# Patient Record
Sex: Male | Born: 1965 | Race: Black or African American | Hispanic: No | Smoking: Current every day smoker
Health system: Southern US, Community
[De-identification: ages and names within clinical notes are randomized; demographics above are authoritative.]

## PROBLEM LIST (undated history)

## (undated) DIAGNOSIS — I10 Essential (primary) hypertension: Secondary | ICD-10-CM

## (undated) DIAGNOSIS — G4730 Sleep apnea, unspecified: Secondary | ICD-10-CM

## (undated) DIAGNOSIS — F99 Mental disorder, not otherwise specified: Secondary | ICD-10-CM

## (undated) DIAGNOSIS — R569 Unspecified convulsions: Secondary | ICD-10-CM

## (undated) DIAGNOSIS — F419 Anxiety disorder, unspecified: Secondary | ICD-10-CM

## (undated) DIAGNOSIS — I469 Cardiac arrest, cause unspecified: Secondary | ICD-10-CM

## (undated) DIAGNOSIS — I219 Acute myocardial infarction, unspecified: Secondary | ICD-10-CM

## (undated) DIAGNOSIS — J189 Pneumonia, unspecified organism: Secondary | ICD-10-CM

## (undated) DIAGNOSIS — F4310 Post-traumatic stress disorder, unspecified: Secondary | ICD-10-CM

## (undated) DIAGNOSIS — K859 Acute pancreatitis without necrosis or infection, unspecified: Secondary | ICD-10-CM

## (undated) DIAGNOSIS — M6282 Rhabdomyolysis: Secondary | ICD-10-CM

## (undated) DIAGNOSIS — F209 Schizophrenia, unspecified: Secondary | ICD-10-CM

## (undated) DIAGNOSIS — N179 Acute kidney failure, unspecified: Secondary | ICD-10-CM

## (undated) DIAGNOSIS — R0602 Shortness of breath: Secondary | ICD-10-CM

## (undated) HISTORY — PX: TONSILLECTOMY: SUR1361

---

## 1998-12-04 ENCOUNTER — Emergency Department (HOSPITAL_COMMUNITY): Admission: EM | Admit: 1998-12-04 | Discharge: 1998-12-04 | Payer: Self-pay | Admitting: Emergency Medicine

## 2000-06-16 ENCOUNTER — Ambulatory Visit (HOSPITAL_COMMUNITY): Admission: RE | Admit: 2000-06-16 | Discharge: 2000-06-17 | Payer: Self-pay | Admitting: *Deleted

## 2000-06-16 HISTORY — PX: UVULOPALATOPHARYNGOPLASTY, TONSILLECTOMY AND SEPTOPLASTY: SHX2632

## 2006-09-15 ENCOUNTER — Encounter: Admission: RE | Admit: 2006-09-15 | Discharge: 2006-09-15 | Payer: Self-pay | Admitting: Occupational Medicine

## 2007-09-18 ENCOUNTER — Emergency Department (HOSPITAL_COMMUNITY): Admission: EM | Admit: 2007-09-18 | Discharge: 2007-09-18 | Payer: Self-pay | Admitting: Emergency Medicine

## 2007-10-21 ENCOUNTER — Emergency Department: Payer: Self-pay | Admitting: Emergency Medicine

## 2007-11-08 ENCOUNTER — Emergency Department (HOSPITAL_COMMUNITY): Admission: EM | Admit: 2007-11-08 | Discharge: 2007-11-09 | Payer: Self-pay | Admitting: Emergency Medicine

## 2011-03-04 NOTE — Op Note (Signed)
[place]. [facility]  Patient:    Ross [name], Ross [name]                  MRN: [id number] Proc. Date: 06/16/00 Adm. Date:  [id number] Disc. Date: [id number] Attending:  Carlena Sax CCArvella Merles, M.D.   Operative Report  PREOPERATIVE DIAGNOSES:  Obstructive sleep apnea, nasal airway obstruction, nasal septal deviation, inferior turbinate hypertrophy, tonsillar hypertrophy and elongated soft palate and uvula.  POSTOPERATIVE DIAGNOSIS:  Obstructive sleep apnea, nasal airway obstruction, nasal septal deviation, inferior turbinate hypertrophy, tonsillar hypertrophy and elongated soft palate and uvula.  PROCEDURE:  Tonsillectomy and uvulopalatopharyngoplasty, nasal septal reconstruction, intramural cauterization of both inferior turbinates.  SURGEON:  General endotracheal anesthesia.  INDICATIONS FOR SURGERY:  This is a 45 year old black male with a history of mild obstructive sleep apnea diagnosed by sleep test performed on August 21, 1997.  He has been using nasal CPAP for the past 6 months but has not tolerated it very well and has stopped using this.  He does complain of loud nasal congestion and loud snoring at night.  Physical examination does reveal a septal deviation right, congestion of the inferior turbinates, moderate to severe tonsillar hypertrophy and elongated soft palate and uvula.  Based on his history and physical examination I have recommended proceeding with the above noted surgical procedure.  I have discussed extensively with him the risks and benefits of surgery including risks of general anesthesia, infection and bleeding, uvulopharyngeal insufficiency and possible septal perforation and a normal recovery period after this type of surgery.  I have entertained questions, answered them appropriately and informed consent has been obtained and patient presents for the above noted procedure.  OPERATIVE FINDINGS:  Septal  deviation right, inferior turbinate congestion hypertrophy, moderate tonsillar hypertrophy, and elongated soft palate and uvula.  DETAILS OF PROCEDURE PERFORMED:  The patient was brought to the operating room and placed in supine position.  General endotracheal anesthesia administered via the anesthesiologist without complication.  The patient was administered 1 gram of Ancef IV x 1 and 10 mg of Decadron IV x 1.   The head of the table is turned 90 degrees.  The patients face is draped in standard fashion.  A Crowe-Davis mouth retractor inserted in the oral cavity.  This was used to retract the mouth open.  First attention was turned to the right tonsil.  A curved Allis clamp used to grasp the tonsil and retracted medially.  A Harmonic scalpel was then used to dissect the tonsil free from the tonsillar fossa and bleeding was controlled with a combination of suction cautery and harmonic scalpel without difficulty.  The tonsil was then transected at the tongue base and removed through the oral cavity and sent to surgical pathology for permanent section analysis.  Next, the attention was turned to the left tonsil.  Identical procedure was carried on this side compared to the right side with identical results.  After this was done bleeding from both tonsillar fossas was controlled meticulously with suction cautery without difficulty.  The uvula and portion of the soft palate was then transected horizontally in such a fashion leaving a soft palate behind to prevent uvulopharyngeal insufficiency.  This was done in a fashion to make a posterior based trap door flap using the harmonic scalpel without difficulty.  The uvula and a portion of the soft palate was then removed and sent to surgical pathology for permanent section analysis.  The cut edges of the soft palate and anterior and posterior tonsillar pillars were reapproximated with interrupted 3-0 Vicryl suture.  A total of 5 cc of 0.50%  Marcaine solution and 1:200,000 epinephrine were infiltrated in the soft palate, anterior tonsillar pillar and tongue base bilaterally.  A throat pack was placed in the hypopharynx and the Crowe-Davis mouth retractor was released and brought out through the oral cavity without incident.  Next, attention was turned to the nasal chamber.   Both sides of the septum were packed and the nasal chambers were packed with cottonoid soaked in some 4% cocaine solution.  These were left in place for approximately 5 to 10 minutes and then removed.  Both sides of the septum were infiltrated with a 1% lidocaine solution with 1:100,000 epinephrine.  After awaiting approximately 10 minutes a standard Killian incision was made on the left side of the septum.  Mucoperichondrium and mucoperiosteal flap was elevated on the left side using both blunt and sharp dissection.  A intercartilaginous incision was made approximately 1 cm posterior from the caudal end of the septum.  Mucoperichondrium and mucoperiosteal flap was elevated on the right side using both blunt and sharp dissection.  Cartilaginous deviation was removed with a Ballenger swivel knife.  The posterior bony deflection was removed with a Jansen-Middleton forceps and the septal spur which was pushing off of the left nasal chamber was removed with the Jansen-Middleton forceps without difficulty.  A piece of trimmed morcellized cartilage was placed in between the septal flaps.  Septal incision was closed with interrupted chromic suture and the septums reinforced with a running transeptal plain gut mattress stitch.  Both inferior turbinates were injected with a total of 6 cc of a 1% lidocaine solution and 1:100,000 epinephrine.  Both inferior turbinates were then intramurally cauterized using the Elmed intermedullary cauterization unit set at a 12 watt setting.  Three passes of both inferior turbinates was performed without difficulty and both  inferior turbinates were outfractured using gentle lateral pressure with a large nasal speculum.   A Doyle splint soaked in a Bactroban ointment solution was placed on either side of the septum and held in place with a transeptal Prolene suture.  Throat pack was removed, and the orogastric tube was placed and this was used to decompress the stomach contents and it was then removed without incident.  FLUIDS GIVEN DURING PROCEDURE:  Approximately 1 liter of crystalloid.  ESTIMATED BLOOD LOSS:  Less than 50 cc.  URINE:  Not measured.  DRAINS:  No drains.  SPLINTS:  Two Doyle splints were placed.  SPECIMENS SENT:  Septal cartilage and bone for gross pathology only, tonsils x 2, uvula and a portion of soft palate.  The patient tolerated the procedure well without complications and was extubated in the operating room and transferred to recovery room in stable condition.  Sponge, instrument and needle counts correct at the end of the procedure.  TOTAL DURATION OF PROCEDURE:  Approximately 2 hours.  The patient will be admitted for overnight recovery.  If he has recovered well he will be sent home on June 17, 2000.  He will be sent home on Augmentin elixir p.o. t.i.d. for 2 weeks and Lortab elixir 250 cc with 2 refills, 10-15 cc p.o. q.4h. p.r.n. pain.  He is to have light activity.  No heavy lifting or nose blowing for 2 weeks and a post tonsillar diet for 2 weeks.  Both him and his family are given oral and written instructions.  They are to call if he has any problems with bleeding, fever, or vomiting, pain, reaction to medications or any other questions.  He will followup for splint removal on Thursday, June 22, 2000, at 3:50 p.m. DD:  06/16/00 TD:  06/17/00 Job: [id number]

## 2011-08-23 ENCOUNTER — Inpatient Hospital Stay (HOSPITAL_COMMUNITY)
Admission: EM | Admit: 2011-08-23 | Discharge: 2011-09-01 | DRG: 917 | Disposition: A | Discharge status: Home Health | Payer: Non-veteran care | Attending: Internal Medicine | Admitting: Internal Medicine

## 2011-08-23 ENCOUNTER — Inpatient Hospital Stay (HOSPITAL_COMMUNITY): Payer: Non-veteran care

## 2011-08-23 ENCOUNTER — Encounter (HOSPITAL_COMMUNITY): Payer: Self-pay | Admitting: Anesthesiology

## 2011-08-23 ENCOUNTER — Other Ambulatory Visit: Payer: Self-pay

## 2011-08-23 ENCOUNTER — Encounter: Payer: Self-pay | Admitting: *Deleted

## 2011-08-23 DIAGNOSIS — T443X1A Poisoning by other parasympatholytics [anticholinergics and antimuscarinics] and spasmolytics, accidental (unintentional), initial encounter: Principal | ICD-10-CM | POA: Diagnosis present

## 2011-08-23 DIAGNOSIS — J9600 Acute respiratory failure, unspecified whether with hypoxia or hypercapnia: Secondary | ICD-10-CM | POA: Diagnosis present

## 2011-08-23 DIAGNOSIS — G9349 Other encephalopathy: Secondary | ICD-10-CM | POA: Diagnosis present

## 2011-08-23 DIAGNOSIS — I472 Ventricular tachycardia, unspecified: Secondary | ICD-10-CM | POA: Diagnosis present

## 2011-08-23 DIAGNOSIS — D6959 Other secondary thrombocytopenia: Secondary | ICD-10-CM | POA: Diagnosis present

## 2011-08-23 DIAGNOSIS — I469 Cardiac arrest, cause unspecified: Secondary | ICD-10-CM

## 2011-08-23 DIAGNOSIS — R0902 Hypoxemia: Secondary | ICD-10-CM

## 2011-08-23 DIAGNOSIS — R21 Rash and other nonspecific skin eruption: Secondary | ICD-10-CM | POA: Diagnosis present

## 2011-08-23 DIAGNOSIS — L309 Dermatitis, unspecified: Secondary | ICD-10-CM | POA: Insufficient documentation

## 2011-08-23 DIAGNOSIS — J9690 Respiratory failure, unspecified, unspecified whether with hypoxia or hypercapnia: Secondary | ICD-10-CM

## 2011-08-23 DIAGNOSIS — I4901 Ventricular fibrillation: Secondary | ICD-10-CM

## 2011-08-23 DIAGNOSIS — R739 Hyperglycemia, unspecified: Secondary | ICD-10-CM | POA: Diagnosis present

## 2011-08-23 DIAGNOSIS — D696 Thrombocytopenia, unspecified: Secondary | ICD-10-CM | POA: Diagnosis present

## 2011-08-23 DIAGNOSIS — L259 Unspecified contact dermatitis, unspecified cause: Secondary | ICD-10-CM | POA: Diagnosis present

## 2011-08-23 DIAGNOSIS — E876 Hypokalemia: Secondary | ICD-10-CM | POA: Diagnosis not present

## 2011-08-23 DIAGNOSIS — F4310 Post-traumatic stress disorder, unspecified: Secondary | ICD-10-CM | POA: Diagnosis present

## 2011-08-23 DIAGNOSIS — D649 Anemia, unspecified: Secondary | ICD-10-CM | POA: Diagnosis present

## 2011-08-23 DIAGNOSIS — R509 Fever, unspecified: Secondary | ICD-10-CM | POA: Diagnosis not present

## 2011-08-23 DIAGNOSIS — I498 Other specified cardiac arrhythmias: Secondary | ICD-10-CM | POA: Diagnosis present

## 2011-08-23 DIAGNOSIS — G9340 Encephalopathy, unspecified: Secondary | ICD-10-CM | POA: Diagnosis present

## 2011-08-23 DIAGNOSIS — E870 Hyperosmolality and hypernatremia: Secondary | ICD-10-CM | POA: Diagnosis not present

## 2011-08-23 DIAGNOSIS — G931 Anoxic brain damage, not elsewhere classified: Secondary | ICD-10-CM | POA: Diagnosis present

## 2011-08-23 DIAGNOSIS — R9431 Abnormal electrocardiogram [ECG] [EKG]: Secondary | ICD-10-CM | POA: Diagnosis present

## 2011-08-23 DIAGNOSIS — I10 Essential (primary) hypertension: Secondary | ICD-10-CM | POA: Diagnosis present

## 2011-08-23 DIAGNOSIS — R569 Unspecified convulsions: Secondary | ICD-10-CM | POA: Diagnosis present

## 2011-08-23 DIAGNOSIS — G4733 Obstructive sleep apnea (adult) (pediatric): Secondary | ICD-10-CM | POA: Diagnosis present

## 2011-08-23 DIAGNOSIS — IMO0002 Reserved for concepts with insufficient information to code with codable children: Secondary | ICD-10-CM | POA: Diagnosis present

## 2011-08-23 DIAGNOSIS — I4729 Other ventricular tachycardia: Secondary | ICD-10-CM | POA: Diagnosis present

## 2011-08-23 DIAGNOSIS — T50901A Poisoning by unspecified drugs, medicaments and biological substances, accidental (unintentional), initial encounter: Secondary | ICD-10-CM

## 2011-08-23 DIAGNOSIS — R7309 Other abnormal glucose: Secondary | ICD-10-CM | POA: Diagnosis present

## 2011-08-23 HISTORY — DX: Sleep apnea, unspecified: G47.30

## 2011-08-23 HISTORY — DX: Mental disorder, not otherwise specified: F99

## 2011-08-23 HISTORY — DX: Unspecified convulsions: R56.9

## 2011-08-23 HISTORY — DX: Cardiac arrest, cause unspecified: I46.9

## 2011-08-23 LAB — SALICYLATE LEVEL: Salicylate Lvl: <2 mg/dL — ABNORMAL LOW (ref 2.8–20.0)

## 2011-08-23 LAB — BASIC METABOLIC PANEL
BUN: 7 mg/dL (ref 6–23)
Calcium: 10.2 mg/dL (ref 8.4–10.5)
Creatinine, Ser: 0.91 mg/dL (ref 0.50–1.35)
GFR calc non Af Amer: >90 mL/min (ref >90)
Glucose, Bld: 157 mg/dL — ABNORMAL HIGH (ref 70–99)

## 2011-08-23 LAB — CBC
HCT: 43.1 % (ref 39.0–52.0)
Hemoglobin: 14.2 g/dL (ref 13.0–17.0)
MCH: 30.1 pg (ref 26.0–34.0)
MCH: 29.7 pg (ref 26.0–34.0)
MCHC: 33.9 g/dL (ref 30.0–36.0)
MCHC: 32.9 g/dL (ref 30.0–36.0)
MCV: 90.2 fL (ref 78.0–100.0)
Platelets: 136 10*3/uL — ABNORMAL LOW (ref 150–400)
RDW: 15.2 % (ref 11.5–15.5)

## 2011-08-23 LAB — POCT I-STAT, CHEM 8
BUN: 5 mg/dL — ABNORMAL LOW (ref 6–23)
Calcium, Ion: 1.08 mmol/L — ABNORMAL LOW (ref 1.12–1.32)
Chloride: 107 mEq/L (ref 96–112)
Creatinine, Ser: 1.1 mg/dL (ref 0.50–1.35)
Glucose, Bld: 160 mg/dL — ABNORMAL HIGH (ref 70–99)
HCT: 47 % (ref 39.0–52.0)
Hemoglobin: 16 g/dL (ref 13.0–17.0)
Potassium: 3.4 mEq/L — ABNORMAL LOW (ref 3.5–5.1)
Sodium: 138 mEq/L (ref 135–145)
TCO2: 18 mmol/L (ref 0–100)

## 2011-08-23 LAB — HEPATIC FUNCTION PANEL
Albumin: 3.6 g/dL (ref 3.5–5.2)
Alkaline Phosphatase: 138 U/L — ABNORMAL HIGH (ref 39–117)
Total Protein: 7.8 g/dL (ref 6.0–8.3)

## 2011-08-23 LAB — DIFFERENTIAL
Basophils Relative: 0 % (ref 0–1)
Eosinophils Absolute: 0 10*3/uL (ref 0.0–0.7)
Eosinophils Relative: 1 % (ref 0–5)
Neutrophils Relative %: 88 % — ABNORMAL HIGH (ref 43–77)

## 2011-08-23 LAB — RAPID URINE DRUG SCREEN, HOSP PERFORMED
Barbiturates: NOT DETECTED
Cocaine: NOT DETECTED
Opiates: NOT DETECTED

## 2011-08-23 LAB — APTT: aPTT: 26 seconds (ref 24–37)

## 2011-08-23 LAB — ACETAMINOPHEN LEVEL: Acetaminophen (Tylenol), Serum: <15 ug/mL (ref 10–30)

## 2011-08-23 MED ORDER — POTASSIUM CHLORIDE 20 MEQ/15ML (10%) PO LIQD
ORAL | Status: AC
  Filled 2011-08-23: qty 30

## 2011-08-23 MED ORDER — MIDAZOLAM HCL 5 MG/ML IJ SOLN
INTRAMUSCULAR | Status: AC
  Filled 2011-08-23: qty 1

## 2011-08-23 MED ORDER — SODIUM CHLORIDE 0.9 % IV SOLN
0.2000 ug/kg/h | INTRAVENOUS | Status: DC
  Administered 2011-08-23: 0.4 ug/kg/h via INTRAVENOUS
  Filled 2011-08-23 (×3): qty 2

## 2011-08-23 MED ORDER — SODIUM CHLORIDE 0.9 % IV SOLN
50.0000 ug/h | INTRAVENOUS | Status: DC
  Administered 2011-08-23: 50 ug/h via INTRAVENOUS
  Administered 2011-08-24: 150 ug/h via INTRAVENOUS
  Filled 2011-08-23 (×2): qty 50

## 2011-08-23 MED ORDER — HEPARIN SODIUM (PORCINE) 5000 UNIT/ML IJ SOLN
5000.0000 [IU] | Freq: Three times a day (TID) | INTRAMUSCULAR | Status: DC
  Administered 2011-08-24 – 2011-09-01 (×24): 5000 [IU] via SUBCUTANEOUS
  Filled 2011-08-23 (×33): qty 1

## 2011-08-23 MED ORDER — LORAZEPAM 2 MG/ML IJ SOLN
INTRAMUSCULAR | Status: AC
  Administered 2011-08-23: 2 mg via INTRAVENOUS
  Filled 2011-08-23: qty 1

## 2011-08-23 MED ORDER — METHYLPREDNISOLONE SODIUM SUCC 125 MG IJ SOLR
125.0000 mg | Freq: Once | INTRAMUSCULAR | Status: AC
  Administered 2011-08-23: 125 mg via INTRAVENOUS
  Filled 2011-08-23: qty 2

## 2011-08-23 MED ORDER — FAMOTIDINE IN NACL 20-0.9 MG/50ML-% IV SOLN
20.0000 mg | Freq: Two times a day (BID) | INTRAVENOUS | Status: DC
  Administered 2011-08-23: 20 mg via INTRAVENOUS
  Filled 2011-08-23 (×3): qty 50

## 2011-08-23 MED ORDER — SODIUM CHLORIDE 0.9 % IV SOLN
2.0000 mg/h | INTRAVENOUS | Status: DC
  Administered 2011-08-23: 3 mg/h via INTRAVENOUS
  Filled 2011-08-23 (×2): qty 10

## 2011-08-23 MED ORDER — FAMOTIDINE IN NACL 20-0.9 MG/50ML-% IV SOLN
20.0000 mg | Freq: Two times a day (BID) | INTRAVENOUS | Status: DC
  Filled 2011-08-23 (×2): qty 50

## 2011-08-23 MED ORDER — POTASSIUM CHLORIDE CRYS ER 20 MEQ PO TBCR
EXTENDED_RELEASE_TABLET | ORAL | Status: AC
  Administered 2011-08-23: 40 meq via ORAL
  Filled 2011-08-23: qty 2

## 2011-08-23 MED ORDER — SODIUM CHLORIDE 0.9 % IJ SOLN: 3.0000 mL | INTRAMUSCULAR | Status: AC

## 2011-08-23 MED ORDER — CALAMINE EX LOTN
TOPICAL_LOTION | CUTANEOUS | Status: DC | PRN
  Administered 2011-08-30: 12:00:00 via TOPICAL
  Filled 2011-08-23 (×2): qty 118

## 2011-08-23 MED ORDER — MIDAZOLAM HCL 5 MG/ML IJ SOLN
5.0000 mg | INTRAMUSCULAR | Status: DC | PRN
  Administered 2011-08-23 (×2): 5 mg via INTRAVENOUS
  Filled 2011-08-23: qty 2

## 2011-08-23 MED ORDER — OXYCODONE HCL 5 MG PO TABS: 5.0000 mg | ORAL_TABLET | ORAL | Status: DC | PRN

## 2011-08-23 MED ORDER — MIDAZOLAM BOLUS VIA INFUSION
1.0000 mg | INTRAVENOUS | Status: DC | PRN
  Filled 2011-08-23: qty 2

## 2011-08-23 MED ORDER — FENTANYL CITRATE 0.05 MG/ML IJ SOLN
25.0000 ug | INTRAMUSCULAR | Status: DC | PRN
Start: 2011-08-23 — End: 2011-08-27
  Administered 2011-08-23 – 2011-08-24 (×3): 100 ug via INTRAVENOUS
  Filled 2011-08-23: qty 4
  Filled 2011-08-23: qty 2

## 2011-08-23 MED ORDER — MIDAZOLAM HCL 5 MG/ML IJ SOLN
2.0000 mg | INTRAMUSCULAR | Status: DC | PRN
  Administered 2011-08-23: 3 mg via INTRAVENOUS
  Administered 2011-08-23: 2 mg via INTRAVENOUS

## 2011-08-23 MED ORDER — SODIUM CHLORIDE 0.9 % IV SOLN
2.0000 g | Freq: Once | INTRAVENOUS | Status: DC
  Filled 2011-08-23: qty 20

## 2011-08-23 MED ORDER — ONDANSETRON HCL 4 MG/2ML IJ SOLN: 4.0000 mg | Freq: Four times a day (QID) | INTRAMUSCULAR | Status: DC | PRN

## 2011-08-23 MED ORDER — MAGNESIUM SULFATE BOLUS VIA INFUSION: 4.0000 g | Freq: Once | INTRAVENOUS | Status: DC

## 2011-08-23 MED ORDER — SODIUM CHLORIDE 0.9 % IV BOLUS (SEPSIS): 1000.0000 mL | Freq: Once | INTRAVENOUS | Status: DC

## 2011-08-23 MED ORDER — METHYLPREDNISOLONE SODIUM SUCC 125 MG IJ SOLR
80.0000 mg | Freq: Three times a day (TID) | INTRAMUSCULAR | Status: DC
  Administered 2011-08-23 – 2011-08-25 (×6): 80 mg via INTRAVENOUS
  Filled 2011-08-23 (×14): qty 2

## 2011-08-23 MED ORDER — SODIUM CHLORIDE 0.9 % IV BOLUS (SEPSIS)
1000.0000 mL | Freq: Once | INTRAVENOUS | Status: AC
  Administered 2011-08-23: 1000 mL via INTRAVENOUS

## 2011-08-23 MED ORDER — METHYLPREDNISOLONE SODIUM SUCC 40 MG IJ SOLR
INTRAMUSCULAR | Status: AC
  Administered 2011-08-23: 80 mg via INTRAVENOUS
  Filled 2011-08-23: qty 2

## 2011-08-23 MED ORDER — CLONIDINE HCL 0.1 MG PO TABS
0.1000 mg | ORAL_TABLET | Freq: Two times a day (BID) | ORAL | Status: DC
  Administered 2011-08-23 – 2011-08-27 (×8): 0.1 mg via ORAL
  Filled 2011-08-23 (×15): qty 1

## 2011-08-23 MED ORDER — SENNA 8.6 MG PO TABS
2.0000 | ORAL_TABLET | Freq: Every day | ORAL | Status: DC | PRN
  Filled 2011-08-23: qty 2

## 2011-08-23 MED ORDER — LORAZEPAM 2 MG/ML IJ SOLN
2.0000 mg | INTRAMUSCULAR | Status: DC | PRN
  Administered 2011-08-23 – 2011-08-27 (×4): 2 mg via INTRAVENOUS
  Filled 2011-08-23 (×3): qty 1

## 2011-08-23 MED ORDER — FENTANYL CITRATE 0.05 MG/ML IJ SOLN: 25.0000 ug | INTRAMUSCULAR | Status: DC | PRN

## 2011-08-23 MED ORDER — ACETAMINOPHEN 325 MG PO TABS: 650.0000 mg | ORAL_TABLET | Freq: Four times a day (QID) | ORAL | Status: DC | PRN

## 2011-08-23 MED ORDER — FAMOTIDINE IN NACL 20-0.9 MG/50ML-% IV SOLN
20.0000 mg | Freq: Two times a day (BID) | INTRAVENOUS | Status: DC
  Administered 2011-08-24: 20 mg via INTRAVENOUS
  Filled 2011-08-23 (×3): qty 50

## 2011-08-23 MED ORDER — POTASSIUM CHLORIDE CRYS ER 20 MEQ PO TBCR
40.0000 meq | EXTENDED_RELEASE_TABLET | Freq: Two times a day (BID) | ORAL | Status: DC
  Administered 2011-08-23 (×2): 40 meq via ORAL
  Filled 2011-08-23 (×3): qty 2

## 2011-08-23 MED ORDER — FENTANYL CITRATE 0.05 MG/ML IJ SOLN
INTRAMUSCULAR | Status: AC
  Filled 2011-08-23: qty 2

## 2011-08-23 MED ORDER — MAGNESIUM SULFATE 50 % IJ SOLN
3.0000 g | Freq: Once | INTRAVENOUS | Status: DC
  Filled 2011-08-23: qty 6

## 2011-08-23 MED ORDER — ALUM & MAG HYDROXIDE-SIMETH 200-200-20 MG/5ML PO SUSP: 30.0000 mL | Freq: Four times a day (QID) | ORAL | Status: DC | PRN

## 2011-08-23 MED ORDER — ACETAMINOPHEN 650 MG RE SUPP: 650.0000 mg | Freq: Four times a day (QID) | RECTAL | Status: DC | PRN

## 2011-08-23 MED ORDER — ONDANSETRON HCL 4 MG PO TABS
4.0000 mg | ORAL_TABLET | Freq: Four times a day (QID) | ORAL | Status: DC | PRN
  Filled 2011-08-23: qty 1

## 2011-08-23 MED ORDER — FENTANYL BOLUS VIA INFUSION
50.0000 ug | Freq: Four times a day (QID) | INTRAVENOUS | Status: DC | PRN
  Administered 2011-08-23: 100 ug via INTRAVENOUS
  Filled 2011-08-23: qty 100

## 2011-08-23 MED ORDER — SODIUM CHLORIDE 0.9 % IV SOLN
INTRAVENOUS | Status: DC
  Administered 2011-08-23 (×2): via INTRAVENOUS

## 2011-08-23 MED FILL — Medication: Qty: 1 | Status: AC

## 2011-08-23 NOTE — ED Notes (Signed)
When double clicking on pt name, it clicked off the EEG.  Dr. Darnelle Catalan was paged so she will be notified to order another EEG because EEG was not done.

## 2011-08-23 NOTE — ED Notes (Signed)
Pt c/o severe itching in areas hives are present. Rama, MD paged, sts pt cannot receive any additional steriod, anti-itching medications at this time. Sts calamine lotion may be ordered from pharmacy and pt may use that as often as needed. Lotion ordered. Pt made comfortable in order to eat dinner. Family remains at bedside. Awaiting bed assignment. Will continue to monitor.

## 2011-08-23 NOTE — Progress Notes (Signed)
eLink Physician-Brief Progress Note Patient Name: SLADEN PLANCARTE DOB: [birth year] MRN: [id number]  Date of Service  08/23/2011   HPI/Events of Note   Call from bed 1521 at [facility] by Dr Karma Ganja ER doc. Patient had V Fib arrest. Shock x 3 with ROSC and pulse. BP check in progress. Intubated. Gag and groan but unrespinse  eICU Interventions  1. Start ice cold saline stat 2. Transfer to [place] ICU -> Dr Kendrick Fries CCM bedside doc to admit and stabilize-> then transfer to [address] [facility] for arctic sun (care link called)   Intervention Category Major Interventions: Code management / supervision Minor Interventions: Communication with other healthcare providers and/or family  [place] Basham 08/23/2011, 10:02 PM

## 2011-08-23 NOTE — ED Notes (Signed)
When pt stood up from [facility] pt almost fell but was able to catch himself, assisted pt back to stretcher.  Family at bedside. Reoriented pt on how to use call bell system.

## 2011-08-23 NOTE — ED Notes (Signed)
Patient had seizure lasting approx 1 min 42 secs. Dr. Freida Busman called to bedside. Order for Ativan given. Patient's speech garbled; states "I just want to go home." Padded side rails applied for patient safety.

## 2011-08-23 NOTE — ED Notes (Signed)
Poison control called for update on patient's status.

## 2011-08-23 NOTE — ED Notes (Signed)
EEG called to inform RN that pt would not be seen until tomorrow morning.

## 2011-08-23 NOTE — ED Notes (Signed)
Care of pt assumed. Pt given breakfast tray. Okay'ed to eat by Freida Busman, EDP. Family at bedside. Awaiting bed assignment. Will continue to monitor.

## 2011-08-23 NOTE — ED Notes (Signed)
Wife states that she found patient walking in the home around 0200. States gait was unsteady and that patient fell with positive LOC. States did not strike head on anything. Patient had Hydroxyzine 25mg  (30 Qty) filled on 08/22/11; patient has 20 pills left. Patient may have also had an unknown amount of Benadryl liquid.

## 2011-08-23 NOTE — Progress Notes (Signed)
Name: Ross [name] MRN: [id number] DOB: 06-30-1966  DOS:   08/23/11   CRITICAL CARE MEDICINE ADMISSION / CONSULTATION NOTE  Referring Physician [facility]  Reason For Consult  Cardiac Arrest 45 y/o male was admitted to [facility] on 11/6 after having a seizure at home.  Had been taking benadryl for a rash.  Admitted to floor, seized again night of 11/6 in front of family, then went into cardiac arrest.   Patient Description  45 y/o male was admitted to [facility] on 11/6 after having a seizure at home.  Had been taking benadryl for a rash.  Admitted to floor, seized again night of 11/6 in front of family, then went into cardiac arrest.      Location Start Stop        Culture Date Result       Antibiotic Indication Start Stop        GI Prophylaxis DVT Prophylaxis  pepcid Sub q hep   Protocols  Vent stabilization Precedex   Consultants  08/23/11 Cardiology     Date Imaging Studies      Date Events        History Of Present Illness   45 y/o male was admitted to [facility] on 11/6 after having a seizure at home.  Had been taking benadryl for a rash.  Admitted to floor, seized again night of 11/6 in front of family, then went into cardiac arrest. He went into [place], was shocked, was transiently bradycardic, then went into V-fib and was defribrillated again.  He received epinephrine x1 and atropine. After arrival to the ICU, he was combative and tried to speak, making purposeful movements.  He was sedated.  QTc  Past Medical History  Diagnosis Date  . Sleep apnea     had surgery to correct it  . Mental disorder     PTSD  . Seizures     currently dx with seizures    Past Surgical History  Procedure Date  . Tonsillectomy   . Uvulopalatopharyngoplasty, tonsillectomy and septoplasty 06/16/2000    Prior to Admission medications   Medication Sig Start Date End Date Taking? Authorizing Provider  cetaphil (CETAPHIL) cream Apply topically as needed. Apply after bathing    Yes  Historical Provider, MD  diphenhydrAMINE (BENADRYL) 25 MG tablet Take 25 mg by mouth every 6 (six) hours as needed.     Yes Historical Provider, MD  gentamicin (GARAMYCIN) 0.1 % ointment Apply 1 application topically. After bathing    Yes Historical Provider, MD  hydrOXYzine (ATARAX/VISTARIL) 25 MG tablet Take 25 mg by mouth every 6 (six) hours as needed.    Yes Historical Provider, MD  loratadine (CLARITIN) 10 MG tablet Take 10 mg by mouth daily.     Yes Historical Provider, MD    No Known Allergies  Family History  Problem Relation Age of Onset  . Heart attack Mother     Social History  reports that he has never smoked. He quit smokeless tobacco use about 3 years ago. He reports that he drinks about .5 ounces of alcohol per week. He reports that he does not use illicit drugs.  Review Of Systems   Cannot obtain due to intubation  Physical Examination  BP 129/83  Pulse 110  Temp(Src) 98.2 F (36.8 C) (Oral)  Resp 10  Ht 5\' 10"  (1.778 m)  Wt 106.595 kg (235 lb)  BMI 33.72 kg/m2  SpO2 98%  Gen: Intubated HEENT: NCAT, PERRL, EOMi, OP clear, neck supple without masses  PULM: CTA B CV: RRR, no mgr, no JVD AB: BS+, soft, nontender, no hsm Ext: warm, no edema, no clubbing, no cyanosis Derm: rash noted throughout neck, desquamating Neuro: Sedated on vent, when not sedated: wakes up, moves all four extremities, attempts to talk  Labs  Results for orders placed during the hospital encounter of 08/23/11 (from the past 24 hour(s))  GLUCOSE, CAPILLARY     Status: Abnormal   Collection Time   08/23/11  4:08 AM      Component Value Range   Glucose-Capillary 156 (*) 70 - 99 (mg/dL)  BASIC METABOLIC PANEL     Status: Abnormal   Collection Time   08/23/11  4:49 AM      Component Value Range   Sodium 136  135 - 145 (mEq/L)   Potassium 3.7  3.5 - 5.1 (mEq/L)   Chloride 96  96 - 112 (mEq/L)   CO2 17 (*) 19 - 32 (mEq/L)   Glucose, Bld 157 (*) 70 - 99 (mg/dL)   BUN 7  6 - 23 (mg/dL)     Creatinine, Ser 1.61  0.50 - 1.35 (mg/dL)   Calcium 09.6  8.4 - 10.5 (mg/dL)   GFR calc non Af Amer >90  >90 (mL/min)   GFR calc Af Amer >90  >90 (mL/min)  CBC     Status: Abnormal   Collection Time   08/23/11  4:49 AM      Component Value Range   WBC 5.9  4.0 - 10.5 (K/uL)   RBC 4.79  4.22 - 5.81 (MIL/uL)   Hemoglobin 14.4  13.0 - 17.0 (g/dL)   HCT 04.5  40.9 - 81.1 (%)   MCV 88.7  78.0 - 100.0 (fL)   MCH 30.1  26.0 - 34.0 (pg)   MCHC 33.9  30.0 - 36.0 (g/dL)   RDW 91.4  78.2 - 95.6 (%)   Platelets 136 (*) 150 - 400 (K/uL)  DIFFERENTIAL     Status: Abnormal   Collection Time   08/23/11  4:49 AM      Component Value Range   Neutrophils Relative 88 (*) 43 - 77 (%)   Neutro Abs 5.2  1.7 - 7.7 (K/uL)   Lymphocytes Relative 6 (*) 12 - 46 (%)   Lymphs Abs 0.4 (*) 0.7 - 4.0 (K/uL)   Monocytes Relative 5  3 - 12 (%)   Monocytes Absolute 0.3  0.1 - 1.0 (K/uL)   Eosinophils Relative 1  0 - 5 (%)   Eosinophils Absolute 0.0  0.0 - 0.7 (K/uL)   Basophils Relative 0  0 - 1 (%)   Basophils Absolute 0.0  0.0 - 0.1 (K/uL)  HEMOGLOBIN A1C     Status: Normal   Collection Time   08/23/11  4:49 AM      Component Value Range   Hemoglobin A1C 5.6  <5.7 (%)   Mean Plasma Glucose 114  <117 (mg/dL)  ACETAMINOPHEN LEVEL     Status: Normal   Collection Time   08/23/11  4:49 AM      Component Value Range   Acetaminophen (Tylenol), Serum <15.0  10 - 30 (ug/mL)  SALICYLATE LEVEL     Status: Abnormal   Collection Time   08/23/11  4:49 AM      Component Value Range   Salicylate Lvl <2.0 (*) 2.8 - 20.0 (mg/dL)  CK     Status: Normal   Collection Time   08/23/11  4:49 AM      Component  Value Range   Total CK 164  7 - 232 (U/L)  HEPATIC FUNCTION PANEL     Status: Abnormal   Collection Time   08/23/11  4:49 AM      Component Value Range   Total Protein 7.8  6.0 - 8.3 (g/dL)   Albumin 3.6  3.5 - 5.2 (g/dL)   AST 161 (*) 0 - 37 (U/L)   ALT 63 (*) 0 - 53 (U/L)   Alkaline Phosphatase 138 (*) 39 - 117  (U/L)   Total Bilirubin 0.4  0.3 - 1.2 (mg/dL)   Bilirubin, Direct 0.1  0.0 - 0.3 (mg/dL)   Indirect Bilirubin 0.3  0.3 - 0.9 (mg/dL)  POCT I-STAT, CHEM 8     Status: Abnormal   Collection Time   08/23/11  5:07 AM      Component Value Range   Sodium 138  135 - 145 (mEq/L)   Potassium 3.4 (*) 3.5 - 5.1 (mEq/L)   Chloride 107  96 - 112 (mEq/L)   BUN 5 (*) 6 - 23 (mg/dL)   Creatinine, Ser 0.96  0.50 - 1.35 (mg/dL)   Glucose, Bld 045 (*) 70 - 99 (mg/dL)   Calcium, Ion 4.09 (*) 1.12 - 1.32 (mmol/L)   TCO2 18  0 - 100 (mmol/L)   Hemoglobin 16.0  13.0 - 17.0 (g/dL)   HCT 81.1  91.4 - 78.2 (%)  URINE RAPID DRUG SCREEN (HOSP PERFORMED)     Status: Normal   Collection Time   08/23/11  5:13 AM      Component Value Range   Opiates NONE DETECTED  NONE DETECTED    Cocaine NONE DETECTED  NONE DETECTED    Benzodiazepines NONE DETECTED  NONE DETECTED    Amphetamines NONE DETECTED  NONE DETECTED    Tetrahydrocannabinol NONE DETECTED  NONE DETECTED    Barbiturates NONE DETECTED  NONE DETECTED   CARDIAC PANEL(CRET KIN+CKTOT+MB+TROPI)     Status: Abnormal (Preliminary result)   Collection Time   08/23/11 10:05 PM      Component Value Range   Total CK 323 (*) 7 - 232 (U/L)   CK, MB 3.9  0.3 - 4.0 (ng/mL)   Troponin I PENDING  <0.30 (ng/mL)   Relative Index 1.2  0.0 - 2.5   CBC     Status: Abnormal   Collection Time   08/23/11 10:05 PM      Component Value Range   WBC 11.7 (*) 4.0 - 10.5 (K/uL)   RBC 4.78  4.22 - 5.81 (MIL/uL)   Hemoglobin 14.2  13.0 - 17.0 (g/dL)   HCT 95.6  21.3 - 08.6 (%)   MCV 90.2  78.0 - 100.0 (fL)   MCH 29.7  26.0 - 34.0 (pg)   MCHC 32.9  30.0 - 36.0 (g/dL)   RDW 57.8  46.9 - 62.9 (%)   Platelets 131 (*) 150 - 400 (K/uL)  MAGNESIUM     Status: Normal   Collection Time   08/23/11 10:05 PM      Component Value Range   Magnesium 1.9  1.5 - 2.5 (mg/dL)  PHOSPHORUS     Status: Normal   Collection Time   08/23/11 10:05 PM      Component Value Range   Phosphorus 3.4  2.3  - 4.6 (mg/dL)    Imaging CXR: ETT and R subclavian CVL in place  Assessment Patient Active Hospital Problem List:  Cardiac Arrest: Assessment: Likely due to either hypoxemia in setting of seizure or  due to QTc prolongation.  DDx includes primary cardiac event.  Plan: -cards consult -cycle enzymes -2D TTE  QTc Prolongation Assessment: Likely due to benadryl  Plan: -mg now -ca now -bicarb gtt now  Respiratory failure: Assessment: insetting of cardiac arrest Plan: -full vent support -abg now and in am -CXR in am -SBT/WUA daily  Seizure (08/23/2011)   Assessment: Uncertain etiology, possibly due to benadryl overdose?  Currently showing purposefull movements, but uncomfortable.  Doesn't need cooling protocol but needs sedation.   Plan:  -prn benzodiazepines -frequent neuro checks -fentanyl gtt and precedex, RASS -2 -WUA daily  Poisoning by anticholinergic drug (08/23/2011)   Assessment: Poison control notified.   Plan: -no anticholinergics -start bicarb infusion  Papular rash, generalized (08/23/2011)   Assessment: Uncertain etiology    Plan:  -Derm consult after extubation  Best Practice Feeding: NPO Analgesia: fentanyl Sedation: precedex Thromboprophylaxis:sub q hep HOB >30 degrees Ulcer prophylaxis: pepcid Glucose control: monitor CBG   Max Fickle, MD 08/23/2011, 11:17 PM   Critical Care Time:

## 2011-08-23 NOTE — ED Notes (Signed)
Contacted [employer]. Recommendations are to observe for QRS widening, IV fluids and Benzodiazepines for seizure activity. Maintain cardiac monitoring; repeat EKG in 4 hours. If seizures continue, may give Phenobarbital not Dilantin. Dr. Freida Busman notified of recommendations.

## 2011-08-23 NOTE — ED Notes (Addendum)
I brought the patient a bedside commode and assisted him in getting on it. I gave him the call light and asked him to please push the button when he was done. He agreed. About 5 minutes later while coming out of another patients room I heard what sounded like a fall. NT Mauldin and I ran into the room to find that the patient was in a standing position and his wife was standing in front of him. The wife stated, "he almost fell". This is the second time I've walked into the room and the patient and his wife are standing at the bedside. The first time was after assisting him to the bedside commode the first time. I had asked him to press the call bell when ready on that occasion as well.

## 2011-08-23 NOTE — Progress Notes (Signed)
Central Venous Catheter Insertion Procedure Note Ross [name] [id number] [birth year]  Procedure: Insertion of Central Venous Catheter Indications: Assessment of intravascular volume  Procedure Details Consent: Risks of procedure as well as the alternatives and risks of each were explained to the (patient/caregiver).  Consent for procedure obtained. Time Out: Verified patient identification, verified procedure, site/side was marked, verified correct patient position, special equipment/implants available, medications/allergies/relevent history reviewed, required imaging and test results available.  Performed  Maximum sterile technique was used including antiseptics, cap, gloves, gown, hand hygiene, mask and sheet. Skin prep: Chlorhexidine; local anesthetic administered A antimicrobial bonded/coated triple lumen catheter was placed in the right subclavian vein using the Seldinger technique.  Evaluation Blood flow good Complications: No apparent complications Patient did tolerate procedure well. Chest X-ray ordered to verify placement.  CXR: normal.  MCQUAID, DOUGLAS 08/23/2011, 11:16 PM

## 2011-08-23 NOTE — ED Notes (Signed)
I-Stat chem 8 collected and resulted, pt ID number scanned instead of pt MRN number therefore lab is not crossing over to EPIC. MD Freida Busman shown chem8 result with hard copy of lab. Edit sheet faxed to [facility]

## 2011-08-23 NOTE — ED Notes (Signed)
Patient given ginger ale. 

## 2011-08-23 NOTE — ED Notes (Signed)
Pt has been taking hydroxyzine and benadryl for allergic reaction. Pt was hyperactive then became suddenly very sleepy and poorly arousable for wife. EMS were able to arouse him w/ verbal stimuli and repositioning. Pt's VS prior to arrival critically high. CBG 167 for EMS. Denies other drug and ETOH use.

## 2011-08-23 NOTE — ED Notes (Signed)
ZOX:[id number]<AV> Expected date:08/23/11<BR> Expected time: 3:30 AM<BR> Means of arrival:Ambulance<BR> Comments:<BR> gc 241. 45 yo m. od on rx meds. Initially responds to verval now caox4. Hydroxyzine+bendryl. approx 15+min eta

## 2011-08-23 NOTE — ED Notes (Signed)
Pt's wife out to nurses station, stating pt having seizure activity. This RN and Brittney, RN in with pt and witnessed last few seconds of tonic-clonic seizure activity. Pt appeared post-ictal, confused, lethargic, disoriented. Pt now alert and oriented, placed on cardiac monitoring again. Ativan 2mg  IVP administered per prn orders.

## 2011-08-23 NOTE — H&P (Addendum)
PCP: [facility]   DOA:  08/23/2011  4:01 AM  Chief Complaint:  Seizure  HPI: Ross [name] is a 45 year old male with a past medical history of eczema. He has seen a dermatologist for this in the past. The patient has developed a contact dermatitis triggered by changing soap. He subsequently developed a diffuse body rash and his [facility] physician prescribed him Vistaril.  The patient has taken 10 (ten) 25 mg capsules of Vistaril over the past 24 hours and also has taken a bottle of over-the-counter liquid Benadryl. He subsequently developed tremulousness and muscle jerks. His wife brought him to the hospital for evaluation where he was observed to have a tonic-clonic seizure. The patient subsequently was referred to the hospitalist service for further evaluation and observation.  Allergies: No Known Allergies  Prior to Admission medications   Medication Sig Start Date End Date Taking? Authorizing Provider  diphenhydrAMINE (BENADRYL) 25 MG tablet Take 25 mg by mouth every 6 (six) hours as needed.     Yes Historical Provider, MD  hydrOXYzine (ATARAX/VISTARIL) 25 MG tablet Take 25 mg by mouth every 6 (six) hours as needed.    Yes Historical Provider, MD  loratadine (CLARITIN) 10 MG tablet Take 10 mg by mouth daily.     Yes Historical Provider, MD   Past medical history: 1.  Obstructive sleep apnea  2.  Eczema  3.  Posttraumatic stress disorder  Past surgical history: 1.  tonsillectomy, uvulopalatopharyngeoplasty, nasal septal reconstruction, intramural cauterization of both inferior turbinates.  Social History:  The patient is married. His wife's name is Ross [name] and she can be reached at [phone]. The patient states that he quit smoking approximately one year ago but prior to this only smoked about 4 cigarettes per day. He drinks approximately 2 alcoholic beverages per day with his last intake yesterday. He is disabled secondary to his history of posttraumatic stress  disorder but previously was employed in transportation.  Family history: The patient's parents are both deceased. His mother died in her mid 62s from renal failure. She also had a history of diabetes. The patient's father died in his mid-60s from an MI. He is an only child with no siblings and he has one healthy son.  Review of Systems:  Review of Systems - General ROS: positive for  - Normal appetite Negative for fever, chills, weight changes. Psychological ROS: positive for - anxiety and PTSD Ophthalmic ROS: negative for - blurry vision ENT ROS: negative for - oral lesions or lip/tongue swelling, dysphagia Allergy and Immunology ROS: positive for - hives and seasonal allergies Respiratory ROS: negative for - cough or shortness of breath Cardiovascular ROS: no chest pain or dyspnea on exertion positive for - rapid heart rate and palpitations with anxiety related to PTSD Gastrointestinal ROS: no abdominal pain, change in bowel habits, or black or bloody stools positive for - nausea/vomiting x 3 in past 24 hours. Genito-Urinary ROS: no dysuria, trouble voiding, or hematuria Musculoskeletal ROS: negative Neurological ROS: no TIA or stroke symptoms negative for - headache. Positive for tremors, seizure. Dermatological ROS: positive for eczema, pruritus and rash  Physical Exam:  Filed Vitals:   08/23/11 0411 08/23/11 0430 08/23/11 0500 08/23/11 0603  BP: 174/108 171/110 169/114 176/113  Pulse: 120 115 114 128  Temp:    101.1 F (38.4 C)  TempSrc:    Rectal  Resp:   21   Weight: 104.327 kg (230 lb)     SpO2: 98% 100% 98% 97%  Constitutional: Vital signs reviewed.  Patient is a well-developed and well-nourished in no acute distress and cooperative with exam. Alert and oriented x3.  Head: Normocephalic and atraumatic Mouth: no erythema or exudates, dry mucous membranes with white coating to the tongue. Eyes: PERRL, EOMI, conjunctivae normal, No scleral icterus.  Neck: Supple,  Trachea midline normal ROM, No JVD, mass, thyromegaly, or carotid bruit present.  Cardiovascular: RRR, S1 normal, S2 normal, no MRG, pulses symmetric and intact bilaterally Pulmonary/Chest: CTAB, no wheezes, rales, or rhonchi Abdominal: Soft. Non-tender, non-distended, bowel sounds are normal, no masses, organomegaly, or guarding present.  GU: no CVA tenderness Musculoskeletal: No joint deformities, erythema, or stiffness, ROM full and no nontender Ext: no edema and no cyanosis, pulses palpable bilaterally (DP and PT) Neurological: A&O x3, Strenght is normal and symmetric bilaterally, cranial nerve II-XII are grossly intact, no focal motor deficit, sensory intact to light touch bilaterally. Tremulous. Skin: Warm, dry and intact. Diffuse and erythematous papular rash. Psychiatric: Depressed mood and flat affect. speech and behavior is normal.  Labs on Admission:  Results for orders placed during the hospital encounter of 08/23/11 (from the past 48 hour(s))  GLUCOSE, CAPILLARY     Status: Abnormal   Collection Time   08/23/11  4:08 AM      Component Value Range Comment   Glucose-Capillary 156 (*) 70 - 99 (mg/dL)   BASIC METABOLIC PANEL     Status: Abnormal   Collection Time   08/23/11  4:49 AM      Component Value Range Comment   Sodium 136  135 - 145 (mEq/L)    Potassium 3.7  3.5 - 5.1 (mEq/L)    Chloride 96  96 - 112 (mEq/L)    CO2 17 (*) 19 - 32 (mEq/L)    Glucose, Bld 157 (*) 70 - 99 (mg/dL)    BUN 7  6 - 23 (mg/dL)    Creatinine, Ser 1.61  0.50 - 1.35 (mg/dL)    Calcium 09.6  8.4 - 10.5 (mg/dL)    GFR calc non Af Amer >90  >90 (mL/min)    GFR calc Af Amer >90  >90 (mL/min)   CBC     Status: Abnormal   Collection Time   08/23/11  4:49 AM      Component Value Range Comment   WBC 5.9  4.0 - 10.5 (K/uL)    RBC 4.79  4.22 - 5.81 (MIL/uL)    Hemoglobin 14.4  13.0 - 17.0 (g/dL)    HCT 04.5  40.9 - 81.1 (%)    MCV 88.7  78.0 - 100.0 (fL)    MCH 30.1  26.0 - 34.0 (pg)    MCHC 33.9   30.0 - 36.0 (g/dL)    RDW 91.4  78.2 - 95.6 (%)    Platelets 136 (*) 150 - 400 (K/uL)   DIFFERENTIAL     Status: Abnormal   Collection Time   08/23/11  4:49 AM      Component Value Range Comment   Neutrophils Relative 88 (*) 43 - 77 (%)    Neutro Abs 5.2  1.7 - 7.7 (K/uL)    Lymphocytes Relative 6 (*) 12 - 46 (%)    Lymphs Abs 0.4 (*) 0.7 - 4.0 (K/uL)    Monocytes Relative 5  3 - 12 (%)    Monocytes Absolute 0.3  0.1 - 1.0 (K/uL)    Eosinophils Relative 1  0 - 5 (%)    Eosinophils Absolute 0.0  0.0 - 0.7 (  K/uL)    Basophils Relative 0  0 - 1 (%)    Basophils Absolute 0.0  0.0 - 0.1 (K/uL)   URINE RAPID DRUG SCREEN (HOSP PERFORMED)     Status: Normal   Collection Time   08/23/11  5:13 AM      Component Value Range Comment   Opiates NONE DETECTED  NONE DETECTED     Cocaine NONE DETECTED  NONE DETECTED     Benzodiazepines NONE DETECTED  NONE DETECTED     Amphetamines NONE DETECTED  NONE DETECTED     Tetrahydrocannabinol NONE DETECTED  NONE DETECTED     Barbiturates NONE DETECTED  NONE DETECTED      Radiological Exams on Admission: No results found.  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 Poison control has are ready been notified. Recommendations are to use benzodiazepines for any seizure activity. We will therefore prescribe Ativan for use when necessary seizure activity or excessive tremulousness. We'll check a total CK level to rule out rhabdomyolysis.  Active Problems:  Poisoning by anticholinergic drug: The patient will be observed on a telemetry floor. We will also check Tylenol and salicylate levels to rule out any coingestions.  We will monitor him with continuous pulse oximetry and if he develops any signs of arrhythmia or an elevated QRS, we'll start him on a sodium bicarbonate drip.  Papular rash, generalized: This does not appear to be secondary to his known history of eczema. It is intensely pruritic and diffuse. I will  start him on Solu-Medrol and Pepcid and taper this as his symptoms improve.  HTN (hypertension), malignant: The patient does not have a known history of high blood pressure so this may simply be a side effect of anticholinergic poisoning. We'll use clonidine and monitor his blood pressure closely.  Hyperglycemia: The patient does not have a history of diabetes. This is not a fasting sample. We'll check a fasting glucose in the morning and also a hemoglobin A1c to determine his overall glycemic control.  Thrombocytopenia: This is mild and we will monitor this.   Time Spent on Admission: Approximately 1 hour.  RAMA,CHRISTINA 08/23/2011, 8:07 AM

## 2011-08-23 NOTE — Code Documentation (Signed)
Called to Room 1521 for Code Blue. Upon my arrival pt receiving CPR and BVM by staff.  Rhythm check and pt in vib, no pulse.  Defibrillated total of 3 times with CPR continued in between.  Pt given epi 1mg  IV x 1.   Amiodarone 300mg  IV ordered, but not given. Between 2nd defibrillation pt in slow PEA ryhtym.  At that time atropine 1mg  IV given. After 3rd defibrillation attempt, pt regained NSR with 2+ femoral and carotid pulses.  Pt intubated by nurse anesthetist during code.  Bilateral breath sounds ascultated with symmetric chest rise.  I d/w Dr. Marchelle Gearing, PCCM- he recommends starting cold NS and pt to be transferred to ICU.  D/w [facility] hospitalist NP on floor- she is calling her physician counterpart.  PCCM is sending someone to floor as well.  Pt agitated after resuscitation, given versed 2mg  IV x 2 doses.  Pt with HR 154, BP 170/94.  Moving all extremities.  Pt transferred to ICU in critical condition

## 2011-08-23 NOTE — ED Notes (Signed)
Pt given dinner tray.

## 2011-08-23 NOTE — ED Provider Notes (Signed)
History     CSN: [id number] Arrival date & time: 08/23/2011  4:01 AM   First MD Initiated Contact with Patient 08/23/11 [phone]      Chief Complaint  Patient presents with  . Altered Mental Status  . Drug Overdose    (Consider location/radiation/quality/duration/timing/severity/associated sxs/prior treatment) Patient is a 45 y.o. male presenting with altered mental status and Overdose. The history is provided by the patient and the spouse.  Altered Mental Status  Drug Overdose   patient to extra doses of Benadryl and hydroxyzine last night due to persistent rash. Patient was seen at the [facility] clinic and given a prescription for Vistaril. Due to increased itching he also took over-the-counter Benadryl. His wife found him tonight confused and weak. Patient denies suicidal attempt.  Patient describes rash as being all over his body, pruritic was with flaking. Patient states that he did use a new body soap 2 weeks ago and that the symptoms started afterwards. Denies any mucosal involvement  No past medical history on file.  No past surgical history on file.  No family history on file.  History  Substance Use Topics  . Smoking status: Not on file  . Smokeless tobacco: Not on file  . Alcohol Use: Not on file      Review of Systems  Psychiatric/Behavioral: Positive for altered mental status.  All other systems reviewed and are negative.    Allergies  Review of patient's allergies indicates no known allergies.  Home Medications   Current Outpatient Rx  Name Route Sig Dispense Refill  . DIPHENHYDRAMINE HCL 25 MG PO TABS Oral Take 25 mg by mouth every 6 (six) hours as needed.      Marland Kitchen HYDROXYZINE HCL 25 MG PO TABS Oral Take 25 mg by mouth every 6 (six) hours as needed.     Marland Kitchen LORATADINE 10 MG PO TABS Oral Take 10 mg by mouth daily.        BP 171/110  Pulse 115  Temp(Src) 98.3 F (36.8 C) (Oral)  Resp 18  Wt 230 lb (104.327 kg)  SpO2 100%  Physical Exam  Nursing note and  vitals reviewed. Constitutional: He is oriented to person, place, and time. He appears well-developed and well-nourished.  Non-toxic appearance. No distress.  HENT:  Head: Normocephalic and atraumatic.  Eyes: Conjunctivae and EOM are normal. Pupils are equal, round, and reactive to light.  Neck: Normal range of motion. Neck supple. No tracheal deviation present.  Cardiovascular: Regular rhythm and normal heart sounds.  Bradycardia present.  Exam reveals no gallop.   No murmur heard. Pulmonary/Chest: Effort normal and breath sounds normal. No stridor. No respiratory distress. He has no wheezes.  Abdominal: Soft. Normal appearance and bowel sounds are normal. He exhibits no distension. There is no tenderness. There is no rebound.  Musculoskeletal: Normal range of motion. He exhibits no edema and no tenderness.  Neurological: He is alert and oriented to person, place, and time. He has normal strength. No cranial nerve deficit or sensory deficit. GCS eye subscore is 4. GCS verbal subscore is 5. GCS motor subscore is 6.  Skin: Skin is warm and dry. Rash noted. No purpura noted. Rash is macular and urticarial. Rash is not pustular and not vesicular.  Psychiatric: He has a normal mood and affect. His speech is normal and behavior is normal.    ED Course  Procedures (including critical care time)  Labs Reviewed  GLUCOSE, CAPILLARY - Abnormal; Notable for the following:    Glucose-Capillary 156 (*)  All other components within normal limits  POCT CBG MONITORING  I-STAT, CHEM 8   No results found.   No diagnosis found.    MDM   Date: 08/23/2011  Rate: 120  Rhythm: sinus tachycardia  QRS Axis: normal  Intervals: normal  ST/T Wave abnormalities: nonspecific ST/T changes  Conduction Disutrbances:none  Narrative Interpretation:   Old EKG Reviewed: none available       5:14 AM Patient had a seizure which was tonic-clonic likely from his anticholinergic overdose. Patient given  Ativan 2 mg IV push. Anticipate patient will need inpatient hospitalization  CRITICAL CARE Performed by: Toy Baker   Total critical care time:70  Critical care time was exclusive of separately billable procedures and treating other patients.  Critical care was necessary to treat or prevent imminent or life-threatening deterioration.  Critical care was time spent personally by me on the following activities: development of treatment plan with patient and/or surrogate as well as nursing, discussions with consultants, evaluation of patient's response to treatment, examination of patient, obtaining history from patient or surrogate, ordering and performing treatments and interventions, ordering and review of laboratory studies, ordering and review of radiographic studies, pulse oximetry and re-evaluation of patient's condition.  Toy Baker, MD 08/23/11 [phone]

## 2011-08-24 ENCOUNTER — Encounter (HOSPITAL_COMMUNITY)
Admission: EM | Disposition: A | Discharge status: Home Health | Payer: Self-pay | Source: Home / Self Care | Attending: Critical Care Medicine

## 2011-08-24 ENCOUNTER — Other Ambulatory Visit: Payer: Self-pay

## 2011-08-24 ENCOUNTER — Inpatient Hospital Stay (HOSPITAL_COMMUNITY): Payer: Non-veteran care

## 2011-08-24 ENCOUNTER — Other Ambulatory Visit (HOSPITAL_COMMUNITY): Payer: Self-pay

## 2011-08-24 DIAGNOSIS — I472 Ventricular tachycardia, unspecified: Secondary | ICD-10-CM

## 2011-08-24 DIAGNOSIS — R569 Unspecified convulsions: Secondary | ICD-10-CM

## 2011-08-24 HISTORY — PX: LEFT HEART CATHETERIZATION WITH CORONARY ANGIOGRAM: SHX5451

## 2011-08-24 LAB — POCT I-STAT 3, ART BLOOD GAS (G3+)
Acid-base deficit: 1 mmol/L (ref 0.0–2.0)
Bicarbonate: 22.1 mEq/L (ref 20.0–24.0)
O2 Saturation: 92 %
Patient temperature: 37
TCO2: 23 mmol/L (ref 0–100)
pCO2 arterial: 41.1 mmHg (ref 35.0–45.0)
pH, Arterial: 7.304 — ABNORMAL LOW (ref 7.350–7.450)
pH, Arterial: 7.378 (ref 7.350–7.450)
pO2, Arterial: 217 mmHg — ABNORMAL HIGH (ref 80.0–100.0)
pO2, Arterial: 65 mmHg — ABNORMAL LOW (ref 80.0–100.0)

## 2011-08-24 LAB — BLOOD GAS, ARTERIAL
MECHVT: 500 mL
RATE: 16 resp/min
TCO2: 23.2 mmol/L (ref 0–100)
pCO2 arterial: 43 mmHg (ref 35.0–45.0)
pH, Arterial: 7.327 — ABNORMAL LOW (ref 7.350–7.450)
pO2, Arterial: 120 mmHg — ABNORMAL HIGH (ref 80.0–100.0)

## 2011-08-24 LAB — CBC
HCT: 38.6 % — ABNORMAL LOW (ref 39.0–52.0)
HCT: 37.9 % — ABNORMAL LOW (ref 39.0–52.0)
Hemoglobin: 12.9 g/dL — ABNORMAL LOW (ref 13.0–17.0)
Hemoglobin: 12.9 g/dL — ABNORMAL LOW (ref 13.0–17.0)
MCH: 29.5 pg (ref 26.0–34.0)
MCHC: 33.4 g/dL (ref 30.0–36.0)
MCHC: 32.8 g/dL (ref 30.0–36.0)
MCV: 88.7 fL (ref 78.0–100.0)
MCV: 89.7 fL (ref 78.0–100.0)
Platelets: 107 10*3/uL — ABNORMAL LOW (ref 150–400)
RBC: 4.28 MIL/uL (ref 4.22–5.81)
RDW: 15.3 % (ref 11.5–15.5)
WBC: 9.9 10*3/uL (ref 4.0–10.5)
WBC: 9.3 10*3/uL (ref 4.0–10.5)

## 2011-08-24 LAB — DIFFERENTIAL
Basophils Relative: 0 % (ref 0–1)
Eosinophils Relative: 0 % (ref 0–5)
Monocytes Absolute: 0.7 10*3/uL (ref 0.1–1.0)
Monocytes Relative: 7 % (ref 3–12)
Neutro Abs: 9.1 10*3/uL — ABNORMAL HIGH (ref 1.7–7.7)

## 2011-08-24 LAB — BASIC METABOLIC PANEL
BUN: 8 mg/dL (ref 6–23)
BUN: 11 mg/dL (ref 6–23)
CO2: 23 mEq/L (ref 19–32)
Calcium: 8.7 mg/dL (ref 8.4–10.5)
Chloride: 104 mEq/L (ref 96–112)
Creatinine, Ser: 0.9 mg/dL (ref 0.50–1.35)
Creatinine, Ser: 1.09 mg/dL (ref 0.50–1.35)
GFR calc Af Amer: >90 mL/min (ref >90)
GFR calc non Af Amer: >90 mL/min (ref >90)
Glucose, Bld: 180 mg/dL — ABNORMAL HIGH (ref 70–99)
Potassium: 3.4 mEq/L — ABNORMAL LOW (ref 3.5–5.1)

## 2011-08-24 LAB — MAGNESIUM: Magnesium: 2.3 mg/dL (ref 1.5–2.5)

## 2011-08-24 LAB — GLUCOSE, CAPILLARY
Glucose-Capillary: 159 mg/dL — ABNORMAL HIGH (ref 70–99)
Glucose-Capillary: 164 mg/dL — ABNORMAL HIGH (ref 70–99)
Glucose-Capillary: 181 mg/dL — ABNORMAL HIGH (ref 70–99)
Glucose-Capillary: 185 mg/dL — ABNORMAL HIGH (ref 70–99)

## 2011-08-24 LAB — CARDIAC PANEL(CRET KIN+CKTOT+MB+TROPI)
Relative Index: 1.2 (ref 0.0–2.5)
Total CK: 323 U/L — ABNORMAL HIGH (ref 7–232)
Troponin I: 0.91 ng/mL (ref <0.30)

## 2011-08-24 LAB — PROTIME-INR
INR: 0.99 (ref 0.00–1.49)
Prothrombin Time: 13.3 seconds (ref 11.6–15.2)

## 2011-08-24 SURGERY — LEFT HEART CATHETERIZATION WITH CORONARY ANGIOGRAM
Anesthesia: LOCAL

## 2011-08-24 MED ORDER — NITROGLYCERIN 0.2 MG/ML ON CALL CATH LAB
INTRAVENOUS | Status: AC
  Filled 2011-08-24: qty 1

## 2011-08-24 MED ORDER — POTASSIUM CHLORIDE 20 MEQ/15ML (10%) PO LIQD
40.0000 meq | Freq: Two times a day (BID) | ORAL | Status: DC
  Administered 2011-08-24: 20 meq via NASOGASTRIC
  Administered 2011-08-24 – 2011-08-27 (×6): 40 meq
  Filled 2011-08-24 (×10): qty 30

## 2011-08-24 MED ORDER — ASPIRIN 81 MG PO CHEW
324.0000 mg | CHEWABLE_TABLET | Freq: Once | ORAL | Status: AC
  Administered 2011-08-24: 324 mg via NASOGASTRIC

## 2011-08-24 MED ORDER — LIDOCAINE HCL (PF) 1 % IJ SOLN
INTRAMUSCULAR | Status: AC
  Filled 2011-08-24: qty 30

## 2011-08-24 MED ORDER — ASPIRIN 300 MG RE SUPP
300.0000 mg | RECTAL | Status: AC
  Filled 2011-08-24: qty 1

## 2011-08-24 MED ORDER — FENTANYL BOLUS VIA INFUSION
50.0000 ug | Freq: Four times a day (QID) | INTRAVENOUS | Status: DC | PRN
  Filled 2011-08-24: qty 100

## 2011-08-24 MED ORDER — SODIUM CHLORIDE 0.9 % IV SOLN
INTRAVENOUS | Status: DC
  Administered 2011-08-24: 04:00:00 via INTRAVENOUS

## 2011-08-24 MED ORDER — ASPIRIN 81 MG PO CHEW
CHEWABLE_TABLET | ORAL | Status: AC
  Filled 2011-08-24: qty 2

## 2011-08-24 MED ORDER — FENTANYL CITRATE 0.05 MG/ML IJ SOLN
50.0000 ug/h | INTRAMUSCULAR | Status: DC
  Administered 2011-08-24: 50 ug/h via INTRAVENOUS
  Administered 2011-08-25: 150 ug/h via INTRAVENOUS
  Administered 2011-08-26: 100 ug/h via INTRAVENOUS
  Filled 2011-08-24 (×3): qty 50

## 2011-08-24 MED ORDER — SODIUM CHLORIDE 0.9 % IV SOLN
0.4000 ug/kg/h | INTRAVENOUS | Status: DC
  Administered 2011-08-24 (×3): 0.7 ug/kg/h via INTRAVENOUS
  Filled 2011-08-24 (×3): qty 4

## 2011-08-24 MED ORDER — SODIUM CHLORIDE 0.9 % IV SOLN
1.0000 mL/kg/h | INTRAVENOUS | Status: DC
  Administered 2011-08-24: 1 mL/kg/h via INTRAVENOUS

## 2011-08-24 MED ORDER — CHLORHEXIDINE GLUCONATE 0.12 % MT SOLN
15.0000 mL | Freq: Two times a day (BID) | OROMUCOSAL | Status: DC
  Administered 2011-08-24 – 2011-08-27 (×7): 15 mL via OROMUCOSAL
  Filled 2011-08-24 (×10): qty 15

## 2011-08-24 MED ORDER — ASPIRIN 81 MG PO CHEW
324.0000 mg | CHEWABLE_TABLET | ORAL | Status: AC
  Administered 2011-08-24: 324 mg via ORAL
  Filled 2011-08-24: qty 4

## 2011-08-24 MED ORDER — METHYLPREDNISOLONE SODIUM SUCC 125 MG IJ SOLR
INTRAMUSCULAR | Status: AC
  Administered 2011-08-24: 80 mg via INTRAVENOUS
  Filled 2011-08-24: qty 2

## 2011-08-24 MED ORDER — BIOTENE DRY MOUTH MT LIQD
15.0000 mL | OROMUCOSAL | Status: DC
  Administered 2011-08-24 – 2011-08-29 (×30): 15 mL via OROMUCOSAL

## 2011-08-24 MED ORDER — POTASSIUM CHLORIDE 20 MEQ/15ML (10%) PO LIQD
ORAL | Status: AC
  Administered 2011-08-24: 20 meq via NASOGASTRIC
  Filled 2011-08-24: qty 30

## 2011-08-24 MED ORDER — SODIUM CHLORIDE 0.9 % IV SOLN
250.0000 mL | INTRAVENOUS | Status: DC | PRN
  Administered 2011-08-24: 250 mL via INTRAVENOUS

## 2011-08-24 MED ORDER — PROPOFOL 10 MG/ML IV EMUL
5.0000 ug/kg/min | INTRAVENOUS | Status: DC
  Administered 2011-08-24: 5 ug/kg/min via INTRAVENOUS
  Administered 2011-08-24 (×2): 30 ug/kg/min via INTRAVENOUS
  Filled 2011-08-24 (×2): qty 100

## 2011-08-24 MED ORDER — FUROSEMIDE 10 MG/ML IJ SOLN
40.0000 mg | Freq: Once | INTRAMUSCULAR | Status: AC
  Administered 2011-08-24: 40 mg via INTRAVENOUS
  Filled 2011-08-24: qty 4

## 2011-08-24 MED ORDER — HEPARIN (PORCINE) IN NACL 2-0.9 UNIT/ML-% IJ SOLN
INTRAMUSCULAR | Status: AC
  Filled 2011-08-24: qty 2000

## 2011-08-24 MED ORDER — ASPIRIN 81 MG PO CHEW
CHEWABLE_TABLET | ORAL | Status: AC
  Filled 2011-08-24: qty 4

## 2011-08-24 MED ORDER — PROPOFOL 10 MG/ML IV EMUL
5.0000 ug/kg/min | INTRAVENOUS | Status: DC
  Administered 2011-08-24: 30 ug/kg/min via INTRAVENOUS

## 2011-08-24 MED ORDER — SODIUM BICARBONATE 8.4 % IV SOLN
INTRAVENOUS | Status: DC
  Administered 2011-08-24: 03:00:00 via INTRAVENOUS
  Filled 2011-08-24 (×3): qty 150

## 2011-08-24 NOTE — Progress Notes (Signed)
eLink Physician-Brief Progress Note Patient Name: Ross [name] DOB: [birth year] MRN: [id number]  Date of Service  08/24/2011   HPI/Events of Note   rn and rt calling Worsening hypoxemia to 50% fio2 Vent dysnchrony + Worsening rash on face and forehead  eICU Interventions  cxr stat abg stat Give prn fentanyl and reassess   Intervention Category Major Interventions: Respiratory failure - evaluation and management Intermediate Interventions: Communication with other healthcare providers and/or family;Change in mental status - evaluation and management Minor Interventions: Communication with other healthcare providers and/or family  [name] 08/24/2011, 6:39 PM

## 2011-08-24 NOTE — Progress Notes (Signed)
eLink Physician-Brief Progress Note Patient Name: Ross [name] DOB: 1966/07/26 MRN: [id number]  Date of Service  08/24/2011   HPI/Events of Note  cxr worsening air space disease correlating with worsening hypoxemia. Cath suggested huigh LVEDP  eICU Interventions  D/w Dr Tyson Alias 1. Change to cntinous sedation gtt with diprivan and fentanyl 2. Diurese x 1   Intervention Category Major Interventions: Respiratory failure - evaluation and management Intermediate Interventions: Communication with other healthcare providers and/or family;Diagnostic test evaluation Minor Interventions: Communication with other healthcare providers and/or family  Christian Treadway 08/24/2011, 8:14 PM

## 2011-08-24 NOTE — Progress Notes (Signed)
20cc versed iv and 200cc fentanyl iv wasted in sink. Witnessed by 2 RN.  Christella Noa and Silas [place] Donyale Falcon

## 2011-08-24 NOTE — Consult Note (Signed)
Referring Physician: Dr. Darrick Penna - P/CCM Primary Cardiologist: none  Reason for Consultation: VT/VF arrest in setting of prolonged QT   HPI:   Ross [name] is a 45 year old male with a past medical history of eczema, OSA and PTSD. He has seen a dermatologist for this in the past. The patient has developed a contact dermatitis triggered by changing soap. He subsequently developed a diffuse body rash and his [facility] physician prescribed him hydroxyzine. The patient has taken 10 (ten) 25 mg capsules of Vistaril over the past 24 hours and also has taken most of a bottle of over-the-counter liquid Benadryl. He subsequently developed tremulousness and muscle jerks. His wife brought him to the hospital for evaluation where he was observed to have a tonic-clonic seizure. He was markedly hypertensive and febrile and diagnosed with anti-cholinergic toxicity. ECG with marked QT prolongation > . The patient subsequently was referred to the hospitalist service for further evaluation and observation.  While on the floor, experienced VT/VF arrest (no strips available although there are seveeal strips with NSVT due to R-on-T phenomenon) and was shocked 3 times and intubated. Plan was for [facility] protocol but he quickly was alert and following commands so he did not undergo cooling. Received 3g mag and calcium.  Now sedated. No h/o cardiac problems per chart.   ROS: Per chart. all other systems normal except as mentioned in HPI, past medical history and problem list.    Past Medical History  Diagnosis Date  . Sleep apnea     had surgery to correct it  . Mental disorder     PTSD  . Seizures     currently dx with seizures  . Cardiac arrest 08/23/2011    Medications Prior to Admission  Medication Dose Route Frequency Provider Last Rate Last Dose  . 0.9 %  sodium chloride infusion  250 mL Intravenous PRN Max Fickle, MD 20 mL/hr at 08/24/11 0500 250 mL at 08/24/11 0500  . 0.9 %  sodium  chloride infusion   Intravenous Continuous Max Fickle, MD 20 mL/hr at 08/24/11 0500    . acetaminophen (TYLENOL) tablet 650 mg  650 mg Oral Q6H PRN Christina Rama       Or  . acetaminophen (TYLENOL) suppository 650 mg  650 mg Rectal Q6H PRN Christina Rama      . aspirin chewable tablet 324 mg  324 mg Oral NOW Max Fickle, MD   324 mg at 08/24/11 0154   Or  . aspirin suppository 300 mg  300 mg Rectal NOW Max Fickle, MD      . calamine lotion   Topical PRN Hillery Aldo      . calcium gluconate 2 g in sodium chloride 0.9 % 100 mL IVPB  2 g Intravenous Once Max Fickle, MD      . cloNIDine (CATAPRES) tablet 0.1 mg  0.1 mg Oral BID Christina Rama   0.1 mg at 08/23/11 0926  . dexmedetomidine (PRECEDEX) 400 mcg in sodium chloride 0.9 % 100 mL infusion  0.4-1.2 mcg/kg/hr Intravenous Titrated Gary Fleet Abbott, PHARMD 18.7 mL/hr at 08/24/11 0500 0.7 mcg/kg/hr at 08/24/11 0500  . famotidine (PEPCID) IVPB 20 mg  20 mg Intravenous Q12H Reece Packer, PHARMD   20 mg at 08/24/11 0323  . fentaNYL (SUBLIMAZE) 0.05 MG/ML injection           . fentaNYL (SUBLIMAZE) 10 mcg/mL in sodium chloride 0.9 % 250 mL infusion  50-400 mcg/hr Intravenous Titrated Max Fickle, MD 10 mL/hr at  08/24/11 0500 100 mcg/hr at 08/24/11 0500   And  . fentaNYL (SUBLIMAZE) bolus via infusion 50-100 mcg  50-100 mcg Intravenous Q6H PRN Max Fickle, MD   100 mcg at 08/23/11 2253  . fentaNYL (SUBLIMAZE) injection 25-100 mcg  25-100 mcg Intravenous Q2H PRN Max Fickle, MD   100 mcg at 08/23/11 2305  . heparin injection 5,000 Units  5,000 Units Subcutaneous Q8H Max Fickle, MD      . LORazepam (ATIVAN) injection 2 mg  2 mg Intravenous Q4H PRN Christina Rama   2 mg at 08/23/11 1932  . magnesium sulfate 3 g in dextrose 5 % 100 mL IVPB  3 g Intravenous Once Max Fickle, MD      . methylPREDNISolone sodium succinate (SOLU-MEDROL) 125 MG injection 125 mg  125 mg Intravenous Once Toy Baker,  MD   125 mg at 08/23/11 0514  . methylPREDNISolone sodium succinate (SOLU-MEDROL) 125 MG injection 80 mg  80 mg Intravenous Q8H Christina Rama   80 mg at 08/23/11 1351  . midazolam (VERSED) 1 mg/mL in sodium chloride 0.9 % 50 mL infusion  2-10 mg/hr Intravenous Titrated Max Fickle, MD 1 mL/hr at 08/24/11 0500 1 mg/hr at 08/24/11 0500  . midazolam (VERSED) 5 MG/ML injection           . midazolam (VERSED) 5 MG/ML injection           . midazolam (VERSED) injection 5 mg  5 mg Intravenous Q1H PRN Max Fickle, MD   5 mg at 08/23/11 2306  . ondansetron (ZOFRAN) tablet 4 mg  4 mg Oral Q6H PRN Christina Rama       Or  . ondansetron (ZOFRAN) injection 4 mg  4 mg Intravenous Q6H PRN Christina Rama      . potassium chloride SA (K-DUR,KLOR-CON) CR tablet 40 mEq  40 mEq Oral BID Christina Rama   40 mEq at 08/23/11 1630  . senna (SENOKOT) tablet 17.2 mg  2 tablet Oral Daily PRN Christina Rama      . sodium bicarbonate 150 mEq in dextrose 5 % 1,000 mL infusion   Intravenous Continuous Elizabeth Deterding 100 mL/hr at 08/24/11 0500    . sodium chloride 0.9 % bolus 1,000 mL  1,000 mL Intravenous Once Toy Baker, MD   1,000 mL at 08/23/11 0509  . sodium chloride 0.9 % bolus 1,000 mL  1,000 mL Intravenous Once Kalman Shan, MD      . sodium chloride 0.9 % injection 3 mL  3 mL Intravenous STAT Toy Baker, MD      . DISCONTD: 0.9 %  sodium chloride infusion   Intravenous Continuous Christina Rama 100 mL/hr at 08/24/11 0300    . DISCONTD: alum & mag hydroxide-simeth (MAALOX/MYLANTA) 200-200-20 MG/5ML suspension 30 mL  30 mL Oral Q6H PRN Christina Rama      . DISCONTD: calcium gluconate 2 g in sodium chloride 0.9 % 100 mL IVPB  2 g Intravenous Once Max Fickle, MD      . DISCONTD: dexmedetomidine (PRECEDEX) 200 mcg in sodium chloride 0.9 % 50 mL infusion  0.2-1.2 mcg/kg/hr Intravenous Continuous Max Fickle, MD 18.7 mL/hr at 08/24/11 0100 0.7 mcg/kg/hr at 08/24/11 0100  . DISCONTD:  famotidine (PEPCID) IVPB 20 mg  20 mg Intravenous Q12H Christina Rama   20 mg at 08/23/11 0927  . DISCONTD: famotidine (PEPCID) IVPB 20 mg  20 mg Intravenous Q12H Max Fickle, MD      . DISCONTD: fentaNYL (SUBLIMAZE) injection 25-50 mcg  25-50 mcg Intravenous Q2H PRN Max Fickle, MD      . DISCONTD: magnesium bolus via infusion 4 g  4 g Intravenous Once Max Fickle, MD      . DISCONTD: midazolam (VERSED) 1 mg/mL bolus via infusion 1-2 mg  1-2 mg Intravenous Q2H PRN Max Fickle, MD      . DISCONTD: midazolam (VERSED) injection 2-4 mg  2-4 mg Intravenous Q2H PRN Max Fickle, MD   2 mg at 08/23/11 2249  . DISCONTD: oxyCODONE (Oxy IR/ROXICODONE) immediate release tablet 5 mg  5 mg Oral Q4H PRN Christina Rama      . DISCONTD: potassium chloride 20 MEQ/15ML (10%) liquid            No current outpatient prescriptions on file as of 08/24/2011.        Marland Kitchen aspirin  324 mg Oral NOW   Or  . aspirin  300 mg Rectal NOW  . calcium gluconate  2 g Intravenous Once  . cloNIDine  0.1 mg Oral BID  . famotidine (PEPCID) IV  20 mg Intravenous Q12H  . fentaNYL      . heparin subcutaneous  5,000 Units Subcutaneous Q8H  . magnesium sulfate IVPB  3 g Intravenous Once  . methylPREDNISolone (SOLU-MEDROL) injection  80 mg Intravenous Q8H  . midazolam      . midazolam      . potassium chloride  40 mEq Oral BID  . sodium chloride  1,000 mL Intravenous Once  . sodium chloride  3 mL Intravenous STAT  . DISCONTD: calcium gluconate  2 g Intravenous Once  . DISCONTD: famotidine (PEPCID) IV  20 mg Intravenous Q12H  . DISCONTD: famotidine (PEPCID) IV  20 mg Intravenous Q12H  . DISCONTD: magnesium  4 g Intravenous Once    Infusions:    . sodium chloride 20 mL/hr at 08/24/11 0500  . dexmedetomidine (PRECEDEX) IV infusion for high rates 0.7 mcg/kg/hr (08/24/11 0500)  . fentaNYL infusion INTRAVENOUS 100 mcg/hr (08/24/11 0500)  . midazolam (VERSED) infusion 1 mg/hr (08/24/11 0500)  . sodium  bicarbonate / D5W 1000 ML 100 mL/hr at 08/24/11 0500  . DISCONTD: sodium chloride 100 mL/hr at 08/24/11 0300  . DISCONTD: dexmedetomidine (PRECEDEX) IV infusion 0.7 mcg/kg/hr (08/24/11 0100)    No Known Allergies  Social history: The patient is married. His wife's name is Deontez Klinke and she can be reached at [phone]. The patient states that he quit smoking approximately one year ago but prior to this only smoked about 4 cigarettes per day. He drinks approximately 2 alcoholic beverages per day with his last intake yesterday. 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  Family history:  The patient's parents are both deceased. His mother died in her mid 32s from renal failure. She also had a history of diabetes. The patient's father died in his mid-60s from an MI. He is an only child with no siblings and he has one healthy son.  PHYSICAL EXAM: Filed Vitals:   08/24/11 0400  BP: 122/91  Pulse: 67  Temp: 98.4 F (36.9 C)  Resp: 16     Intake/Output Summary (Last 24 hours) at 08/24/11 0603 Last data filed at 08/24/11 0500  Gross per 24 hour  Intake  863.2 ml  Output    850 ml  Net   13.2 ml    General:  Intubated/sedated HEENT: normal except for diffuse eczematic rash Neck: supple. no JVD. Carotids 2+ bilat; no  bruits. No lymphadenopathy or thryomegaly appreciated. Cor: PMI nondisplaced. Regular rate & rhythm. No rubs, gallops or murmurs. Lungs: clear Abdomen: soft, nontender, nondistended. No hepatosplenomegaly. No bruits or masses. Good bowel sounds. Extremities: no cyanosis, clubbing, rash, edema Neuro: alert & oriented x 3, cranial nerves grossly intact. moves all 4 extremities w/o difficulty. Affect pleasant.  ECG: NSR 76 No ST-T wave abnormalities.  QTc 546 (down from 647) QRS 84ms   Results for orders placed during the hospital encounter of 08/23/11 (from the past 24 hour(s))  APTT      Status: Normal   Collection Time   08/23/11 10:05 PM      Component Value Range   aPTT 26  24 - 37 (seconds)  CARDIAC PANEL(CRET KIN+CKTOT+MB+TROPI)     Status: Abnormal   Collection Time   08/23/11 10:05 PM      Component Value Range   Total CK 323 (*) 7 - 232 (U/L)   CK, MB 3.9  0.3 - 4.0 (ng/mL)   Troponin I 0.59 (*) <0.30 (ng/mL)   Relative Index 1.2  0.0 - 2.5   CBC     Status: Abnormal   Collection Time   08/23/11 10:05 PM      Component Value Range   WBC 11.7 (*) 4.0 - 10.5 (K/uL)   RBC 4.78  4.22 - 5.81 (MIL/uL)   Hemoglobin 14.2  13.0 - 17.0 (g/dL)   HCT 16.1  09.6 - 04.5 (%)   MCV 90.2  78.0 - 100.0 (fL)   MCH 29.7  26.0 - 34.0 (pg)   MCHC 32.9  30.0 - 36.0 (g/dL)   RDW 40.9  81.1 - 91.4 (%)   Platelets 131 (*) 150 - 400 (K/uL)  PROTIME-INR     Status: Normal   Collection Time   08/23/11 10:05 PM      Component Value Range   Prothrombin Time 13.3  11.6 - 15.2 (seconds)   INR 0.99  0.00 - 1.49   MAGNESIUM     Status: Normal   Collection Time   08/23/11 10:05 PM      Component Value Range   Magnesium 1.9  1.5 - 2.5 (mg/dL)  PHOSPHORUS     Status: Normal   Collection Time   08/23/11 10:05 PM      Component Value Range   Phosphorus 3.4  2.3 - 4.6 (mg/dL)  BASIC METABOLIC PANEL     Status: Abnormal   Collection Time   08/23/11 10:40 PM      Component Value Range   Sodium 136  135 - 145 (mEq/L)   Potassium 3.4 (*) 3.5 - 5.1 (mEq/L)   Chloride 99  96 - 112 (mEq/L)   CO2 13 (*) 19 - 32 (mEq/L)   Glucose, Bld 216 (*) 70 - 99 (mg/dL)   BUN 8  6 - 23 (mg/dL)   Creatinine, Ser 7.82  0.50 - 1.35 (mg/dL)   Calcium 9.1  8.4 - 95.6 (mg/dL)   GFR calc non Af Amer >90  >90 (mL/min)   GFR calc Af Amer >90  >90 (mL/min)  GLUCOSE, CAPILLARY     Status: Abnormal   Collection Time   08/24/11 12:34 AM      Component Value Range   Glucose-Capillary 185 (*) 70 - 99 (mg/dL)  POCT I-STAT 3, BLOOD GAS (G3+)     Status: Abnormal   Collection Time   08/24/11  1:18 AM       Component Value Range   pH,  Arterial 7.304 (*) 7.350 - 7.450    pCO2 arterial 44.4  35.0 - 45.0 (mmHg)   pO2, Arterial 217.0 (*) 80.0 - 100.0 (mmHg)   Bicarbonate 22.1  20.0 - 24.0 (mEq/L)   TCO2 23  0 - 100 (mmol/L)   O2 Saturation 100.0     Acid-base deficit 4.0 (*) 0.0 - 2.0 (mmol/L)   Collection site RADIAL, ALLEN'S TEST ACCEPTABLE     Drawn by RT     Sample type ARTERIAL    BASIC METABOLIC PANEL     Status: Abnormal   Collection Time   08/24/11  4:07 AM      Component Value Range   Sodium 139  135 - 145 (mEq/L)   Potassium 4.6  3.5 - 5.1 (mEq/L)   Chloride 104  96 - 112 (mEq/L)   CO2 23  19 - 32 (mEq/L)   Glucose, Bld 180 (*) 70 - 99 (mg/dL)   BUN 11  6 - 23 (mg/dL)   Creatinine, Ser 1.61  0.50 - 1.35 (mg/dL)   Calcium 8.7  8.4 - 09.6 (mg/dL)   GFR calc non Af Amer 82 (*) >90 (mL/min)   GFR calc Af Amer >90  >90 (mL/min)  MAGNESIUM     Status: Normal   Collection Time   08/24/11  4:07 AM      Component Value Range   Magnesium 2.3  1.5 - 2.5 (mg/dL)  CBC     Status: Abnormal   Collection Time   08/24/11  4:07 AM      Component Value Range   WBC 10.1  4.0 - 10.5 (K/uL)   RBC 4.35  4.22 - 5.81 (MIL/uL)   Hemoglobin 12.9 (*) 13.0 - 17.0 (g/dL)   HCT 04.5 (*) 40.9 - 52.0 (%)   MCV 88.7  78.0 - 100.0 (fL)   MCH 29.7  26.0 - 34.0 (pg)   MCHC 33.4  30.0 - 36.0 (g/dL)   RDW 81.1  91.4 - 78.2 (%)   Platelets 121 (*) 150 - 400 (K/uL)  DIFFERENTIAL     Status: Abnormal   Collection Time   08/24/11  4:07 AM      Component Value Range   Neutrophils Relative 90 (*) 43 - 77 (%)   Neutro Abs 9.1 (*) 1.7 - 7.7 (K/uL)   Lymphocytes Relative 4 (*) 12 - 46 (%)   Lymphs Abs 0.4 (*) 0.7 - 4.0 (K/uL)   Monocytes Relative 7  3 - 12 (%)   Monocytes Absolute 0.7  0.1 - 1.0 (K/uL)   Eosinophils Relative 0  0 - 5 (%)   Eosinophils Absolute 0.0  0.0 - 0.7 (K/uL)   Basophils Relative 0  0 - 1 (%)   Basophils Absolute 0.0  0.0 - 0.1 (K/uL)  GLUCOSE, CAPILLARY     Status: Abnormal    Collection Time   08/24/11  4:40 AM      Component Value Range   Glucose-Capillary 181 (*) 70 - 99 (mg/dL)  CBC     Status: Abnormal (Preliminary result)   Collection Time   08/24/11  5:25 AM      Component Value Range   WBC 9.9  4.0 - 10.5 (K/uL)   RBC 4.28  4.22 - 5.81 (MIL/uL)   Hemoglobin 12.9 (*) 13.0 - 17.0 (g/dL)   HCT 95.6 (*) 21.3 - 52.0 (%)   MCV 88.6  78.0 - 100.0 (fL)   MCH 30.1  26.0 - 34.0 (pg)   MCHC 34.0  30.0 - 36.0 (g/dL)   RDW 16.1  09.6 - 04.5 (%)   Platelets PENDING  150 - 400 (K/uL)   Dg Chest Portable 1 View  08/23/2011  *RADIOLOGY REPORT*  Clinical Data: Respiratory difficulty  PORTABLE CHEST - 1 VIEW  Comparison: 09/15/2006  Findings: Low lung volumes.  Extensive bilateral diffuse airspace disease with a central distribution.  Endotracheal tube placed. Tip is 4.2 cm from carina.  Right subclavian central venous catheter tip at the cavoatrial junction.  No pneumothorax.  IMPRESSION: Endotracheal tube.  Right subclavian central venous catheter tip at the cavoatrial junction and no pneumothorax per  Bilateral diffuse airspace disease.  Original Report Authenticated By: Donavan Burnet, M.D.     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 Will check echo. Principal intervention will be to avoid ALL QT prolonging drugs. I have gone through his med list in detail and correlated to the [place] QT prolongation website. (will d/c Pepcid). Primary team has spoken to [employer]. As QTc now back to >671ms on telemetry will contact them again to see if there is any role for HD as benadryl and hydroxyzine both excreted through the urine. Keep K+ >4.0 and Mag > 2.0. Will follow.   Discussed with Poison Control and no role for urgent HD as substances  have high volume of distribution.  Time spent 1 hour.  Arvilla Meres, MD

## 2011-08-24 NOTE — Progress Notes (Signed)
eLink Physician-Brief Progress Note Patient Name: TORIANO AIKEY DOB: Nov 19, 1965 MRN: [id number]  Date of Service  08/24/2011   HPI/Events of Note  rn requesting sitter to bedside   eICU Interventions  Sitter ordered to bedside   Intervention Category Major Interventions: Code management / supervision Intermediate Interventions: Communication with other healthcare providers and/or family;Change in mental status - evaluation and management Minor Interventions: Communication with other healthcare providers and/or family  Raiven Belizaire 08/24/2011, 3:47 PM

## 2011-08-24 NOTE — Progress Notes (Signed)
SUBJECTIVE:  No new events.  OBJECTIVE:   Vitals:   Filed Vitals:   08/24/11 0400 08/24/11 0500 08/24/11 0600 08/24/11 0752  BP: 122/91 122/93 121/93   Pulse: 67 70 69   Temp: 98.4 F (36.9 C)   98.7 F (37.1 C)  TempSrc: Oral   Oral  Resp: 16 15 15    Height:      Weight:      SpO2: 100% 100% 100%    I&O's:   Intake/Output Summary (Last 24 hours) at 08/24/11 0818 Last data filed at 08/24/11 0600  Gross per 24 hour  Intake 1031.9 ml  Output    900 ml  Net  131.9 ml   TELEMETRY: Reviewed telemetry pt in NSR:     PHYSICAL EXAM General: Well developed, well nourished, in no acute distress Head: Eyes PERRLA, No xanthomas.   Normal cephalic and atramatic  Lungs:   Clear bilaterally to auscultation and percussion. Heart:   HRRR S1 S2 Pulses are 2+ & equal.           No JVD.  No abdominal bruits. No femoral bruits. Abdomen: Bowel sounds are positive, abdomen soft and non-tender without masses or                  Hernia's noted. Msk:  . Extremities: No clubbing, cyanosis or edema.  DP +1 Neuro: Sedated    LABS: Basic Metabolic Panel:  Basename 08/24/11 0407 08/23/11 2240 08/23/11 2205  NA 139 136 --  K 4.6 3.4* --  CL 104 99 --  CO2 23 13* --  GLUCOSE 180* 216* --  BUN 11 8 --  CREATININE 1.09 0.90 --  CALCIUM 8.7 9.1 --  MG 2.3 -- 1.9  PHOS -- -- 3.4   Liver Function Tests:  [facility] 08/23/11 0449  AST 122*  ALT 63*  ALKPHOS 138*  BILITOT 0.4  PROT 7.8  ALBUMIN 3.6   No results found for this basename: LIPASE:2,AMYLASE:2 in the last 72 hours CBC:  Basename 08/24/11 0525 08/24/11 0407 08/23/11 0449  WBC 9.9 10.1 --  NEUTROABS -- 9.1* 5.2  HGB 12.9* 12.9* --  HCT 37.9* 38.6* --  MCV 88.6 88.7 --  PLT 115* 121* --   Cardiac Enzymes:  Basename 08/23/11 2205 08/23/11 0449  CKTOTAL 323* 164  CKMB 3.9 --  CKMBINDEX -- --  TROPONINI 0.59* --   BNP: No results found for this basename: POCBNP:3 in the last 72 hours D-Dimer: No results found  for this basename: DDIMER:2 in the last 72 hours Hemoglobin A1C:  Basename 08/23/11 0449  HGBA1C 5.6   Fasting Lipid Panel: No results found for this basename: CHOL,HDL,LDLCALC,TRIG,CHOLHDL,LDLDIRECT in the last 72 hours Thyroid Function Tests: No results found for this basename: TSH,T4TOTAL,FREET3,T3FREE,THYROIDAB in the last 72 hours Anemia Panel: No results found for this basename: VITAMINB12,FOLATE,FERRITIN,TIBC,IRON,RETICCTPCT in the last 72 hours Coag Panel:   Lab Results  Component Value Date   INR 0.99 08/23/2011    RADIOLOGY: Dg Chest Portable 1 View  08/23/2011  *RADIOLOGY REPORT*  Clinical Data: Respiratory difficulty  PORTABLE CHEST - 1 VIEW  Comparison: 09/15/2006  Findings: Low lung volumes.  Extensive bilateral diffuse airspace disease with a central distribution.  Endotracheal tube placed. Tip is 4.2 cm from carina.  Right subclavian central venous catheter tip at the cavoatrial junction.  No pneumothorax.  IMPRESSION: Endotracheal tube.  Right subclavian central venous catheter tip at the cavoatrial junction and no pneumothorax per  Bilateral diffuse airspace disease.  Original  Report Authenticated By: Donavan Burnet, M.D.      ASSESSMENT: s/p cardiac arrest  PLAN:  Will check further cardiac enzymes.  Check echocardiogram to evaluate LV function.  Wil likely need cath to evaluate coronary arteries.  Biphasic T waves noted in anterior leads that could indicate ischemia.  Ross [name] S.  08/24/2011  8:18 AM

## 2011-08-24 NOTE — Progress Notes (Signed)
eLink Physician-Brief Progress Note Patient Name: Ross [name] DOB: 1966-07-06 MRN: [id number]  Date of Service  08/24/2011   HPI/Events of Note   Prolonged QTc in the setting of benadryl OD  eICU Interventions  Per poison control the recommendation is for initiation of bicarb gtt to counteract some of the effects of the benadryl OD   Intervention Category Major Interventions: Code management / supervision Minor Interventions: Communication with other healthcare providers and/or family  DETERDING,ELIZABETH 08/24/2011, 2:25 AM

## 2011-08-24 NOTE — Progress Notes (Signed)
Name: Ross [name] MRN: [id number] DOB: February 08, 1966  DOS:   08/23/11   CRITICAL CARE MEDICINE ADMISSION / CONSULTATION NOTE  Referring Physician [facility]  Reason For Consult  Cardiac Arrest 45 y/o male was admitted to [facility] on 11/6 after having a seizure at home.  Had been taking benadryl for a rash.  Admitted to floor, seized again night of 11/6 in front of family, then went into cardiac arrest.   Patient Description  45 y/o male was admitted to [facility] on 11/6 after having a seizure at home.  Had been taking benadryl for a rash.  Admitted to floor, seized again night of 11/6 in front of family, then went into cardiac arrest.      Location Start Stop        Culture Date Result       Antibiotic Indication Start Stop        GI Prophylaxis DVT Prophylaxis  Pepcid/dc'd 11/7 per cards Sub q hep   Protocols  Vent stabilization Precedex   Consultants  08/23/11 Cardiology     Date Imaging Studies  11/7 Cath- clean   Date Events  11/7-  Cath neg, awake, follows commands  11/7  tx to [facility] for cards evaluation     History Of Present Illness   45 y/o male was admitted to [facility] on 11/6 after having a seizure at home.  Had been taking benadryl for a rash.  Admitted to floor, seized again night of 11/6 in front of family, then went into cardiac arrest. He went into [place], was shocked, was transiently bradycardic, then went into V-fib and was defribrillated again.  He received epinephrine x1 and atropine. After arrival to the ICU, he was combative and tried to speak, making purposeful movements.  He was sedated.  QTc prolonged . Tx to [facility] 11/7 for cards evaluation.    Physical Examination  BP 121/93  Pulse 69  Temp(Src) 98.7 F (37.1 C) (Oral)  Resp 15  Ht 5\' 11"  (1.803 m)  Wt 235 lb 10.8 oz (106.9 kg)  BMI 32.87 kg/m2  SpO2 100% QT/qtc 532/555 @0728   Gen: Intubated HEENT: ett PULM: CTA CV: RRR, no mgr, no JVD AB: BS+, soft, nontender, no hsm Ext: warm, no  edema, no clubbing, no cyanosis Derm: rash noted throughout neck, desquamating. Decrease in severity rash., not warm, somem chronicity to folds Neuro: rass 0 , sedated precedex  Labs  Results for orders placed during the hospital encounter of 08/23/11 (from the past 24 hour(s))  APTT     Status: Normal   Collection Time   08/23/11 10:05 PM      Component Value Range   aPTT 26  24 - 37 (seconds)  CARDIAC PANEL(CRET KIN+CKTOT+MB+TROPI)     Status: Abnormal   Collection Time   08/23/11 10:05 PM      Component Value Range   Total CK 323 (*) 7 - 232 (U/L)   CK, MB 3.9  0.3 - 4.0 (ng/mL)   Troponin I 0.59 (*) <0.30 (ng/mL)   Relative Index 1.2  0.0 - 2.5   CBC     Status: Abnormal   Collection Time   08/23/11 10:05 PM      Component Value Range   WBC 11.7 (*) 4.0 - 10.5 (K/uL)   RBC 4.78  4.22 - 5.81 (MIL/uL)   Hemoglobin 14.2  13.0 - 17.0 (g/dL)   HCT 40.9  81.1 - 91.4 (%)   MCV 90.2  78.0 - 100.0 (fL)  MCH 29.7  26.0 - 34.0 (pg)   MCHC 32.9  30.0 - 36.0 (g/dL)   RDW 04.5  40.9 - 81.1 (%)   Platelets 131 (*) 150 - 400 (K/uL)  PROTIME-INR     Status: Normal   Collection Time   08/23/11 10:05 PM      Component Value Range   Prothrombin Time 13.3  11.6 - 15.2 (seconds)   INR 0.99  0.00 - 1.49   MAGNESIUM     Status: Normal   Collection Time   08/23/11 10:05 PM      Component Value Range   Magnesium 1.9  1.5 - 2.5 (mg/dL)  PHOSPHORUS     Status: Normal   Collection Time   08/23/11 10:05 PM      Component Value Range   Phosphorus 3.4  2.3 - 4.6 (mg/dL)  BASIC METABOLIC PANEL     Status: Abnormal   Collection Time   08/23/11 10:40 PM      Component Value Range   Sodium 136  135 - 145 (mEq/L)   Potassium 3.4 (*) 3.5 - 5.1 (mEq/L)   Chloride 99  96 - 112 (mEq/L)   CO2 13 (*) 19 - 32 (mEq/L)   Glucose, Bld 216 (*) 70 - 99 (mg/dL)   BUN 8  6 - 23 (mg/dL)   Creatinine, Ser 9.14  0.50 - 1.35 (mg/dL)   Calcium 9.1  8.4 - 78.2 (mg/dL)   GFR calc non Af Amer >90  >90 (mL/min)    GFR calc Af Amer >90  >90 (mL/min)  GLUCOSE, CAPILLARY     Status: Abnormal   Collection Time   08/24/11 12:34 AM      Component Value Range   Glucose-Capillary 185 (*) 70 - 99 (mg/dL)  POCT I-STAT 3, BLOOD GAS (G3+)     Status: Abnormal   Collection Time   08/24/11  1:18 AM      Component Value Range   pH, Arterial 7.304 (*) 7.350 - 7.450    pCO2 arterial 44.4  35.0 - 45.0 (mmHg)   pO2, Arterial 217.0 (*) 80.0 - 100.0 (mmHg)   Bicarbonate 22.1  20.0 - 24.0 (mEq/L)   TCO2 23  0 - 100 (mmol/L)   O2 Saturation 100.0     Acid-base deficit 4.0 (*) 0.0 - 2.0 (mmol/L)   Collection site RADIAL, ALLEN'S TEST ACCEPTABLE     Drawn by RT     Sample type ARTERIAL    BASIC METABOLIC PANEL     Status: Abnormal   Collection Time   08/24/11  4:07 AM      Component Value Range   Sodium 139  135 - 145 (mEq/L)   Potassium 4.6  3.5 - 5.1 (mEq/L)   Chloride 104  96 - 112 (mEq/L)   CO2 23  19 - 32 (mEq/L)   Glucose, Bld 180 (*) 70 - 99 (mg/dL)   BUN 11  6 - 23 (mg/dL)   Creatinine, Ser 9.56  0.50 - 1.35 (mg/dL)   Calcium 8.7  8.4 - 21.3 (mg/dL)   GFR calc non Af Amer 82 (*) >90 (mL/min)   GFR calc Af Amer >90  >90 (mL/min)  MAGNESIUM     Status: Normal   Collection Time   08/24/11  4:07 AM      Component Value Range   Magnesium 2.3  1.5 - 2.5 (mg/dL)  CBC     Status: Abnormal   Collection Time   08/24/11  4:07 AM  Component Value Range   WBC 10.1  4.0 - 10.5 (K/uL)   RBC 4.35  4.22 - 5.81 (MIL/uL)   Hemoglobin 12.9 (*) 13.0 - 17.0 (g/dL)   HCT 32.4 (*) 40.1 - 52.0 (%)   MCV 88.7  78.0 - 100.0 (fL)   MCH 29.7  26.0 - 34.0 (pg)   MCHC 33.4  30.0 - 36.0 (g/dL)   RDW 02.7  25.3 - 66.4 (%)   Platelets 121 (*) 150 - 400 (K/uL)  DIFFERENTIAL     Status: Abnormal   Collection Time   08/24/11  4:07 AM      Component Value Range   Neutrophils Relative 90 (*) 43 - 77 (%)   Neutro Abs 9.1 (*) 1.7 - 7.7 (K/uL)   Lymphocytes Relative 4 (*) 12 - 46 (%)   Lymphs Abs 0.4 (*) 0.7 - 4.0 (K/uL)    Monocytes Relative 7  3 - 12 (%)   Monocytes Absolute 0.7  0.1 - 1.0 (K/uL)   Eosinophils Relative 0  0 - 5 (%)   Eosinophils Absolute 0.0  0.0 - 0.7 (K/uL)   Basophils Relative 0  0 - 1 (%)   Basophils Absolute 0.0  0.0 - 0.1 (K/uL)  GLUCOSE, CAPILLARY     Status: Abnormal   Collection Time   08/24/11  4:40 AM      Component Value Range   Glucose-Capillary 181 (*) 70 - 99 (mg/dL)  CBC     Status: Abnormal   Collection Time   08/24/11  5:25 AM      Component Value Range   WBC 9.9  4.0 - 10.5 (K/uL)   RBC 4.28  4.22 - 5.81 (MIL/uL)   Hemoglobin 12.9 (*) 13.0 - 17.0 (g/dL)   HCT 40.3 (*) 47.4 - 52.0 (%)   MCV 88.6  78.0 - 100.0 (fL)   MCH 30.1  26.0 - 34.0 (pg)   MCHC 34.0  30.0 - 36.0 (g/dL)   RDW 25.9  56.3 - 87.5 (%)   Platelets 115 (*) 150 - 400 (K/uL)  BLOOD GAS, ARTERIAL     Status: Abnormal   Collection Time   08/24/11  6:00 AM      Component Value Range   FIO2 .50     Delivery systems PRESSURE REGULATED VOLUME CONTROL     VT 500     Rate 16     Peep/cpap 5.0     pH, Arterial 7.327 (*) 7.350 - 7.450    pCO2 arterial 43.0  35.0 - 45.0 (mmHg)   pO2, Arterial 120.0 (*) 80.0 - 100.0 (mmHg)   Bicarbonate 21.9  20.0 - 24.0 (mEq/L)   TCO2 23.2  0 - 100 (mmol/L)   Acid-base deficit 3.2 (*) 0.0 - 2.0 (mmol/L)   O2 Saturation 98.3     Patient temperature 98.6     Allens test (pass/fail) PASS  PASS     Imaging CXR improved pulm edema  Assessment Patient Active Hospital Problem List:  Cardiac Arrest: Assessment: Likely due to either hypoxemia in setting of seizure or due to QTc prolongation.  DDx includes primary cardiac event.  Plan: -cards consult 11/7 per [facility] cards, cath done neg -2D TTE Per cards Will he need EP study? Or just dc all long and short acting antiHistamines?  QTc Prolongation Assessment: Likely due to benadryl, clariton, see above  Plan: -mg  -ca  -bicarb gtt, consider dc...will contact poison control, .ibuprofen do not see a role for  this  Respiratory  failure: Assessment: insetting of cardiac arrest Plan: -full vent support -abg now reviewed, improved -CXR in am Last pcxr c/w edema Will need neg balance, holding lasix with recent contrast -SBT/WUA daily, attempt SBT this afternoon cpap 5 ps 5  Seizure (08/23/2011)  resolving, likely from hypoperfusion, arrythmia   Plan:  -prn benzodiazepines -frequent neuro checks -fentanyl gtt and precedex, would like to dc prexedex if able, prefer propofol if tolerates BP -WUA daily -may need neuro consult, CT head  Currently with WUA followed commands, NO role LP eeg sent, await read  Poisoning by anticholinergic drug (08/23/2011)   Assessment: Poison control notified.   Plan: -no anticholinergics -started bicarb infusion, conisder dc -?lipids but hd stable, hold off as hemodynacmis good Dc precedex  Papular rash, generalized (08/23/2011)   Assessment: Uncertain etiology. Followed by [facility] and Dr.Frank  [place] [facility] [place] [phone]    Plan:  -Derm consult after extubation, will consider skin bx solumedral for now  Best Practice Feeding: NPO Analgesia: fentanyl Sedation: precedex Thromboprophylaxis:sub q hep HOB >30 degrees Ulcer prophylaxis: pepcid dc'd due to increase in QT per cards Glucose control: monitor CBG   Candie Chroman, MD 08/24/2011, 8:12 AM   Critical Care Time:

## 2011-08-24 NOTE — Procedures (Signed)
PROCEDURE:  Left heart catheterization with selective coronary angiography, left ventriculogram.  INDICATIONS:  Cardiac arrest; Abnormal ECG  The risks, benefits, and details of the procedure were explained to the patient's wife.  She verbalized understanding and wanted to proceed.  Informed written consent was obtained.  PROCEDURE TECHNIQUE:  Patient arrived to the cath lab already intubated.  After Xylocaine anesthesia a 59F sheath was placed in the right femoral artery with a single anterior needle wall stick.   Left coronary angiography was done using a Judkins L4 guide catheter.  Right coronary angiography was done using a Judkins R4 guide catheter.  Left heart cath was done using a pigtail catheter.  Left ventriculography was not performed due to elevated LVEDP.   CONTRAST:  Total of 40 cc.  COMPLICATIONS:  None.    HEMODYNAMICS:  Aortic pressure was 146/107; LV pressure was 144/27; LVEDP 40.  There was no gradient between the left ventricle and aorta.    ANGIOGRAPHIC DATA:   The left main coronary artery is normal.  The left anterior descending artery is a large vessel and widely patent.  The D1 is medium sized and patent.  The left circumflex artery is large and widely patent.  OM1, OM2 are large and widely patent.  The right coronary artery is a large dominant vessel.  PDA is medium sized and patent.  PLA is small.  LEFT VENTRICULOGRAM:  Left ventricular angiogram was not done.  LVEDP was 40 mmHg.  IMPRESSIONS:  1. Normal left main coronary artery. 2. Normal left anterior descending artery and its branches. 3. Normal left circumflex artery and its branches. 4. Normal right coronary artery. 5. Did not evaluate left ventricular systolic function.  LVEDP 40 mmHg.    RECOMMENDATION:  No CAD.  Cardiac arrest does not appear to be a primary ischemic event.  Increased LVEDP consistent with volume overload.  May ultimately need diuresis after acidosis corrected.  Ross [name]  S. 08/24/2011

## 2011-08-24 NOTE — Procedures (Signed)
EEG NUMBER:  [id number].  This routine EEG was requested on this 45 year old man, who was admitted on August 23, 2011, on a ventilator after having an in-hospital V Fib arrest.  There is a question of a previous tonic-clonic seizure.  His medications include aspirin, fentanyl, midazolam and Precedex.  The EEG was done with the patient unresponsive.  Background activities were composed of a mixture of low amplitude delta and theta activities with some faster alpha activities that appeared to be of greatest prominence in the frontal central regions.  A note was made of a relative paucity of faster frequencies over the right temporal chain with a predominance of continuous arrhythmic delta activities in that area.  Photic stimulation did not produce a driving response.  The patient could not be hyperventilated due to his unresponsive state.  The EEG was unreactive to painful stimulation.  There were no normal activities of sleep seen.  CLINICAL INTERPRETATION:  This routine EEG done with the patient unresponsive is abnormal.  Unreactive background activities, mainly in the delta and theta ranges, suggest a moderate-to-severe encephalopathy of nonspecific etiology.  In addition, the relative paucity of faster frequencies and the predominance of continuous arrhythmic delta activities in the right temporal chain suggest an underlying structural abnormality in that location.  No epileptiform discharges, electrographic seizures, or nonconvulsive status epilepticus was seen.          ______________________________ Denton Meek, MD    YQ:MVHQ D:  08/24/2011 15:27:34  T:  08/24/2011 16:00:31  Job #:  [id number]

## 2011-08-25 ENCOUNTER — Inpatient Hospital Stay (HOSPITAL_COMMUNITY): Payer: Non-veteran care

## 2011-08-25 DIAGNOSIS — R0902 Hypoxemia: Secondary | ICD-10-CM

## 2011-08-25 DIAGNOSIS — I469 Cardiac arrest, cause unspecified: Secondary | ICD-10-CM

## 2011-08-25 DIAGNOSIS — T443X1A Poisoning by other parasympatholytics [anticholinergics and antimuscarinics] and spasmolytics, accidental (unintentional), initial encounter: Secondary | ICD-10-CM

## 2011-08-25 DIAGNOSIS — J9600 Acute respiratory failure, unspecified whether with hypoxia or hypercapnia: Secondary | ICD-10-CM

## 2011-08-25 LAB — GLUCOSE, CAPILLARY
Glucose-Capillary: 130 mg/dL — ABNORMAL HIGH (ref 70–99)
Glucose-Capillary: 157 mg/dL — ABNORMAL HIGH (ref 70–99)

## 2011-08-25 LAB — CBC
MCV: 90.6 fL (ref 78.0–100.0)
Platelets: 102 10*3/uL — ABNORMAL LOW (ref 150–400)
RBC: 4.47 MIL/uL (ref 4.22–5.81)
RDW: 15.3 % (ref 11.5–15.5)
WBC: 8.5 10*3/uL (ref 4.0–10.5)

## 2011-08-25 LAB — DIFFERENTIAL
Basophils Absolute: 0 10*3/uL (ref 0.0–0.1)
Basophils Relative: 0 % (ref 0–1)
Eosinophils Absolute: 0 10*3/uL (ref 0.0–0.7)
Monocytes Absolute: 0.7 10*3/uL (ref 0.1–1.0)
Monocytes Relative: 8 % (ref 3–12)
Neutro Abs: 7.6 10*3/uL (ref 1.7–7.7)
Neutrophils Relative %: 89 % — ABNORMAL HIGH (ref 43–77)

## 2011-08-25 LAB — POCT I-STAT 3, ART BLOOD GAS (G3+)
Bicarbonate: 27.3 mEq/L — ABNORMAL HIGH (ref 20.0–24.0)
TCO2: 29 mmol/L (ref 0–100)
pCO2 arterial: 48.4 mmHg — ABNORMAL HIGH (ref 35.0–45.0)
pH, Arterial: 7.359 (ref 7.350–7.450)
pO2, Arterial: 75 mmHg — ABNORMAL LOW (ref 80.0–100.0)

## 2011-08-25 LAB — MAGNESIUM: Magnesium: 1.8 mg/dL (ref 1.5–2.5)

## 2011-08-25 LAB — COMPREHENSIVE METABOLIC PANEL
AST: 66 U/L — ABNORMAL HIGH (ref 0–37)
Albumin: 2.7 g/dL — ABNORMAL LOW (ref 3.5–5.2)
BUN: 15 mg/dL (ref 6–23)
Chloride: 103 mEq/L (ref 96–112)
Creatinine, Ser: 1.04 mg/dL (ref 0.50–1.35)
Potassium: 4.3 mEq/L (ref 3.5–5.1)
Total Protein: 6.2 g/dL (ref 6.0–8.3)

## 2011-08-25 LAB — MRSA PCR SCREENING: MRSA by PCR: NEGATIVE

## 2011-08-25 MED ORDER — PROPOFOL 10 MG/ML IV EMUL
5.0000 ug/kg/min | INTRAVENOUS | Status: DC
  Administered 2011-08-25 (×2): 47.529 ug/kg/min via INTRAVENOUS
  Administered 2011-08-25: 60.044 ug/kg/min via INTRAVENOUS
  Administered 2011-08-25: 50 ug/kg/min via INTRAVENOUS
  Administered 2011-08-25: 60 ug/kg/min via INTRAVENOUS
  Administered 2011-08-25: 60.044 ug/kg/min via INTRAVENOUS
  Administered 2011-08-26 (×3): 45 ug/kg/min via INTRAVENOUS
  Administered 2011-08-26: 60 ug/kg/min via INTRAVENOUS
  Administered 2011-08-26 (×2): 50 ug/kg/min via INTRAVENOUS
  Administered 2011-08-27 (×2): 45 ug/kg/min via INTRAVENOUS
  Filled 2011-08-25 (×16): qty 100

## 2011-08-25 MED ORDER — LORAZEPAM 2 MG/ML IJ SOLN
2.0000 mg | Freq: Four times a day (QID) | INTRAMUSCULAR | Status: DC
  Administered 2011-08-25 – 2011-08-28 (×11): 2 mg via INTRAVENOUS
  Filled 2011-08-25 (×11): qty 1

## 2011-08-25 MED ORDER — PROPOFOL 10 MG/ML IV EMUL
5.0000 ug/kg/min | INTRAVENOUS | Status: DC
  Administered 2011-08-25 (×2): 60 ug/kg/min via INTRAVENOUS
  Filled 2011-08-25 (×2): qty 100

## 2011-08-25 MED ORDER — OSMOLITE 1.2 CAL PO LIQD
1000.0000 mL | ORAL | Status: DC
  Filled 2011-08-25 (×2): qty 1000

## 2011-08-25 MED ORDER — PRO-STAT SUGAR FREE PO LIQD
30.0000 mL | Freq: Every day | ORAL | Status: DC
  Administered 2011-08-25 – 2011-08-27 (×9): 30 mL
  Filled 2011-08-25 (×13): qty 30

## 2011-08-25 MED ORDER — FAMOTIDINE IN NACL 20-0.9 MG/50ML-% IV SOLN
20.0000 mg | Freq: Two times a day (BID) | INTRAVENOUS | Status: DC
  Administered 2011-08-25 – 2011-08-29 (×9): 20 mg via INTRAVENOUS
  Filled 2011-08-25 (×12): qty 50

## 2011-08-25 MED ORDER — FUROSEMIDE 10 MG/ML IJ SOLN
40.0000 mg | Freq: Two times a day (BID) | INTRAMUSCULAR | Status: AC
  Administered 2011-08-25 – 2011-08-27 (×4): 40 mg via INTRAVENOUS
  Filled 2011-08-25 (×5): qty 4

## 2011-08-25 MED ORDER — PROMOTE PO LIQD
1000.0000 mL | ORAL | Status: DC
  Administered 2011-08-25 – 2011-08-26 (×2): 1000 mL
  Filled 2011-08-25 (×3): qty 1000

## 2011-08-25 MED ORDER — METHYLPREDNISOLONE SODIUM SUCC 125 MG IJ SOLR
60.0000 mg | Freq: Two times a day (BID) | INTRAMUSCULAR | Status: DC
  Administered 2011-08-25: 125 mg via INTRAVENOUS
  Administered 2011-08-26: 60 mg via INTRAVENOUS
  Filled 2011-08-25 (×2): qty 0.96

## 2011-08-25 NOTE — Progress Notes (Signed)
Nutrition Follow-up/consult/brief note  Received consult for TF.  RD ordered TF as outlined in Initial Nutrition Assessment:  Promote at 35 ml/h with Prostat 30 ml 5 times daily to provide 1200 kcals, 128 grams protein, 794 ml free water daily.   RD to monitor TF tolerance, labs, weight trend.

## 2011-08-25 NOTE — Plan of Care (Signed)
Problem: Phase I Progression Outcomes Goal: Vascular site scale level 0 - I Vascular Site Scale Level 0: No bruising/bleeding/hematoma Level I (Mild): Bruising/Ecchymosis, minimal bleeding/ooozing, palpable hematoma < 3 cm Level II (Moderate): Bleeding not affecting hemodynamic parameters, pseudoaneurysm, palpable hematoma > 3 cm  Rt groin and Level 0

## 2011-08-25 NOTE — Progress Notes (Signed)
INITIAL ADULT NUTRITION ASSESSMENT Date: 08/25/2011   Time: 11:34 AM Reason for Assessment: VDRF  ASSESSMENT: Male 45 y.o.  Dx: Seizure, Cardiac Arrest  Hx:  Past Medical History  Diagnosis Date  . Sleep apnea     had surgery to correct it  . Mental disorder     PTSD  . Seizures     currently dx with seizures  . Cardiac arrest 08/23/2011    Related Meds:     . antiseptic oral rinse  15 mL Mouth Rinse Q4H  . aspirin      . aspirin  324 mg Per NG tube Once  . calcium gluconate  2 g Intravenous Once  . chlorhexidine  15 mL Mouth/Throat BID  . cloNIDine  0.1 mg Oral BID  . furosemide  40 mg Intravenous Once  . heparin      . heparin subcutaneous  5,000 Units Subcutaneous Q8H  . magnesium sulfate IVPB  3 g Intravenous Once  . methylPREDNISolone (SOLU-MEDROL) injection  80 mg Intravenous Q8H  . nitroGLYCERIN      . potassium chloride  40 mEq Per Tube BID  . sodium chloride  1,000 mL Intravenous Once     Ht: 5\' 11"  (180.3 cm)  Wt: 231 lb 14.8 oz (105.2 kg)  Ideal Wt: 78.2 kg % Ideal Wt: 135%  Usual Wt: 230-235 pounds % Usual Wt: 100%  Body mass index is 32.35 kg/(m^2).  Food/Nutrition Related Hx: No nutrition problems PTA, good appetite, weight stable per wife  Labs: Sodium 139 mEq/L      Potassium 4.3 mEq/L      Chloride 103 mEq/L      CO2 28 mEq/L      Glucose, Bld 149 mg/dL H     BUN 15 mg/dL      Creatinine, Ser 1.61 mg/dL      Calcium 9.0 mg/dL      Total Protein 6.2 g/dL      Albumin 2.7 g/dL L     AST 66 U/L H     ALT 50 U/L      Alkaline Phosphatase 89 U/L      Total Bilirubin 0.6 mg/dL Sodium 096 mEq/L      Potassium 4.3 mEq/L      Chloride 103 mEq/L      CO2 28 mEq/L      Glucose, Bld 149 mg/dL H     BUN 15 mg/dL      Creatinine, Ser 0.45 mg/dL      Calcium 9.0 mg/dL      Total Protein 6.2 g/dL      Albumin 2.7 g/dL L     AST 66 U/L H     ALT 50 U/L      Alkaline Phosphatase 89 U/L      Total Bilirubin 0.6 mg/dL  CBG's:  [phone]  I/O last 3  completed shifts: In: 2269.6 [P.O.:78; I.V.:2081.6; NG/GT:60; IV Piggyback:50] Out: 4700 [Urine:4700]   Diet Order:  NPO  Supplements/Tube Feeding:  none  IVF: NS at 20 ml/h        Propofol at 37.9 ml/h providing 1000 kcals daily  Estimated Nutritional Needs:   Kcal: 2100 Protein: 115-135 grams Fluid: 2.2-2.5 liters  NUTRITION DIAGNOSIS: -Inadequate oral intake (NI-2.1).  Status: Ongoing  RELATED TO: inability to eat   AS EVIDENCE BY: NPO status  MONITORING/EVALUATION(Goals): Goal:  Initiate TF to meet nutrition needs if unable to extubate by tomorrow AM. Monitor:  TF initiation, tolerance; weight trend; labs;  medications  EDUCATION NEEDS: -No education needs identified at this time  INTERVENTION: For TF, recommend Promote at 35 ml/h with Prostat 30 ml 5 times dialy to provide 1200 kcals, 128 grams protein, 794 ml free water daily.  This plus Propofol will meet nutrition needs.    When Propofol discontinued, recommend Promote at 40 ml/h with Prostat 30 ml QID to provide 1248 kcals, 120 grams protein, 806 ml free water daily.  Dietitian [phone]  DOCUMENTATION CODES Per approved criteria  -Obesity Unspecified    Ross [name] 08/25/2011, 11:34 AM

## 2011-08-25 NOTE — Progress Notes (Signed)
eLink Physician-Brief Progress Note Patient Name: Ross [name] DOB: 05/09/1966 MRN: [id number]  Date of Service  08/25/2011   HPI/Events of Note  Severe Agitation   eICU Interventions  Camera evaluation shows greater than 10 nurses/staff at bedside restraining the patient.  Apparently while attempting mouth care the patient became agitated/agressive.  Multiple staff required to restrain patient.  Patient given bolus of fentanyl and propofol.  Patient is sedated now. Plan: Change in propofol to 80 mcg max dose Continue with current sedation     Darby Shadwick 08/25/2011, 12:27 AM

## 2011-08-25 NOTE — Plan of Care (Signed)
Problem: Phase I Progression Outcomes Goal: Hemodynamically stable Patient is hemodynamically stable not on pressors

## 2011-08-25 NOTE — Progress Notes (Signed)
SUBJECTIVE:  No arrhythmia.  Wakes up without sedation.  OBJECTIVE:   Vitals:   Filed Vitals:   08/25/11 0500 08/25/11 0732 08/25/11 0800 08/25/11 0819  BP: 113/72 115/75 118/72   Pulse: 82 77 76   Temp:    99.1 F (37.3 C)  TempSrc:    Oral  Resp: 13 16 12    Height:      Weight: 105.2 kg (231 lb 14.8 oz)     SpO2: 97% 100% 100%    I&O's:   Intake/Output Summary (Last 24 hours) at 08/25/11 0857 Last data filed at 08/25/11 0400  Gross per 24 hour  Intake   1064 ml  Output   3650 ml  Net  -2586 ml   TELEMETRY: Reviewed telemetry pt in NSR.  QT interval still prolonged.  Ant ST segment changes.      PHYSICAL EXAM General: Well developed, well nourished, intubated Head: Eyes PERRLA, No xanthomas.   Normal cephalic and atramatic  Lungs:   Coarse BS Heart:   HRRR S1 S2 Pulses are 2+ & equal.             No abdominal bruits. No femoral bruits. Abdomen:  abdomen soft Msk:  Back normal, normal gait.ear bilaterally to auscultation and percussion. Normal strength and tone for age. Extremities:   No clubbing, cyanosis or edema.  DP +1 Neuro: sedated    LABS: Basic Metabolic Panel:  Basename 08/25/11 0250 08/24/11 1400 08/24/11 0407 08/23/11 2205  NA 139 -- 139 --  K 4.3 -- 4.6 --  CL 103 -- 104 --  CO2 28 -- 23 --  GLUCOSE 149* -- 180* --  BUN 15 -- 11 --  CREATININE 1.04 0.91 -- --  CALCIUM 9.0 -- 8.7 --  MG 1.8 -- 2.3 --  PHOS -- -- -- 3.4   Liver Function Tests:  [facility] 08/25/11 0250 08/23/11 0449  AST 66* 122*  ALT 50 63*  ALKPHOS 89 138*  BILITOT 0.6 0.4  PROT 6.2 7.8  ALBUMIN 2.7* 3.6   No results found for this basename: LIPASE:2,AMYLASE:2 in the last 72 hours CBC:  Basename 08/25/11 0250 08/24/11 1400 08/24/11 0407  WBC 8.5 9.3 --  NEUTROABS 7.6 -- 9.1*  HGB 13.1 12.9* --  HCT 40.5 39.3 --  MCV 90.6 89.7 --  PLT 102* 107* --   Cardiac Enzymes:  Basename 08/24/11 0901 08/23/11 2205 08/23/11 0449  CKTOTAL 111 323* 164  CKMB 4.5* 3.9 --    CKMBINDEX -- -- --  TROPONINI 0.91* 0.59* --   BNP: No results found for this basename: POCBNP:3 in the last 72 hours D-Dimer: No results found for this basename: DDIMER:2 in the last 72 hours Hemoglobin A1C:  Basename 08/23/11 0449  HGBA1C 5.6   Fasting Lipid Panel: No results found for this basename: CHOL,HDL,LDLCALC,TRIG,CHOLHDL,LDLDIRECT in the last 72 hours Thyroid Function Tests: No results found for this basename: TSH,T4TOTAL,FREET3,T3FREE,THYROIDAB in the last 72 hours Anemia Panel: No results found for this basename: VITAMINB12,FOLATE,FERRITIN,TIBC,IRON,RETICCTPCT in the last 72 hours Coag Panel:   Lab Results  Component Value Date   INR 0.99 08/24/2011   INR 0.99 08/23/2011    RADIOLOGY: Ct Head Wo Contrast  08/24/2011  *RADIOLOGY REPORT*  Clinical Data: Seizure.  Cardiac arrest.  Evaluate for infarct.  CT HEAD WITHOUT CONTRAST  Technique:  Contiguous axial images were obtained from the base of the skull through the vertex without contrast.  Comparison: None.  Findings: There is no evidence for acute hemorrhage, hydrocephalus, mass  lesion, or abnormal extra-axial fluid collection.  No definite CT evidence for acute infarction.  The visualized paranasal sinuses and mastoid air cells are clear.  IMPRESSION: Normal exam.  Original Report Authenticated By: ERIC A. MANSELL, M.D.   Dg Chest Portable 1 View  08/25/2011  *RADIOLOGY REPORT*  Clinical Data: Check endotracheal tube, shortness of breath  PORTABLE CHEST - 1 VIEW  Comparison: 08/24/2011  Findings: Endotracheal tube at the thoracic inlet, terminating 7 cm above the carina.  Enteric tube looped in the stomach.  Right subclavian venous catheter terminating at the cavoatrial junction.  Patchy bilateral airspace opacities, possibly reflecting edema and/or infection.  No pleural effusion or pneumothorax.  The heart is normal in size.  IMPRESSION: Endotracheal tube  terminates 10 cm above the carina.  Stable support apparatus, as  above.  Patchy bilateral airspace opacities, possibly reflecting edema and/or infection.  Original Report Authenticated By: Charline Bills, M.D.   Dg Chest Portable 1 View  08/24/2011  *RADIOLOGY REPORT*  Clinical Data: Respiratory distress  PORTABLE CHEST - 1 VIEW  Comparison: 08/24/2011 8:50 hours  Findings: Under aerated lungs.  Diffuse bilateral airspace disease is worse.  Tubular devices are unchanged.  No pneumothorax.  Normal heart size.  IMPRESSION: Worsening diffuse bilateral airspace disease.  Original Report Authenticated By: Donavan Burnet, M.D.   Dg Chest Portable 1 View  08/24/2011  *RADIOLOGY REPORT*  Clinical Data: Evaluate endotracheal tube placement  PORTABLE CHEST - 1 VIEW  Comparison: 08/23/2011; 09/15/2006  Findings:  Grossly unchanged cardiac silhouette and mediastinal contours. Endotracheal tube overlies tracheal air column with tip superior to the carina.  Enteric tube terminates into the left hemidiaphragm. Unchanged positioning of a right subclavian approach central venous catheter with tip overlying the mid SVC.  Improved aeration of the lungs with persistent perihilar heterogeneous opacities.  Mild cephalization of flow without frank evidence of pulmonary edema. No definite pneumothorax or pleural effusion.  Grossly unchanged bones.  IMPRESSION: 1.  Appropriately positioned support apparatus as above.  No definite pneumothorax. 2.  Improved aeration of the lungs with persistent perihilar opacities, possibly atelectasis though infection is not excluded.  Original Report Authenticated By: Waynard Reeds, M.D.   Dg Chest Portable 1 View  08/23/2011  *RADIOLOGY REPORT*  Clinical Data: Respiratory difficulty  PORTABLE CHEST - 1 VIEW  Comparison: 09/15/2006  Findings: Low lung volumes.  Extensive bilateral diffuse airspace disease with a central distribution.  Endotracheal tube placed. Tip is 4.2 cm from carina.  Right subclavian central venous catheter tip at the cavoatrial  junction.  No pneumothorax.  IMPRESSION: Endotracheal tube.  Right subclavian central venous catheter tip at the cavoatrial junction and no pneumothorax per  Bilateral diffuse airspace disease.  Original Report Authenticated By: Donavan Burnet, M.D.      ASSESSMENT: Cardiac arrest in the setting of excessive hydroxyzine and benadryl.  PLAN:  Continue supportive care.  No CAD despite ECG changes.  ECHO pending.  Increased LVEDP.  May need additional diuresis.  Evidence of edema by chest xray.  Negative fluid balance yesterday.  VARANASI,JAYADEEP S.  08/25/2011  8:57 AM

## 2011-08-25 NOTE — Plan of Care (Signed)
Problem: Phase I Progression Outcomes Goal: Vascular site scale level 0 - I Vascular Site Scale Level 0: No bruising/bleeding/hematoma Level I (Mild): Bruising/Ecchymosis, minimal bleeding/ooozing, palpable hematoma < 3 cm Level II (Moderate): Bleeding not affecting hemodynamic parameters, pseudoaneurysm, palpable hematoma > 3 cm  In Goal: Post Cath/PCI return to appropriate Path  a

## 2011-08-25 NOTE — Plan of Care (Signed)
Problem: Phase I Progression Outcomes Goal: Arrhythmia controlled or corrected Patient in NSR

## 2011-08-25 NOTE — Significant Event (Signed)
Patient admitted to floor and put on telemetry. Patient showing runs of V tach on monitor then converting back and forth between bigeminy, SR, and runs of V tach. Night coverage text paged about cardiac rhythms and patient being tremulous. At 2142, patient went into V tach and converted to A fib. At that time, the assistant director noticed the rhythm on the monitor and called and initiated the code.

## 2011-08-25 NOTE — Progress Notes (Signed)
Follow up - Critical Care Medicine Note  Patient Details:    Ross [name] is an 45 y.o. male admitted to [facility] on 11/6 after having a seizure at home. Had been taking Benadryl for a rash. Admitted to floor, seized again night of 08/23/11 in front of family, then went into cardiac arrest. He went into [place], was shocked, was transiently bradycardic, then went into V-fib and was defribrillated again. He received epinephrine x1 and atropine.   After arrival to the ICU, he was combative and tried to speak, making purposeful movements. He was sedated. QTc prolonged . Tx to [facility] 11/7 for cards evaluation.  Lines, Airways, Drains: Intubation and on ventilation 08/23/11 >>> Right IJ triple lumen catheter 08/23/11 >>> NG/Og tube 08/23/11 >>>  Anti-infectives:  No antibiotic on board  Microbiology: No results found for this or any previous visit.  Best Practice/Protocols:  GI:  NPO, Pepcid Analgesic:  Fentanyl Sedation:  Precedex DVT:  SQ heparin  Events:   Studies:  PORTABLE CHEST 1 VIEW 08/25/11:   IMPRESSION: Endotracheal tube terminates 10 cm above the carina. Stable  support apparatus, as above. Patchy bilateral airspace opacities, possibly reflecting edema and/or infection.  Ct Head Wo Contrast 08/24/2011:  There is no evidence for acute hemorrhage, hydrocephalus, mass lesion, or abnormal extra-axial fluid collection.  No definite CT evidence for acute infarction.  The visualized paranasal sinuses and mastoid air cells are clear.  IMPRESSION: Normal exam.   Dg Chest Portable 1 View:  08/25/2011  *RADIOLOGY REPORT*  Clinical Data: Check endotracheal tube, shortness of breath  PORTABLE CHEST - 1 VIEW  Comparison: 08/24/2011  Findings: Endotracheal tube at the thoracic inlet, terminating 7 cm above the carina.  Enteric tube looped in the stomach.  Right subclavian venous catheter terminating at the cavoatrial junction.  Patchy bilateral airspace opacities, possibly reflecting edema  and/or infection.  No pleural effusion or pneumothorax.  The heart is normal in size.  IMPRESSION: Endotracheal tube  terminates 10 cm above the carina.  Stable support apparatus, as above.  Patchy bilateral airspace opacities, possibly reflecting edema and/or infection.  Original Report Authenticated By: Charline Bills, M.D.   Dg Chest Portable 1 View  08/24/2011  *RADIOLOGY REPORT*  Clinical Data: Respiratory distress  PORTABLE CHEST - 1 VIEW  Comparison: 08/24/2011 8:50 hours  Findings: Under aerated lungs.  Diffuse bilateral airspace disease is worse.  Tubular devices are unchanged.  No pneumothorax.  Normal heart size.  IMPRESSION: Worsening diffuse bilateral airspace disease.  Original Report Authenticated By: Donavan Burnet, M.D.   Dg Chest Portable 1 View  08/24/2011  *RADIOLOGY REPORT*  Clinical Data: Evaluate endotracheal tube placement  PORTABLE CHEST - 1 VIEW  Comparison: 08/23/2011; 09/15/2006  Findings:  Grossly unchanged cardiac silhouette and mediastinal contours. Endotracheal tube overlies tracheal air column with tip superior to the carina.  Enteric tube terminates into the left hemidiaphragm. Unchanged positioning of a right subclavian approach central venous catheter with tip overlying the mid SVC.  Improved aeration of the lungs with persistent perihilar heterogeneous opacities.  Mild cephalization of flow without frank evidence of pulmonary edema. No definite pneumothorax or pleural effusion.  Grossly unchanged bones.  IMPRESSION: 1.  Appropriately positioned support apparatus as above.  No definite pneumothorax. 2.  Improved aeration of the lungs with persistent perihilar opacities, possibly atelectasis though infection is not excluded.  Original Report Authenticated By: Waynard Reeds, M.D.   Dg Chest Portable 1 View  08/23/2011  *RADIOLOGY REPORT*  Clinical  Data: Respiratory difficulty  PORTABLE CHEST - 1 VIEW  Comparison: 09/15/2006  Findings: Low lung volumes.  Extensive  bilateral diffuse airspace disease with a central distribution.  Endotracheal tube placed. Tip is 4.2 cm from carina.  Right subclavian central venous catheter tip at the cavoatrial junction.  No pneumothorax.  IMPRESSION: Endotracheal tube.  Right subclavian central venous catheter tip at the cavoatrial junction and no pneumothorax per  Bilateral diffuse airspace disease.  Original Report Authenticated By: Donavan Burnet, M.D.    Consults: Treatment Team:  Corky Crafts   Subjective:    Overnight Issues:  Hypoxia mild, treated lasix  Objective:  Vital signs for last 24 hours: Temp:  [98.3 F (36.8 C)-99.8 F (37.7 C)] 99 F (37.2 C) (11/08 1109) Pulse Rate:  [66-94] 91  (11/08 1000) Resp:  [8-24] 12  (11/08 1117) BP: (112-153)/(72-108) 117/72 mmHg (11/08 1100) SpO2:  [90 %-100 %] 100 % (11/08 1100) FiO2 (%):  [40 %-100 %] 100 % (11/08 1117) Weight:  [105.2 kg (231 lb 14.8 oz)] 231 lb 14.8 oz (105.2 kg) (11/08 0500)  Hemodynamic parameters for last 24 hours: CVP:  [7 mmHg-285 mmHg] 12 mmHg  Intake/Output from previous day: 11/07 0701 - 11/08 0700 In: 1237.7 [P.O.:78; I.V.:1099.7; NG/GT:60] Out: 3800 [Urine:3800]   Intake/Output this shift:    Vent settings for last 24 hours: Vent Mode:  [-] PRVC FiO2 (%):  [40 %-100 %] 100 % Set Rate:  [16 bmp] 16 bmp Vt Set:  [500 mL] 500 mL PEEP:  [5 cmH20] 5 cmH20 Plateau Pressure:  [12 cmH20-14 cmH20] 12 cmH20  Physical Exam:  General:  Arousable and responsive to name calling but mildly agitate when arouse HEENT:  NCAT, no JVD, no LAD, some saliva discharge while examinating LUNGS:  Coarse, no wheeze HEART:  RRR, no murmurs/rubs/gallps ABD:  (+)BS, soft, nontender, nondistended, no guarding EXTR:  Intact, no cyanosis, no rashes, no edema SKIN:  Rash on necklace line is improving and most of the right side has now dry and slough off  Assessment/Plan:   NEURO  Seizure resolved, likley related to arrythmia Requires  high sedation needs   Plan:  Tox screen negative Benzodiazepines as needed and frequent neuro check Fetanyl, Precedex on board WUA daily Ct head neg No role for LP, follow commands 11/7 Add ativan around clock in hopes to reduce propofol needs   PULM  Acute Respiratory Failure () pulm edema   Plan:  Full ventilation support on board CXR contrinu elasix, see pcxr edema ,slight improved Need to reduce propofol needs and then wean SBT.Marland Kitchenwould do on prop of 30 or less SBT/WUA daily  CARDIO  Cardiac Arrest:  Likely due to hypoxemia from seizure or prolong QTc   Plan:  Normal catheterization per cardiology 2D TTE, pending result   RENAL  Hypokalemia 08/24/11   Plan: Ressolved Chem in am   GI  Distress   Plan: Best practice - Pepcid Start TF as Ross [name] to extubate today  ID  afebrile     HEME  Anemia   Plan:  Will continue to monitor Sub q heparin  ENDO      Global Issues  Rash with some lichenification, steroids to continue Will call derm May apply topical steroids   LOS: 2 days   Additional comments:I have discussed and reviewed with family members patient's   Critical Care Total Time*: 30 Minutes  Ross [name], Ross [name] 08/25/2011  *Care during the described time interval was provided by me and/or other  providers on the critical care team.  I have reviewed this patient's available data, including medical history, events of note, physical examination and test results as part of my evaluation.

## 2011-08-26 ENCOUNTER — Inpatient Hospital Stay (HOSPITAL_COMMUNITY): Payer: Non-veteran care

## 2011-08-26 ENCOUNTER — Other Ambulatory Visit: Payer: Self-pay

## 2011-08-26 DIAGNOSIS — I4901 Ventricular fibrillation: Secondary | ICD-10-CM

## 2011-08-26 DIAGNOSIS — I4581 Long QT syndrome: Secondary | ICD-10-CM

## 2011-08-26 DIAGNOSIS — R569 Unspecified convulsions: Secondary | ICD-10-CM

## 2011-08-26 LAB — COMPREHENSIVE METABOLIC PANEL
Alkaline Phosphatase: 88 U/L (ref 39–117)
BUN: 20 mg/dL (ref 6–23)
Creatinine, Ser: 0.9 mg/dL (ref 0.50–1.35)
GFR calc Af Amer: >90 mL/min (ref >90)
Glucose, Bld: 129 mg/dL — ABNORMAL HIGH (ref 70–99)
Potassium: 4 mEq/L (ref 3.5–5.1)
Total Bilirubin: 0.5 mg/dL (ref 0.3–1.2)
Total Protein: 6.4 g/dL (ref 6.0–8.3)

## 2011-08-26 LAB — GLUCOSE, CAPILLARY
Glucose-Capillary: 149 mg/dL — ABNORMAL HIGH (ref 70–99)
Glucose-Capillary: 153 mg/dL — ABNORMAL HIGH (ref 70–99)
Glucose-Capillary: 201 mg/dL — ABNORMAL HIGH (ref 70–99)
Glucose-Capillary: 179 mg/dL — ABNORMAL HIGH (ref 70–99)

## 2011-08-26 MED ORDER — GADOBENATE DIMEGLUMINE 529 MG/ML IV SOLN
20.0000 mL | Freq: Once | INTRAVENOUS | Status: AC | PRN
  Administered 2011-08-26: 20 mL via INTRAVENOUS

## 2011-08-26 MED ORDER — LORAZEPAM 2 MG/ML IJ SOLN
4.0000 mg | Freq: Once | INTRAMUSCULAR | Status: AC
  Administered 2011-08-26: 4 mg via INTRAVENOUS

## 2011-08-26 MED ORDER — SODIUM CHLORIDE 0.9 % IV SOLN
1500.0000 mg | Freq: Once | INTRAVENOUS | Status: AC
  Administered 2011-08-26: 1500 mg via INTRAVENOUS
  Filled 2011-08-26: qty 30

## 2011-08-26 MED ORDER — POTASSIUM CHLORIDE 20 MEQ/15ML (10%) PO LIQD
ORAL | Status: AC
  Filled 2011-08-26: qty 30

## 2011-08-26 MED ORDER — LORAZEPAM 2 MG/ML IJ SOLN
INTRAMUSCULAR | Status: AC
  Administered 2011-08-26: 15:00:00
  Filled 2011-08-26: qty 2

## 2011-08-26 MED ORDER — LORAZEPAM 2 MG/ML IJ SOLN
INTRAMUSCULAR | Status: AC
  Administered 2011-08-26: 14:00:00
  Filled 2011-08-26: qty 1

## 2011-08-26 MED ORDER — METHYLPREDNISOLONE SODIUM SUCC 40 MG IJ SOLR
40.0000 mg | Freq: Every day | INTRAMUSCULAR | Status: DC
  Administered 2011-08-27 – 2011-08-29 (×3): 40 mg via INTRAVENOUS
  Filled 2011-08-26 (×3): qty 1

## 2011-08-26 MED ORDER — PHENYTOIN SODIUM 50 MG/ML IJ SOLN
100.0000 mg | Freq: Three times a day (TID) | INTRAMUSCULAR | Status: DC
  Administered 2011-08-26 – 2011-08-31 (×15): 100 mg via INTRAVENOUS
  Filled 2011-08-26 (×17): qty 2

## 2011-08-26 NOTE — Progress Notes (Signed)
UR Completed.  Ross [name] 08/26/2011  

## 2011-08-26 NOTE — Consult Note (Signed)
Reason for Consult: SZ and AMS Referring Physician: Celestia Khat  CC: SZ and AMS  HPI: Mr. Ross [name] is a 45 year old male with a past medical history of eczema. He has seen a dermatologist for this in the past. The patient has developed a contact dermatitis triggered by changing soap. He subsequently developed a diffuse body rash and his [facility] physician prescribed him Vistaril. The patient has taken 10 (ten) 25 mg capsules of Vistaril over the past 24 hours and also has taken a bottle of over-the-counter liquid Benadryl. He subsequently developed tremulousness and muscle jerks. His wife brought him to the hospital for evaluation where he was observed to have a tonic-clonic seizure. There was question if this was related to anticholinergic poisoning. Poison control had been notified. Recommendations were to use benzodiazepines for any seizure activity. On 08/23/11 patient  went into [place], was shocked, was transiently bradycardic, then went into V-fib and was defribrillated again. He received epinephrine x1 and atropine. He was sedated.  Initial CT head negative on 08/24/11.     Heart cath on 08/24/11 Normal left main coronary artery. Normal left anterior descending artery and its branches. Normal left circumflex artery and its branches. Normal right coronary artery. Did not evaluate left ventricular systolic function. LVEDP 40 mmHg.   2 D Echo: The cavity size was normal. There was mild focal basal hypertrophy of the septum. Systolic function was mildly reduced. The estimated ejection fraction was in the range of 45% to 50%. Wall motion was normal; there were no regional wall motion abnormalities.  Glucose ranging from 129-201 EKG NSR AST dropped from 66-38  Now intubated and on propofol.  Just received ativan for seizure like activity.    Past Medical History  Diagnosis Date  . Sleep apnea     had surgery to correct it  . Mental disorder     PTSD  . Seizures     currently dx with  seizures  . Cardiac arrest 08/23/2011    Past Surgical History  Procedure Date  . Tonsillectomy   . Uvulopalatopharyngoplasty, tonsillectomy and septoplasty 06/16/2000    Family History  Problem Relation Age of Onset  . Heart attack Mother     Social History:  reports that he has never smoked. He quit smokeless tobacco use about 3 years ago. He reports that he drinks about .5 ounces of alcohol per week. He reports that he does not use illicit drugs.  No Known Allergies  Medications:  Scheduled:   . antiseptic oral rinse  15 mL Mouth Rinse Q4H  . calcium gluconate  2 g Intravenous Once  . chlorhexidine  15 mL Mouth/Throat BID  . cloNIDine  0.1 mg Oral BID  . famotidine (PEPCID) IV  20 mg Intravenous Q12H  . feeding supplement  30 mL Per Tube 5 X Daily  . furosemide  40 mg Intravenous Q12H  . heparin subcutaneous  5,000 Units Subcutaneous Q8H  . LORazepam      . LORazepam  2 mg Intravenous Q6H  . magnesium sulfate IVPB  3 g Intravenous Once  . methylPREDNISolone (SOLU-MEDROL) injection  60 mg Intravenous Q12H  . potassium chloride  40 mEq Per Tube BID  . sodium chloride  1,000 mL Intravenous Once    Review of Systems - General ROS: negative for - chills, fatigue, fever or hot flashes Hematological and Lymphatic ROS: negative for - bruising, fatigue, jaundice or pallor Endocrine ROS: negative for - hair pattern changes, hot flashes, mood swings or skin changes  Respiratory ROS: negative for - cough, hemoptysis, orthopnea or wheezing Cardiovascular ROS: negative for - dyspnea on exertion, orthopnea, palpitations or shortness of breath Gastrointestinal ROS: negative for - abdominal pain, appetite loss, blood in stools, diarrhea or hematemesis Musculoskeletal ROS: negative for - joint pain, joint stiffness, joint swelling or muscle pain Neurological ROS: negative Dermatological ROS: negative for dry skin, pruritus and rash   Blood pressure 127/90, pulse 64, temperature 98 F  (36.7 C), temperature source Oral, resp. rate 16, height 5\' 11"  (1.803 m), weight 105.3 kg (232 lb 2.3 oz), SpO2 100.00%.  Neurologic Examination: Mental Status: Intubated and on propofol Cranial Nerves: Pupils reactive 69mm-3mm, (+) gag, (+) corneal, winces bilaterally to periorbital pressure, (+) dolls Motor:moves bilateral UE and LE spontaneous and purposefully.  L>R.  Tone and bulk WNL.   Sensory: withdrawal to pain bilaterally but more on Left than right Deep Tendon Reflexes: 2+ and symmetric throughout Plantars: Downgoing bilaterally     Results for orders placed during the hospital encounter of 08/23/11 (from the past 48 hour(s))  GLUCOSE, CAPILLARY     Status: Abnormal   Collection Time   08/24/11  2:51 PM      Component Value Range Comment   Glucose-Capillary 172 (*) 70 - 99 (mg/dL)   GLUCOSE, CAPILLARY     Status: Abnormal   Collection Time   08/24/11  4:38 PM      Component Value Range Comment   Glucose-Capillary 164 (*) 70 - 99 (mg/dL)   POCT I-STAT 3, BLOOD GAS (G3+)     Status: Abnormal   Collection Time   08/24/11  6:58 PM      Component Value Range Comment   pH, Arterial 7.378  7.350 - 7.450     pCO2 arterial 41.1  35.0 - 45.0 (mmHg)    pO2, Arterial 65.0 (*) 80.0 - 100.0 (mmHg)    Bicarbonate 24.2 (*) 20.0 - 24.0 (mEq/L)    TCO2 25  0 - 100 (mmol/L)    O2 Saturation 92.0      Acid-base deficit 1.0  0.0 - 2.0 (mmol/L)    Patient temperature 37.0 C      Collection site RADIAL, ALLEN'S TEST ACCEPTABLE      Drawn by Operator      Sample type ARTERIAL     GLUCOSE, CAPILLARY     Status: Abnormal   Collection Time   08/24/11  8:26 PM      Component Value Range Comment   Glucose-Capillary 155 (*) 70 - 99 (mg/dL)    Comment 1 Documented in Chart     GLUCOSE, CAPILLARY     Status: Abnormal   Collection Time   08/24/11 11:43 PM      Component Value Range Comment   Glucose-Capillary 159 (*) 70 - 99 (mg/dL)   POCT I-STAT 3, BLOOD GAS (G3+)     Status: Abnormal    Collection Time   08/25/11  2:06 AM      Component Value Range Comment   pH, Arterial 7.359  7.350 - 7.450     pCO2 arterial 48.4 (*) 35.0 - 45.0 (mmHg)    pO2, Arterial 75.0 (*) 80.0 - 100.0 (mmHg)    Bicarbonate 27.3 (*) 20.0 - 24.0 (mEq/L)    TCO2 29  0 - 100 (mmol/L)    O2 Saturation 94.0      Acid-Base Excess 1.0  0.0 - 2.0 (mmol/L)    Collection site RADIAL, ALLEN'S TEST ACCEPTABLE      Drawn  by RT      Sample type ARTERIAL     CBC     Status: Abnormal   Collection Time   08/25/11  2:50 AM      Component Value Range Comment   WBC 8.5  4.0 - 10.5 (K/uL)    RBC 4.47  4.22 - 5.81 (MIL/uL)    Hemoglobin 13.1  13.0 - 17.0 (g/dL)    HCT 11.9  14.7 - 82.9 (%)    MCV 90.6  78.0 - 100.0 (fL)    MCH 29.3  26.0 - 34.0 (pg)    MCHC 32.3  30.0 - 36.0 (g/dL)    RDW 56.2  13.0 - 86.5 (%)    Platelets 102 (*) 150 - 400 (K/uL) CONSISTENT WITH PREVIOUS RESULT  MAGNESIUM     Status: Normal   Collection Time   08/25/11  2:50 AM      Component Value Range Comment   Magnesium 1.8  1.5 - 2.5 (mg/dL)   CALCIUM, IONIZED     Status: Normal   Collection Time   08/25/11  2:50 AM      Component Value Range Comment   Calcium, Ion 1.13  1.12 - 1.32 (mmol/L)   DIFFERENTIAL     Status: Abnormal   Collection Time   08/25/11  2:50 AM      Component Value Range Comment   Neutrophils Relative 89 (*) 43 - 77 (%)    Neutro Abs 7.6  1.7 - 7.7 (K/uL)    Lymphocytes Relative 3 (*) 12 - 46 (%)    Lymphs Abs 0.3 (*) 0.7 - 4.0 (K/uL)    Monocytes Relative 8  3 - 12 (%)    Monocytes Absolute 0.7  0.1 - 1.0 (K/uL)    Eosinophils Relative 0  0 - 5 (%)    Eosinophils Absolute 0.0  0.0 - 0.7 (K/uL)    Basophils Relative 0  0 - 1 (%)    Basophils Absolute 0.0  0.0 - 0.1 (K/uL)   COMPREHENSIVE METABOLIC PANEL     Status: Abnormal   Collection Time   08/25/11  2:50 AM      Component Value Range Comment   Sodium 139  135 - 145 (mEq/L)    Potassium 4.3  3.5 - 5.1 (mEq/L)    Chloride 103  96 - 112 (mEq/L)    CO2 28   19 - 32 (mEq/L)    Glucose, Bld 149 (*) 70 - 99 (mg/dL)    BUN 15  6 - 23 (mg/dL)    Creatinine, Ser 7.84  0.50 - 1.35 (mg/dL)    Calcium 9.0  8.4 - 10.5 (mg/dL)    Total Protein 6.2  6.0 - 8.3 (g/dL)    Albumin 2.7 (*) 3.5 - 5.2 (g/dL)    AST 66 (*) 0 - 37 (U/L)    ALT 50  0 - 53 (U/L)    Alkaline Phosphatase 89  39 - 117 (U/L)    Total Bilirubin 0.6  0.3 - 1.2 (mg/dL)    GFR calc non Af Amer 86 (*) >90 (mL/min)    GFR calc Af Amer >90  >90 (mL/min)   GLUCOSE, CAPILLARY     Status: Abnormal   Collection Time   08/25/11  4:17 AM      Component Value Range Comment   Glucose-Capillary 130 (*) 70 - 99 (mg/dL)    Comment 1 Documented in Chart      Comment 2 Notify RN  GLUCOSE, CAPILLARY     Status: Abnormal   Collection Time   08/25/11  8:04 AM      Component Value Range Comment   Glucose-Capillary 151 (*) 70 - 99 (mg/dL)   GLUCOSE, CAPILLARY     Status: Abnormal   Collection Time   08/25/11 11:06 AM      Component Value Range Comment   Glucose-Capillary 157 (*) 70 - 99 (mg/dL)   MRSA PCR SCREENING     Status: Normal   Collection Time   08/25/11 11:31 AM      Component Value Range Comment   MRSA by PCR NEGATIVE  NEGATIVE    GLUCOSE, CAPILLARY     Status: Abnormal   Collection Time   08/25/11  4:14 PM      Component Value Range Comment   Glucose-Capillary 145 (*) 70 - 99 (mg/dL)   GLUCOSE, CAPILLARY     Status: Abnormal   Collection Time   08/25/11  7:59 PM      Component Value Range Comment   Glucose-Capillary 164 (*) 70 - 99 (mg/dL)    Comment 1 Documented in Chart      Comment 2 Notify RN     GLUCOSE, CAPILLARY     Status: Abnormal   Collection Time   08/25/11 11:40 PM      Component Value Range Comment   Glucose-Capillary 172 (*) 70 - 99 (mg/dL)    Comment 1 Documented in Chart      Comment 2 Notify RN     GLUCOSE, CAPILLARY     Status: Abnormal   Collection Time   08/26/11  3:54 AM      Component Value Range Comment   Glucose-Capillary 149 (*) 70 - 99 (mg/dL)     COMPREHENSIVE METABOLIC PANEL     Status: Abnormal   Collection Time   08/26/11  4:55 AM      Component Value Range Comment   Sodium 145  135 - 145 (mEq/L)    Potassium 4.0  3.5 - 5.1 (mEq/L)    Chloride 106  96 - 112 (mEq/L)    CO2 30  19 - 32 (mEq/L)    Glucose, Bld 129 (*) 70 - 99 (mg/dL)    BUN 20  6 - 23 (mg/dL)    Creatinine, Ser 0.98  0.50 - 1.35 (mg/dL)    Calcium 8.9  8.4 - 10.5 (mg/dL)    Total Protein 6.4  6.0 - 8.3 (g/dL)    Albumin 2.6 (*) 3.5 - 5.2 (g/dL)    AST 38 (*) 0 - 37 (U/L)    ALT 36  0 - 53 (U/L)    Alkaline Phosphatase 88  39 - 117 (U/L)    Total Bilirubin 0.5  0.3 - 1.2 (mg/dL)    GFR calc non Af Amer >90  >90 (mL/min)    GFR calc Af Amer >90  >90 (mL/min)   GLUCOSE, CAPILLARY     Status: Abnormal   Collection Time   08/26/11  7:19 AM      Component Value Range Comment   Glucose-Capillary 153 (*) 70 - 99 (mg/dL)   GLUCOSE, CAPILLARY     Status: Abnormal   Collection Time   08/26/11 11:20 AM      Component Value Range Comment   Glucose-Capillary 201 (*) 70 - 99 (mg/dL)     Ct Head Wo Contrast  08/24/2011  *RADIOLOGY REPORT*  Clinical Data: Seizure.  Cardiac arrest.  Evaluate for infarct.  CT  HEAD WITHOUT CONTRAST  Technique:  Contiguous axial images were obtained from the base of the skull through the vertex without contrast.  Comparison: None.  Findings: There is no evidence for acute hemorrhage, hydrocephalus, mass lesion, or abnormal extra-axial fluid collection.  No definite CT evidence for acute infarction.  The visualized paranasal sinuses and mastoid air cells are clear.  IMPRESSION: Normal exam.  Original Report Authenticated By: ERIC A. MANSELL, M.D.   Dg Chest Portable 1 View  08/26/2011  *RADIOLOGY REPORT*  Clinical Data: Check endotracheal tube after placement.  PORTABLE CHEST - 1 VIEW  Comparison: 08/25/2011.  Findings: Endotracheal tube tip 11 cm above the carina at the lower neck/thoracic inlet junction.  Correlation with breath sounds  recommended to confirm this is within the trachea and not in the esophagus.  Nasogastric tube curled within the stomach.  The tip is not included present exam.  Right central line tip projects over the mid to distal superior vena cava level.  No gross pneumothorax.  Asymmetric pulmonary edema most notable perihilar region greater on the right has improved slightly since the prior exam.  Question pulmonary edema versus infectious infiltrate.  IMPRESSION: Endotracheal tube tip 11 cm above the carina at the lower neck/thoracic inlet junction.  Correlation with breath sounds recommended to confirm this is within the trachea and not in the esophagus.  Asymmetric pulmonary edema most notable perihilar region greater on the right has improved slightly since the prior exam.  Question pulmonary edema versus infectious infiltrate.  Original Report Authenticated By: Fuller Canada, M.D.   Dg Chest Portable 1 View  08/25/2011  *RADIOLOGY REPORT*  Clinical Data: Check endotracheal tube, shortness of breath  PORTABLE CHEST - 1 VIEW  Comparison: 08/24/2011  Findings: Endotracheal tube at the thoracic inlet, terminating 7 cm above the carina.  Enteric tube looped in the stomach.  Right subclavian venous catheter terminating at the cavoatrial junction.  Patchy bilateral airspace opacities, possibly reflecting edema and/or infection.  No pleural effusion or pneumothorax.  The heart is normal in size.  IMPRESSION: Endotracheal tube  terminates 10 cm above the carina.  Stable support apparatus, as above.  Patchy bilateral airspace opacities, possibly reflecting edema and/or infection.  Original Report Authenticated By: Charline Bills, M.D.   Dg Chest Portable 1 View  08/24/2011  *RADIOLOGY REPORT*  Clinical Data: Respiratory distress  PORTABLE CHEST - 1 VIEW  Comparison: 08/24/2011 8:50 hours  Findings: Under aerated lungs.  Diffuse bilateral airspace disease is worse.  Tubular devices are unchanged.  No pneumothorax.  Normal  heart size.  IMPRESSION: Worsening diffuse bilateral airspace disease.  Original Report Authenticated By: Donavan Burnet, M.D.     Assessment/Plan: 45 year old male with a past medical history of eczema.  The patient has developed a contact dermatitis triggered by changing soap. He subsequently developed a diffuse body rash and his [facility] physician prescribed him Vistaril. The patient has taken 10 (ten) 25 mg capsules of Vistaril over the past 24 hours and also has taken a bottle of over-the-counter liquid Benadryl. He subsequently developed tremulousness and muscle jerks. Them went into V-fib while in hospital.    EEG 08-24-11 suggest a moderate-to-severe encephalopathy of nonspecific etiology. No epileptiform activity. 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 The initial CT head may not have shown it as it was done immediately after  the cardiac arrest.   Agree with MRI brain without contrast  Agree with EEG   Felicie Morn PA-C [facility]  Neuro[facility]ist [phone]  08/26/2011, 2:00 PM

## 2011-08-26 NOTE — Progress Notes (Signed)
Follow up - Critical Care Medicine Note  Patient Details:    Ross [name] is an 45 y.o. male admitted to [facility] on 11/6 after having a seizure at home. Had been taking Benadryl for a rash. Admitted to floor, seized again night of 08/23/11 in front of family, then went into cardiac arrest. He went into [facility], was shocked, was transiently bradycardic, then went into V-fib and was defribrillated again. He received epinephrine x1 and atropine.   After arrival to the ICU, he was combative and tried to speak, making purposeful movements. He was sedated. QTc prolonged . Tx to [facility] 11/7 for cards evaluation.  Lines, Airways, Drains: Intubation and on ventilation 08/23/11 >>> Right IJ triple lumen catheter 08/23/11 >>> NG/OG tube 08/23/11 >>>  Anti-infectives:  No antibiotic on board  Microbiology: Results for orders placed during the hospital encounter of 08/23/11  MRSA PCR SCREENING     Status: Normal   Collection Time   08/25/11 11:31 AM      Component Value Range Status Comment   MRSA by PCR NEGATIVE  NEGATIVE  Final     Best Practice/Protocols:  GI:  NPO, Pepcid Analgesic:  Fentanyl Sedation:  Precedex DVT:  SQ heparin  Events: 11/7-8- follows commands well 11/9- seziure 11/7 eeg- delta wave temporal, no focus, enceph  Studies:  PORTABLE CHEST - 1 VIEW Comparison:  08/25/2011.   Findings: Endotracheal tube tip 11 cm above the carina at the lower  neck/thoracic inlet junction. Correlation with breath sounds recommended to confirm this is within the trachea and not in the  esophagus. Nasogastric tube curled within the stomach. The tip is not included present exam. Right central line tip projects over the mid to distal superior vena cava level. No gross pneumothorax. Asymmetric pulmonary edema most notable perihilar region greater on the right has improved slightly since the prior exam. Question pulmonary edema versus infectious infiltrate.  IMPRESSION: Endotracheal tube tip 11  cm above the carina at the lower neck/thoracic inlet junction. Correlation with breath sounds recommended to confirm this is within the trachea and not in the esophagus. Asymmetric pulmonary edema most notable perihilar region greater on the right has improved slightly since the prior exam.  Question pulmonary edema versus infectious infiltrate.  PORTABLE CHEST 1 VIEW 08/25/11:   IMPRESSION: Endotracheal tube terminates 10 cm above the carina. Stable  support apparatus, as above. Patchy bilateral airspace opacities, possibly reflecting edema and/or infection.  Ct Head Wo Contrast 08/24/2011:  There is no evidence for acute hemorrhage, hydrocephalus, mass lesion, or abnormal extra-axial fluid collection.  No definite CT evidence for acute infarction.  The visualized paranasal sinuses and mastoid air cells are clear.  IMPRESSION: Normal exam.   Dg Chest Portable 1 View:  08/25/2011  *RADIOLOGY REPORT*  Clinical Data: Check endotracheal tube, shortness of breath  PORTABLE CHEST - 1 VIEW  Comparison: 08/24/2011  Findings: Endotracheal tube at the thoracic inlet, terminating 7 cm above the carina.  Enteric tube looped in the stomach.  Right subclavian venous catheter terminating at the cavoatrial junction.  Patchy bilateral airspace opacities, possibly reflecting edema and/or infection.  No pleural effusion or pneumothorax.  The heart is normal in size.  IMPRESSION: Endotracheal tube  terminates 10 cm above the carina.  Stable support apparatus, as above.  Patchy bilateral airspace opacities, possibly reflecting edema and/or infection.  Original Report Authenticated By: Charline Bills, M.D.   Dg Chest Portable 1 View  08/24/2011  *RADIOLOGY REPORT*  Clinical Data: Respiratory distress  PORTABLE CHEST - 1 VIEW  Comparison: 08/24/2011 8:50 hours  Findings: Under aerated lungs.  Diffuse bilateral airspace disease is worse.  Tubular devices are unchanged.  No pneumothorax.  Normal heart size.  IMPRESSION:  Worsening diffuse bilateral airspace disease.  Original Report Authenticated By: Donavan Burnet, M.D.   Dg Chest Portable 1 View  08/24/2011  *RADIOLOGY REPORT*  Clinical Data: Evaluate endotracheal tube placement  PORTABLE CHEST - 1 VIEW  Comparison: 08/23/2011; 09/15/2006  Findings:  Grossly unchanged cardiac silhouette and mediastinal contours. Endotracheal tube overlies tracheal air column with tip superior to the carina.  Enteric tube terminates into the left hemidiaphragm. Unchanged positioning of a right subclavian approach central venous catheter with tip overlying the mid SVC.  Improved aeration of the lungs with persistent perihilar heterogeneous opacities.  Mild cephalization of flow without frank evidence of pulmonary edema. No definite pneumothorax or pleural effusion.  Grossly unchanged bones.  IMPRESSION: 1.  Appropriately positioned support apparatus as above.  No definite pneumothorax. 2.  Improved aeration of the lungs with persistent perihilar opacities, possibly atelectasis though infection is not excluded.  Original Report Authenticated By: Waynard Reeds, M.D.   Dg Chest Portable 1 View  08/23/2011  *RADIOLOGY REPORT*  Clinical Data: Respiratory difficulty  PORTABLE CHEST - 1 VIEW  Comparison: 09/15/2006  Findings: Low lung volumes.  Extensive bilateral diffuse airspace disease with a central distribution.  Endotracheal tube placed. Tip is 4.2 cm from carina.  Right subclavian central venous catheter tip at the cavoatrial junction.  No pneumothorax.  IMPRESSION: Endotracheal tube.  Right subclavian central venous catheter tip at the cavoatrial junction and no pneumothorax per  Bilateral diffuse airspace disease.  Original Report Authenticated By: Donavan Burnet, M.D.    Consults: Treatment Team:  Corky Crafts  Neuro 11/9  Subjective:    Overnight Issues:  seziure today without arrythmia associated 11/9  Objective:  Vital signs for last 24 hours: Temp:  [97.5 F  (36.4 C)-99 F (37.2 C)] 98.9 F (37.2 C) (11/09 0744) Pulse Rate:  [63-98] 63  (11/09 0727) Resp:  [10-17] 16  (11/09 0727) BP: (105-138)/(65-97) 134/92 mmHg (11/09 0744) SpO2:  [89 %-100 %] 99 % (11/09 0727) FiO2 (%):  [60 %-100 %] 60 % (11/09 0744) Weight:  [105.3 kg (232 lb 2.3 oz)] 232 lb 2.3 oz (105.3 kg) (11/09 0500)  Hemodynamic parameters for last 24 hours: CVP:  [0 mmHg-231 mmHg] 231 mmHg  Intake/Output from previous day: 11/08 0701 - 11/09 0700 In: 1112 [I.V.:250; NG/GT:760; IV Piggyback:102] Out: 4100 [Urine:4100]   Intake/Output this shift: Total I/O In: 70 [NG/GT:70] Out: 250 [Urine:250]  Vent settings for last 24 hours: Vent Mode:  [-] PRVC FiO2 (%):  [60 %-100 %] 60 % Set Rate:  [16 bmp] 16 bmp Vt Set:  [500 mL] 500 mL PEEP:  [5 cmH20] 5 cmH20 Plateau Pressure:  [8 cmH20-16 cmH20] 15 cmH20  Physical Exam:  General:  Arousable and responsive to tapping HEENT:  NCAT, no JVD, no LAD, some saliva discharge while examinating LUNGS:  Coarse, no wheeze HEART:  RRR, no murmurs/rubs/gallps ABD:  (+)BS, soft, nontender, nondistended, no guarding EXTR:  Intact, no cyanosis, no rashes, no edema SKIN:  Rash on necklace line is improving and most of the right side has now dry and slough off.  Calamine lotion on rashes noted.  Assessment/Plan:   NEURO  Seizure reoccurent 11/9 without arrytmia Requires high sedation needs   Plan:  Tox screen negative Benzodiazepines as  needed and frequent neuro check Ativan load, dilantin load Neuro called Mri brain r/o mass lesion WUA daily Ct head neg No role for LP, follow commands 11/7, 11/8..will lp with neuro wish i see no role acylcovir at this atge given clininal above Add ativan around clock in hopes to reduce propofol needs Re add propofol   PULM  Acute Respiratory Failure () Pulmonary edema   Plan:  Full ventilation support on board CXR Continue  Lasix, see pcxr edema ,slight improved and tolerated  well Attempted weanig this am cpap5 ps 5 failed Now remain itubated with seziures   CARDIO  Cardiac Arrest:  Likely due to hypoxemia from seizure or prolong QTc   Plan:  Normal catheterization per cardiology 2D TTE, pending result   RENAL  Hypokalemia 08/24/11   Plan: Ressolved Chem in am   GI  Distress   Plan: Best practice - Pepcid  TF  ID  afebrile   Will LP if neuro wish, Indication?? In ??   HEME  Anemia   Plan:  Will continue to monitor Sub q heparin  ENDO      Global Issues  Rash with some lichenification, steroids to continue Will call derm in future, it is a 45 yr old rash and improved, to daily solumedral May apply topical steroids   LOS: 3 days   Additional comments:  Critical Care Total Time*: 30 Minutes  DANG, DONG 08/26/2011  *Care during the described time interval was provided by me and/or other providers on the critical care team.  I have reviewed this patient's available data, including medical history, events of note, physical examination and test results as part of my evaluation.

## 2011-08-26 NOTE — Plan of Care (Signed)
Problem: Phase I Progression Outcomes Goal: Maintaining airway and VS stable Outcome: Progressing Pt. Intubated prior to having seizure

## 2011-08-26 NOTE — Procedures (Signed)
EEG NUMBER:  [id number].  This routine EEG was requested in this 45 year old man who is status post in-house ventricular fibrillation arrest.  He had a previous tonic- clonic seizure prior to a previous EEG.  He was attempted to be weaned from the vent today and awakened and had 3 seizures within 4 hours.  He has now been re-sedated and is on the vent and unresponsive.  His medications include lorazepam, fentanyl, midazolam, Dilantin and propofol.  The EEG was done with the patient unresponsive.  Background activities were characterized by a predominance of low-amplitude alpha activities that were interrupted by brief periods of suppression.  These alpha activities were frontally dominant and also occupied the high theta frequency range.  During the periods of suppression, the alpha activity could still be seen but was of a much lower amplitude.  Typically, the periods of suppression last 4-5 seconds, and the periods of higher amplitude activity varied from 2-3 seconds  to as much as 20 seconds. With stimulation, there did seem to be an increased amount of faster background activities in the beta range.  Photic stimulation did not produce a driving response.  No normal activities of sleep were seen.  CLINICAL INTERPRETATION:  This routine EEG done with the patient sedated and unresponsive is abnormal.  Background activities took on a burst suppression-like pattern with the bursts being composed of widespread frontally dominant alpha and high-frequency theta activities.  This suggests a moderate encephalopathy.  This pattern is commonly seen with IV anesthesia.  No interictal epileptiform discharges, electrographic seizures, or nonconvulsive status epilepticus was seen.          ______________________________ Denton Meek, MD    IO:NGEX D:  08/26/2011 18:08:56  T:  08/26/2011 18:56:58  Job #:  [id number]

## 2011-08-26 NOTE — Progress Notes (Signed)
MEDICATION RELATED CONSULT NOTE - INITIAL   Pharmacy Consult for Phenytion  Indication: New Seizures   No Known Allergies  Patient Measurements: Height: 5\' 11"  (180.3 cm) Weight: 232 lb 2.3 oz (105.3 kg) IBW/kg (Calculated) : 75.3  Adjusted Body Weight:  88 kg  Vital Signs: Temp: 98 F (36.7 C) (11/09 1117) Temp src: Oral (11/09 1117) BP: 127/90 mmHg (11/09 1300) Pulse Rate: 64  (11/09 1000) Intake/Output from previous day: 11/08 0701 - 11/09 0700 In: 1112 [I.V.:250; NG/GT:760; IV Piggyback:102] Out: 4100 [Urine:4100] Intake/Output from this shift: Total I/O In: 425 [NG/GT:375; IV Piggyback:50] Out: 250 [Urine:250]  Labs:  [facility] 08/26/11 0455 08/25/11 0250 08/24/11 1400 08/24/11 0525 08/24/11 0407 08/23/11 2205  WBC -- 8.5 9.3 9.9 -- --  HGB -- 13.1 12.9* 12.9* -- --  HCT -- 40.5 39.3 37.9* -- --  PLT -- 102* 107* 115* -- --  APTT -- -- -- -- -- 26  CREATININE 0.90 1.04 0.91 -- -- --  LABCREA -- -- -- -- -- --  CREATININE 0.90 1.04 0.91 -- -- --  CREAT24HRUR -- -- -- -- -- --  MG -- 1.8 -- -- 2.3 1.9  PHOS -- -- -- -- -- 3.4  ALBUMIN 2.6* 2.7* -- -- -- --  PROT 6.4 6.2 -- -- -- --  ALBUMIN 2.6* 2.7* -- -- -- --  AST 38* 66* -- -- -- --  ALT 36 50 -- -- -- --  ALKPHOS 88 89 -- -- -- --  BILITOT 0.5 0.6 -- -- -- --  BILIDIR -- -- -- -- -- --  IBILI -- -- -- -- -- --   Estimated Creatinine Clearance: 130.7 ml/min (by C-G formula based on Cr of 0.9).   Microbiology: Recent Results (from the past 720 hour(s))  MRSA PCR SCREENING     Status: Normal   Collection Time   08/25/11 11:31 AM      Component Value Range Status Comment   MRSA by PCR NEGATIVE  NEGATIVE  Final     Medical History: Past Medical History  Diagnosis Date  . Sleep apnea     had surgery to correct it  . Mental disorder     PTSD  . Seizures     currently dx with seizures  . Cardiac arrest 08/23/2011    Medications:  Prescriptions prior to admission  Medication Sig Dispense  Refill  . cetaphil (CETAPHIL) cream Apply topically as needed. Apply after bathing       . diphenhydrAMINE (BENADRYL) 25 MG tablet Take 25 mg by mouth every 6 (six) hours as needed.        Marland Kitchen gentamicin (GARAMYCIN) 0.1 % ointment Apply 1 application topically. After bathing       . hydrOXYzine (ATARAX/VISTARIL) 25 MG tablet Take 25 mg by mouth every 6 (six) hours as needed.       . loratadine (CLARITIN) 10 MG tablet Take 10 mg by mouth daily.          Assessment: Patient admitted, intubated and sedated on propofol and prn ativan after witnessed seizure and then cardiac arrest.  He took excessive amounts of diphenhydramine for rash.  He continues to have Sz activity and will start IV phenytoin load 1500 mg (17mg /kg) adjusted body wt 88kg and start phenytoin 100 mg IV q8.    Goal of Therapy:  Phenytoin level 10-20  Plan:  Phenytoin 1500mg  IV load x1 then 100mg  iv q8  Marcelino Scot 08/26/2011,2:46 PM

## 2011-08-26 NOTE — Plan of Care (Signed)
Problem: Phase II Progression Outcomes Goal: Pain controlled Outcome: Not Progressing Pt. Remains on fentanyl drip. Goal: EP Study prep and care per orders Outcome: Progressing Dr. Johney Frame consulted for EP.

## 2011-08-26 NOTE — Progress Notes (Signed)
  Echocardiogram 2D Echocardiogram has been performed.  HAIRSTON, Levana Minetti 08/26/2011, 12:37 PM

## 2011-08-26 NOTE — Consult Note (Signed)
ELECTROPHYSIOLOGY CONSULT NOTE  Referring Physician:  Dr Eldridge Dace  Admit Date: 08/23/2011  Reason for consultation: prolonged QT and ventricular fibrillation  Ross [name] is a 45 y.o. patient with a h/o sleep apnea and PTSD who presents following a sudden cardiac arrest.  He remains intubated at this time and history is therefore per medical record. Per report he recently consumed a large amount of benadryl and hydroxyzine over a 24 hour period due to intense itching. Per report, the patient has taken 10 (ten) 25 mg capsules of Vistaril over the past 24 hours and also has taken most of a bottle of over-the-counter liquid Benadryl. He subsequently developed tremulousness and muscle jerks. His wife brought him to the hospital for evaluation where he was observed to have a tonic-clonic seizure. He was markedly hypertensive and febrile and diagnosed with anti-cholinergic toxicity. ECG revealed marked QT prolongation > . He was admitted to [facility] for observation.  While on the floor, he had VT/VF arrest (no strips available).He required external defibrillation and was (per report) shocked 3 times and intubated. He remains intubated. Attempts were made for extubation today, however, he had witness seizure activity and therefore remains sedated presently.  He has been initiated on dilantin per neurology.   Past Medical History  Diagnosis Date  . Sleep apnea     had surgery to correct it  . Mental disorder     PTSD  . Seizures     currently dx with seizures  . Cardiac arrest 08/23/2011   Past Surgical History  Procedure Date  . Tonsillectomy   . Uvulopalatopharyngoplasty, tonsillectomy and septoplasty 06/16/2000      . antiseptic oral rinse  15 mL Mouth Rinse Q4H  . calcium gluconate  2 g Intravenous Once  . chlorhexidine  15 mL Mouth/Throat BID  . cloNIDine  0.1 mg Oral BID  . famotidine (PEPCID) IV  20 mg Intravenous Q12H  . feeding supplement  30 mL Per Tube 5 X Daily  .  furosemide  40 mg Intravenous Q12H  . heparin subcutaneous  5,000 Units Subcutaneous Q8H  . LORazepam      . LORazepam      . LORazepam  2 mg Intravenous Q6H  . LORazepam  4 mg Intravenous Once  . magnesium sulfate IVPB  3 g Intravenous Once  . methylPREDNISolone (SOLU-MEDROL) injection  40 mg Intravenous Daily  . phenytoin (DILANTIN) IV  1,500 mg Intravenous Once  . phenytoin (DILANTIN) IV  100 mg Intravenous Q8H  . potassium chloride  40 mEq Per Tube BID  . sodium chloride  1,000 mL Intravenous Once  . DISCONTD: methylPREDNISolone (SOLU-MEDROL) injection  60 mg Intravenous Q12H      . sodium chloride 20 mL/hr at 08/26/11 1500  . feeding supplement (PROMOTE) 1,000 mL (08/25/11 1641)  . fentaNYL infusion INTRAVENOUS 100 mcg/hr (08/26/11 1500)  . propofol 60 mcg/kg/min (08/26/11 1500)  . DISCONTD: feeding supplement (OSMOLITE 1.2 CAL)      No Known Allergies  SH- pt unable to provide  Family History  Problem Relation Age of Onset  . Heart attack Mother   pt unable to provide  ROS- pt unable to provide  Physical Exam: Filed Vitals:   08/26/11 0900 08/26/11 1000 08/26/11 1117 08/26/11 1300  BP: 138/98 132/95  127/90  Pulse: 57 64    Temp:   98 F (36.7 C)   TempSrc:   Oral   Resp: 16 16    Height:      Weight:  SpO2: 100% 100%      GEN- The patient is sedated and intubated    Head- normocephalic, atraumatic Eyes-  Sclera clear, conjunctiva pink Ears- unable to assess Oropharynx- ETT in place Neck- supple, no JVP Lymph- no cervical lymphadenopathy Lungs- Clear to ausculation bilaterally, normal work of breathing Heart- Regular rate and rhythm, no murmurs, rubs or gallops, PMI not laterally displaced GI- soft, NT, ND, + BS Extremities- no clubbing, cyanosis, or edema MS- no significant deformity or atrophy Skin- no rash or lesion Psych- euthymic mood, full affect Neuro- sedated  Multiple EKGs reviewed. EKG 08/26/11 reveals sinus rhythm 65 bpm with PVCs,  Qtc measures 555 msec EKG 2001 reviewed in muse-  QTc<400 at that time, frequent PVCs observed Tele strip from 08/23/11 reviewed which reveals nonsustained polymorphic VT   Labs:   Lab Results  Component Value Date   WBC 8.5 08/25/2011   HGB 13.1 08/25/2011   HCT 40.5 08/25/2011   MCV 90.6 08/25/2011   PLT 102* 08/25/2011    Lab 08/26/11 0455  NA 145  K 4.0  CL 106  CO2 30  BUN 20  CREATININE 0.90  CALCIUM 8.9  PROT 6.4  BILITOT 0.5  ALKPHOS 88  ALT 36  AST 38*  GLUCOSE 129*   Lab Results  Component Value Date   CKTOTAL 111 08/24/2011   CKMB 4.5* 08/24/2011   TROPONINI 0.91* 08/24/2011    No results found for this basename: CHOL   No results found for this basename: HDL   No results found for this basename: LDLCALC   No results found for this basename: TRIG   No results found for this basename: CHOLHDL   No results found for this basename: LDLDIRECT     ASSESSMENT AND PLAN:  Ross [name] is a gentleman now admitted with benadryl/ hydroxyzine overdose and anti-cholinergic toxicity. He has had profound QT prolongation with pause dependant torsades subsequently requiring defibrillation.  He remains intubated.  He has had several seizures in the absence of arrhythmia today.  He has been initiated on dilantin for this. Benadryl has been associated with qt prolongation in the setting of overdose.  This may be the cause for his ongoing QT prolongation and previous polymorphic VT.  EKG from 2001 does not suggest prolonged QT at that time, though he could certainly have a h/o prolonged qt.  I think that a full family history from the patient will be helpful once he is extubated.  At this time, I would recommend that we  Continue to monitor for arrhythmias in the ICU.  We will follow Qtc with daily EKGs.  I would avoid QT prolonging drugs!  Caution with dilantin which has been associated with ventricular arrhythmias.  Keep K>4,  Mg>1.9  Will follow and reassess next  week    Hillis Range, MD 08/26/2011 3:43 PM

## 2011-08-26 NOTE — Progress Notes (Signed)
SUBJECTIVE:  No events overnight.  OBJECTIVE:   Vitals:   Filed Vitals:   08/26/11 0300 08/26/11 0500 08/26/11 0727 08/26/11 0744  BP: 134/96  138/95 134/92  Pulse: 85  63   Temp: 97.5 F (36.4 C)   98.9 F (37.2 C)  TempSrc: Oral   Oral  Resp: 16  16   Height:      Weight: 105.3 kg (232 lb 2.3 oz) 105.3 kg (232 lb 2.3 oz)    SpO2: 96%  99%    I&O's:   Intake/Output Summary (Last 24 hours) at 08/26/11 4098 Last data filed at 08/26/11 1191  Gross per 24 hour  Intake    912 ml  Output   4350 ml  Net  -3438 ml   TELEMETRY: Reviewed telemetry pt inNSR     PHYSICAL EXAM General: Well developed, well nourished, in no acute distress Head:  No xanthomas.   Normal cephalic and atramatic  Lungs:   Clear bilaterally to auscultation and percussion. Heart:   HRRR S1 S2 Pulses are 2+ & equal.            No carotid bruit. No JVD.  No abdominal bruits. No femoral bruits. Abdomen:  abdomen soft and non-tenderMsk:  Back normal, normal gait. Normal strength and tone for age. Extremities:   No clubbing, cyanosis or edema.   Neuro: sedated    LABS: Basic Metabolic Panel:  Basename 08/26/11 0455 08/25/11 0250 08/24/11 0407 08/23/11 2205  NA 145 139 -- --  K 4.0 4.3 -- --  CL 106 103 -- --  CO2 30 28 -- --  GLUCOSE 129* 149* -- --  BUN 20 15 -- --  CREATININE 0.90 1.04 -- --  CALCIUM 8.9 9.0 -- --  MG -- 1.8 2.3 --  PHOS -- -- -- 3.4   Liver Function Tests:  [facility] 08/26/11 0455 08/25/11 0250  AST 38* 66*  ALT 36 50  ALKPHOS 88 89  BILITOT 0.5 0.6  PROT 6.4 6.2  ALBUMIN 2.6* 2.7*   No results found for this basename: LIPASE:2,AMYLASE:2 in the last 72 hours CBC:  Basename 08/25/11 0250 08/24/11 1400 08/24/11 0407  WBC 8.5 9.3 --  NEUTROABS 7.6 -- 9.1*  HGB 13.1 12.9* --  HCT 40.5 39.3 --  MCV 90.6 89.7 --  PLT 102* 107* --   Cardiac Enzymes:  Basename 08/24/11 0901 08/23/11 2205  CKTOTAL 111 323*  CKMB 4.5* 3.9  CKMBINDEX -- --  TROPONINI 0.91* 0.59*    BNP: No results found for this basename: POCBNP:3 in the last 72 hours D-Dimer: No results found for this basename: DDIMER:2 in the last 72 hours Hemoglobin A1C: No results found for this basename: HGBA1C in the last 72 hours Fasting Lipid Panel: No results found for this basename: CHOL,HDL,LDLCALC,TRIG,CHOLHDL,LDLDIRECT in the last 72 hours Thyroid Function Tests: No results found for this basename: TSH,T4TOTAL,FREET3,T3FREE,THYROIDAB in the last 72 hours Anemia Panel: No results found for this basename: VITAMINB12,FOLATE,FERRITIN,TIBC,IRON,RETICCTPCT in the last 72 hours Coag Panel:   Lab Results  Component Value Date   INR 0.99 08/24/2011   INR 0.99 08/23/2011    RADIOLOGY: Ct Head Wo Contrast  08/24/2011  *RADIOLOGY REPORT*  Clinical Data: Seizure.  Cardiac arrest.  Evaluate for infarct.  CT HEAD WITHOUT CONTRAST  Technique:  Contiguous axial images were obtained from the base of the skull through the vertex without contrast.  Comparison: None.  Findings: There is no evidence for acute hemorrhage, hydrocephalus, mass lesion, or abnormal extra-axial fluid  collection.  No definite CT evidence for acute infarction.  The visualized paranasal sinuses and mastoid air cells are clear.  IMPRESSION: Normal exam.  Original Report Authenticated By: ERIC A. MANSELL, M.D.   Dg Chest Portable 1 View  08/26/2011  *RADIOLOGY REPORT*  Clinical Data: Check endotracheal tube after placement.  PORTABLE CHEST - 1 VIEW  Comparison: 08/25/2011.  Findings: Endotracheal tube tip 11 cm above the carina at the lower neck/thoracic inlet junction.  Correlation with breath sounds recommended to confirm this is within the trachea and not in the esophagus.  Nasogastric tube curled within the stomach.  The tip is not included present exam.  Right central line tip projects over the mid to distal superior vena cava level.  No gross pneumothorax.  Asymmetric pulmonary edema most notable perihilar region greater on the  right has improved slightly since the prior exam.  Question pulmonary edema versus infectious infiltrate.  IMPRESSION: Endotracheal tube tip 11 cm above the carina at the lower neck/thoracic inlet junction.  Correlation with breath sounds recommended to confirm this is within the trachea and not in the esophagus.  Asymmetric pulmonary edema most notable perihilar region greater on the right has improved slightly since the prior exam.  Question pulmonary edema versus infectious infiltrate.  Original Report Authenticated By: Fuller Canada, M.D.   Dg Chest Portable 1 View  08/25/2011  *RADIOLOGY REPORT*  Clinical Data: Check endotracheal tube, shortness of breath  PORTABLE CHEST - 1 VIEW  Comparison: 08/24/2011  Findings: Endotracheal tube at the thoracic inlet, terminating 7 cm above the carina.  Enteric tube looped in the stomach.  Right subclavian venous catheter terminating at the cavoatrial junction.  Patchy bilateral airspace opacities, possibly reflecting edema and/or infection.  No pleural effusion or pneumothorax.  The heart is normal in size.  IMPRESSION: Endotracheal tube  terminates 10 cm above the carina.  Stable support apparatus, as above.  Patchy bilateral airspace opacities, possibly reflecting edema and/or infection.  Original Report Authenticated By: Charline Bills, M.D.   Dg Chest Portable 1 View  08/24/2011  *RADIOLOGY REPORT*  Clinical Data: Respiratory distress  PORTABLE CHEST - 1 VIEW  Comparison: 08/24/2011 8:50 hours  Findings: Under aerated lungs.  Diffuse bilateral airspace disease is worse.  Tubular devices are unchanged.  No pneumothorax.  Normal heart size.  IMPRESSION: Worsening diffuse bilateral airspace disease.  Original Report Authenticated By: Donavan Burnet, M.D.   Dg Chest Portable 1 View  08/24/2011  *RADIOLOGY REPORT*  Clinical Data: Evaluate endotracheal tube placement  PORTABLE CHEST - 1 VIEW  Comparison: 08/23/2011; 09/15/2006  Findings:  Grossly unchanged  cardiac silhouette and mediastinal contours. Endotracheal tube overlies tracheal air column with tip superior to the carina.  Enteric tube terminates into the left hemidiaphragm. Unchanged positioning of a right subclavian approach central venous catheter with tip overlying the mid SVC.  Improved aeration of the lungs with persistent perihilar heterogeneous opacities.  Mild cephalization of flow without frank evidence of pulmonary edema. No definite pneumothorax or pleural effusion.  Grossly unchanged bones.  IMPRESSION: 1.  Appropriately positioned support apparatus as above.  No definite pneumothorax. 2.  Improved aeration of the lungs with persistent perihilar opacities, possibly atelectasis though infection is not excluded.  Original Report Authenticated By: Waynard Reeds, M.D.   Dg Chest Portable 1 View  08/23/2011  *RADIOLOGY REPORT*  Clinical Data: Respiratory difficulty  PORTABLE CHEST - 1 VIEW  Comparison: 09/15/2006  Findings: Low lung volumes.  Extensive bilateral diffuse airspace disease  with a central distribution.  Endotracheal tube placed. Tip is 4.2 cm from carina.  Right subclavian central venous catheter tip at the cavoatrial junction.  No pneumothorax.  IMPRESSION: Endotracheal tube.  Right subclavian central venous catheter tip at the cavoatrial junction and no pneumothorax per  Bilateral diffuse airspace disease.  Original Report Authenticated By: Donavan Burnet, M.D.      ASSESSMENT: Cardiac arrest due to prolonged QT interval.  Respiratory failure.  PLAN:  Supportive care.  Edema by chest xray improving.  Weaning vent today with possible extubation per nurse.  Repeat ECG.  If QT remains prolonged as it was yesterday, will consult EP, as he has been off offending medicines for over 48 hours.  ? Whether he would qualify for AICD as he has no CAD by cath.  Continue diuresis as needed.    Anjalee Cope S.  08/26/2011  9:18 AM

## 2011-08-27 ENCOUNTER — Inpatient Hospital Stay (HOSPITAL_COMMUNITY): Payer: Non-veteran care

## 2011-08-27 DIAGNOSIS — E870 Hyperosmolality and hypernatremia: Secondary | ICD-10-CM | POA: Diagnosis not present

## 2011-08-27 DIAGNOSIS — G40309 Generalized idiopathic epilepsy and epileptic syndromes, not intractable, without status epilepticus: Secondary | ICD-10-CM

## 2011-08-27 DIAGNOSIS — G9340 Encephalopathy, unspecified: Secondary | ICD-10-CM

## 2011-08-27 LAB — CBC
HCT: 39.9 % (ref 39.0–52.0)
Hemoglobin: 12.9 g/dL — ABNORMAL LOW (ref 13.0–17.0)
MCHC: 32.3 g/dL (ref 30.0–36.0)
MCV: 91.5 fL (ref 78.0–100.0)

## 2011-08-27 LAB — POCT I-STAT 3, ART BLOOD GAS (G3+)
Acid-Base Excess: 8 mmol/L — ABNORMAL HIGH (ref 0.0–2.0)
Bicarbonate: 34.6 mEq/L — ABNORMAL HIGH (ref 20.0–24.0)
Patient temperature: 98.1
TCO2: 36 mmol/L (ref 0–100)

## 2011-08-27 LAB — COMPREHENSIVE METABOLIC PANEL
ALT: 32 U/L (ref 0–53)
AST: 31 U/L (ref 0–37)
Albumin: 2.7 g/dL — ABNORMAL LOW (ref 3.5–5.2)
Alkaline Phosphatase: 83 U/L (ref 39–117)
Calcium: 9.5 mg/dL (ref 8.4–10.5)
GFR calc Af Amer: >90 mL/min (ref >90)
Glucose, Bld: 158 mg/dL — ABNORMAL HIGH (ref 70–99)
Potassium: 3 mEq/L — ABNORMAL LOW (ref 3.5–5.1)
Sodium: 147 mEq/L — ABNORMAL HIGH (ref 135–145)
Total Protein: 6.4 g/dL (ref 6.0–8.3)

## 2011-08-27 LAB — GLUCOSE, CAPILLARY
Glucose-Capillary: 162 mg/dL — ABNORMAL HIGH (ref 70–99)
Glucose-Capillary: 127 mg/dL — ABNORMAL HIGH (ref 70–99)

## 2011-08-27 LAB — DIFFERENTIAL
Basophils Relative: 0 % (ref 0–1)
Eosinophils Relative: 1 % (ref 0–5)
Lymphocytes Relative: 15 % (ref 12–46)
Monocytes Absolute: 0.8 10*3/uL (ref 0.1–1.0)
Monocytes Relative: 14 % — ABNORMAL HIGH (ref 3–12)
Neutro Abs: 4.1 10*3/uL (ref 1.7–7.7)

## 2011-08-27 MED ORDER — SODIUM CHLORIDE 0.9 % IV SOLN
0.2000 ug/kg/h | INTRAVENOUS | Status: AC
  Administered 2011-08-27: 0.4 ug/kg/h via INTRAVENOUS
  Administered 2011-08-27 – 2011-08-28 (×2): 0.5 ug/kg/h via INTRAVENOUS
  Administered 2011-08-28: 0.4 ug/kg/h via INTRAVENOUS
  Filled 2011-08-27 (×5): qty 2

## 2011-08-27 MED ORDER — POTASSIUM CHLORIDE 10 MEQ/50ML IV SOLN
10.0000 meq | INTRAVENOUS | Status: AC
  Administered 2011-08-27 (×4): 10 meq via INTRAVENOUS
  Filled 2011-08-27 (×2): qty 100

## 2011-08-27 MED ORDER — MAGNESIUM SULFATE 40 MG/ML IJ SOLN
2.0000 g | Freq: Once | INTRAMUSCULAR | Status: AC
  Administered 2011-08-27: 2 g via INTRAVENOUS
  Filled 2011-08-27: qty 50

## 2011-08-27 MED ORDER — LORAZEPAM 2 MG/ML IJ SOLN
1.0000 mg | Freq: Once | INTRAMUSCULAR | Status: AC
  Administered 2011-08-27: 1 mg via INTRAVENOUS

## 2011-08-27 MED ORDER — SODIUM CHLORIDE 0.45 % IV SOLN
INTRAVENOUS | Status: DC
  Administered 2011-08-27: 20 mL/h via INTRAVENOUS
  Administered 2011-08-27: 10 mL/h via INTRAVENOUS
  Administered 2011-08-29 (×2): via INTRAVENOUS

## 2011-08-27 NOTE — Plan of Care (Signed)
Problem: Phase II Progression Outcomes Goal: Time pt extubated/weaned off vent Outcome: Progressing 1125am 08/27/2011 Goal: Tolerating prescribed nutrition plan Outcome: Not Progressing Pt. Extubated, OG discontinued. Will discuss with primary team about bedside swallow/

## 2011-08-27 NOTE — Consult Note (Signed)
Subjective: Patient is intubated and sedated. He cannot provide history. I am told that he was a bit agitated and his propofol was turned up this am.   Objective: Vital signs in last 24 hours: Temp:  [97.4 F (36.3 C)-98.3 F (36.8 C)] 97.4 F (36.3 C) (11/10 0300) Pulse Rate:  [55-92] 72  (11/10 0900) Resp:  [8-18] 16  (11/10 0900) BP: (104-155)/(66-95) 116/77 mmHg (11/10 0900) SpO2:  [82 %-100 %] 98 % (11/10 0900) FiO2 (%):  [40 %-60 %] 50 % (11/10 0808) Weight:  [102.7 kg (226 lb 6.6 oz)] 226 lb 6.6 oz (102.7 kg) (11/10 0500)  Intake/Output from previous day: 11/09 0701 - 11/10 0700 In: 1305 [NG/GT:1005; IV Piggyback:300] Out: 4450 [Urine:4450]  PE: Intubated and sedated. Patient does have spontaneous movements of his arms, but does not open eyes or follow commands or interact with the examiner. He does with draw all 4 ext. to pain. He has PERRL. Eyes rolled back. No facial asymmetry.  Lab Results:  Basename 08/27/11 0500 08/26/11 0455 08/25/11 0250  WBC 5.8 -- 8.5  HGB 12.9* -- 13.1  HCT 39.9 -- 40.5  PLT 106* -- 102*  NA 147* 145 --  K 3.0* 4.0 --  CL 101 106 --  CO2 35* 30 --  GLUCOSE 158* 129* --  BUN 19 20 --  CREATININE 0.77 0.90 --  CALCIUM 9.5 8.9 --  LABA1C -- -- --   Studies/Results: [name] Wo Contrast  08/27/2011  *RADIOLOGY REPORT*  Clinical Data: Seizure.  The patient recently had a large dose of antihistamines.  MRI HEAD WITHOUT AND WITH CONTRAST  Technique:  Multiplanar, multiecho pulse sequences of the brain and surrounding structures were obtained according to standard protocol without and with intravenous contrast  Contrast: 20mL MULTIHANCE GADOBENATE DIMEGLUMINE 529 MG/ML IV SOLN  Comparison: CT head without contrast 08/24/2011.  Findings: No acute infarct, hemorrhage, mass lesion is present. Mild periventricular white matter signal is noted.  Three or four punctate subcortical hyperdensities are seen as well.  There is no enhancement associated  with these lesions.  There is an area of linear enhancement involving the right parietal lobe, compatible with a benign venous angioma.  No other pathologic enhancement is evident.  Flow is present in the major intracranial arteries.  The globes orbits are intact.  The paranasal sinuses are clear.  Fluid is present in the mastoid air cells bilaterally.  No obstructing nasopharyngeal lesion is evident.  Dedicated imaging of the hippocampal structures demonstrates symmetric size and signal.  IMPRESSION:  1.  No acute or focal abnormality to explain seizures. 2.  Minimal white matter changes slightly advanced for age. The finding is nonspecific but can be seen in the setting of chronic microvascular ischemia, a demyelinating process such as multiple sclerosis, vasculitis, complicated migraine headaches, or as the sequelae of a prior infectious or inflammatory process. 3.  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 4.  Bilateral mastoid effusions are likely related to the patient's intubated status.  Original Report Authenticated By: Jamesetta Orleans. MATTERN, M.D.   Dg Chest Portable 1 View  08/27/2011  *RADIOLOGY REPORT*  Clinical Data: Evaluate ET tube placement  PORTABLE CHEST - 1 VIEW  Comparison: 08/26/2011  Findings: There is a right subclavian catheter with tip in the SVC.  The heart size is mildly enlarged.  No pleural effusions identified.  Bilateral lower lobe predominant airspace opacities have improved from previous exam.  IMPRESSION:  1.  Interval  improvement in aeration to the lungs compared with previous exam.  Original Report Authenticated By: Rosealee Albee, M.D.   Dg Chest Portable 1 View  08/26/2011  *RADIOLOGY REPORT*  Clinical Data: Check endotracheal tube after placement.  PORTABLE CHEST - 1 VIEW  Comparison: 08/25/2011.  Findings: Endotracheal tube tip 11 cm above the carina at the lower neck/thoracic inlet junction.  Correlation with breath sounds recommended to  confirm this is within the trachea and not in the esophagus.  Nasogastric tube curled within the stomach.  The tip is not included present exam.  Right central line tip projects over the mid to distal superior vena cava level.  No gross pneumothorax.  Asymmetric pulmonary edema most notable perihilar region greater on the right has improved slightly since the prior exam.  Question pulmonary edema versus infectious infiltrate.  IMPRESSION: Endotracheal tube tip 11 cm above the carina at the lower neck/thoracic inlet junction.  Correlation with breath sounds recommended to confirm this is within the trachea and not in the esophagus.  Asymmetric pulmonary edema most notable perihilar region greater on the right has improved slightly since the prior exam.  Question pulmonary edema versus infectious infiltrate.  Original Report Authenticated By: Fuller Canada, M.D.   Medications: I have reviewed the patient's current medications.  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 EEG showed burst supression pattern, which may be consistent with anoxic brain injury, heavy sedation or a combination of the two Given relatively normal MRI brain, this finding may be primarily due to propofol. I would keep on propofol till Monday and stop it in am and get an EEG Monday morning. Corrected Dilantin level should range from 18 to 22. Will follow on Monday. Call with questions if there any events on Sunday.   Jassen Sarver

## 2011-08-27 NOTE — Progress Notes (Signed)
MEDICATION RELATED CONSULT NOTE - INITIAL   Pharmacy Consult for Phenytion  Indication: New Seizures   No Known Allergies  Patient Measurements: Height: 5\' 11"  (180.3 cm) Weight: 226 lb 6.6 oz (102.7 kg) IBW/kg (Calculated) : 75.3  Adjusted Body Weight:  88 kg  Vital Signs: Temp: 97.4 F (36.3 C) (11/10 0300) Temp src: Oral (11/10 0300) BP: 112/79 mmHg (11/10 0700) Pulse Rate: 69  (11/10 0700) Intake/Output from previous day: 11/09 0701 - 11/10 0700 In: 1305 [NG/GT:1005; IV Piggyback:300] Out: 4400 [Urine:4400] Intake/Output from this shift:    Labs:  Basename 08/27/11 0500 08/26/11 0455 08/25/11 0250 08/24/11 1400  WBC 5.8 -- 8.5 9.3  HGB 12.9* -- 13.1 12.9*  HCT 39.9 -- 40.5 39.3  PLT 106* -- 102* 107*  APTT -- -- -- --  CREATININE 0.77 0.90 1.04 --  LABCREA -- -- -- --  CREATININE 0.77 0.90 1.04 --  CREAT24HRUR -- -- -- --  MG -- -- 1.8 --  PHOS -- -- -- --  ALBUMIN 2.7* 2.6* 2.7* --  PROT 6.4 6.4 6.2 --  ALBUMIN 2.7* 2.6* 2.7* --  AST 31 38* 66* --  ALT 32 36 50 --  ALKPHOS 83 88 89 --  BILITOT 0.4 0.5 0.6 --  BILIDIR -- -- -- --  IBILI -- -- -- --   Estimated Creatinine Clearance: 145.3 ml/min (by C-G formula based on Cr of 0.77).   Microbiology: Recent Results (from the past 720 hour(s))  MRSA PCR SCREENING     Status: Normal   Collection Time   08/25/11 11:31 AM      Component Value Range Status Comment   MRSA by PCR NEGATIVE  NEGATIVE  Final     Assessment: Patient admitted, intubated and sedated on propofol and prn ativan after witnessed seizure and then cardiac arrest.  He took excessive amounts of diphenhydramine for rash.  He continues to have Sz activity and was started of IV phenytoin load 1500mg  yesterday and 100mg  IV q8.  His albumin 2.7  His phenyotin level is 11.7 corrected for low albumin =  18.8 which is at goal 10-20.   Keep in mind this is post load and not a steady state level and should be repeated next week.  RN reports pt had  quite night.   I/O has been neg about 8L in last 3 days K+ low - replace? DVT px Heparin sq GIP famotidine 20mg  q12 [place]  Goal of Therapy:  Phenytoin level 10-20  Plan:  Continue Phenytoin 100mg  iv q8  Marcelino Scot 08/27/2011,7:53 AM

## 2011-08-27 NOTE — Plan of Care (Signed)
Problem: Phase II Progression Outcomes Goal: Mental status at or near baseline Outcome: Not Progressing Pt. Unable to follow commands.   Problem: Phase II Progression Outcomes Goal: Seizure activity resolved/baseline Outcome: Progressing Pt. Had no seizure activity.

## 2011-08-27 NOTE — Progress Notes (Signed)
SUBJECTIVE:  Planned extubation yesterday.  Apparently, patient developed seizures while sedation was being decreased.  No arrhythmias at that time.    OBJECTIVE:   Vitals:   Filed Vitals:   08/27/11 0400 08/27/11 0500 08/27/11 0600 08/27/11 0700  BP: 113/84 114/85 114/78 112/79  Pulse: 66 67 66 69  Temp:      TempSrc:      Resp: 16 16 16 16   Height:      Weight: 102.7 kg (226 lb 6.6 oz) 102.7 kg (226 lb 6.6 oz)    SpO2: 100% 100% 100% 100%   I&O's:   Intake/Output Summary (Last 24 hours) at 08/27/11 0800 Last data filed at 08/27/11 0700  Gross per 24 hour  Intake   1270 ml  Output   4450 ml  Net  -3180 ml   TELEMETRY: Reviewed telemetry pt in NSR:     PHYSICAL EXAM General: Well developed, well nourished, in no acute distress Head: Eyes PERRLA, No xanthomas.   Normal cephalic and atramatic  Lungs:  Coarse BS bilaterally Heart:   HRRR S1 S2 Pulses are 2+ & equal.            No carotid bruit. No JVD.  Abdomen: , abdomen soft and non-tender Msk:  Back normal, normal gait. Normal strength and tone for age. Extremities:   No clubbing, cyanosis or edema.   Neuro: sedated Psych:  sedated   LABS: Basic Metabolic Panel:  Basename 08/27/11 0500 08/26/11 0455 08/25/11 0250  NA 147* 145 --  K 3.0* 4.0 --  CL 101 106 --  CO2 35* 30 --  GLUCOSE 158* 129* --  BUN 19 20 --  CREATININE 0.77 0.90 --  CALCIUM 9.5 8.9 --  MG -- -- 1.8  PHOS -- -- --   Liver Function Tests:  Basename 08/27/11 0500 08/26/11 0455  AST 31 38*  ALT 32 36  ALKPHOS 83 88  BILITOT 0.4 0.5  PROT 6.4 6.4  ALBUMIN 2.7* 2.6*   No results found for this basename: LIPASE:2,AMYLASE:2 in the last 72 hours CBC:  Basename 08/27/11 0500 08/25/11 0250  WBC 5.8 8.5  NEUTROABS 4.1 7.6  HGB 12.9* 13.1  HCT 39.9 40.5  MCV 91.5 90.6  PLT 106* 102*   Cardiac Enzymes:  Basename 08/24/11 0901  CKTOTAL 111  CKMB 4.5*  CKMBINDEX --  TROPONINI 0.91*   BNP: No results found for this basename:  POCBNP:3 in the last 72 hours D-Dimer: No results found for this basename: DDIMER:2 in the last 72 hours Hemoglobin A1C: No results found for this basename: HGBA1C in the last 72 hours Fasting Lipid Panel: No results found for this basename: CHOL,HDL,LDLCALC,TRIG,CHOLHDL,LDLDIRECT in the last 72 hours Thyroid Function Tests: No results found for this basename: TSH,T4TOTAL,FREET3,T3FREE,THYROIDAB in the last 72 hours Anemia Panel: No results found for this basename: VITAMINB12,FOLATE,FERRITIN,TIBC,IRON,RETICCTPCT in the last 72 hours Coag Panel:   Lab Results  Component Value Date   INR 0.99 08/24/2011   INR 0.99 08/23/2011    RADIOLOGY: Ct Head Wo Contrast  08/24/2011  *RADIOLOGY REPORT*  Clinical Data: Seizure.  Cardiac arrest.  Evaluate for infarct.  CT HEAD WITHOUT CONTRAST  Technique:  Contiguous axial images were obtained from the base of the skull through the vertex without contrast.  Comparison: None.  Findings: There is no evidence for acute hemorrhage, hydrocephalus, mass lesion, or abnormal extra-axial fluid collection.  No definite CT evidence for acute infarction.  The visualized paranasal sinuses and mastoid air cells are clear.  IMPRESSION: Normal exam.  Original Report Authenticated By: ERIC A. MANSELL, M.D.   Dg Chest Portable 1 View  08/27/2011  *RADIOLOGY REPORT*  Clinical Data: Evaluate ET tube placement  PORTABLE CHEST - 1 VIEW  Comparison: 08/26/2011  Findings: There is a right subclavian catheter with tip in the SVC.  The heart size is mildly enlarged.  No pleural effusions identified.  Bilateral lower lobe predominant airspace opacities have improved from previous exam.  IMPRESSION:  1.  Interval improvement in aeration to the lungs compared with previous exam.  Original Report Authenticated By: Rosealee Albee, M.D.   Dg Chest Portable 1 View  08/26/2011  *RADIOLOGY REPORT*  Clinical Data: Check endotracheal tube after placement.  PORTABLE CHEST - 1 VIEW  Comparison:  08/25/2011.  Findings: Endotracheal tube tip 11 cm above the carina at the lower neck/thoracic inlet junction.  Correlation with breath sounds recommended to confirm this is within the trachea and not in the esophagus.  Nasogastric tube curled within the stomach.  The tip is not included present exam.  Right central line tip projects over the mid to distal superior vena cava level.  No gross pneumothorax.  Asymmetric pulmonary edema most notable perihilar region greater on the right has improved slightly since the prior exam.  Question pulmonary edema versus infectious infiltrate.  IMPRESSION: Endotracheal tube tip 11 cm above the carina at the lower neck/thoracic inlet junction.  Correlation with breath sounds recommended to confirm this is within the trachea and not in the esophagus.  Asymmetric pulmonary edema most notable perihilar region greater on the right has improved slightly since the prior exam.  Question pulmonary edema versus infectious infiltrate.  Original Report Authenticated By: Fuller Canada, M.D.   Dg Chest Portable 1 View  08/25/2011  *RADIOLOGY REPORT*  Clinical Data: Check endotracheal tube, shortness of breath  PORTABLE CHEST - 1 VIEW  Comparison: 08/24/2011  Findings: Endotracheal tube at the thoracic inlet, terminating 7 cm above the carina.  Enteric tube looped in the stomach.  Right subclavian venous catheter terminating at the cavoatrial junction.  Patchy bilateral airspace opacities, possibly reflecting edema and/or infection.  No pleural effusion or pneumothorax.  The heart is normal in size.  IMPRESSION: Endotracheal tube  terminates 10 cm above the carina.  Stable support apparatus, as above.  Patchy bilateral airspace opacities, possibly reflecting edema and/or infection.  Original Report Authenticated By: Charline Bills, M.D.   Dg Chest Portable 1 View  08/24/2011  *RADIOLOGY REPORT*  Clinical Data: Respiratory distress  PORTABLE CHEST - 1 VIEW  Comparison: 08/24/2011 8:50  hours  Findings: Under aerated lungs.  Diffuse bilateral airspace disease is worse.  Tubular devices are unchanged.  No pneumothorax.  Normal heart size.  IMPRESSION: Worsening diffuse bilateral airspace disease.  Original Report Authenticated By: Donavan Burnet, M.D.   Dg Chest Portable 1 View  08/24/2011  *RADIOLOGY REPORT*  Clinical Data: Evaluate endotracheal tube placement  PORTABLE CHEST - 1 VIEW  Comparison: 08/23/2011; 09/15/2006  Findings:  Grossly unchanged cardiac silhouette and mediastinal contours. Endotracheal tube overlies tracheal air column with tip superior to the carina.  Enteric tube terminates into the left hemidiaphragm. Unchanged positioning of a right subclavian approach central venous catheter with tip overlying the mid SVC.  Improved aeration of the lungs with persistent perihilar heterogeneous opacities.  Mild cephalization of flow without frank evidence of pulmonary edema. No definite pneumothorax or pleural effusion.  Grossly unchanged bones.  IMPRESSION: 1.  Appropriately positioned support apparatus as above.  No definite pneumothorax. 2.  Improved aeration of the lungs with persistent perihilar opacities, possibly atelectasis though infection is not excluded.  Original Report Authenticated By: Waynard Reeds, M.D.   Dg Chest Portable 1 View  08/23/2011  *RADIOLOGY REPORT*  Clinical Data: Respiratory difficulty  PORTABLE CHEST - 1 VIEW  Comparison: 09/15/2006  Findings: Low lung volumes.  Extensive bilateral diffuse airspace disease with a central distribution.  Endotracheal tube placed. Tip is 4.2 cm from carina.  Right subclavian central venous catheter tip at the cavoatrial junction.  No pneumothorax.  IMPRESSION: Endotracheal tube.  Right subclavian central venous catheter tip at the cavoatrial junction and no pneumothorax per  Bilateral diffuse airspace disease.  Original Report Authenticated By: Donavan Burnet, M.D.      ASSESSMENT: Prolonged QT and VF arrest.   Recurrent seizures.  Hypokalemia.  Fluid overload.  PLAN:  1)Appreciate EP input.  Will follow to determine whether he will need AICD.  Will need him to wake up.  Watch for further arrhythmia while on Dilantin. 2) Replace potassium.  Keep K> 4.0.  Mg > 2.0.  Will replace potassium and Magnesium. 3) Daily ECG. 4) Diuresing well.  Received lasix until last night.   VARANASI,JAYADEEP S.  08/27/2011  8:00 AM

## 2011-08-27 NOTE — Progress Notes (Signed)
1125-Pt placed on cpap/ps 5/5. Pt pulling well over 1000 tidal volumes-RT extubated patient with NP at the bedside. Placed on [facility] for adequate oxygen saturations 92% and higher. No stridor present-patient has good adequate cough. RT will continue to monitor-Vent still at bedside on standby

## 2011-08-27 NOTE — Progress Notes (Signed)
Follow up - Critical Care Medicine Note  Patient Details:    Ross [name] is an 45 y.o. male admitted to [facility] on 11/6 after having a seizure at home. Had been taking Benadryl for a rash. Admitted to floor, seized again night of 08/23/11 in front of family, then went into cardiac arrest. He went into [place], was shocked, was transiently bradycardic, then went into V-fib and was defribrillated again. He received epinephrine x1 and atropine.   After arrival to the ICU, he was combative and tried to speak, making purposeful movements. He was sedated. QTc prolonged . Tx to [facility] 11/7 for cards evaluation.  Lines, Airways, Drains: Intubation and on ventilation 08/23/11 >>>11/10 Right IJ triple lumen catheter 08/23/11 >>> NG/OG tube 08/23/11 >>>11/10  Anti-infectives:  No antibiotic on board  Microbiology: 11/8: MRSA PCR: neg  Best Practice/Protocols:  GI:  NPO, Pepcid Analgesic:  Fentanyl Sedation:  Precedex DVT:  SQ heparin  Events: Left heart cath 1/7-8- follows cath  Normal  left main coronary artery.Normal left anterior descending artery and its branches.Normal left circumflex artery and its branches.Normal right coronary artery.Did not evaluate left ventricular systolic function. LVEDP 40 mmHg.  11/9- seziure 11/7 eeg- delta wave temporal, no focus, enceph 11/10: MRI brain: 1. No acute or focal abnormality to explain seizures.  Consults: Treatment Team:  Laurell Roof, MD  Neuro 11/9  Subjective:    Overnight Issues:  seziure today without arrythmia associated 11/9. Now w/out seizures and looks good on SBT.   Objective:  Vital signs for last 24 hours: Temp:  [97.4 F (36.3 C)-98.3 F (36.8 C)] 97.4 F (36.3 C) (11/10 0300) Pulse Rate:  [55-92] 72  (11/10 0900) Resp:  [8-18] 16  (11/10 0900) BP: (104-155)/(66-95) 116/77 mmHg (11/10 0900) SpO2:  [82 %-100 %] 98 % (11/10 0900) FiO2 (%):  [40 %-60 %] 50 % (11/10 0808) Weight:  [102.7 kg (226  lb 6.6 oz)] 226 lb 6.6 oz (102.7 kg) (11/10 0500)  Hemodynamic parameters for last 24 hours: CVP:  [8 mmHg] 8 mmHg  Intake/Output from previous day: 11/09 0701 - 11/10 0700 In: 1305 [NG/GT:1005; IV Piggyback:300] Out: 4450 [Urine:4450]   Intake/Output this shift:    Vent settings for last 24 hours: Vent Mode:  [-] PRVC FiO2 (%):  [40 %-60 %] 50 % Set Rate:  [16 bmp] 16 bmp Vt Set:  [500 mL] 500 mL PEEP:  [5 cmH20] 5 cmH20 Plateau Pressure:  [11 cmH20-20 cmH20] 13 cmH20  Physical Exam:  General:  Arousable and responsive to tapping, Currently heavily sedated HEENT:  NCAT, no JVD, no LAD, some saliva discharge while examinating LUNGS:  Coarse, no wheeze HEART:  RRR, no murmurs/rubs/gallps ABD:  (+)BS, soft, nontender, nondistended, no guarding EXTR:  Intact, no cyanosis, no rashes, no edema SKIN:  Rash on necklace line is improving and most of the right side has now dry and slough off.  Calamine lotion on rashes noted.  Lab Results  Component Value Date   CREATININE 0.77 08/27/2011   BUN 19 08/27/2011   NA 147* 08/27/2011   K 3.0* 08/27/2011   CL 101 08/27/2011   CO2 35* 08/27/2011   Lab Results  Component Value Date   WBC 5.8 08/27/2011   HGB 12.9* 08/27/2011   HCT 39.9 08/27/2011   MCV 91.5 08/27/2011   PLT 106* 08/27/2011   Radiology PCXR: improved aeration compared with prior film. Assessment/Plan:   1) S/P Cardiac arrest. Status post prolonged QT  mediated ventricular fibrillation arrest. Felt to be secondary to unintentional Benadryl overdose. Cardiac catheterization demonstrates clean coronary arteries.  Plan: -Continued telemetry monitoring -Keep euvolemic -Avoid QTC prolonging agents, will need to keep particular close eye while on Dilantin  2) Respiratory failure, status post cardiac arrest, complicated by postictal state. Chest x-ray on November 11 is clear. Currently heavily sedated. Passed SBT.  Plan: -Extubate -wean FIO2 for sats > 94% -d/c  sedating gtts.  -followup a.m. Chest x-ray  3) Seizures. His last seizure was on November 9. He is now loaded with Dilantin, and maintained on a propofol drip. He is unresponsive currently. His MRI was negative. His EEG demonstrated burst suppression, unclear if this is due to his propofol  Infusion, or an anoxic injury. His MRI would argue against an anoxic injury. Plan: -Wakeup assessment, if he follows commands would consider extubation -Continue Dilantin - STAFF NOTE: START PRECEDEX -EEG planned again for Monday  4)  Papular rash, generalized. This is slowly improving Plan: -Continue Solu-Medrol, will taper slowly.  5) HTN (hypertension), malignant. Currently controlled on clonidine. Plan: -No change in current regimen  6) Hyperglycemia Glucose slowly creeping up with nutritional support CBG (last 3)   Basename 08/27/11 0741 08/27/11 0333 08/26/11 2353  GLUCAP 162* 147* 148*  plan -continue sliding scale insulin, decrease steroids. May need to add Lantus.   7) thrombocytopenia, currently stable  Lab 08/27/11 0500 08/25/11 0250 08/24/11 1400  PLT 106* 102* 107*  ] Plan: -watch while on Dilantin, followup CBC in a.m.  8) anemia  Lab 08/27/11 0500 08/25/11 0250 08/24/11 1400  HGB 12.9* 13.1 12.9*  ]No current evidence of bleeding. Plan: -Continue subcutaneous heparin -Transfuse only for hemoglobin less than 7, or hemodynamic instability.  9) hypernatremia in setting of diuresis.    Lab 08/27/11 0500 08/26/11 0455 08/25/11 0250  NA 147* 145 139  ] Plan: -hold further diuresis/keep euvolemic.  -f/u chemistry in am .   10) hyperkalemia Plan: -replace and recheck  Additional comments:  Critical Care Total Time*: 30 Minutes  BABCOCK,PETE 08/27/2011  STAFF NOTE: I Dr Marchelle Gearing personally evaluated patient. Elicited key findings of the fact patient is now extubated. Awake but confused and agitated RASs +2. Had seizures without arrythmia and neuro following.  Will start precedex. Rest per NP as outlined above. He remains critical  35 minutes critical care

## 2011-08-28 ENCOUNTER — Other Ambulatory Visit: Payer: Self-pay | Admitting: Cardiology

## 2011-08-28 ENCOUNTER — Inpatient Hospital Stay (HOSPITAL_COMMUNITY): Payer: Non-veteran care

## 2011-08-28 DIAGNOSIS — G9340 Encephalopathy, unspecified: Secondary | ICD-10-CM

## 2011-08-28 DIAGNOSIS — J9600 Acute respiratory failure, unspecified whether with hypoxia or hypercapnia: Secondary | ICD-10-CM

## 2011-08-28 DIAGNOSIS — I469 Cardiac arrest, cause unspecified: Secondary | ICD-10-CM

## 2011-08-28 DIAGNOSIS — T443X1A Poisoning by other parasympatholytics [anticholinergics and antimuscarinics] and spasmolytics, accidental (unintentional), initial encounter: Secondary | ICD-10-CM

## 2011-08-28 LAB — CBC
Platelets: 115 10*3/uL — ABNORMAL LOW (ref 150–400)
RBC: 4.66 MIL/uL (ref 4.22–5.81)
WBC: 7.2 10*3/uL (ref 4.0–10.5)

## 2011-08-28 LAB — BASIC METABOLIC PANEL
CO2: 28 mEq/L (ref 19–32)
Chloride: 106 mEq/L (ref 96–112)
Potassium: 3.2 mEq/L — ABNORMAL LOW (ref 3.5–5.1)
Sodium: 144 mEq/L (ref 135–145)

## 2011-08-28 LAB — BLOOD GAS, ARTERIAL
Drawn by: 23604
O2 Content: 4 L/min
O2 Saturation: 91.1 %
pCO2 arterial: 34.3 mmHg — ABNORMAL LOW (ref 35.0–45.0)
pO2, Arterial: 57.8 mmHg — ABNORMAL LOW (ref 80.0–100.0)

## 2011-08-28 LAB — GLUCOSE, CAPILLARY
Glucose-Capillary: 102 mg/dL — ABNORMAL HIGH (ref 70–99)
Glucose-Capillary: 103 mg/dL — ABNORMAL HIGH (ref 70–99)
Glucose-Capillary: 113 mg/dL — ABNORMAL HIGH (ref 70–99)
Glucose-Capillary: 122 mg/dL — ABNORMAL HIGH (ref 70–99)
Glucose-Capillary: 129 mg/dL — ABNORMAL HIGH (ref 70–99)

## 2011-08-28 MED ORDER — POTASSIUM CHLORIDE 10 MEQ/50ML IV SOLN
INTRAVENOUS | Status: AC
  Filled 2011-08-28: qty 100

## 2011-08-28 MED ORDER — POTASSIUM CHLORIDE 10 MEQ/100ML IV SOLN
10.0000 meq | INTRAVENOUS | Status: AC
  Administered 2011-08-28 (×4): 10 meq via INTRAVENOUS

## 2011-08-28 MED ORDER — SODIUM CHLORIDE 0.9 % IV SOLN
0.4000 ug/kg/h | INTRAVENOUS | Status: AC
  Administered 2011-08-28: 0.5 ug/kg/h via INTRAVENOUS
  Filled 2011-08-28: qty 4

## 2011-08-28 MED ORDER — HYDRALAZINE HCL 20 MG/ML IJ SOLN
20.0000 mg | INTRAMUSCULAR | Status: DC | PRN
  Administered 2011-08-29: 20 mg via INTRAVENOUS
  Filled 2011-08-28 (×2): qty 1

## 2011-08-28 NOTE — Progress Notes (Signed)
Subjective:  Ross [name] is an 45 y.o. male admitted to [facility] on 11/6 after having a seizure at home. Had been taking Benadryl for a rash. Admitted to floor, seized again night of 08/23/11 in front of family, then went into cardiac arrest. He went into [place], was shocked, was transiently bradycardic, then went into V-fib and was defribrillated again. He received epinephrine x1 and atropine, amiodarone. After arrival to the ICU, he was combative and tried to speak, making purposeful movements. He was sedated. QTc markedly prolonged . Tx to [facility] 11/7 for cards evaluation. Initial encounter by Dr. Eldridge Dace. Dr. Johney Frame of EP seen also.   Currently opens eyes and moves head to tactile stimulation. No verbal communication. On sedation IV now. However prior, non coherent. Would reach out for things.    Objective:  Vital Signs in the last 24 hours: Temp:  [97.6 F (36.4 C)-98.4 F (36.9 C)] 98.4 F (36.9 C) (11/11 0800) Pulse Rate:  [73-101] 86  (11/11 0600) Resp:  [12-27] 23  (11/11 0600) BP: (109-165)/(78-109) 153/98 mmHg (11/11 0600) SpO2:  [90 %-99 %] 94 % (11/11 0600) Weight:  [101.5 kg (223 lb 12.3 oz)] 223 lb 12.3 oz (101.5 kg) (11/11 0500)  Intake/Output from previous day: 11/10 0701 - 11/11 0700 In: 475 [I.V.:120; NG/GT:105; IV Piggyback:250] Out: 2100 [Urine:2100]   Physical Exam: General: Well developed, well nourished, obtunded. Head:  Normocephalic and atraumatic.Neck with dry skin/rask Lungs: Clear to auscultation and percussion. Heart: Normal S1 and S2.  No murmur, rubs or gallops.  Pulses: Pulses normal in all 4 extremities. Extremities: No clubbing or cyanosis. No edema. Neurologic: Obtunded. MAE x 4   Lab Results:  Basename 08/28/11 0650 08/27/11 0500  WBC 7.2 5.8  HGB 14.1 12.9*  PLT 115* 106*    Basename 08/28/11 0650 08/27/11 0500  NA 144 147*  K 3.2* 3.0*  CL 106 101  CO2 28 35*  GLUCOSE 97 158*  BUN 15 19  CREATININE 0.82 0.77   No results  found for this basename: TROPONINI:2,CK,MB:2 in the last 72 hours Hepatic Function Panel  Basename 08/27/11 0500  PROT 6.4  ALBUMIN 2.7*  AST 31  ALT 32  ALKPHOS 83  BILITOT 0.4  BILIDIR --  IBILI --    Imaging: MRI - no acute changes.   EKG:  This am QTc . SR. Tele no further VT. Mildly biphasic T wave in V3. Improved.    Cardiac Studies:  No CAD on cath  Assessment/Plan:  Principal Problem:  *Cardiac arrest - in the setting of prolonged QT, medication overdose, benadryl/ hydroxyzine overdose and anti-cholinergic toxicity. He has had profound QT prolongation with pause dependant torsades subsequently requiring defibrillation. Currently improved from cardiac perspective. No further VT. QT still prolonged but mildly improved. ECG's reviewed. Qtc 560 to .   Reviewed Dr. Jenel Lucks note with EP. He will reassess soon to determine further needs, possible ICD.   Active Problems:  Seizure  Poisoning by anticholinergic drug  Papular rash, generalized   HTN (hypertension), malignant - now 150's SBP. I would recommend ACE-I for BP control. Will defer to primary team.    Hyperglycemia  Thrombocytopenia  QT prolongation  Respiratory failure  Hypernatremia   Hypokalemia - K 3.2, this is being aggressively repleted by primary team. Keep K > 4.0, Mg > 2    Mesha Schamberger 08/28/2011, 9:00 AM

## 2011-08-28 NOTE — Progress Notes (Signed)
Follow up - Critical Care Medicine Note  Patient Details:    Ross [name] is an 45 y.o. male admitted to [facility] on 11/6 after having a seizure at home. Had been taking Benadryl for a rash. Admitted to floor, seized again night of 08/23/11 in front of family, then went into cardiac arrest. He went into [place], was shocked, was transiently bradycardic, then went into V-fib and was defribrillated again. He received epinephrine x1 and atropine.   After arrival to the ICU, he was combative and tried to speak, making purposeful movements. He was sedated. QTc prolonged . Tx to [facility] 11/7 for cards evaluation.  Lines, Airways, Drains: Intubation and on ventilation 08/23/11 >>>11/10 Right IJ triple lumen catheter 08/23/11 >>> NG/OG tube 08/23/11 >>>11/10  Anti-infectives:  No antibiotic on board  Microbiology: 11/8: MRSA PCR: neg 11/12: procalcitonin>>>   Best Practice/Protocols:  GI:  NPO, Pepcid Analgesic:  Fentanyl Sedation:  Precedex 11/10>>> DVT:  SQ heparin  Events: Left heart cath 1/7-8- follows cath  Normal  left main coronary artery.Normal left anterior descending artery and its branches.Normal left circumflex artery and its branches.Normal right coronary artery.Did not evaluate left ventricular systolic function. LVEDP 40 mmHg.  11/9- seziure 11/7 eeg- delta wave temporal, no focus, enceph 11/10: MRI brain: 1. No acute or focal abnormality to explain seizures.  Consults: Treatment Team:  Laurell Roof, MD  Neuro 11/9  Subjective:    Overnight Issues:  Sedated on Precedex overnight, still has intermittent episodes of agitation, mixed with episodes of somnolence. Respiratory efforts are not labored, he has a good cough.  Objective:  Vital signs for last 24 hours: Temp:  [97.6 F (36.4 C)-98.3 F (36.8 C)] 97.6 F (36.4 C) (11/11 0400) Pulse Rate:  [72-101] 86  (11/11 0600) Resp:  [12-27] 23  (11/11 0600) BP: (109-165)/(77-109) 153/98 mmHg  (11/11 0600) SpO2:  [90 %-99 %] 94 % (11/11 0600) FiO2 (%):  [50 %] 50 % (11/10 0808) Weight:  [101.5 kg (223 lb 12.3 oz)] 223 lb 12.3 oz (101.5 kg) (11/11 0500)  Hemodynamic parameters for last 24 hours: CVP:  [5 mmHg-219 mmHg] 5 mmHg  Intake/Output from previous day: 11/10 0701 - 11/11 0700 In: 475 [I.V.:120; NG/GT:105; IV Piggyback:250] Out: 2100 [Urine:2100]   Intake/Output this shift:   Physical Exam:  General:  Arousable, currently sedated on Precedex. His speech is mumbled and slurred at times. He moves all extremities without focal deficits. LUNGS:  Coarse, no wheeze, respiratory efforts and unlabored HEART:  RRR, no murmurs/rubs/gallps ABD:  (+)BS, soft, nontender, nondistended, no guarding EXTR:  Intact, no cyanosis, no rashes, no edema SKIN:  Rash on necklace line is improving and most of the right side has now dry and slough off.  Calamine lotion on rashes noted.  Lab Results  Component Value Date   CREATININE 0.82 08/28/2011   BUN 15 08/28/2011   NA 144 08/28/2011   K 3.2* 08/28/2011   CL 106 08/28/2011   CO2 28 08/28/2011   Lab Results  Component Value Date   WBC 7.2 08/28/2011   HGB 14.1 08/28/2011   HCT 41.4 08/28/2011   MCV 88.8 08/28/2011   PLT 115* 08/28/2011   Radiology PCXR: chest x-ray today demonstrates consolidative changes in the left lingula. Concerning for atelectasis versus new infiltrate following extubation Assessment/Plan:   1) S/P Cardiac arrest. Status post prolonged QT mediated ventricular fibrillation arrest. Felt to be secondary to unintentional Benadryl overdose. Cardiac catheterization demonstrates clean coronary arteries.  Plan: -Continued telemetry monitoring -Keep euvolemic -Avoid QTC prolonging agents, will need to keep particular close eye while on Dilantin  2) Respiratory failure, status post cardiac arrest, complicated by postictal state. Extubated on 08/27/11. Has strong cough, work of breathing acceptable. Requiring  minimal oxygen. His chest x-ray does demonstrate new atelectasis versus infiltrate in the left upper lobe. Plan: -wean FIO2 for sats > 94% -we'll try to decrease sedating medications as tolerated, Except Precedex at this point. -followup a.m. Chest x-ray -keep him n.p.o.  3) Seizures. His last seizure was on November 9. He is now loaded with Dilantin, and maintained on a propofol drip. He is unresponsive currently. His MRI was negative. His EEG demonstrated burst suppression, unclear if this is due to his propofol  Infusion, or an anoxic injury. His MRI would argue against an anoxic injury. Is currently sedated. No seizure activity 4/24 hours. Precedex helping agitation per RN on 08/28/11 Plan: -Continue Dilantin - we will try to limit sedating meds, neurology request to discontinue Precedex in the morning tomorrow 08/29/11 noted; towards EEG -EEG planned again for Monday 08/29/11 -we will stop clonidine, and scheduled benzodiazepines  4)  Papular rash, generalized. This is slowly improving Plan: -Continue Solu-Medrol, will taper slowly.  5) HTN (hypertension), malignant. Seems to be exacerbated by agitation. Unable to take oral clonidine Plan: - Will add when necessary hydralazine  6) Hyperglycemia Glucose has normalized after tube feedings have been discontinued CBG (last 3)   Basename 08/28/11 0326 08/27/11 2343 08/27/11 2000  GLUCAP 96 87 98  plan -continue sliding scale insulin, decrease steroids.    7) thrombocytopenia, currently stable  Lab 08/28/11 0650 08/27/11 0500 08/25/11 0250  PLT 115* 106* 102*  ] Plan: -watch while on Dilantin, followup CBC in a.m.  8) anemia  Lab 08/28/11 0650 08/27/11 0500 08/25/11 0250  HGB 14.1 12.9* 13.1  ]No current evidence of bleeding. Plan: -Continue subcutaneous heparin -Transfuse only for hemoglobin less than 7, or hemodynamic instability.  9) hypernatremia in setting of diuresis. . This is better after discontinuing  continuing diuresis   Lab 08/28/11 0650 08/27/11 0500 08/26/11 0455  NA 144 147* 145  ] Plan: -keep euvolemic.  -f/u chemistry in am .   10) hypokalemia Plan: -replace and recheck  Additional comments:  Critical Care Total Time*: 30 Minutes  BABCOCK,PETE 08/28/2011  STAFF NOTE:I, Dr Lavinia Sharps have personally reviewed patient's available data, including medical history, events of note, physical examination and test results as part of my evaluation. I have discussed with NP and other care providers such as RN and RRT.  In addition I personally evaluated patient and elicited key findings of  Improved encephalopathy with precedex but still agitated. DC  precedex on 08/29/11 am for EEG. Rest per NP whose note is outlined above and that I agree with

## 2011-08-28 NOTE — Consult Note (Signed)
Subjective: Patient appears to be less sedated today, but overall no change in his condition.  Objective: Vital signs in last 24 hours: Temp:  [97.6 F (36.4 C)-98.3 F (36.8 C)] 97.6 F (36.4 C) (11/11 0400) Pulse Rate:  [72-101] 86  (11/11 0600) Resp:  [12-27] 23  (11/11 0600) BP: (109-165)/(77-109) 153/98 mmHg (11/11 0600) SpO2:  [90 %-100 %] 94 % (11/11 0600) FiO2 (%):  [40 %-50 %] 50 % (11/10 0808) Weight:  [101.5 kg (223 lb 12.3 oz)] 223 lb 12.3 oz (101.5 kg) (11/11 0500)  Intake/Output from previous day: 11/10 0701 - 11/11 0700 In: 475 [I.V.:120; NG/GT:105; IV Piggyback:250] Out: 2100 [Urine:2100]  Neuro: still arousable to pain, briefly opens eyes, but this is not sustained. Eye still rolled back. No gaze deviation. No face asymmetry. Withdraws all 4 ext. to pain equally and symmetrically.    Lab Results:  Basename 08/28/11 0650 08/27/11 0500  WBC 7.2 5.8  HGB 14.1 12.9*  HCT 41.4 39.9  PLT 115* 106*  NA 144 147*  K 3.2* 3.0*  CL 106 101  CO2 28 35*  GLUCOSE 97 158*  BUN 15 19  CREATININE 0.82 0.77  CALCIUM 9.2 9.5  LABA1C -- --   Studies/Results: Mr Lodema Pilot Contrast  08/27/2011  *RADIOLOGY REPORT*  Clinical Data: Seizure.  The patient recently had a large dose of antihistamines.  MRI HEAD WITHOUT AND WITH CONTRAST  Technique:  Multiplanar, multiecho pulse sequences of the brain and surrounding structures were obtained according to standard protocol without and with intravenous contrast  Contrast: 20mL MULTIHANCE GADOBENATE DIMEGLUMINE 529 MG/ML IV SOLN  Comparison: CT head without contrast 08/24/2011.  Findings: No acute infarct, hemorrhage, mass lesion is present. Mild periventricular white matter signal is noted.  Three or four punctate subcortical hyperdensities are seen as well.  There is no enhancement associated with these lesions.  There is an area of linear enhancement involving the right parietal lobe, compatible with a benign venous angioma.  No other  pathologic enhancement is evident.  Flow is present in the major intracranial arteries.  The globes orbits are intact.  The paranasal sinuses are clear.  Fluid is present in the mastoid air cells bilaterally.  No obstructing nasopharyngeal lesion is evident.  Dedicated imaging of the hippocampal structures demonstrates symmetric size and signal.  IMPRESSION:  1.  No acute or focal abnormality to explain seizures. 2.  Minimal white matter changes slightly advanced for age. The finding is nonspecific but can be seen in the setting of chronic microvascular ischemia, a demyelinating process such as multiple sclerosis, vasculitis, complicated migraine headaches, or as the sequelae of a prior infectious or inflammatory process. 3.  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 4.  Bilateral mastoid effusions are likely related to the patient's intubated status.  Original Report Authenticated By: Jamesetta Orleans. MATTERN, M.D.   Dg Chest Portable 1 View  08/27/2011  *RADIOLOGY REPORT*  Clinical Data: Evaluate ET tube placement  PORTABLE CHEST - 1 VIEW  Comparison: 08/26/2011  Findings: There is a right subclavian catheter with tip in the SVC.  The heart size is mildly enlarged.  No pleural effusions identified.  Bilateral lower lobe predominant airspace opacities have improved from previous exam.  IMPRESSION:  1.  Interval improvement in aeration to the lungs compared with previous exam.  Original Report Authenticated By: Rosealee Albee, M.D.   Medications: I have reviewed the patient's current medications.  Assessment/Plan: 46 years old man with cardiac arrest  and possible anoxic brain injury who had been in status epilepticus and on propofol and dilantin. Today he is on Precedex. Please stop sedation tomorrow in the morning and obtain an EEG. Daily Dilantin levels with adjusted Dilantin level between 18 to 22. Will follow.   Pratyush Ammon

## 2011-08-29 ENCOUNTER — Inpatient Hospital Stay (HOSPITAL_COMMUNITY): Payer: Non-veteran care

## 2011-08-29 DIAGNOSIS — G40309 Generalized idiopathic epilepsy and epileptic syndromes, not intractable, without status epilepticus: Secondary | ICD-10-CM

## 2011-08-29 DIAGNOSIS — R569 Unspecified convulsions: Secondary | ICD-10-CM

## 2011-08-29 LAB — GLUCOSE, CAPILLARY: Glucose-Capillary: 107 mg/dL — ABNORMAL HIGH (ref 70–99)

## 2011-08-29 LAB — CBC
HCT: 41.3 % (ref 39.0–52.0)
MCH: 30.2 pg (ref 26.0–34.0)
MCHC: 34.9 g/dL (ref 30.0–36.0)
MCV: 86.9 fL (ref 78.0–100.0)
Platelets: 134 10*3/uL — ABNORMAL LOW (ref 150–400)
RBC: 4.93 MIL/uL (ref 4.22–5.81)
RDW: 14.5 % (ref 11.5–15.5)

## 2011-08-29 LAB — PROCALCITONIN: Procalcitonin: 0.37 ng/mL

## 2011-08-29 LAB — BASIC METABOLIC PANEL
CO2: 25 mEq/L (ref 19–32)
Calcium: 9.7 mg/dL (ref 8.4–10.5)
GFR calc non Af Amer: >90 mL/min (ref >90)
Potassium: 3.2 mEq/L — ABNORMAL LOW (ref 3.5–5.1)
Sodium: 139 mEq/L (ref 135–145)

## 2011-08-29 MED ORDER — METOPROLOL TARTRATE 25 MG PO TABS
25.0000 mg | ORAL_TABLET | Freq: Two times a day (BID) | ORAL | Status: DC
  Administered 2011-08-29 – 2011-09-01 (×6): 25 mg via ORAL
  Filled 2011-08-29 (×7): qty 1

## 2011-08-29 MED ORDER — POTASSIUM CHLORIDE 10 MEQ/100ML IV SOLN
INTRAVENOUS | Status: AC
  Filled 2011-08-29: qty 100

## 2011-08-29 MED ORDER — SODIUM CHLORIDE 0.9 % IJ SOLN
3.0000 mL | Freq: Two times a day (BID) | INTRAMUSCULAR | Status: DC
  Administered 2011-08-30 – 2011-09-01 (×4): 3 mL via INTRAVENOUS

## 2011-08-29 MED ORDER — POTASSIUM CHLORIDE 10 MEQ/50ML IV SOLN
INTRAVENOUS | Status: AC
  Filled 2011-08-29: qty 50

## 2011-08-29 MED ORDER — POTASSIUM CHLORIDE 10 MEQ/50ML IV SOLN
10.0000 meq | INTRAVENOUS | Status: AC
  Administered 2011-08-29 (×3): 10 meq via INTRAVENOUS
  Administered 2011-08-29: 10:00:00 via INTRAVENOUS
  Filled 2011-08-29 (×3): qty 50

## 2011-08-29 MED ORDER — POTASSIUM CHLORIDE 10 MEQ/100ML IV SOLN: 10.0000 meq | INTRAVENOUS | Status: DC

## 2011-08-29 NOTE — Progress Notes (Signed)
  Subjective:  Denies c/p or sob. Objective:  Vital Signs in the last 24 hours: Temp:  [98.9 F (37.2 C)-100.1 F (37.8 C)] 99.1 F (37.3 C) (11/12 1600) Pulse Rate:  [31-117] 114  (11/12 0700) Resp:  [15-24] 18  (11/12 1600) BP: (109-167)/(83-114) 120/89 mmHg (11/12 1400) SpO2:  [80 %-100 %] 97 % (11/12 0700) Weight:  [221 lb 1.9 oz (100.3 kg)] 221 lb 1.9 oz (100.3 kg) (11/12 0500)  Intake/Output from previous day: 11/11 0701 - 11/12 0700 In: 506.4 [I.V.:256.4; IV Piggyback:250] Out: 2011 [Urine:2010; Stool:1] Intake/Output from this shift: Total I/O In: 140 [I.V.:40; IV Piggyback:100] Out: 275 [Urine:275]  Physical Exam: Well appearing NAD HEENT: Unremarkable Neck:  No JVD, no thyromegally Lymphatics:  No adenopathy Back:  No CVA tenderness Lungs:  Clear with no wheezes. HEART:  Regular rate rhythm, no murmurs, no rubs, no clicks Abd:  Flat, positive bowel sounds, no organomegally, no rebound, no guarding Ext:  2 plus pulses, no edema, no cyanosis, no clubbing Skin:  No rashes no nodules Neuro:  CN II through XII intact, motor grossly intact  Lab Results:  Basename 08/29/11 1100 08/29/11 0400  WBC 8.4 8.4  HGB 14.4 14.9  PLT 129* 134*    Basename 08/29/11 0400 08/28/11 0650  NA 139 144  K 3.2* 3.2*  CL 101 106  CO2 25 28  GLUCOSE 106* 97  BUN 16 15  CREATININE 0.81 0.82   No results found for this basename: TROPONINI:2,CK,MB:2 in the last 72 hours Hepatic Function Panel  Basename 08/27/11 0500  PROT 6.4  ALBUMIN 2.7*  AST 31  ALT 32  ALKPHOS 83  BILITOT 0.4  BILIDIR --  IBILI --   No results found for this basename: CHOL in the last 72 hours No results found for this basename: PROTIME in the last 72 hours    Cardiac Studies: NSR  Assessment/Plan:  Principal Problem:  *Cardiac arrest - His QT prolongation is almost certainly due to hydroxyzine and benadryl taken in excess. Will follow his QT interval, start a beta blocker and observe. He  could be transferred out of ICU.   QT prolongation - Will start beta blocker and keep electrolytes repleted. As he has a reversible cause of VF arrest, I would favor not recommending an ICD at this time.    Acute encephalopathy - this appears to be improving.    LOS: 6 days    Lewayne Bunting 08/29/2011, 6:47 PM

## 2011-08-29 NOTE — Progress Notes (Addendum)
Follow up - Critical Care Medicine Note  Patient Details:    Ross [name] is an 45 y.o. male admitted to [facility] on 11/6 after having a seizure at home. Had been taking Benadryl for a rash. Admitted to floor, seized again night of 08/23/11 in front of family, then went into cardiac arrest. He went into [place], was shocked, was transiently bradycardic, then went into V-fib and was defribrillated again. He received epinephrine x1 and atropine.   After arrival to the ICU, he was combative and tried to speak, making purposeful movements. He was sedated. QTc prolonged . Tx to [facility] 11/7 for cards evaluation.  Lines, Airways, Drains: Intubation and on ventilation 08/23/11 >>>11/10 Right IJ triple lumen catheter 08/23/11 >>>  NG/OG tube 08/23/11 >>>11/10  Anti-infectives:  No antibiotic on board  Microbiology: 11/8: MRSA PCR: neg 11/12: procalcitonin>>>   Best Practice/Protocols:  GI:  NPO, Pepcid Analgesic:  Fentanyl Sedation:  Precedex 11/10>>> DVT:  SQ heparin  Events: Left heart cath 11/7  Normal  left main coronary artery.Normal left anterior descending artery and its branches.Normal left circumflex artery and its branches.Normal right coronary artery.Did not evaluate left ventricular systolic function. LVEDP 40 mmHg.  11/9- seziure 11/7 eeg- delta wave temporal, no focus, enceph 11/10: MRI brain: 1. No acute or focal abnormality to explain seizures. 11/12: repeat EEG >>   Consults: Treatment Team:  Laurell Roof, MD  Neuro 11/9  Subjective:    Overnight Issues:  RASS 0. Calm. No new complaints  Objective:  Vital signs for last 24 hours: Temp:  [98.9 F (37.2 C)] 98.9 F (37.2 C) (11/12 0000) Pulse Rate:  [31-117] 114  (11/12 0700) Resp:  [15-30] 18  (11/12 0700) BP: (109-167)/(81-114) 162/109 mmHg (11/12 0700) SpO2:  [80 %-100 %] 97 % (11/12 0700) Weight:  [100.3 kg (221 lb 1.9 oz)] 221 lb 1.9 oz (100.3 kg) (11/12 0500)   Intake/Output  from previous day: 11/11 0701 - 11/12 0700 In: 506.4 [I.V.:256.4; IV Piggyback:250] Out: 2011 [Urine:2010; Stool:1]   Physical Exam:  General: Awake, alert, calm ,+ F/C LUNGS:  Coarse, no wheeze, respiratory efforts and unlabored HEART:  RRR, no murmurs/rubs/gallps ABD:  (+)BS, soft, nontender, nondistended, no guarding EXTR:  Intact, no cyanosis, no rashes, no edema SKIN:  Rash on necklace line is improving and most of the right side has now dry and slough off.  Calamine lotion on rashes noted.  Lab Results  Component Value Date   CREATININE 0.81 08/29/2011   BUN 16 08/29/2011   NA 139 08/29/2011   K 3.2* 08/29/2011   CL 101 08/29/2011   CO2 25 08/29/2011   Lab Results  Component Value Date   WBC 8.4 08/29/2011   HGB 14.9 08/29/2011   HCT 42.8 08/29/2011   MCV 86.8 08/29/2011   PLT 134* 08/29/2011   Radiology PCXR: chest x-ray today demonstrates consolidative changes in the left lingula. Concerning for atelectasis versus new infiltrate following extubation Assessment/Plan:   1) S/P Cardiac arrest. Status post prolonged QT mediated ventricular fibrillation arrest. Felt to be secondary to unintentional Benadryl overdose. Cardiac catheterization demonstrates clean coronary arteries. Plan: -Continued telemetry monitoring -Keep euvolemic -Avoid QTC prolonging agents, will need to keep particular close eye while on Dilantin  2) Respiratory failure, status post cardiac arrest, complicated by postictal state. Resolved  3) Seizures. His last seizure was on November 9. He is now loaded with Dilantin. His MRI was negative. His EEG demonstrated burst suppression.  Plan: -Continue  Dilantin -EEG today  4)  Papular rash, generalized. Resolved Plan: -D/C Solumedrol and follow  5) HTN (hypertension), malignant. Seems to be exacerbated by agitation. Unable to take oral clonidine Plan: Controlled - Will add when necessary hydralazine  6) Hyperglycemia- Resolved. Begin diet and  D/C CBGs   7) thrombocytopenia, currently stable  Lab 08/29/11 0400 08/28/11 0650 08/27/11 0500  PLT 134* 115* 106*  ] Plan: -watch while on Dilantin, followup CBC in a.m.  8) anemia  Lab 08/29/11 0400 08/28/11 0650 08/27/11 0500  HGB 14.9 14.1 12.9*  ]No current evidence of bleeding. Plan: -Continue subcutaneous heparin -Transfuse only for hemoglobin less than 7, or hemodynamic instability.  9) hypernatremia in setting of diuresis. Resolved   Lab 08/29/11 0400 08/28/11 0650 08/27/11 0500  NA 139 144 147*  ]   10) hypokalemia Plan: -replace and recheck   Billy Fischer 08/29/2011    transfer to SDU and to [facility]. Discussed with Dr Sharon Seller. PCCM will sign off. Please call if we can be of further assistance  Billy Fischer, MD;  PCCM service; Mobile [phone]

## 2011-08-29 NOTE — Procedures (Signed)
EEG NUMBER:  [id number]  This routine EEG was requested in this 45 year old man who is status post cardiac arrest after Benadryl overdose who presented in status epilepticus.  His medications include phenytoin.  The EEG was done with the patient briefly awake but predominantly drowsy but responding to commands.  During periods of maximal wakefulness, background activities are composed generally of low amplitude beta activities that were frontally dominant.  No posterior dominant rhythm was seen.  Photic stimulation did not produce a driving response.    The vast majority of the tracing was done during drowsiness.  Drowsiness was characterized by increased amplitudes of frontally dominant beta activity with overlying symmetric delta and theta activities seen.  No signs of stage 2 sleep including sleep spindles and vertex sharp waves were seen.  CLINICAL INTERPRETATION:  This routine EEG done with the patient briefly awake, but predominantly drowsy, is normal.  No interictal epileptiform discharges, electrographic seizures, or nonconvulsive status epilepticus is seen.          ______________________________ Denton Meek, MD    [id number] D:  08/29/2011 17:14:18  T:  08/29/2011 22:09:16  Job #:  [id number]

## 2011-08-29 NOTE — Progress Notes (Signed)
SUBJECTIVE: Has a sore throat.  He says he passed out 4 times prior to this episode, but that we should check with his wife.  OBJECTIVE:   Vitals:   Filed Vitals:   08/29/11 0400 08/29/11 0500 08/29/11 0600 08/29/11 0700  BP: 163/114 167/107 151/108 162/109  Pulse: 115 117 115 114  Temp:      TempSrc:      Resp: 18 24 18 18   Height:      Weight:  100.3 kg (221 lb 1.9 oz)    SpO2: 97% 96% 94% 97%   I&O's:   Intake/Output Summary (Last 24 hours) at 08/29/11 0911 Last data filed at 08/29/11 0600  Gross per 24 hour  Intake 466.37 ml  Output   1761 ml  Net -1294.63 ml   TELEMETRY: Reviewed telemetry pt in sinus tachycardia:     PHYSICAL EXAM General: Well developed, well nourished, in no acute distress Head: Eyes PERRLA, No xanthomas.   Normal cephalic and atramatic  Lungs:   Clear bilaterally to auscultation and percussion. Heart:   tachy S1 S2 Pulses are 2+ & equal.            No carotid bruit. No JVD.  No abdominal bruits. No femoral bruits. Abdomen: Bowel sounds are positive, abdomen soft and non-tender without masses or                  Hernia's noted. . Extremities:   No clubbing, cyanosis or edema.   Neuro: Alert and oriented  Psych:  Good affect, responds appropriately   LABS: Basic Metabolic Panel:  Basename 08/29/11 0400 08/28/11 0650  NA 139 144  K 3.2* 3.2*  CL 101 106  CO2 25 28  GLUCOSE 106* 97  BUN 16 15  CREATININE 0.81 0.82  CALCIUM 9.7 9.2  MG -- --  PHOS -- --   Liver Function Tests:  Basename 08/27/11 0500  AST 31  ALT 32  ALKPHOS 83  BILITOT 0.4  PROT 6.4  ALBUMIN 2.7*   No results found for this basename: LIPASE:2,AMYLASE:2 in the last 72 hours CBC:  Basename 08/29/11 0400 08/28/11 0650 08/27/11 0500  WBC 8.4 7.2 --  NEUTROABS -- -- 4.1  HGB 14.9 14.1 --  HCT 42.8 41.4 --  MCV 86.8 88.8 --  PLT 134* 115* --   Cardiac Enzymes: No results found for this basename: CKTOTAL:3,CKMB:3,CKMBINDEX:3,TROPONINI:3 in the last 72  hours BNP: No results found for this basename: POCBNP:3 in the last 72 hours D-Dimer: No results found for this basename: DDIMER:2 in the last 72 hours Hemoglobin A1C: No results found for this basename: HGBA1C in the last 72 hours Fasting Lipid Panel: No results found for this basename: CHOL,HDL,LDLCALC,TRIG,CHOLHDL,LDLDIRECT in the last 72 hours Thyroid Function Tests: No results found for this basename: TSH,T4TOTAL,FREET3,T3FREE,THYROIDAB in the last 72 hours Anemia Panel: No results found for this basename: VITAMINB12,FOLATE,FERRITIN,TIBC,IRON,RETICCTPCT in the last 72 hours Coag Panel:   Lab Results  Component Value Date   INR 0.99 08/24/2011   INR 0.99 08/23/2011    RADIOLOGY: Ct Head Wo Contrast  08/24/2011  *RADIOLOGY REPORT*  Clinical Data: Seizure.  Cardiac arrest.  Evaluate for infarct.  CT HEAD WITHOUT CONTRAST  Technique:  Contiguous axial images were obtained from the base of the skull through the vertex without contrast.  Comparison: None.  Findings: There is no evidence for acute hemorrhage, hydrocephalus, mass lesion, or abnormal extra-axial fluid collection.  No definite CT evidence for acute infarction.  The visualized paranasal  sinuses and mastoid air cells are clear.  IMPRESSION: Normal exam.  Original Report Authenticated By: ERIC A. MANSELL, M.D.   [name] Wo Contrast  08/27/2011  *RADIOLOGY REPORT*  Clinical Data: Seizure.  The patient recently had a large dose of antihistamines.  MRI HEAD WITHOUT AND WITH CONTRAST  Technique:  Multiplanar, multiecho pulse sequences of the brain and surrounding structures were obtained according to standard protocol without and with intravenous contrast  Contrast: 20mL MULTIHANCE GADOBENATE DIMEGLUMINE 529 MG/ML IV SOLN  Comparison: CT head without contrast 08/24/2011.  Findings: No acute infarct, hemorrhage, mass lesion is present. Mild periventricular white matter signal is noted.  Three or four punctate subcortical hyperdensities  are seen as well.  There is no enhancement associated with these lesions.  There is an area of linear enhancement involving the right parietal lobe, compatible with a benign venous angioma.  No other pathologic enhancement is evident.  Flow is present in the major intracranial arteries.  The globes orbits are intact.  The paranasal sinuses are clear.  Fluid is present in the mastoid air cells bilaterally.  No obstructing nasopharyngeal lesion is evident.  Dedicated imaging of the hippocampal structures demonstrates symmetric size and signal.  IMPRESSION:  1.  No acute or focal abnormality to explain seizures. 2.  Minimal white matter changes slightly advanced for age. The finding is nonspecific but can be seen in the setting of chronic microvascular ischemia, a demyelinating process such as multiple sclerosis, vasculitis, complicated migraine headaches, or as the sequelae of a prior infectious or inflammatory process. 3.  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 4.  Bilateral mastoid effusions are likely related to the patient's intubated status.  Original Report Authenticated By: Jamesetta Orleans. MATTERN, M.D.   Dg Chest Portable 1 View  08/29/2011  *RADIOLOGY REPORT*  Clinical Data: Check ET tube placement, SOB  PORTABLE CHEST - 1 VIEW  Comparison: 08/28/2011  Findings: No ET tube is visualized.  There is a right arm PICC line with tip in the cavoatrial junction.  Heart size is normal.  Left upper lobe perihilar opacity has resolved from previous exam.  No new findings.  IMPRESSION:  1.  Interval resolution of left upper lobe perihilar opacity.  Original Report Authenticated By: Rosealee Albee, M.D.   Dg Chest Portable 1 View  08/28/2011  *RADIOLOGY REPORT*  Clinical Data: Checked ETT  PORTABLE CHEST - 1 VIEW  Comparison: 08/27/2011  Findings: Interval extubation.  Left mid lung opacity, suspicious for lingular pneumonia. No pleural effusion or pneumothorax.  Stable right IJ  venous catheter.  The heart is top normal in size.  IMPRESSION: Interval extubation.  Left mid lung opacity, suspicious for lingular pneumonia.  Original Report Authenticated By: Charline Bills, M.D.       ASSESSMENT: Cardiac arrest. Hypokalemia. Prolonged QT.  PLAN:  1) Now in sinus tachycardia.  QT interval has shortened somewhat.  Concerned about these episodes of syncope that he has had before.  Will need to discuss with his wife.  EP also seeing patient regarding needs for AICD. 2) Replace potassium.  Unclear why he has persistent hypokalemia since he is receiving 80 mEq /day and is off of lasix.    Ross Roots S.  08/29/2011  9:11 AM

## 2011-08-29 NOTE — Consult Note (Signed)
Subjective: Interval History: Patient is off sedation and conversant. Says that it is difficult for him to collect his thoughts, but otherwise is feeling well.   Objective: Vital signs in last 24 hours: Temp:  [98.9 F (37.2 C)] 98.9 F (37.2 C) (11/12 0000) Pulse Rate:  [31-117] 114  (11/12 0700) Resp:  [15-30] 18  (11/12 0700) BP: (109-167)/(81-114) 162/109 mmHg (11/12 0700) SpO2:  [80 %-100 %] 97 % (11/12 0700) Weight:  [100.3 kg (221 lb 1.9 oz)] 221 lb 1.9 oz (100.3 kg) (11/12 0500)  Intake/Output from previous day: 11/11 0701 - 11/12 0700 In: 506.4 [I.V.:256.4; IV Piggyback:250] Out: 2011 [Urine:2010; Stool:1]  AAO*3, no aphasia, follows complex commands, able to tell me months of the year forward, a little drowsy, EOMI, PERRL, no facial asymmetry, V1-V3 intact, tongue midline, VFF, motor: 5/5 in all ext, sensation: intact to pain throughout, no dysmetria on F to N  Lab Results:  Basename 08/29/11 0400 08/28/11 0650  WBC 8.4 7.2  HGB 14.9 14.1  HCT 42.8 41.4  PLT 134* 115*  NA 139 144  K 3.2* 3.2*  CL 101 106  CO2 25 28  GLUCOSE 106* 97  BUN 16 15  CREATININE 0.81 0.82  CALCIUM 9.7 9.2  LABA1C -- --   Studies/Results: Dg Chest Portable 1 View  08/29/2011  *RADIOLOGY REPORT*  Clinical Data: Check ET tube placement, SOB  PORTABLE CHEST - 1 VIEW  Comparison: 08/28/2011  Findings: No ET tube is visualized.  There is a right arm PICC line with tip in the cavoatrial junction.  Heart size is normal.  Left upper lobe perihilar opacity has resolved from previous exam.  No new findings.  IMPRESSION:  1.  Interval resolution of left upper lobe perihilar opacity.  Original Report Authenticated By: Rosealee Albee, M.D.   Dg Chest Portable 1 View  08/28/2011  *RADIOLOGY REPORT*  Clinical Data: Checked ETT  PORTABLE CHEST - 1 VIEW  Comparison: 08/27/2011  Findings: Interval extubation.  Left mid lung opacity, suspicious for lingular pneumonia. No pleural effusion or pneumothorax.   Stable right IJ venous catheter.  The heart is top normal in size.  IMPRESSION: Interval extubation.  Left mid lung opacity, suspicious for lingular pneumonia.  Original Report Authenticated By: Charline Bills, M.D.   Medications: I have reviewed the patient's current medications.  Assessment/Plan: 45 years old man s/p cardiac arrest after Benadryl overdose who presents with status epilepticus and was on propofol and then Precedex. Now off sedation since last night. EEG today. Then, will decide regarding further treatment with AED's.    LOS: 6 days   Clair Alfieri

## 2011-08-30 DIAGNOSIS — T50901A Poisoning by unspecified drugs, medicaments and biological substances, accidental (unintentional), initial encounter: Secondary | ICD-10-CM

## 2011-08-30 LAB — BASIC METABOLIC PANEL
BUN: 13 mg/dL (ref 6–23)
Calcium: 9.9 mg/dL (ref 8.4–10.5)
GFR calc non Af Amer: >90 mL/min (ref >90)
Glucose, Bld: 111 mg/dL — ABNORMAL HIGH (ref 70–99)

## 2011-08-30 MED ORDER — QUETIAPINE FUMARATE 25 MG PO TABS
25.0000 mg | ORAL_TABLET | Freq: Three times a day (TID) | ORAL | Status: DC
  Administered 2011-08-30: 25 mg via ORAL
  Filled 2011-08-30 (×2): qty 1

## 2011-08-30 MED ORDER — LORAZEPAM 2 MG/ML IJ SOLN
INTRAMUSCULAR | Status: AC
  Administered 2011-08-30: 2 mg via INTRAVENOUS
  Filled 2011-08-30: qty 1

## 2011-08-30 MED ORDER — POTASSIUM CHLORIDE CRYS ER 20 MEQ PO TBCR
30.0000 meq | EXTENDED_RELEASE_TABLET | Freq: Two times a day (BID) | ORAL | Status: AC
  Administered 2011-08-30 (×2): 30 meq via ORAL
  Filled 2011-08-30 (×2): qty 1

## 2011-08-30 MED ORDER — CLONAZEPAM 0.5 MG PO TABS
1.0000 mg | ORAL_TABLET | Freq: Every day | ORAL | Status: DC
  Administered 2011-08-30 – 2011-08-31 (×2): 1 mg via ORAL
  Filled 2011-08-30: qty 2
  Filled 2011-08-30: qty 1

## 2011-08-30 MED ORDER — MAGNESIUM OXIDE 400 MG PO TABS
400.0000 mg | ORAL_TABLET | Freq: Two times a day (BID) | ORAL | Status: DC
  Administered 2011-08-30 – 2011-09-01 (×5): 400 mg via ORAL
  Filled 2011-08-30 (×6): qty 1

## 2011-08-30 MED ORDER — LORAZEPAM 2 MG/ML IJ SOLN
2.0000 mg | Freq: Once | INTRAMUSCULAR | Status: AC
  Administered 2011-08-30: 2 mg via INTRAVENOUS

## 2011-08-30 NOTE — Consult Note (Signed)
Patient Identification:  Ross [name] Date of Evaluation:  08/30/2011   History of Present Illness: 45 year old African American male, with a history of PTSD, admitted on the medical floor due to seizure.  I spoke with the wife after patient gave the consent, as per her, patient restless at night, hear voices sometimes, she denied that patient was trying to kill himself on Vistaril.  She denied any past psych hospitalizations or suicide attempt.  Patient drinks sometimes.  Denies use of illicit drugs.  Patient is not any medications for PTSD.  As per RN, patient is agitated on and off.  Past Medical History:     Past Medical History  Diagnosis Date  . Sleep apnea     had surgery to correct it  . Mental disorder     PTSD  . Seizures     currently dx with seizures  . Cardiac arrest 08/23/2011       Past Surgical History  Procedure Date  . Tonsillectomy   . Uvulopalatopharyngoplasty, tonsillectomy and septoplasty 06/16/2000    Allergies: No Known Allergies  Current Medications:  Prior to Admission medications   Medication Sig Start Date End Date Taking? Authorizing Provider  cetaphil (CETAPHIL) cream Apply topically as needed. Apply after bathing    Yes Historical Provider, MD  diphenhydrAMINE (BENADRYL) 25 MG tablet Take 25 mg by mouth every 6 (six) hours as needed.     Yes Historical Provider, MD  gentamicin (GARAMYCIN) 0.1 % ointment Apply 1 application topically. After bathing    Yes Historical Provider, MD  hydrOXYzine (ATARAX/VISTARIL) 25 MG tablet Take 25 mg by mouth every 6 (six) hours as needed.    Yes Historical Provider, MD  loratadine (CLARITIN) 10 MG tablet Take 10 mg by mouth daily.     Yes Historical Provider, MD    Social History:    reports that he has never smoked. He quit smokeless tobacco use about 3 years ago. He reports that he drinks about .5 ounces of alcohol per week. He reports that he does not use illicit drugs.   Family History:    Family  History  Problem Relation Age of Onset  . Heart attack Mother     Mental Status Examination/Evaluation: Objective:  Appearance: Fairly Groomed  Psychomotor Activity:  Decreased  Eye Contact::  Fair  Speech:  Slow  Volume:  Decreased  Mood:  Anxious  Affect:  Restricted  Thought Process:  Redirectable during interview  Orientation:  Other:  he is alert and awake, oriented to place and person but not to time.  Thought Content:  Auditory hallucinations, present on and off, but not delusional  Suicidal Thoughts:  No  Homicidal Thoughts:  No  Judgement:  Other:  Fair  Insight:  Fair    Assessment:   AXIS I PTSD, Mood Disorder NOS  AXIS II  Deffered  AXIS III See medical notes.  AXIS IV unspecified  AXIS V 41-50 serious symptoms     Recommendations: 1.  Started the patient on Klonopin 0.25 mg po TID because history of sleep apnea. 2.  Started him of Seroquel 25 mg TID, to control his agitation and hallucinations. 3.  Will follow up as needed. 4. Patient does not need inpatient stabilization at this time, he can follow in the outpatient setting. Wife has no safety concerns for the patient.   Eulogio Ditch, MD 11/6/201211:40 AM

## 2011-08-30 NOTE — Progress Notes (Signed)
Patient extremely agitated and attempting to climb out of bed. Pt states that he wants to leave the hospital. Attempted to calm patient down. Page security and notified wife. Notified MD and orders received, will continue to monitor.  Natividad Brood 08/30/2011 6:50 AM

## 2011-08-30 NOTE — Progress Notes (Signed)
Pt complained of hearing voices and having visual hallucinations. Pt denies that these voices are harmful to himself or others. Sitter at bedside with patient. Relaxation and calming techniques provided along with emotional support. Reoriented patient to surroundings. Will make oncoming nurse and physician aware of the change in mental status. Natividad Brood  08/30/2011 4:56 AM

## 2011-08-30 NOTE — Consult Note (Signed)
Subjective: Patient was confused and hallucinating this am. He says he feel fine. He got Ativan this am for agitation.   Objective: Vital signs in last 24 hours: Temp:  [97.5 F (36.4 C)-99.4 F (37.4 C)] 98.5 F (36.9 C) (11/13 0350) Pulse Rate:  [92-115] 94  (11/13 0500) Resp:  [16-25] 20  (11/13 0700) BP: (114-147)/(83-95) 147/89 mmHg (11/13 0400) SpO2:  [96 %-100 %] 100 % (11/13 0500) Weight:  [99.3 kg (218 lb 14.7 oz)] 218 lb 14.7 oz (99.3 kg) (11/13 0447)  Intake/Output from previous day: 11/12 0701 - 11/13 0700 In: 1000 [P.O.:840; I.V.:60; IV Piggyback:100] Out: 275 [Urine:275]   Nutritional status: Carb Control  Neuro: AAO*1, no aphasia, follows complex commands, able to tell me months of the year forward, able to spell the word "world" backwards, confused, a little drowsy, psychomotor slowing, passed go-no go test, EOMI, PERRL, no facial asymmetry, V1-V3 intact, tongue midline, VFF, motor: 5/5 in all ext, sensation: intact to pain throughout, no dysmetria on F to N, patient keeps trying to climb out of bed.  Lab Results:  Basename 08/30/11 0625 08/29/11 1100 08/29/11 0400  WBC -- 8.4 8.4  HGB -- 14.4 14.9  HCT -- 41.3 42.8  PLT -- 129* 134*  NA 139 -- 139  K 3.2* -- 3.2*  CL 103 -- 101  CO2 21 -- 25  GLUCOSE 111* -- 106*  BUN 13 -- 16  CREATININE 0.82 -- 0.81  CALCIUM 9.9 -- 9.7  LABA1C -- -- --   Studies/Results: Dg Chest Portable 1 View  08/29/2011  *RADIOLOGY REPORT*  Clinical Data: Check ET tube placement, SOB  PORTABLE CHEST - 1 VIEW  Comparison: 08/28/2011  Findings: No ET tube is visualized.  There is a right arm PICC line with tip in the cavoatrial junction.  Heart size is normal.  Left upper lobe perihilar opacity has resolved from previous exam.  No new findings.  IMPRESSION:  1.  Interval resolution of left upper lobe perihilar opacity.  Original Report Authenticated By: Rosealee Albee, M.D.   Medications: I have reviewed the patient's current  medications.  Assessment/Plan: 45 years old man with cardiac arrest s/p benadryl overdose - his seizures are now well controlled on Dilantin. EEG does not show epileptiform activity. MRI brain was essentially normal, but patient does not appear to be completely cognitively intact. 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 I would recommend a switch to Depakote for behavior, but his platelets have been trending down. So I suggest keeping him on it for at least one month and letting him follow up with [facility] who can stop it and get an EEG to see if he needs to be on it long-term. He will need outpatient neurocognitive evaluation as well. PT/OT eval. Psych consult for ?Suicide attempt and agitation/confusion. Call with questions.   LOS: 7 days   Ross [name]
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
## 2011-08-30 NOTE — Progress Notes (Signed)
QT is shortening.  No arrhythmias.  Agree with beta blocker and avoiding ICD at this time. Avoid QT prolonging drugs.  Keep K>3.9 and Mg>1.9  Dr Eldridge Dace to manage from a cardiac standpoint and I will see as needed.

## 2011-08-30 NOTE — Consult Note (Signed)
MEDICATION RELATED CONSULT NOTE - FOLLOW UP   Pharmacy Consult for phenytoin Indication: new seizures  No Known Allergies  Patient Measurements: Height: 5\' 11"  (180.3 cm) Weight: 218 lb 14.7 oz (99.3 kg) IBW/kg (Calculated) : 75.3  Adjusted Body Weight: 84.9 kg  Vital Signs: Temp: 98.5 F (36.9 C) (11/13 0350) Temp src: Oral (11/13 0350) BP: 147/89 mmHg (11/13 0400) Pulse Rate: 94  (11/13 0500) Intake/Output from previous day: 11/12 0701 - 11/13 0700 In: 1000 [P.O.:840; I.V.:60; IV Piggyback:100] Out: 275 [Urine:275] Intake/Output from this shift: Total I/O In: 100 [P.O.:100] Out: -   Labs:  Basename 08/30/11 0625 08/29/11 1100 08/29/11 0400 08/28/11 0650  WBC -- 8.4 8.4 7.2  HGB -- 14.4 14.9 14.1  HCT -- 41.3 42.8 41.4  PLT -- 129* 134* 115*  APTT -- -- -- --  CREATININE 0.82 -- 0.81 0.82  LABCREA -- -- -- --  CREATININE 0.82 -- 0.81 0.82  [id number] -- -- -- --  MG -- -- -- --  PHOS -- -- -- --  ALBUMIN -- -- -- --  PROT -- -- -- --  ALBUMIN -- -- -- --  AST -- -- -- --  ALT -- -- -- --  ALKPHOS -- -- -- --  BILITOT -- -- -- --  BILIDIR -- -- -- --  IBILI -- -- -- --   Estimated Creatinine Clearance: 139.5 ml/min (by C-G formula based on Cr of 0.82).    Medications:  Scheduled:    . heparin subcutaneous  5,000 Units Subcutaneous Q8H  . LORazepam  2 mg Intravenous Once  . metoprolol tartrate  25 mg Oral BID  . phenytoin (DILANTIN) IV  100 mg Intravenous Q8H  . potassium chloride  30 mEq Oral BID  . potassium chloride  10 mEq Intravenous Q1 Hr x 4  . sodium chloride  3 mL Intravenous Q12H  . DISCONTD: antiseptic oral rinse  15 mL Mouth Rinse Q4H  . DISCONTD: famotidine (PEPCID) IV  20 mg Intravenous Q12H  . DISCONTD: methylPREDNISolone (SOLU-MEDROL) injection  40 mg Intravenous Daily  . DISCONTD: potassium chloride  10 mEq Intravenous Q1 Hr x 4   Infusions:    . sodium chloride 10 mL/hr at 08/29/11 2040   PRN: acetaminophen, acetaminophen,  calamine, hydrALAZINE, senna  Assessment: 41 YOM admitted 11/6 with AMS and accidental drug overdose due to ingesting 250mg  hydroxyzine and an OTC bottle of diphenhydramine for a persistent rash in a 24 hour period. He the developed tremulousness and muscle jerks, progressed into tonic-clonic seizure. Brain MRI with and without contrast negative. Patient is no longer sedated, only receiving ativan prn agitation.  Started on phenytoin 11/9 with good seizure control. EEG does not show epileptiform activity. Most current albumin 2.7 from 11/10. Phenytoin level this am = 14, corrected to 21.8 for low albumin. Goal per neuro is 18-22. Patient has been receiving nutrition consult and albumin may have risen. If so, his actual phenytoin level may be less than previously stated.  Patient started receiving PO meds yesterday. Will follow up if tolerating and assess phenytoin IV to PO change in 2-3 days.   Goal of Therapy:  Phenytoin level 18-22 per neuro  Plan:  1. Continue phenytoin 100mg  IV q 8 2. Follow up albumin level, phenytoin level  Laurence Slate, PharmD Candidate 08/30/2011,9:15 AM  Agree with plan above.  No other changes Harland German, Pharm D 08/30/2011 10:40 AM

## 2011-08-30 NOTE — Progress Notes (Signed)
Subjective: Confused and combative EKG shows QT shortening no arrhythmias  Objective: Vital signs in last 24 hours: Filed Vitals:   08/30/11 0600 08/30/11 0700 08/30/11 0800 08/30/11 0946  BP:   131/81   Pulse:    93  Temp:   98.1 F (36.7 C)   TempSrc:   Oral   Resp: 16 20 25    Height:      Weight:      SpO2:        Intake/Output Summary (Last 24 hours) at 08/30/11 1029 Last data filed at 08/30/11 0800  Gross per 24 hour  Intake    960 ml  Output    100 ml  Net    860 ml    Weight change: -1 kg (-2 lb 3.3 oz)  General: Alert, awake, oriented x3, in no acute distress. HEENT: No bruits, no goiter. Heart: Regular rate and rhythm, without murmurs, rubs, gallops. Lungs: Clear to auscultation bilaterally. Abdomen: Soft, nontender, nondistended, positive bowel sounds. Extremities: No clubbing cyanosis or edema with positive pedal pulses. Neuro: Grossly intact, nonfocal.    Lab Results: Results for orders placed during the hospital encounter of 08/23/11 (from the past 24 hour(s))  CBC     Status: Abnormal   Collection Time   08/29/11 11:00 AM      Component Value Range   WBC 8.4  4.0 - 10.5 (K/uL)   RBC 4.75  4.22 - 5.81 (MIL/uL)   Hemoglobin 14.4  13.0 - 17.0 (g/dL)   HCT 16.1  09.6 - 04.5 (%)   MCV 86.9  78.0 - 100.0 (fL)   MCH 30.3  26.0 - 34.0 (pg)   MCHC 34.9  30.0 - 36.0 (g/dL)   RDW 40.9  81.1 - 91.4 (%)   Platelets 129 (*) 150 - 400 (K/uL)  GLUCOSE, CAPILLARY     Status: Abnormal   Collection Time   08/29/11  8:41 PM      Component Value Range   Glucose-Capillary 127 (*) 70 - 99 (mg/dL)  PHENYTOIN LEVEL, TOTAL     Status: Normal   Collection Time   08/30/11  6:25 AM      Component Value Range   Phenytoin Lvl 14.0  10.0 - 20.0 (ug/mL)  BASIC METABOLIC PANEL     Status: Abnormal   Collection Time   08/30/11  6:25 AM      Component Value Range   Sodium 139  135 - 145 (mEq/L)   Potassium 3.2 (*) 3.5 - 5.1 (mEq/L)   Chloride 103  96 - 112 (mEq/L)   CO2 21  19 - 32 (mEq/L)   Glucose, Bld 111 (*) 70 - 99 (mg/dL)   BUN 13  6 - 23 (mg/dL)   Creatinine, Ser 7.82  0.50 - 1.35 (mg/dL)   Calcium 9.9  8.4 - 95.6 (mg/dL)   GFR calc non Af Amer >90  >90 (mL/min)   GFR calc Af Amer >90  >90 (mL/min)     Micro: Recent Results (from the past 240 hour(s))  MRSA PCR SCREENING     Status: Normal   Collection Time   08/25/11 11:31 AM      Component Value Range Status Comment   MRSA by PCR NEGATIVE  NEGATIVE  Final     Studies/Results: Dg Chest Portable 1 View  08/29/2011  * IMPRESSION:  1.  Interval resolution of left upper lobe perihilar opacity.  Original Report Authenticated By: Rosealee Albee, M.D.    Medications:     .  heparin subcutaneous  5,000 Units Subcutaneous Q8H  . LORazepam  2 mg Intravenous Once  . magnesium oxide  400 mg Oral BID  . metoprolol tartrate  25 mg Oral BID  . phenytoin (DILANTIN) IV  100 mg Intravenous Q8H  . potassium chloride  30 mEq Oral BID  . potassium chloride  10 mEq Intravenous Q1 Hr x 4  . sodium chloride  3 mL Intravenous Q12H  . DISCONTD: antiseptic oral rinse  15 mL Mouth Rinse Q4H     Assessment: Principal Problem:  Cardiac arrest Active Problems:  Seizure  Poisoning by anticholinergic drug  Papular rash, generalized  HTN (hypertension), malignant  Hyperglycemia  Thrombocytopenia  QT prolongation  Respiratory failure  Hypernatremia  Acute encephalopathy   Plan: ) S/P Cardiac arrest. Status post prolonged QT mediated ventricular fibrillation arrest. Felt to be secondary to unintentional Benadryl overdose. Cardiac catheterization demonstrates clean coronary arteries.  Plan:  -Continued telemetry monitoring  -Keep euvolemic  -Avoid QTC prolonging agents, will need to keep particular close eye while on Dilantin. Replete potassium and magnesium. The patient has been initiated on beta blocker 2) Respiratory failure, status post cardiac arrest, complicated by postictal state.  Resolved  3) Seizures/altered mental status. His last seizure was on November 9. He is now loaded with Dilantin. His MRI was negative. His EEG demonstrated burst suppression. Neurology consultation was obtained. 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 It is recommended that he continue on Depakote for now. Psychiatric consultation has been requested for his combative behavior. Plan:  -Continue Dilantin  -EEG pending at this time 4) Papular rash, generalized. Resolved  Plan:  -D/C Solumedrol and follow  5) HTN (hypertension), malignant. Seems to be exacerbated by agitation. Unable to take oral clonidine  Plan: Controlled  -Started on metoprolol 6) Hyperglycemia- Resolved. Begin diet and D/C CBGs  7) thrombocytopenia, currently stable and improved             ]    -Continue subcutaneous heparin  -Transfuse only for hemoglobin less than 7, or hemodynamic instability.  9) hypernatremia in setting of diuresis. Resolved              ]  10) hypokalemia  Plan:  -replace and recheck  Disposition he will probably need inpatient psychiatric stay versus admission to a skilled nursing facility. A PT OT evaluation has been placed.   LOS: 7 days   [facility] 08/30/2011, 10:29 AM

## 2011-08-30 NOTE — Progress Notes (Signed)
SUBJECTIVE: Feels ok.  He did try to leave the hospital this AM and he was given Ativan and security was called. OBJECTIVE:   Vitals:   Filed Vitals:   08/30/11 0500 08/30/11 0600 08/30/11 0700 08/30/11 0800  BP:      Pulse: 94     Temp:      TempSrc:      Resp: 25 16 20 25   Height:      Weight:      SpO2: 100%      I&O'[name]:   Intake/Output Summary (Last 24 hours) at 08/30/11 0910 Last data filed at 08/30/11 0800  Gross per 24 hour  Intake   1030 ml  Output    100 ml  Net    930 ml   TELEMETRY: Reviewed telemetry pt in NSR    PHYSICAL EXAM General: Well developed, well nourished, in no acute distress Head: Eyes PERRLA, No xanthomas.   Normal cephalic and atramatic  Lungs: Clear bilaterally to auscultation and percussion. Heart:   HRRR S1 S2 Pulses are 2+ & equal.            No carotid bruit. No JVD.  No abdominal bruits. No femoral bruits. Abdomen:  abdomen soft and non-tender without masses or                  Hernia'[name] noted. Msk:  Back normal, normal gait. Normal strength and tone for age. Extremities:   No clubbing, cyanosis or edema.  DP +1    LABS: Basic Metabolic Panel:  Basename 08/30/11 0625 08/29/11 0400  NA 139 139  K 3.2* 3.2*  CL 103 101  CO2 21 25  GLUCOSE 111* 106*  BUN 13 16  CREATININE 0.82 0.81  CALCIUM 9.9 9.7  MG -- --  PHOS -- --   Liver Function Tests: No results found for this basename: AST:2,ALT:2,ALKPHOS:2,BILITOT:2,PROT:2,ALBUMIN:2 in the last 72 hours No results found for this basename: LIPASE:2,AMYLASE:2 in the last 72 hours CBC:  Basename 08/29/11 1100 08/29/11 0400  WBC 8.4 8.4  NEUTROABS -- --  HGB 14.4 14.9  HCT 41.3 42.8  MCV 86.9 86.8  PLT 129* 134*   Cardiac Enzymes: No results found for this basename: CKTOTAL:3,CKMB:3,CKMBINDEX:3,TROPONINI:3 in the last 72 hours BNP: No results found for this basename: POCBNP:3 in the last 72 hours D-Dimer: No results found for this basename: DDIMER:2 in the last 72  hours Hemoglobin A1C: No results found for this basename: HGBA1C in the last 72 hours Fasting Lipid Panel: No results found for this basename: CHOL,HDL,LDLCALC,TRIG,CHOLHDL,LDLDIRECT in the last 72 hours Thyroid Function Tests: No results found for this basename: TSH,T4TOTAL,FREET3,T3FREE,THYROIDAB in the last 72 hours Anemia Panel: No results found for this basename: VITAMINB12,FOLATE,FERRITIN,TIBC,IRON,RETICCTPCT in the last 72 hours Coag Panel:   Lab Results  Component Value Date   INR 0.99 08/24/2011   INR 0.99 08/23/2011    RADIOLOGY: Ct Head Wo Contrast  08/24/2011  *RADIOLOGY REPORT*  Clinical Data: Seizure.  Cardiac arrest.  Evaluate for infarct.  CT HEAD WITHOUT CONTRAST  Technique:  Contiguous axial images were obtained from the base of the skull through the vertex without contrast.  Comparison: None.  Findings: There is no evidence for acute hemorrhage, hydrocephalus, mass lesion, or abnormal extra-axial fluid collection.  No definite CT evidence for acute infarction.  The visualized paranasal sinuses and mastoid air cells are clear.  IMPRESSION: Normal exam.  Original Report Authenticated By: ERIC A. MANSELL, M.D.   Mr Ross [name] Wo Contrast  08/27/2011  *RADIOLOGY REPORT*  Clinical Data: Seizure.  The patient recently had a large dose of antihistamines.  MRI HEAD WITHOUT AND WITH CONTRAST  Technique:  Multiplanar, multiecho pulse sequences of the brain and surrounding structures were obtained according to standard protocol without and with intravenous contrast  Contrast: 20mL MULTIHANCE GADOBENATE DIMEGLUMINE 529 MG/ML IV SOLN  Comparison: CT head without contrast 08/24/2011.  Findings: No acute infarct, hemorrhage, mass lesion is present. Mild periventricular white matter signal is noted.  Three or four punctate subcortical hyperdensities are seen as well.  There is no enhancement associated with these lesions.  There is an area of linear enhancement involving the right parietal lobe,  compatible with a benign venous angioma.  No other pathologic enhancement is evident.  Flow is present in the major intracranial arteries.  The globes orbits are intact.  The paranasal sinuses are clear.  Fluid is present in the mastoid air cells bilaterally.  No obstructing nasopharyngeal lesion is evident.  Dedicated imaging of the hippocampal structures demonstrates symmetric size and signal.  IMPRESSION:  1.  No acute or focal abnormality to explain seizures. 2.  Minimal white matter changes slightly advanced for age. The finding is nonspecific but can be seen in the setting of chronic microvascular ischemia, a demyelinating process such as multiple sclerosis, vasculitis, complicated migraine headaches, or as the sequelae of a prior infectious or inflammatory process. 3.  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 4.  Bilateral mastoid effusions are likely related to the [name] intubated status.  Original Report Authenticated By: Jamesetta Orleans. MATTERN, M.D.   Dg Chest Portable 1 View  08/29/2011  *RADIOLOGY REPORT*  Clinical Data: Check ET tube placement, SOB  PORTABLE CHEST - 1 VIEW  Comparison: 08/28/2011  Findings: No ET tube is visualized.  There is a right arm PICC line with tip in the cavoatrial junction.  Heart size is normal.  Left upper lobe perihilar opacity has resolved from previous exam.  No new findings.  IMPRESSION:  1.  Interval resolution of left upper lobe perihilar opacity.  Original Report Authenticated By: Rosealee Albee, M.D.   Dg Chest Portable 1 View  08/28/2011  *RADIOLOGY REPORT*  Clinical Data: Checked ETT  PORTABLE CHEST - 1 VIEW  Comparison: 08/27/2011  Findings: Interval extubation.  Left mid lung opacity, suspicious for lingular pneumonia. No pleural effusion or pneumothorax.  Stable right IJ venous catheter.  The heart is top normal in size.  IMPRESSION: Interval extubation.  Left mid lung opacity, suspicious for lingular pneumonia.  Original  Report Authenticated By: Charline Bills, M.D.      ASSESSMENT: Cardiac arrest, prolonged QT, hypokalemia  PLAN: Metoprolol started.  Hoping to avoid AICD per last note from EP.  No CAD by cath. Replace potassium.  Psych consult pending re: mental status changes.  Ross [name].  08/30/2011  9:10 AM

## 2011-08-30 NOTE — Progress Notes (Signed)
Patient extremely agitated overnight, confused, hallucinating. Security called. Patient now threatening to leave. Single dose ativan given.

## 2011-08-31 LAB — COMPREHENSIVE METABOLIC PANEL
BUN: 11 mg/dL (ref 6–23)
CO2: 24 mEq/L (ref 19–32)
Calcium: 9.1 mg/dL (ref 8.4–10.5)
Creatinine, Ser: 0.89 mg/dL (ref 0.50–1.35)
GFR calc Af Amer: >90 mL/min (ref >90)
GFR calc non Af Amer: >90 mL/min (ref >90)
Glucose, Bld: 137 mg/dL — ABNORMAL HIGH (ref 70–99)
Sodium: 139 mEq/L (ref 135–145)
Total Protein: 6.2 g/dL (ref 6.0–8.3)

## 2011-08-31 LAB — URINALYSIS, ROUTINE W REFLEX MICROSCOPIC
Glucose, UA: NEGATIVE mg/dL
Leukocytes, UA: NEGATIVE
Protein, ur: 30 mg/dL — AB
Specific Gravity, Urine: 1.028 (ref 1.005–1.030)

## 2011-08-31 LAB — URINE MICROSCOPIC-ADD ON

## 2011-08-31 LAB — CBC
HCT: 39.4 % (ref 39.0–52.0)
Hemoglobin: 13.6 g/dL (ref 13.0–17.0)
MCHC: 34.5 g/dL (ref 30.0–36.0)
RBC: 4.54 MIL/uL (ref 4.22–5.81)
WBC: 7.8 10*3/uL (ref 4.0–10.5)

## 2011-08-31 MED ORDER — POTASSIUM CHLORIDE CRYS ER 20 MEQ PO TBCR
60.0000 meq | EXTENDED_RELEASE_TABLET | Freq: Two times a day (BID) | ORAL | Status: AC
  Administered 2011-08-31 (×2): 60 meq via ORAL
  Filled 2011-08-31 (×3): qty 3

## 2011-08-31 MED ORDER — PHENYTOIN SODIUM EXTENDED 100 MG PO CAPS
300.0000 mg | ORAL_CAPSULE | Freq: Every day | ORAL | Status: DC
  Administered 2011-09-01: 300 mg via ORAL
  Filled 2011-08-31: qty 3

## 2011-08-31 MED ORDER — PHENYTOIN SODIUM 50 MG/ML IJ SOLN
100.0000 mg | Freq: Three times a day (TID) | INTRAMUSCULAR | Status: AC
  Administered 2011-08-31: 100 mg via INTRAVENOUS
  Filled 2011-08-31: qty 2

## 2011-08-31 MED ORDER — LISINOPRIL 5 MG PO TABS
5.0000 mg | ORAL_TABLET | Freq: Every day | ORAL | Status: DC
  Administered 2011-08-31 – 2011-09-01 (×2): 5 mg via ORAL
  Filled 2011-08-31 (×2): qty 1

## 2011-08-31 NOTE — Consult Note (Signed)
MEDICATION RELATED CONSULT NOTE - FOLLOW UP   Pharmacy Consult for phenytoin Indication: new seizure in setting of anti-cholinergic overdose  No Known Allergies  Patient Measurements: Height: 5\' 11"  (180.3 cm) Weight: 221 lb 5.5 oz (100.4 kg) IBW/kg (Calculated) : 75.3  Adjusted Body Weight: 84.9 kg  Vital Signs: Temp: 98.5 F (36.9 C) (11/14 1155) Temp src: Oral (11/14 1155) BP: 108/80 mmHg (11/14 1155) Intake/Output from previous day: 11/13 0701 - 11/14 0700 In: 843 [P.O.:840; I.V.:3] Out: 250 [Urine:250]  Labs:  [facility] 08/31/11 0954 08/31/11 0548 08/30/11 0625 08/29/11 1100 08/29/11 0400  WBC 7.8 -- -- 8.4 8.4  HGB 13.6 -- -- 14.4 14.9  HCT 39.4 -- -- 41.3 42.8  PLT 158 -- -- 129* 134*  APTT -- -- -- -- --  CREATININE -- 0.89 0.82 -- 0.81  LABCREA -- -- -- -- --  CREATININE -- 0.89 0.82 -- 0.81  CREAT24HRUR -- -- -- -- --  MG -- -- -- -- --  PHOS -- -- -- -- --  ALBUMIN -- 2.6* -- -- --  PROT -- 6.2 -- -- --  ALBUMIN -- 2.6* -- -- --  AST -- 34 -- -- --  ALT -- 19 -- -- --  ALKPHOS -- 111 -- -- --  BILITOT -- 0.5 -- -- --  BILIDIR -- -- -- -- --  IBILI -- -- -- -- --   Estimated Creatinine Clearance: 129.1 ml/min (by C-G formula based on Cr of 0.89).   Medications:  Scheduled:    . clonazePAM  1 mg Oral QHS  . heparin subcutaneous  5,000 Units Subcutaneous Q8H  . lisinopril  5 mg Oral Daily  . magnesium oxide  400 mg Oral BID  . metoprolol tartrate  25 mg Oral BID  . phenytoin (DILANTIN) IV  100 mg Intravenous Q8H  . potassium chloride  30 mEq Oral BID  . potassium chloride  60 mEq Oral BID  . sodium chloride  3 mL Intravenous Q12H  . DISCONTD: QUEtiapine  25 mg Oral TID   Infusions:    . sodium chloride 10 mL/hr at 08/29/11 2040    Assessment: Ross [name] is a 45YOM admitted with AMS in setting of anticholinergic overdose which precipitated new onset seizure and prolonged QT mediated ventricular fibrillation arrest. Patient started on  phenytoin 100mg  IV q8 hours on 11/9 with good seizure control and tolerating it well. Phenytoin level from 11/13 is 14, adjusted to 22.5 for albumin of 2.6. Goal per neuro 18-22. SCr stable, est GFR 128 mL/min. LFTs WNL. CBC ok.  Patient currently tolerating PO meds, therefore will change phenytoin from 100mg  IV q8 hours to 300mg  PO daily. Bioavailability of PO phenytoin 0.95 of IV and should slightly reduce phenytoin level back into target range.  Goal of Therapy:  Phenytoin level 18-22 per neuro  Plan:  1. Change phenytoin to 300mg  PO daily 2. Follow up CBC, renal function, albumin in AM  Ross [name] 08/31/2011,1:03 PM  I agree with the assessment and plan. Ross [name], Ross [name] 1:58 PM. 08/31/2011

## 2011-08-31 NOTE — Progress Notes (Signed)
08/31/2011 Ross [name] SPARKS Case Management Note [phone]  Utilization review completed.  

## 2011-08-31 NOTE — Progress Notes (Signed)
Physical Therapy Evaluation Patient Details Name: Ross [name] MRN: [id number] DOB: 01/30/1966 Today's Date: 08/31/2011  Problem List:  Patient Active Problem List  Diagnoses  . Seizure  . Poisoning by anticholinergic drug  . Eczema  . Papular rash, generalized  . HTN (hypertension), malignant  . Hyperglycemia  . Thrombocytopenia  . PTSD (post-traumatic stress disorder)  . Cardiac arrest  . QT prolongation  . Respiratory failure  . Hypernatremia  . Acute encephalopathy    Past Medical History:  Past Medical History  Diagnosis Date  . Sleep apnea     had surgery to correct it  . Mental disorder     PTSD  . Seizures     currently dx with seizures  . Cardiac arrest 08/23/2011   Past Surgical History:  Past Surgical History  Procedure Date  . Tonsillectomy   . Uvulopalatopharyngoplasty, tonsillectomy and septoplasty 06/16/2000    PT Assessment/Plan/Recommendation PT Assessment Clinical Impression Statement: pt would benefit from skilled PT to improve overall mobility, balance, and safety for D/C to home.   PT Recommendation/Assessment: Patient will need skilled PT in the acute care venue PT Problem List: Decreased activity tolerance;Decreased balance;Decreased mobility;Decreased cognition;Decreased knowledge of use of DME;Decreased safety awareness;Cardiopulmonary status limiting activity Barriers to Discharge: None PT Therapy Diagnosis : Difficulty walking PT Plan PT Frequency: Min 3X/week PT Treatment/Interventions: DME instruction;Gait training;Stair training;Functional mobility training;Therapeutic exercise;Balance training;Neuromuscular re-education;Cognitive remediation;Patient/family education PT Recommendation Follow Up Recommendations: Home health PT;24 hour supervision/assistance Equipment Recommended: None recommended by PT PT Goals  Acute Rehab PT Goals PT Goal Formulation: With patient/family Time For Goal Achievement: 2 weeks Pt will go  Supine/Side to Sit: Independently PT Goal: Supine/Side to Sit - Progress: Progressing toward goal Pt will Transfer Sit to Stand/Stand to Sit: Independently;with upper extremity assist PT Transfer Goal: Sit to Stand/Stand to Sit - Progress: Progressing toward goal Pt will Ambulate: >150 feet;with supervision (AD TBD) PT Goal: Ambulate - Progress: Progressing toward goal Pt will Go Up / Down Stairs: Flight;with modified independence;with rail(s) PT Goal: Up/Down Stairs - Progress: Not met  PT Evaluation Precautions/Restrictions  Precautions Precautions: Fall Restrictions Weight Bearing Restrictions: No Prior Functioning  Home Living Lives With: Spouse Receives Help From: Family Type of Home: House Home Layout: Multi-level;1/2 bath on main level Alternate Level Stairs-Rails: Right Alternate Level Stairs-Number of Steps: full flight Home Access: Stairs to enter Entrance Stairs-Rails: None Entrance Stairs-Number of Steps: 2 Home Adaptive Equipment: None Prior Function Level of Independence: Independent with basic ADLs;Independent with homemaking with ambulation Able to Take Stairs?: Reciprically Cognition Cognition Orientation Level: Oriented to person;Disoriented to time;Disoriented to situation Sensation/Coordination Sensation Light Touch: Appears Intact Extremity Assessment RLE Assessment RLE Assessment: Within Functional Limits LLE Assessment LLE Assessment: Within Functional Limits Mobility (including Balance) Bed Mobility Bed Mobility: Yes Supine to Sit: 6: Modified independent (Device/Increase time);With rails;HOB flat Sitting - Scoot to Edge of Bed: 7: Independent Transfers Transfers: Yes Sit to Stand: 4: Min assist;With upper extremity assist;From bed Sit to Stand Details (indicate cue type and reason): Mildly unsteady.  A for balance only.   Stand to Sit: 4: Min assist;With armrests;To chair/3-in-1 Stand to Sit Details: cues to get closer to recliner prior to  sitting Ambulation/Gait Ambulation/Gait: Yes Ambulation/Gait Assistance: 4: Min assist Ambulation/Gait Assistance Details (indicate cue type and reason): A for balance only.  pt with mild lateral sway and decreased safety awareness.   Ambulation Distance (Feet): 40 Feet Assistive device: None Gait Pattern: Shuffle Stairs: No    Exercise  End of Session PT - End of Session Equipment Utilized During Treatment: Gait belt Activity Tolerance: Patient tolerated treatment well Patient left: in chair;with call bell in reach;with family/visitor present Nurse Communication: Mobility status for transfers;Mobility status for ambulation General Behavior During Session: [facility] for tasks performed Cognition: Impaired Cognitive Impairment: pt with decreased safety awareness, awareness of deficits, memory  Sunny Schlein, [place] [phone] 08/31/2011, 11:12 AM

## 2011-08-31 NOTE — Progress Notes (Addendum)
SUBJECTIVE:  No pain.  Breathing better  OBJECTIVE:   Vitals:   Filed Vitals:   08/30/11 2200 08/31/11 0000 08/31/11 0400 08/31/11 0500  BP:  125/85 129/81   Pulse: 92     Temp:  99 F (37.2 C) 99.3 F (37.4 C)   TempSrc:  Axillary Oral   Resp:  22 19   Height:      Weight:    100.4 kg (221 lb 5.5 oz)  SpO2:  98% 97%    I&O's:   Intake/Output Summary (Last 24 hours) at 08/31/11 0840 Last data filed at 08/31/11 0604  Gross per 24 hour  Intake    743 ml  Output    250 ml  Net    493 ml   TELEMETRY: Reviewed telemetry pt in Sinus tachycardia:     PHYSICAL EXAM General: Well developed, well nourished, in no acute distress Head: Eyes PERRLA, No xanthomas.   Normal cephalic and atramatic  Lungs:   Clear bilaterally to auscultation and percussion. Heart:  Tachycardic S1 S2 Pulses are 2+ & equal.            No abdominal bruits. No femoral bruits. Abdomen:  abdomen soft and non-tender without masses or                  Hernia's noted.  Extremities:   No clubbing, cyanosis or edema.   Psych:  Good affect, responds appropriately   LABS: Basic Metabolic Panel:  Basename 08/31/11 0548 08/30/11 0625  NA 139 139  K 2.8* 3.2*  CL 102 103  CO2 24 21  GLUCOSE 137* 111*  BUN 11 13  CREATININE 0.89 0.82  CALCIUM 9.1 9.9  MG -- --  PHOS -- --   Liver Function Tests:  [facility] 08/31/11 0548  AST 34  ALT 19  ALKPHOS 111  BILITOT 0.5  PROT 6.2  ALBUMIN 2.6*   No results found for this basename: LIPASE:2,AMYLASE:2 in the last 72 hours CBC:  Basename 08/29/11 1100 08/29/11 0400  WBC 8.4 8.4  NEUTROABS -- --  HGB 14.4 14.9  HCT 41.3 42.8  MCV 86.9 86.8  PLT 129* 134*   Cardiac Enzymes: No results found for this basename: CKTOTAL:3,CKMB:3,CKMBINDEX:3,TROPONINI:3 in the last 72 hours BNP: No results found for this basename: POCBNP:3 in the last 72 hours D-Dimer: No results found for this basename: DDIMER:2 in the last 72 hours Hemoglobin A1C: No results found  for this basename: HGBA1C in the last 72 hours Fasting Lipid Panel: No results found for this basename: CHOL,HDL,LDLCALC,TRIG,CHOLHDL,LDLDIRECT in the last 72 hours Thyroid Function Tests: No results found for this basename: TSH,T4TOTAL,FREET3,T3FREE,THYROIDAB in the last 72 hours Anemia Panel: No results found for this basename: VITAMINB12,FOLATE,FERRITIN,TIBC,IRON,RETICCTPCT in the last 72 hours Coag Panel:   Lab Results  Component Value Date   INR 0.99 08/24/2011   INR 0.99 08/23/2011    RADIOLOGY: Ct Head Wo Contrast  08/24/2011  *RADIOLOGY REPORT*  Clinical Data: Seizure.  Cardiac arrest.  Evaluate for infarct.  CT HEAD WITHOUT CONTRAST  Technique:  Contiguous axial images were obtained from the base of the skull through the vertex without contrast.  Comparison: None.  Findings: There is no evidence for acute hemorrhage, hydrocephalus, mass lesion, or abnormal extra-axial fluid collection.  No definite CT evidence for acute infarction.  The visualized paranasal sinuses and mastoid air cells are clear.  IMPRESSION: Normal exam.  Original Report Authenticated By: ERIC A. MANSELL, M.D.    ECG: NSR, biphasic T waves;  QTc 487 ms    ASSESSMENT: Cardiac arrest status post prolonged QT. QT interval shortening.  Mental status better from a few days ago.   PLAN:  No plans for AICD.  Correcting electrolytes.  Patient hypertensive at times.  Mildly decreased LVEF, likely related to stunning from shocks and acute event.  Will add low dose ACE-I to see if this helps keep potassium at correct level.    Briann Sarchet S.  08/31/2011  8:40 AM

## 2011-08-31 NOTE — Progress Notes (Signed)
Subjective:  No confusion, awake and alert. Denies any complaints. No seizures, no palpitations. Has low grade fever.  Objective: Vital signs in last 24 hours: Filed Vitals:   08/30/11 2200 08/31/11 0000 08/31/11 0400 08/31/11 0500  BP:  125/85 129/81   Pulse: 92     Temp:  99 F (37.2 C) 99.3 F (37.4 C)   TempSrc:  Axillary Oral   Resp:  22 19   Height:      Weight:    100.4 kg (221 lb 5.5 oz)  SpO2:  98% 97%     Intake/Output Summary (Last 24 hours) at 08/31/11 1610 Last data filed at 08/31/11 0604  Gross per 24 hour  Intake    743 ml  Output    250 ml  Net    493 ml    Weight change: 1.1 kg (2 lb 6.8 oz)  General: Alert, awake, oriented x3, in no acute distress. HEENT: No bruits, no goiter. Heart: Regular rate and rhythm, without murmurs, rubs, gallops. Lungs: Clear to auscultation bilaterally. Abdomen: Soft, nontender, nondistended, positive bowel sounds. Extremities: No clubbing cyanosis or edema with positive pedal pulses. Neuro: Grossly intact, nonfocal.    Lab Results: Results for orders placed during the hospital encounter of 08/23/11 (from the past 24 hour(s))  COMPREHENSIVE METABOLIC PANEL     Status: Abnormal   Collection Time   08/31/11  5:48 AM      Component Value Range   Sodium 139  135 - 145 (mEq/L)   Potassium 2.8 (*) 3.5 - 5.1 (mEq/L)   Chloride 102  96 - 112 (mEq/L)   CO2 24  19 - 32 (mEq/L)   Glucose, Bld 137 (*) 70 - 99 (mg/dL)   BUN 11  6 - 23 (mg/dL)   Creatinine, Ser 9.60  0.50 - 1.35 (mg/dL)   Calcium 9.1  8.4 - 45.4 (mg/dL)   Total Protein 6.2  6.0 - 8.3 (g/dL)   Albumin 2.6 (*) 3.5 - 5.2 (g/dL)   AST 34  0 - 37 (U/L)   ALT 19  0 - 53 (U/L)   Alkaline Phosphatase 111  39 - 117 (U/L)   Total Bilirubin 0.5  0.3 - 1.2 (mg/dL)   GFR calc non Af Amer >90  >90 (mL/min)   GFR calc Af Amer >90  >90 (mL/min)     Micro: Recent Results (from the past 240 hour(s))  MRSA PCR SCREENING     Status: Normal   Collection Time   08/25/11  11:31 AM      Component Value Range Status Comment   MRSA by PCR NEGATIVE  NEGATIVE  Final     Studies/Results: Dg Chest Portable 1 View  08/29/2011  * IMPRESSION:  1.  Interval resolution of left upper lobe perihilar opacity.  Original Report Authenticated By: Rosealee Albee, M.D.    Medications:     . clonazePAM  1 mg Oral QHS  . heparin subcutaneous  5,000 Units Subcutaneous Q8H  . magnesium oxide  400 mg Oral BID  . metoprolol tartrate  25 mg Oral BID  . phenytoin (DILANTIN) IV  100 mg Intravenous Q8H  . potassium chloride  30 mEq Oral BID  . sodium chloride  3 mL Intravenous Q12H  . DISCONTD: QUEtiapine  25 mg Oral TID     Assessment: Principal Problem:  Cardiac arrest Active Problems:  Seizure  Poisoning by anticholinergic drug  Papular rash, generalized  HTN (hypertension), malignant  Hyperglycemia  Thrombocytopenia  QT  prolongation  Respiratory failure  Hypernatremia  Acute encephalopathy   Plan: 1) S/P Cardiac arrest. Status post prolonged QT mediated ventricular fibrillation arrest. Felt to be secondary to unintentional Benadryl overdose. Cardiac catheterization demonstrates clean coronary arteries.  Plan:  -Continued telemetry monitoring  -Keep euvolemic  -Avoid QTC prolonging agents, will need to keep particular close eye while on Dilantin. Replete potassium and magnesium. The patient has been initiated on beta blocker.  2) Respiratory failure, status post cardiac arrest, complicated by postictal state. Resolved   3) Seizures/altered mental status. His last seizure was on November 9. He is now loaded with Dilantin. His MRI was negative. His EEG demonstrated burst suppression. Neurology consultation was obtained. 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 It is recommended that he continue on Depakote for now. Psychiatric consultation has been requested for his combative behavior. Plan:    -Continue Dilantin  -EEG pending at this time  4) Papular rash, generalized. Resolved  Plan:  -D/C Solumedrol and follow   5) HTN (hypertension), malignant. Seems to be exacerbated by agitation. Unable to take oral clonidine  Plan: Controlled  -Started on metoprolol  6) Hyperglycemia- Resolved. Begin diet and D/C CBGs   7) thrombocytopenia, currently stable and improved -Continue subcutaneous heparin   9) hypernatremia in setting of diuresis. Resolved   10) hypokalemia  Plan:  -replace and recheck  11) Low grade fever: no symptoms will check his UA, if spike again will do pan-culture.  Disposition he will probably need inpatient psychiatric stay versus admission to a skilled nursing facility. A PT/OT evaluation has been placed. Awaits PT/OT evaluation. If    LOS: 8 days   Kyndall Amero A 08/31/2011, 8:08 AM

## 2011-08-31 NOTE — Progress Notes (Signed)
Occupational Therapy Evaluation Patient Details Name: Ross [name] MRN: [id number] DOB: November 21, 1965 Today's Date: 08/31/2011 Ross [name]  [id number] Problem List:  Patient Active Problem List  Diagnoses  . Seizure  . Poisoning by anticholinergic drug  . Eczema  . Papular rash, generalized  . HTN (hypertension), malignant  . Hyperglycemia  . Thrombocytopenia  . PTSD (post-traumatic stress disorder)  . Cardiac arrest  . QT prolongation  . Respiratory failure  . Hypernatremia  . Acute encephalopathy    Past Medical History:  Past Medical History  Diagnosis Date  . Sleep apnea     had surgery to correct it  . Mental disorder     PTSD  . Seizures     currently dx with seizures  . Cardiac arrest 08/23/2011   Past Surgical History:  Past Surgical History  Procedure Date  . Tonsillectomy   . Uvulopalatopharyngoplasty, tonsillectomy and septoplasty 06/16/2000    OT Assessment/Plan/Recommendation OT Assessment Clinical Impression Statement: Pt admitted for overdose with deficits with ADLS secondary to decreased balance and cognition.  Pt would benefit from continuted OT to increase independence with basic adls and adl transfers.  OT Recommendation/Assessment: Patient will need skilled OT in the acute care venue OT Problem List: Impaired balance (sitting and/or standing);Decreased safety awareness;Decreased knowledge of precautions Barriers to Discharge Comments: has 24 hour S available. OT Therapy Diagnosis : Cognitive deficits OT Plan OT Frequency: Min 2X/week OT Treatment/Interventions: Self-care/ADL training;Cognitive remediation/compensation OT Recommendation Follow Up Recommendations: Home health OT Equipment Recommended: Tub/shower seat;Other (comment) (may or may not need by time of d/c) Individuals Consulted Consulted and Agree with Results and Recommendations: Patient;Family member/caregiver Family Member Consulted: wife OT Goals Acute Rehab OT Goals OT Goal  Formulation: With patient Time For Goal Achievement: 2 weeks ADL Goals Pt Will Perform Grooming: Independently;Standing at sink ADL Goal: Grooming - Progress: Other (comment) Pt Will Perform Lower Body Bathing: with modified independence;Sitting in shower ADL Goal: Lower Body Bathing - Progress: Other (comment) Pt Will Perform Lower Body Dressing: Independently;Sit to stand from chair (gathering own clothes) ADL Goal: Lower Body Dressing - Progress: Other (comment) Additional ADL Goal #1: Pt will complete all aspects of toileting with Mod I ADL Goal: Additional Goal #1 - Progress: Other (comment) Miscellaneous OT Goals Miscellaneous OT Goal #1: Pt will complete simple money tasks with no assist. OT Goal: Miscellaneous Goal #1 - Progress: Other (comment)  OT Evaluation Precautions/Restrictions  Precautions Precautions: Fall Precaution Comments: Pt impulsive Required Braces or Orthoses: No Restrictions Weight Bearing Restrictions: No Prior Functioning Home Living Lives With: Spouse Receives Help From: Family Type of Home: House Home Layout: Multi-level;1/2 bath on main level Alternate Level Stairs-Rails: Right Alternate Level Stairs-Number of Steps: full flight Home Access: Stairs to enter Entrance Stairs-Rails: None Entrance Stairs-Number of Steps: 2 Bathroom Shower/Tub: Chief Operating Officer Equipment: None Prior Function Level of Independence: Independent with basic ADLs;Independent with homemaking with ambulation Able to Take Stairs?: Reciprically Driving: No Vocation: On disability ADL ADL Eating/Feeding: Performed;Independent Where Assessed - Eating/Feeding: Edge of bed Grooming: Performed;Wash/dry hands;Wash/dry face;Minimal assistance Grooming Details (indicate cue type and reason): min guard to stand at sink Where Assessed - Grooming: Sitting at sink Upper Body Bathing: Simulated;Chest;Right arm;Left arm;Abdomen;Set  up Upper Body Bathing Details (indicate cue type and reason): safety issues on side of bed....wanting to stand. Where Assessed - Upper Body Bathing: Sitting, bed Lower Body Bathing: Performed;Minimal assistance Lower Body Bathing Details (indicate cue type and reason): min assist for safety Where Assessed -  Lower Body Bathing: Sit to stand from chair Upper Body Dressing: Simulated;Supervision/safety Where Assessed - Upper Body Dressing: Sitting, bed Lower Body Dressing: Performed;Minimal assistance Lower Body Dressing Details (indicate cue type and reason): assist when standing.  Pt unsteady on feet and impulsive Where Assessed - Lower Body Dressing: Sit to stand from chair Toilet Transfer: Performed;Minimal assistance Toilet Transfer Details (indicate cue type and reason): min assist for balance when up.  Cues to ask for assist when up. Toilet Transfer Method: Proofreader: Comfort height toilet;Grab bars Toileting - Clothing Manipulation: Supervision/safety Where Assessed - Health visitor - Hygiene: Set up;Supervision/safety Toileting - Hygiene Details (indicate cue type and reason): supervision and cues when on feet for safety Where Assessed - Toileting Hygiene: Standing Tub/Shower Transfer: Not assessed ADL Comments: Pt overall doing much better with adls.  Pt cont to be fall risk when on feet Vision/Perception    Cognition Cognition Arousal/Alertness: Awake/alert Overall Cognitive Status: Impaired Orientation Level: Oriented X4 Safety/Judgement: Decreased awareness of safety precautions;Decreased safety judgement for tasks assessed Decreased Safety/Judgement: Impulsive Safety/Judgement - Other Comments: Pt was very independent prior to admit and is used to doing for himself and now is impulsive when up. Executive Functioning: Pt slow to respond to higher level questioning but was accurate with safety questions and  multiple step questioning Sensation/Coordination Sensation Light Touch: Appears Intact Coordination Gross Motor Movements are Fluid and Coordinated: Yes Fine Motor Movements are Fluid and Coordinated: Yes Extremity Assessment RUE Assessment RUE Assessment: Within Functional Limits LUE Assessment LUE Assessment: Within Functional Limits Mobility  Bed Mobility Bed Mobility: Yes Supine to Sit: 5: Supervision Sitting - Scoot to Edge of Bed: 5: Supervision Sitting - Scoot to Edge of Bed Details (indicate cue type and reason): supervision for safety.  Pt continually attempting to stand. Transfers Transfers: Yes Sit to Stand: 4: Min assist;From bed;From toilet (min guard) Sit to Stand Details (indicate cue type and reason): min guard for safety Stand to Sit: 5: Supervision;With armrests;To chair/3-in-1 Stand to Sit Details: cues to get closer to recliner prior to sitting Exercises   End of Session OT - End of Session Activity Tolerance: Patient tolerated treatment well Patient left: in chair;with call bell in reach;with family/visitor present Nurse Communication: Mobility status for ambulation General Behavior During Session: [facility] for tasks performed Cognition: Impaired Cognitive Impairment: pt with decreased safety awareness, awareness of deficits, memory   Hope Budds 08/31/2011, 2:29 PM   [phone]

## 2011-09-01 ENCOUNTER — Other Ambulatory Visit: Payer: Self-pay

## 2011-09-01 LAB — MAGNESIUM: Magnesium: 1.9 mg/dL (ref 1.5–2.5)

## 2011-09-01 LAB — BASIC METABOLIC PANEL
CO2: 23 mEq/L (ref 19–32)
Calcium: 9.4 mg/dL (ref 8.4–10.5)
Creatinine, Ser: 0.86 mg/dL (ref 0.50–1.35)
GFR calc non Af Amer: >90 mL/min (ref >90)
Glucose, Bld: 89 mg/dL (ref 70–99)

## 2011-09-01 MED ORDER — METOPROLOL TARTRATE 25 MG PO TABS: 25.0000 mg | ORAL_TABLET | Freq: Two times a day (BID) | ORAL | Status: DC

## 2011-09-01 MED ORDER — MAGNESIUM OXIDE 400 MG PO TABS: 400.0000 mg | ORAL_TABLET | Freq: Two times a day (BID) | ORAL | Status: AC

## 2011-09-01 MED ORDER — PHENYTOIN SODIUM EXTENDED 300 MG PO CAPS: 300.0000 mg | ORAL_CAPSULE | Freq: Every day | ORAL | Status: DC

## 2011-09-01 MED ORDER — LISINOPRIL 5 MG PO TABS: 5.0000 mg | ORAL_TABLET | Freq: Every day | ORAL | Status: DC

## 2011-09-01 NOTE — Progress Notes (Signed)
Spoke with patient and his wife about HHC. They chose [facility] For HHPT. Patient's wife stated that they do not need tub bench due to having a shower with a seat. Contacted Marie at [facility] and requested HHPT. Patient's PCP listed as [facility], contacted [employer] and told that patient does not have a PCP assigned and they would not be able to cover HHC. Contacted Dr. Virginia Rochester, he will follow for [facility]. Notified Marie at [facility] and iupdated her regarding PCP.Request entered in [facility].

## 2011-09-01 NOTE — Progress Notes (Signed)
Clinical Social Work: Engineer, technical sales  CSW met with patient this morning at bedside. Patient much more alert and oriented today, able to tell me reason for hospital admission, season, holiday coming up, remote and recent memory intact.   Patient mood is calm and cooperative with affect congruent.  He is logical and goal oriented.  Not internally preoccupied.  Patient reports no SI, HI, Psychosis at this time.  Denies any auditory and visual hallucinations.  Reports he slept very well last night and also has a good appetite.  No side effects from medications.  Patient is eager to get home and ready to get out of the hospital..   Patient reports he will follow up at the [place] in [place] for his mental health hx (PTSD), medications, and counseling.  CSW offered local support and resources, but patient declined.  Patient does not want to go to skilled nursing facility, he wants to go home with his wife at dc and have home health arranged.  Wife is agreeable.  No current needs at this time.  CSW with psych service will sign off at this time,  Ashley Jacobs, MSW LCSWA [phone]

## 2011-09-01 NOTE — Progress Notes (Signed)
SUBJECTIVE: feels better  OBJECTIVE:   Vitals:   Filed Vitals:   08/31/11 1407 08/31/11 1447 08/31/11 2129 09/01/11 0300  BP:  112/74 120/77 128/81  Pulse: 92 91 74 78  Temp:  98.6 F (37 C) 99.2 F (37.3 C) 98.8 F (37.1 C)  TempSrc:  Oral Oral   Resp: 22 20 20 20   Height:  5\' 11"  (1.803 m)    Weight:  98.1 kg (216 lb 4.3 oz)  100.2 kg (220 lb 14.4 oz)  SpO2: 97% 95% 96% 98%   I&O's:   Intake/Output Summary (Last 24 hours) at 09/01/11 1100 Last data filed at 09/01/11 0839  Gross per 24 hour  Intake   1200 ml  Output    252 ml  Net    948 ml   TELEMETRY: Reviewed telemetry pt in NSR:     PHYSICAL EXAM General: Well developed, well nourished, in no acute distress Head: Eyes PERRLA, No xanthomas.   Normal cephalic and atramatic  Lungs:  Clear bilaterally to auscultation and percussion. Heart:   HRRR S1 S2 Pulses are 2+ & equal.            No carotid bruit. No JVD.  No abdominal bruits. No femoral bruits. Abdomen: Bowel sounds are positive, abdomen soft and non-tender without masses or                  Hernia's noted. Msk:  Back normal, normal gait. Normal strength and tone for age. Extremities: No clubbing, cyanosis or edema.  DP +1 Neuro: Alert and oriented X 3. Psych:  Good affect, responds appropriately   LABS: Basic Metabolic Panel:  Basename 09/01/11 0525 08/31/11 0548  NA 138 139  K 3.6 2.8*  CL 104 102  CO2 23 24  GLUCOSE 89 137*  BUN 9 11  CREATININE 0.86 0.89  CALCIUM 9.4 9.1  MG 1.9 --  PHOS -- --   Liver Function Tests:  [facility] 08/31/11 0548  AST 34  ALT 19  ALKPHOS 111  BILITOT 0.5  PROT 6.2  ALBUMIN 2.6*   No results found for this basename: LIPASE:2,AMYLASE:2 in the last 72 hours CBC:  Basename 08/31/11 0954  WBC 7.8  NEUTROABS --  HGB 13.6  HCT 39.4  MCV 86.8  PLT 158   Cardiac Enzymes: No results found for this basename: CKTOTAL:3,CKMB:3,CKMBINDEX:3,TROPONINI:3 in the last 72 hours BNP: No results found for this  basename: POCBNP:3 in the last 72 hours D-Dimer: No results found for this basename: DDIMER:2 in the last 72 hours Hemoglobin A1C: No results found for this basename: HGBA1C in the last 72 hours Fasting Lipid Panel: No results found for this basename: CHOL,HDL,LDLCALC,TRIG,CHOLHDL,LDLDIRECT in the last 72 hours Thyroid Function Tests: No results found for this basename: TSH,T4TOTAL,FREET3,T3FREE,THYROIDAB in the last 72 hours Anemia Panel: No results found for this basename: VITAMINB12,FOLATE,FERRITIN,TIBC,IRON,RETICCTPCT in the last 72 hours Coag Panel:   Lab Results  Component Value Date   INR 0.99 08/24/2011   INR 0.99 08/23/2011    RADIOLOGY: Ct Head Wo Contrast  08/24/2011  *RADIOLOGY REPORT*  Clinical Data: Seizure.  Cardiac arrest.  Evaluate for infarct.  CT HEAD WITHOUT CONTRAST  Technique:  Contiguous axial images were obtained from the base of the skull through the vertex without contrast.  Comparison: None.  Findings: There is no evidence for acute hemorrhage, hydrocephalus, mass lesion, or abnormal extra-axial fluid collection.  No definite CT evidence for acute infarction.  The visualized paranasal sinuses and mastoid air cells are  clear.  IMPRESSION: Normal exam.  Original Report Authenticated By: ERIC A. MANSELL, M.D.   [name] Wo Contrast  08/27/2011  *RADIOLOGY REPORT*  Clinical Data: Seizure.  The patient recently had a large dose of antihistamines.  MRI HEAD WITHOUT AND WITH CONTRAST  Technique:  Multiplanar, multiecho pulse sequences of the brain and surrounding structures were obtained according to standard protocol without and with intravenous contrast  Contrast: 20mL MULTIHANCE GADOBENATE DIMEGLUMINE 529 MG/ML IV SOLN  Comparison: CT head without contrast 08/24/2011.  Findings: No acute infarct, hemorrhage, mass lesion is present. Mild periventricular white matter signal is noted.  Three or four punctate subcortical hyperdensities are seen as well.  There is no  enhancement associated with these lesions.  There is an area of linear enhancement involving the right parietal lobe, compatible with a benign venous angioma.  No other pathologic enhancement is evident.  Flow is present in the major intracranial arteries.  The globes orbits are intact.  The paranasal sinuses are clear.  Fluid is present in the mastoid air cells bilaterally.  No obstructing nasopharyngeal lesion is evident.  Dedicated imaging of the hippocampal structures demonstrates symmetric size and signal.  IMPRESSION:  1.  No acute or focal abnormality to explain seizures. 2.  Minimal white matter changes slightly advanced for age. The finding is nonspecific but can be seen in the setting of chronic microvascular ischemia, a demyelinating process such as multiple sclerosis, vasculitis, complicated migraine headaches, or as the sequelae of a prior infectious or inflammatory process. 3.  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 4.  Bilateral mastoid effusions are likely related to the patient's intubated status.  Original Report Authenticated By: Jamesetta Orleans. MATTERN, M.D.        ASSESSMENT: prolonged QT from meds.  No CAD. PLAN: No further arrhythmias.  Continue metoprolol.  WIll f/u with him in the office.  QT decreased to 472.  Still with biphasic T waves.  Ivan Maskell S.  09/01/2011  11:00 AM

## 2011-09-01 NOTE — Progress Notes (Signed)
Marylee Floras RN

## 2011-09-01 NOTE — Progress Notes (Signed)
Patient stable upon discharge. Patient and wife given discharge instructions and new prescriptions. Patient left hospital via a wheelchair. Nurse tech brought patient out to front of hospital. No equipment prescribed.

## 2011-09-01 NOTE — Progress Notes (Signed)
Nutrition Follow Up  Patient extubated 11/10. Plan to discharge today.   Diet: Low sodium, Heart Healthy. Patient is eating 100% and tolerating well. Patient with questions regarding a Low Sodium diet after discharge. He reports he has not followed a modified diet in the past.   Meds and labs reviewed.   Nutrition Dx: Inadequate oral intake, resolved.  New nutrition Dx: Food and nutrition related knowledge deficit r/t low sodium diet AEB patient requesting information on low sodium diet.   Goal: Initiate TF, met, patient no longer receiving TF New goal: Patient will verbalize understanding of low sodium diet, met  Intervention: Discussed with the patient strategies to reduce sodium in the diet, including label reading, sodium-free cooking, and dining out. Educational handout provided.     Dietitian [phone]

## 2011-09-01 NOTE — Discharge Summary (Signed)
HOSPITAL DISCHARGE SUMMARY  Ross [name]  MRN: [id number]  DOB:[birth year]  Date of Admission: 08/23/2011 Date of Discharge: 09/01/2011         LOS: 9 days   Attending Physician:Ross [name]  Patient's PCP: Ross [name]  Consults: Treatment Team:  Ross Roof, MD  Discharge Diagnoses: Present on Admission:  .Seizure .Poisoning by anticholinergic drug .Papular rash, generalized .HTN (hypertension), malignant .Hyperglycemia .Thrombocytopenia .Cardiac arrest .QT prolongation .Acute respiratory failure .Acute encephalopathy   Current Discharge Medication List    START taking these medications   Details  lisinopril (PRINIVIL,ZESTRIL) 5 MG tablet Take 1 tablet (5 mg total) by mouth daily. Qty: 30 tablet, Refills: 0    magnesium oxide (MAG-OX) 400 MG tablet Take 1 tablet (400 mg total) by mouth 2 (two) times daily. Qty: 30 tablet, Refills: 0    metoprolol tartrate (LOPRESSOR) 25 MG tablet Take 1 tablet (25 mg total) by mouth 2 (two) times daily. Qty: 60 tablet, Refills: 0    phenytoin (DILANTIN) 300 MG ER capsule Take 1 capsule (300 mg total) by mouth daily. Qty: 30 capsule, Refills: 0      CONTINUE these medications which have NOT CHANGED   Details  cetaphil (CETAPHIL) cream Apply topically as needed. Apply after bathing       STOP taking these medications     diphenhydrAMINE (BENADRYL) 25 MG tablet      gentamicin (GARAMYCIN) 0.1 % ointment      hydrOXYzine (ATARAX/VISTARIL) 25 MG tablet      loratadine (CLARITIN) 10 MG tablet           Brief Admission History: Ross [name] is [name] 45 year old male with [name] past medical history of eczema. He has seen [name] dermatologist for this in Ross past. Ross patient has developed [name] contact dermatitis triggered by changing soap. He subsequently developed [name] diffuse body rash and his [name] physician prescribed him Vistaril. Ross patient has taken 10 (ten) 25 mg capsules of Vistaril over Ross  past 24 hours and also has taken [name] bottle of over-Ross-counter liquid Benadryl. He subsequently developed tremulousness and muscle jerks. His wife brought him to Ross hospital for evaluation where he was observed to have [name] tonic-clonic seizure. Ross patient subsequently was referred to Ross hospitalist service for further evaluation and observation.  Hospital Course: Present on Admission:  .Seizure .Poisoning by anticholinergic drug .Papular rash, generalized .HTN (hypertension), malignant .Hyperglycemia .Thrombocytopenia .Cardiac arrest .QT prolongation .Acute respiratory failure .Acute encephalopathy:  1. Cardiopulmonary arrest: As mentioned above patient did have anti-cholinergic medication intoxication with Benadryl and Vistaril. Patient developed muscle jerks and tremulousness and ended up having of V. tach and ventricular fibrillation. Patient admitted to Ross ICU intubated on Ross 6 tell Ross 10th he was on Ross mechanical ventilation. Patient was seen by cardiology Ross  VT/V. fib is likely secondary to QT prolongation from Ross anti-cholinergic medications patient seen by [name] general cardiology, and EP cardiologist and they did recommend no AICD, recommend to keep potassium more than 3.9 and magnesium above 2. New  2. Seizures: As mentioned above patient developed these seizures Ross jerking movements and none of these tremulousness before Ross cardiac arrest. After patient developed ventilator dependent respiratory failure and was stable EEG was done which basically showed no epileptiform waves. Patient seen by neurologist Dr. Lyman [name] and he recommended Keppra initially but Ross patient did have [name] low lately it's been he recommended to continue on Dilantin for one month and followup with Ross [name] for neurological  Associates to see if Ross patient can get off of Ross Dilantin.  3. Acute ventilator dependent respiratory failure: This is secondary to Ross cardiopulmonary arrest patient did have patient was  intubated and mechanically ventilated from November 6th to Ross 10th.  4. Acute encephalopathy: Patient developed acute behavior changes with agitation and combativeness. Patient does have history of PTSD for which she is following with Ross [name]. It was thought initially this is secondary to anoxic brain injury caused by Ross cardiopulmonary arrest. But patient regained full alertness and orientation in 2 days before discharge. Patient became alert awake oriented x3. He was evaluated by psychiatrist for his combative behavior [name]. start him on Seroquel and Klonopin, which was both discontinued secondary to Seroquel side effects which can cause QT prolongation. Patient to followup with his primary psychiatrist for his PTSD medications.  5. Thrombocytopenia: This is resolved, it is likely secondary to acute illness.  6. Papular rash: Patient should followup with his dermatologist patient does have contact dermatitis for you but she said that he was using Vistaril and cream this is resolved completely for now.  Day of Discharge BP 128/81  Pulse 78  Temp(Src) 98.8 F (37.1 C) (Oral)  Resp 20  Ht 5\' 11"  (1.803 m)  Wt 100.2 kg (220 lb 14.4 oz)  BMI 30.81 kg/m2  SpO2 98% Physical Exam: GEN: No acute distress, cooperative with exam PSYCH: He is alert and oriented x4; does not appear anxious does not appear depressed; affect is normal  HEENT: Mucous membranes pink and anicteric;  Mouth: without oral thrush or lesions Eyes: PERRLA; EOM intact;  Neck: no cervical lymphadenopathy nor thyromegaly or carotid bruit; no JVD;  CHEST WALL: No tenderness, symmetrical to breathing bilaterally CHEST: Normal respiration, clear to auscultation bilaterally  HEART: Regular rate and rhythm; no murmurs, rubs or gallops, S1 and S2 heard  BACK: No kyphosis or scoliosis; no CVA tenderness  ABDOMEN:  soft non-tender; no masses, no organomegaly, normal abdominal bowel sounds; no pannus; no intertriginous candida.    EXTREMITIES: No bone or joint deformity; no edema; no ulcerations.  PULSES: 2+ and symmetric, neurovascularity is intact SKIN: Normal hydration no rash or ulceration, no flushing or suspicious lesions  CNS: Cranial nerves 2-12 grossly intact no focal neurologic deficit, coordination is intact gait not tested    Results for orders placed during Ross hospital encounter of 08/23/11 (from Ross past 24 hour(s))  URINALYSIS, ROUTINE W REFLEX MICROSCOPIC     Status: Abnormal   Collection Time   08/31/11 11:04 AM      Component Value Range   Color, Urine AMBER (*) YELLOW    Appearance CLOUDY (*) CLEAR    Specific Gravity, Urine 1.028  1.005 - 1.030    pH 5.0  5.0 - 8.0    Glucose, UA NEGATIVE  NEGATIVE (mg/dL)   Hgb urine dipstick LARGE (*) NEGATIVE    Bilirubin Urine SMALL (*) NEGATIVE    Ketones, ur 15 (*) NEGATIVE (mg/dL)   Protein, ur 30 (*) NEGATIVE (mg/dL)   Urobilinogen, UA 0.2  0.0 - 1.0 (mg/dL)   Nitrite NEGATIVE  NEGATIVE    Leukocytes, UA NEGATIVE  NEGATIVE   URINE MICROSCOPIC-ADD ON     Status: Abnormal   Collection Time   08/31/11 11:04 AM      Component Value Range   Squamous Epithelial / LPF RARE  RARE    WBC, UA 0-2  <3 (WBC/hpf)   RBC / HPF TOO NUMEROUS TO COUNT  <3 (  RBC/hpf)   Bacteria, UA FEW (*) RARE   BASIC METABOLIC PANEL     Status: Normal   Collection Time   09/01/11  5:25 AM      Component Value Range   Sodium 138  135 - 145 (mEq/L)   Potassium 3.6  3.5 - 5.1 (mEq/L)   Chloride 104  96 - 112 (mEq/L)   CO2 23  19 - 32 (mEq/L)   Glucose, Bld 89  70 - 99 (mg/dL)   BUN 9  6 - 23 (mg/dL)   Creatinine, Ser 1.61  0.50 - 1.35 (mg/dL)   Calcium 9.4  8.4 - 09.6 (mg/dL)   GFR calc non Af Amer >90  >90 (mL/min)   GFR calc Af Amer >90  >90 (mL/min)  MAGNESIUM     Status: Normal   Collection Time   09/01/11  5:25 AM      Component Value Range   Magnesium 1.9  1.5 - 2.5 (mg/dL)    Disposition: Home   Follow-up Appts: Discharge Orders    Future Orders Please  Complete By Expires   Diet - low sodium heart healthy      Increase activity slowly      Home Health      Questions: Responses:   To provide Ross following care/treatments PT      Follow-up Information    Follow up with [facility] in 2 weeks.      Follow up with VARANASI,JAYADEEP S. in 2 weeks.   Contact information:   [address] [employer] [address] [name]. [place] [address] [phone]       Follow up with [facility]. Make an appointment in 1 month.   Contact information:   [facility] [address] [place], [place] [address] Tel: [phone] Fax: [phone]            Signed: Clint Lipps 09/01/2011, 10:35 AM

## 2011-09-01 NOTE — Progress Notes (Signed)
Patient politely declines OT services today.  He is d/c'ing home today and "feels good" about ADLs.    Thank you,  09/01/2011 Merilee Wible K. Beatrice-Mitchell, MS, OTR/L Occupational Therapist Acute Rehabilitation [facility] Phone: [phone] Pager: [phone] [email or web address]

## 2011-09-03 ENCOUNTER — Inpatient Hospital Stay (HOSPITAL_COMMUNITY)
Admission: EM | Admit: 2011-09-03 | Discharge: 2011-09-06 | DRG: 438 | Disposition: A | Discharge status: Home / Self Care | Payer: Non-veteran care | Source: Ambulatory Visit | Attending: Internal Medicine | Admitting: Internal Medicine

## 2011-09-03 ENCOUNTER — Inpatient Hospital Stay (HOSPITAL_COMMUNITY): Payer: Non-veteran care

## 2011-09-03 ENCOUNTER — Emergency Department (HOSPITAL_COMMUNITY): Payer: Non-veteran care

## 2011-09-03 ENCOUNTER — Encounter (HOSPITAL_COMMUNITY): Payer: Self-pay | Admitting: Emergency Medicine

## 2011-09-03 DIAGNOSIS — I10 Essential (primary) hypertension: Secondary | ICD-10-CM | POA: Diagnosis present

## 2011-09-03 DIAGNOSIS — R319 Hematuria, unspecified: Secondary | ICD-10-CM | POA: Diagnosis not present

## 2011-09-03 DIAGNOSIS — R509 Fever, unspecified: Secondary | ICD-10-CM | POA: Diagnosis present

## 2011-09-03 DIAGNOSIS — K859 Acute pancreatitis without necrosis or infection, unspecified: Principal | ICD-10-CM | POA: Diagnosis present

## 2011-09-03 DIAGNOSIS — F4310 Post-traumatic stress disorder, unspecified: Secondary | ICD-10-CM | POA: Diagnosis present

## 2011-09-03 DIAGNOSIS — G40909 Epilepsy, unspecified, not intractable, without status epilepticus: Secondary | ICD-10-CM | POA: Diagnosis present

## 2011-09-03 DIAGNOSIS — Z79899 Other long term (current) drug therapy: Secondary | ICD-10-CM

## 2011-09-03 DIAGNOSIS — E871 Hypo-osmolality and hyponatremia: Secondary | ICD-10-CM | POA: Diagnosis not present

## 2011-09-03 DIAGNOSIS — R109 Unspecified abdominal pain: Secondary | ICD-10-CM | POA: Diagnosis present

## 2011-09-03 DIAGNOSIS — R569 Unspecified convulsions: Secondary | ICD-10-CM | POA: Diagnosis present

## 2011-09-03 DIAGNOSIS — Z8674 Personal history of sudden cardiac arrest: Secondary | ICD-10-CM

## 2011-09-03 DIAGNOSIS — J189 Pneumonia, unspecified organism: Secondary | ICD-10-CM | POA: Diagnosis present

## 2011-09-03 DIAGNOSIS — E876 Hypokalemia: Secondary | ICD-10-CM | POA: Diagnosis not present

## 2011-09-03 HISTORY — DX: Pneumonia, unspecified organism: J18.9

## 2011-09-03 LAB — CBC
HCT: 36.7 % — ABNORMAL LOW (ref 39.0–52.0)
Hemoglobin: 11.9 g/dL — ABNORMAL LOW (ref 13.0–17.0)
MCH: 28.7 pg (ref 26.0–34.0)
MCHC: 32.4 g/dL (ref 30.0–36.0)
MCV: 88.4 fL (ref 78.0–100.0)
Platelets: 215 10*3/uL (ref 150–400)
RBC: 4.15 MIL/uL — ABNORMAL LOW (ref 4.22–5.81)
RDW: 14.2 % (ref 11.5–15.5)
WBC: 12 K/uL — ABNORMAL HIGH (ref 4.0–10.5)

## 2011-09-03 LAB — URINALYSIS, ROUTINE W REFLEX MICROSCOPIC
Bilirubin Urine: NEGATIVE
Glucose, UA: NEGATIVE mg/dL
Ketones, ur: 15 mg/dL — AB
Leukocytes, UA: NEGATIVE
Nitrite: NEGATIVE
Protein, ur: 100 mg/dL — AB
Specific Gravity, Urine: 1.026 (ref 1.005–1.030)
Urobilinogen, UA: 0.2 mg/dL (ref 0.0–1.0)
pH: 5 (ref 5.0–8.0)

## 2011-09-03 LAB — DIFFERENTIAL
Basophils Absolute: 0 10*3/uL (ref 0.0–0.1)
Basophils Relative: 0 % (ref 0–1)
Eosinophils Absolute: 0.1 10*3/uL (ref 0.0–0.7)
Eosinophils Relative: 1 % (ref 0–5)
Lymphocytes Relative: 9 % — ABNORMAL LOW (ref 12–46)
Lymphs Abs: 1.1 K/uL (ref 0.7–4.0)
Monocytes Absolute: 1 K/uL (ref 0.1–1.0)
Monocytes Relative: 8 % (ref 3–12)
Neutro Abs: 9.8 K/uL — ABNORMAL HIGH (ref 1.7–7.7)
Neutrophils Relative %: 82 % — ABNORMAL HIGH (ref 43–77)

## 2011-09-03 LAB — COMPREHENSIVE METABOLIC PANEL
ALT: 22 U/L (ref 0–53)
AST: 31 U/L (ref 0–37)
Albumin: 3.1 g/dL — ABNORMAL LOW (ref 3.5–5.2)
Calcium: 9.6 mg/dL (ref 8.4–10.5)
GFR calc Af Amer: >90 mL/min (ref >90)
Glucose, Bld: 122 mg/dL — ABNORMAL HIGH (ref 70–99)
Sodium: 134 mEq/L — ABNORMAL LOW (ref 135–145)
Total Protein: 7.8 g/dL (ref 6.0–8.3)

## 2011-09-03 LAB — COMPREHENSIVE METABOLIC PANEL WITH GFR
Alkaline Phosphatase: 122 U/L — ABNORMAL HIGH (ref 39–117)
BUN: 7 mg/dL (ref 6–23)
CO2: 23 meq/L (ref 19–32)
Chloride: 100 meq/L (ref 96–112)
Creatinine, Ser: 0.9 mg/dL (ref 0.50–1.35)
GFR calc non Af Amer: >90 mL/min (ref >90)
Potassium: 3.6 meq/L (ref 3.5–5.1)
Total Bilirubin: 0.4 mg/dL (ref 0.3–1.2)

## 2011-09-03 LAB — LIPASE, BLOOD: Lipase: 531 U/L — ABNORMAL HIGH (ref 11–59)

## 2011-09-03 LAB — PROCALCITONIN: Procalcitonin: 0.16 ng/mL

## 2011-09-03 LAB — URINE MICROSCOPIC-ADD ON

## 2011-09-03 LAB — LACTIC ACID, PLASMA: Lactic Acid, Venous: 0.9 mmol/L (ref 0.5–2.2)

## 2011-09-03 MED ORDER — HYDROMORPHONE HCL PF 1 MG/ML IJ SOLN
INTRAMUSCULAR | Status: AC
  Filled 2011-09-03: qty 1

## 2011-09-03 MED ORDER — IPRATROPIUM BROMIDE 0.02 % IN SOLN
0.5000 mg | Freq: Four times a day (QID) | RESPIRATORY_TRACT | Status: DC
  Administered 2011-09-03 – 2011-09-04 (×4): 0.5 mg via RESPIRATORY_TRACT
  Filled 2011-09-03 (×4): qty 2.5

## 2011-09-03 MED ORDER — IOHEXOL 300 MG/ML  SOLN
120.0000 mL | Freq: Once | INTRAMUSCULAR | Status: AC | PRN
  Administered 2011-09-03: 120 mL via INTRAVENOUS

## 2011-09-03 MED ORDER — ENOXAPARIN SODIUM 40 MG/0.4ML ~~LOC~~ SOLN
40.0000 mg | SUBCUTANEOUS | Status: DC
  Administered 2011-09-03 – 2011-09-05 (×3): 40 mg via SUBCUTANEOUS
  Filled 2011-09-03 (×4): qty 0.4

## 2011-09-03 MED ORDER — ACETAMINOPHEN 325 MG PO TABS
650.0000 mg | ORAL_TABLET | Freq: Four times a day (QID) | ORAL | Status: DC | PRN
  Administered 2011-09-03 – 2011-09-05 (×2): 650 mg via ORAL
  Filled 2011-09-03 (×2): qty 2

## 2011-09-03 MED ORDER — ACETAMINOPHEN 650 MG RE SUPP: 650.0000 mg | Freq: Four times a day (QID) | RECTAL | Status: DC | PRN

## 2011-09-03 MED ORDER — LISINOPRIL 5 MG PO TABS
5.0000 mg | ORAL_TABLET | Freq: Every day | ORAL | Status: DC
  Administered 2011-09-04: 5 mg via ORAL
  Filled 2011-09-03 (×3): qty 1

## 2011-09-03 MED ORDER — GUAIFENESIN ER 600 MG PO TB12
600.0000 mg | ORAL_TABLET | Freq: Two times a day (BID) | ORAL | Status: DC
  Administered 2011-09-03 – 2011-09-06 (×7): 600 mg via ORAL
  Filled 2011-09-03 (×8): qty 1

## 2011-09-03 MED ORDER — DEXTROSE 5 % IV SOLN
1.0000 g | INTRAVENOUS | Status: DC
  Administered 2011-09-03 – 2011-09-05 (×3): 1 g via INTRAVENOUS
  Filled 2011-09-03 (×4): qty 10

## 2011-09-03 MED ORDER — PHENYTOIN SODIUM EXTENDED 100 MG PO CAPS
300.0000 mg | ORAL_CAPSULE | Freq: Every day | ORAL | Status: DC
  Administered 2011-09-03: 200 mg via ORAL
  Administered 2011-09-04 – 2011-09-06 (×3): 300 mg via ORAL
  Filled 2011-09-03 (×4): qty 3

## 2011-09-03 MED ORDER — MAGNESIUM OXIDE 400 MG PO TABS
400.0000 mg | ORAL_TABLET | Freq: Two times a day (BID) | ORAL | Status: DC
  Administered 2011-09-03 – 2011-09-06 (×6): 400 mg via ORAL
  Filled 2011-09-03 (×8): qty 1

## 2011-09-03 MED ORDER — HYDROMORPHONE HCL PF 1 MG/ML IJ SOLN
1.0000 mg | Freq: Once | INTRAMUSCULAR | Status: AC
  Administered 2011-09-03: 1 mg via INTRAVENOUS
  Filled 2011-09-03: qty 1

## 2011-09-03 MED ORDER — ONDANSETRON HCL 4 MG/2ML IJ SOLN
4.0000 mg | Freq: Once | INTRAMUSCULAR | Status: AC
  Administered 2011-09-03: 4 mg via INTRAVENOUS
  Filled 2011-09-03: qty 2

## 2011-09-03 MED ORDER — OXYCODONE HCL 5 MG PO TABS
5.0000 mg | ORAL_TABLET | ORAL | Status: DC | PRN
  Administered 2011-09-04 – 2011-09-05 (×2): 5 mg via ORAL
  Filled 2011-09-03 (×2): qty 1

## 2011-09-03 MED ORDER — KCL IN DEXTROSE-NACL 20-5-0.9 MEQ/L-%-% IV SOLN
INTRAVENOUS | Status: DC
  Administered 2011-09-03 – 2011-09-05 (×4): via INTRAVENOUS
  Filled 2011-09-03 (×7): qty 1000

## 2011-09-03 MED ORDER — DEXTROSE 5 % IV SOLN
500.0000 mg | INTRAVENOUS | Status: DC
  Administered 2011-09-03 – 2011-09-05 (×3): 500 mg via INTRAVENOUS
  Filled 2011-09-03 (×4): qty 500

## 2011-09-03 MED ORDER — SODIUM CHLORIDE 0.9 % IV SOLN
Freq: Once | INTRAVENOUS | Status: AC
  Administered 2011-09-03: 07:00:00 via INTRAVENOUS

## 2011-09-03 MED ORDER — HYDROMORPHONE HCL PF 1 MG/ML IJ SOLN
1.0000 mg | INTRAMUSCULAR | Status: DC | PRN
  Administered 2011-09-03: 1 mg via INTRAVENOUS
  Administered 2011-09-03: 07:00:00 via INTRAVENOUS
  Administered 2011-09-04 (×4): 1 mg via INTRAVENOUS
  Filled 2011-09-03 (×5): qty 1

## 2011-09-03 MED ORDER — HYDROMORPHONE HCL PF 1 MG/ML IJ SOLN: 1.0000 mg | Freq: Once | INTRAMUSCULAR | Status: DC

## 2011-09-03 MED ORDER — METOPROLOL TARTRATE 25 MG PO TABS
25.0000 mg | ORAL_TABLET | Freq: Two times a day (BID) | ORAL | Status: DC
  Administered 2011-09-03 – 2011-09-06 (×7): 25 mg via ORAL
  Filled 2011-09-03 (×8): qty 1

## 2011-09-03 NOTE — ED Notes (Signed)
Pt states no chest pain--only in the right mid-abdomen. Denies N/V/D. Heart tests all normal.

## 2011-09-03 NOTE — ED Notes (Signed)
Pt was having a pain in left side and I let RN Kescha know.7:56am. Ross [name]

## 2011-09-03 NOTE — ED Notes (Signed)
Family at bedside. Received pt through change of shift report. Pt reports pain 10/10 at present. Pt medicated as ordered. Pt offered warm blanket.

## 2011-09-03 NOTE — H&P (Signed)
Hospital Admission Note Date: 09/03/2011  Patient name: Ross [name]           Medical record number: [id number] Date of birth: 1965/12/07           Age: 45 y.o.   Gender: male    PCP:  [facility] [place] Chief Complaint:  Abdominal pain  HPI: Ross [name] is a 45 y.o. male with history of a hypertension. Patient was discharged recently from the hospital 09/01/2011 after he was admitted for of cardiopulmonary arrest secondary to QT prolongation resulted from anticholinergic toxicity, patient did have ventilator dependent respiratory failure, acute encephalopathy and seizure as well. Patient was discharged on the 15th he was doing fine without any complaints he said he went home and he was eating and drinking without any problems this morning about 3 AM he developed severe epigastric abdominal pain. He rated the pain as a 9.5/10 the pain does not have radiation, sharp, does not remember anything increases or decreases the pain so patient came in straight to the ER for further evaluation. Upon initial evaluation in the emergency department patient was found to have lipase level of 531, abdominal ultrasound was normal, patient admitted for further evaluation.  Past Medical History: Past Medical History  Diagnosis Date  . Sleep apnea     had surgery to correct it  . Mental disorder     PTSD  . Seizures     currently dx with seizures  . Cardiac arrest 08/23/2011   Past Surgical History  Procedure Date  . Tonsillectomy   . Uvulopalatopharyngoplasty, tonsillectomy and septoplasty 06/16/2000    Medications: Prior to Admission medications   Medication Sig Start Date End Date Taking? Authorizing Provider  cetaphil (CETAPHIL) cream Apply 1 application topically daily as needed. Apply after bathing   Yes Historical Provider, MD  lisinopril (PRINIVIL,ZESTRIL) 5 MG tablet Take 1 tablet (5 mg total) by mouth daily. 09/01/11 08/31/12 Yes Abdulai Blaylock A [place]  magnesium oxide (MAG-OX)  400 MG tablet Take 1 tablet (400 mg total) by mouth 2 (two) times daily. 09/01/11 08/31/12 Yes Annalisia Ingber A [place]ere [place]  metoprolol tartrate (LOPRESSOR) 25 MG tablet Take 1 tablet (25 mg total) by mouth 2 (two) times daily. 09/01/11 08/31/12 Yes Braylon Lemmons A Jissell [place]  phenytoin (DILANTIN) 300 MG ER capsule Take 1 capsule (300 mg total) by mouth daily. 09/01/11 08/31/12 Yes Tranae Laramie A Titus [place]    Allergies:  No Known Allergies  Social History:  reports that he has never smoked. He quit smokeless tobacco use about 3 years ago. He reports that he drinks about .5 ounces of alcohol per week. He reports that he does not use illicit drugs.  Family History: Family History  Problem Relation Age of Onset  . Heart attack Mother     Review of Systems:  Constitutional: negative for anorexia, fevers and sweats Eyes: negative for irritation, redness and visual disturbance Ears, nose, mouth, throat, and face: negative for earaches, epistaxis, nasal congestion and sore throat Respiratory: negative for cough, dyspnea on exertion, sputum and wheezing Cardiovascular: negative for chest pain, dyspnea, lower extremity edema, orthopnea, palpitations and syncope Gastrointestinal: negative for abdominal pain, constipation, diarrhea, melena, nausea and vomiting Genitourinary:negative for dysuria, frequency and hematuria Hematologic/lymphatic: negative for bleeding, easy bruising and lymphadenopathy Musculoskeletal:negative for arthralgias, muscle weakness and stiff joints Neurological: negative for coordination problems, gait problems, headaches and weakness Endocrine: negative for diabetic symptoms including polydipsia, polyuria and weight loss Allergic/Immunologic: negative for anaphylaxis, hay fever and urticaria  Physical Exam: BP  111/89  Pulse 70  Temp(Src) 97.8 F (36.6 C) (Oral)  Resp 10  SpO2 94% General appearance: alert, cooperative and no distress  Head: Normocephalic, without obvious abnormality, atraumatic    Eyes: conjunctivae/corneas clear. PERRL, EOM's intact. Fundi benign.  Nose: Nares normal. Septum midline. Mucosa normal. No drainage or sinus tenderness.  Throat: lips, mucosa, and tongue normal; teeth and gums normal  Neck: Supple, no masses, no cervical lymphadenopathy, no JVD appreciated, no meningeal signs Resp: clear to auscultation bilaterally  Chest wall: no tenderness  Cardio: regular rate and rhythm, S1, S2 normal, no murmur, click, rub or gallop  GI: soft, mild epigastric tenderness; bowel sounds normal; no masses, no organomegaly  Extremities: extremities normal, atraumatic, no cyanosis or edema  Skin: Skin color, texture, turgor normal. No rashes or lesions  Neurologic: Alert and oriented X 3, normal strength and tone. Normal symmetric reflexes. Normal coordination and gait  Labs on Admission:   [facility] 09/03/11 0601 09/01/11 0525  NA 134* 138  K 3.6 3.6  CL 100 104  CO2 23 23  GLUCOSE 122* 89  BUN 7 9  CREATININE 0.90 0.86  CALCIUM 9.6 9.4  MG -- 1.9  PHOS -- --    Basename 09/03/11 0601  AST 31  ALT 22  ALKPHOS 122*  BILITOT 0.4  PROT 7.8  ALBUMIN 3.1*    Basename 09/03/11 0601  LIPASE 531*  AMYLASE --    Basename 09/03/11 0601 08/31/11 0954  WBC 12.0* 7.8  NEUTROABS 9.8* --  HGB 11.9* 13.6  HCT 36.7* 39.4  MCV 88.4 86.8  PLT 215 158   No results found for this basename: CKTOTAL:3,CKMB:3,CKMBINDEX:3,TROPONINI:3 in the last 72 hours No results found for this basename: TSH,T4TOTAL,FREET3,T3FREE,THYROIDAB in the last 72 hours No results found for this basename: VITAMINB12:2,FOLATE:2,FERRITIN:2,TIBC:2,IRON:2,RETICCTPCT:2 in the last 72 hours  Radiological Exams on Admission: US Abdomen Complete 09/03/2011  *RADIOLOGY REPORT*  IMPRESSION: Negative for gallstones or acute finding.  Fatty infiltration of the liver.   IMPRESSION: Present on Admission:  .Acute pancreatitis .Abdominal pain .HTN (hypertension),  malignant .Seizure  Assessment/Plan  1. Acute pancreatitis: Patient will be admitted to the hospital he will be n.p.o., CT scan of abdomen and pelvis will be done to rule out other intra-abdominal processes. Patient will be on IV fluids for aggressive IV hydration, IV narcotics will be started for of pain control as well as antiemetics. Ultrasound of abdomen showed no gallstones. Patient says he used to drink about half of a fifth of hard liquor (usually bourbon whiskey or vodka) 3-4 times a week, but last time he drank about 30 days ago.   2. Abdominal pain: This is secondary to acute pancreatitis we'll treat with IV narcotics hoping the n.p.o. status will also help with the pain.  3. Seizure: This was newly diagnosed last time he was in the hospital, patient is on Dilantin and that is being continued.  4. Hypertension: Patient is on lisinopril and metoprolol. Seems controlled now we'll continue his home medications.  5. Recent cardiopulmonary arrest: This was resulted from the TT prolongation secondary to anticholinergic medications toxicity, patient was evaluated by EP cardiologist at that time said there is no need for AICD, now continue his beta blockers make sure his potassium and magnesium in the high side.  Jacinta Penalver A 09/03/2011, 8:57 AM

## 2011-09-03 NOTE — ED Notes (Signed)
Pt out of room to radiology. 

## 2011-09-03 NOTE — ED Notes (Signed)
BP 116/77 with NS infusing--pt to [place] on stretcher now.

## 2011-09-03 NOTE — ED Notes (Signed)
Attempt to call report, nurse unavailable.

## 2011-09-03 NOTE — ED Notes (Signed)
Resting quietly in bed. Awaiting transport to floor. NAD noted.

## 2011-09-03 NOTE — ED Provider Notes (Cosign Needed)
History     CSN: [id number] Arrival date & time: 09/03/2011  4:53 AM   First MD Initiated Contact with Patient 09/03/11 0539      Chief Complaint  Patient presents with  . Abdominal Cramping    Pt just discharged on11-15 after cardiac arrest. Possible reaction to Benadryl he took for rash.    (Consider location/radiation/quality/duration/timing/severity/associated sxs/prior treatment) HPI Comments: Patient presents to 2 worsening right-sided abdominal pain that began more in the lower abdomen but is now settled into the upper abdomen radiates into his right breast area. Patient has significant history of prolonged hospitalization recently, was discharged just a few days ago after he had a seizure and cardiac arrest. Based on old records, it seems that the arrest was related either to the seizure and possibly hypoxia and respiratory arrest, or possibly related to a Benadryl overdose. Patient reports some nausea. He denies any current chest pain other than the pain radiating into the right breast from his abdomen. He denies any dysuria or hematuria. He denies prior history of similar. He denies any prior abdominal surgeries. Patient reports after discharged home he was feeling well without any significant symptoms other than mild fatigue.  Level 5 caveat due to condition of the patient and need for urgent treatment.    Patient is a 45 y.o. male presenting with cramps. The history is provided by the patient and a relative.  Abdominal Cramping    Past Medical History  Diagnosis Date  . Sleep apnea     had surgery to correct it  . Mental disorder     PTSD  . Seizures     currently dx with seizures  . Cardiac arrest 08/23/2011    Past Surgical History  Procedure Date  . Tonsillectomy   . Uvulopalatopharyngoplasty, tonsillectomy and septoplasty 06/16/2000    Family History  Problem Relation Age of Onset  . Heart attack Mother     History  Substance Use Topics  . Smoking status:  Never Smoker   . Smokeless tobacco: Former Neurosurgeon    Quit date: 08/22/2008  . Alcohol Use: 0.5 oz/week    1 drink(s) per week      Review of Systems  Unable to perform ROS: Other    Allergies  Review of patient's allergies indicates no known allergies.  Home Medications   Current Outpatient Rx  Name Route Sig Dispense Refill  . CETAPHIL EX CREA Topical Apply 1 application topically daily as needed. Apply after bathing    . LISINOPRIL 5 MG PO TABS Oral Take 1 tablet (5 mg total) by mouth daily. 30 tablet 0  . MAGNESIUM OXIDE 400 MG PO TABS Oral Take 1 tablet (400 mg total) by mouth 2 (two) times daily. 30 tablet 0  . METOPROLOL TARTRATE 25 MG PO TABS Oral Take 1 tablet (25 mg total) by mouth 2 (two) times daily. 60 tablet 0  . PHENYTOIN SODIUM EXTENDED 300 MG PO CAPS Oral Take 1 capsule (300 mg total) by mouth daily. 30 capsule 0    BP 130/74  Pulse 105  Temp(Src) 100 F (37.8 C) (Oral)  Resp 30  SpO2 97%  Physical Exam  Constitutional: He appears well-developed and well-nourished. He appears distressed.  HENT:  Head: Normocephalic and atraumatic.  Eyes: Pupils are equal, round, and reactive to light.  Cardiovascular: Normal rate.   Pulmonary/Chest: Effort normal and breath sounds normal. He has no wheezes. He has no rales.  Abdominal: Soft. Normal appearance. There is no hepatomegaly. There  is tenderness in the right upper quadrant. There is guarding and positive Murphy's sign. There is no rigidity, no rebound, no CVA tenderness and no tenderness at McBurney's point.  Neurological: He is alert. GCS eye subscore is 4. GCS verbal subscore is 5. GCS motor subscore is 6.  Skin: Skin is warm. Rash noted.    ED Course  Procedures (including critical care time)  Labs Reviewed  CBC - Abnormal; Notable for the following:    WBC 12.0 (*)    RBC 4.15 (*)    Hemoglobin 11.9 (*)    HCT 36.7 (*)    All other components within normal limits  DIFFERENTIAL - Abnormal; Notable for  the following:    Neutrophils Relative 82 (*)    Neutro Abs 9.8 (*)    Lymphocytes Relative 9 (*)    All other components within normal limits  COMPREHENSIVE METABOLIC PANEL - Abnormal; Notable for the following:    Sodium 134 (*)    Glucose, Bld 122 (*)    Albumin 3.1 (*)    Alkaline Phosphatase 122 (*)    All other components within normal limits  LIPASE, BLOOD - Abnormal; Notable for the following:    Lipase 531 (*)    All other components within normal limits  URINALYSIS, ROUTINE W REFLEX MICROSCOPIC - Abnormal; Notable for the following:    Color, Urine AMBER (*) BIOCHEMICALS MAY BE AFFECTED BY COLOR   Appearance CLOUDY (*)    Hgb urine dipstick LARGE (*)    Ketones, ur 15 (*)    Protein, ur 100 (*)    All other components within normal limits  URINE MICROSCOPIC-ADD ON   No results found.   No diagnosis found.   RA sat is 98% and normal.  MDM    I reviewed pt's recent ED notes and hospital charts.  No discharge summary is available.  Patient's exam suggests biliary colic. We'll get blood tests including LFTs lipase and CBC as well as give IV fluids and IV analgesics. Plan is to obtain a abdominal ultrasound for now. I do not suspect a cardiac problem at this point given his abdominal tenderness. Patient had a cardiac cath during this last hospitalization with his cardiac arrest.  7:23 AM Patient's labs revealed a mild leukocytosis of 12.0 which is nonspecific. Significant labs include a lipase of 531 suggesting patient's symptoms are due to acute pancreatitis. At this point I do not have a clear cut etiology. Given the patient's severity of pain he'll most likely require admission for bowel rest and analgesic treatment. Ultrasound of his abdomen has been done and results are currently pending. He is otherwise hemodynamically stable. Plan is to continue IV fluids, and worse at bowel rest by keeping him n.p.o. and redressing with analgesics as needed.   8:19 AM Spoke to  [facility] who will see and admit, team 4, tele bed.     Gavin Pound. Oletta Lamas, MD 09/03/11 1610

## 2011-09-03 NOTE — Progress Notes (Signed)
Pt's chest xray results text paged to Dr. Arthor Captain

## 2011-09-03 NOTE — ED Notes (Signed)
NOTE  OG tube, ETT both removed prior to discharge 09-01-11.

## 2011-09-04 ENCOUNTER — Encounter (HOSPITAL_COMMUNITY): Payer: Self-pay | Admitting: Internal Medicine

## 2011-09-04 DIAGNOSIS — R509 Fever, unspecified: Secondary | ICD-10-CM | POA: Diagnosis present

## 2011-09-04 DIAGNOSIS — J189 Pneumonia, unspecified organism: Secondary | ICD-10-CM

## 2011-09-04 HISTORY — DX: Pneumonia, unspecified organism: J18.9

## 2011-09-04 LAB — COMPREHENSIVE METABOLIC PANEL
AST: 26 U/L (ref 0–37)
Albumin: 2.5 g/dL — ABNORMAL LOW (ref 3.5–5.2)
BUN: 4 mg/dL — ABNORMAL LOW (ref 6–23)
Chloride: 100 mEq/L (ref 96–112)
Creatinine, Ser: 0.87 mg/dL (ref 0.50–1.35)
Total Protein: 6.7 g/dL (ref 6.0–8.3)

## 2011-09-04 LAB — CBC
HCT: 34.9 % — ABNORMAL LOW (ref 39.0–52.0)
Hemoglobin: 11.9 g/dL — ABNORMAL LOW (ref 13.0–17.0)
MCH: 30 pg (ref 26.0–34.0)
MCV: 87.9 fL (ref 78.0–100.0)
RBC: 3.97 MIL/uL — ABNORMAL LOW (ref 4.22–5.81)
WBC: 9 10*3/uL (ref 4.0–10.5)

## 2011-09-04 MED ORDER — POTASSIUM CHLORIDE CRYS ER 20 MEQ PO TBCR
40.0000 meq | EXTENDED_RELEASE_TABLET | Freq: Two times a day (BID) | ORAL | Status: AC
  Administered 2011-09-04 (×2): 40 meq via ORAL
  Filled 2011-09-04 (×2): qty 2

## 2011-09-04 NOTE — Plan of Care (Signed)
Problem: Phase II Progression Outcomes Goal: Discharge plan established Outcome: Completed/Met Date Met:  09/04/11 Pt. To return home with wife

## 2011-09-04 NOTE — Progress Notes (Signed)
DAILY PROGRESS NOTE                              [facility]  SUBJECTIVE: Patient feels okay denies any shortness of breath denies any cough or sputum production. His abdominal pain is getting better he we wants to eat  OBJECTIVE: BP 126/74  Pulse 89  Temp(Src) 100 F (37.8 C) (Oral)  Resp 18  Ht 5\' 11"  (1.803 m)  Wt 97.5 kg (214 lb 15.2 oz)  BMI 29.98 kg/m2  SpO2 94%  Intake/Output Summary (Last 24 hours) at 09/04/11 1402 Last data filed at 09/04/11 0857  Gross per 24 hour  Intake 1778.33 ml  Output   1000 ml  Net 778.33 ml                      Weight change:  Physical Exam: General: Alert and awake oriented x3 not in any acute distress. HEENT: anicteric sclera, pupils equal reactive to light and accommodation CVS: S1-S2 heard, no murmur rubs or gallops Chest: clear to auscultation bilaterally, no wheezing rales or rhonchi Abdomen:  normal bowel sounds, soft, nontender, nondistended, no organomegaly Neuro: Cranial nerves II-XII intact, no focal neurological deficits Extremities: no cyanosis, no clubbing or edema noted bilaterally   Lab Results:  [facility] 09/04/11 0735 09/03/11 0601  NA 135 134*  K 3.7 3.6  CL 100 100  CO2 23 23  GLUCOSE 100* 122*  BUN 4* 7  CREATININE 0.87 0.90  CALCIUM 9.1 9.6  MG -- --  PHOS -- --    Basename 09/04/11 0735 09/03/11 0601  AST 26 31  ALT 18 22  ALKPHOS 106 122*  BILITOT 0.4 0.4  PROT 6.7 7.8  ALBUMIN 2.5* 3.1*    Basename 09/04/11 0735 09/03/11 0601  LIPASE 143* 531*  AMYLASE -- --    Basename 09/04/11 0735 09/03/11 0601  WBC 9.0 12.0*  NEUTROABS -- 9.8*  HGB 11.9* 11.9*  HCT 34.9* 36.7*  MCV 87.9 88.4  PLT 230 215   US Abdomen Complete 09/03/2011  *RADIOLOGY REPORT*    IMPRESSION: Negative for gallstones or acute finding.  Fatty infiltration of the liver.   Ct Abdomen Pelvis W Contrast 09/03/2011  *RADIOLOGY REPORT*   IMPRESSION:  1.  Mild peripancreatic retroperitoneal  inflammatory/edematous changes. 2.  Bilateral lower lobe airspace consolidation, right worse than left, suggesting pneumonia. 3.  Small right pleural effusion.   Medications: Scheduled Meds:   . azithromycin  500 mg Intravenous Q24H  . cefTRIAXone (ROCEPHIN) IV  1 g Intravenous Q24H  . enoxaparin  40 mg Subcutaneous Q24H  . guaiFENesin  600 mg Oral BID  . ipratropium  0.5 mg Nebulization QID  . lisinopril  5 mg Oral Daily  . magnesium oxide  400 mg Oral BID  . metoprolol tartrate  25 mg Oral BID  . phenytoin  300 mg Oral Daily   Continuous Infusions:   . dextrose 5 % and 0.9 % NaCl with KCl 20 mEq/L 100 mL/hr at 09/04/11 1310   PRN Meds:.acetaminophen, acetaminophen, HYDROmorphone, oxyCODONE  ASSESSMENT & PLAN: Principal Problem:  *Acute pancreatitis Active Problems:  Pneumonia  Abdominal pain  Seizure  HTN (hypertension), malignant  Fever  1. Acute pancreatitis: Patient admitted to the hospital for further evaluation, he had was n.p.o. yesterday aggressive hydration with IV fluids were started. Pain control with IV narcotics. His pain improved since yesterday his lipase level is  137 today it was over 500 yesterday. The CT scan did not show any gallstones are biliary abnormalities. Patient said he was drinking 2-3 shots of vodka day but last time he drank was 30 days prior to admission. I will start patient to stay on clear liquids check his lipase level in the morning monitor him clinically if he has any pain or nausea after the clear liquids will put him n.p.o. again.  2. Pneumonia: Patient did not have a excessive shortness of breath, Cough or sputum production. But CT scan was done for the pancreatitis as well as chest x-ray showing bilateral pneumonia right worse than the left. Patient was recently intubated after cardiopulmonary arrest and this is might be secondary to aspiration. Patient is on azithromycin and Rocephin. He still has a low-grade fever. We'll continue the  antibiotics, mucolytics and oxygen as needed.  3. Seizures disorder: Patient is on Dilantin this is being continued to no seizure-like activity during this hospital stay.  4. Hypertension: Controlled his home medications being continued.   LOS: 1 day   Daymien Goth A 09/04/2011, 2:02 PM

## 2011-09-05 ENCOUNTER — Other Ambulatory Visit: Payer: Self-pay

## 2011-09-05 LAB — LIPID PANEL
HDL: 50 mg/dL (ref >39)
LDL Cholesterol: 183 mg/dL — ABNORMAL HIGH (ref 0–99)
Total CHOL/HDL Ratio: 5.2 RATIO
VLDL: 26 mg/dL (ref 0–40)

## 2011-09-05 LAB — BASIC METABOLIC PANEL
BUN: <3 mg/dL — ABNORMAL LOW (ref 6–23)
CO2: 19 mEq/L (ref 19–32)
Chloride: 101 mEq/L (ref 96–112)
Creatinine, Ser: 0.81 mg/dL (ref 0.50–1.35)
GFR calc Af Amer: >90 mL/min (ref >90)
Glucose, Bld: 121 mg/dL — ABNORMAL HIGH (ref 70–99)
Potassium: 4 mEq/L (ref 3.5–5.1)

## 2011-09-05 LAB — LIPASE, BLOOD: Lipase: 147 U/L — ABNORMAL HIGH (ref 11–59)

## 2011-09-05 MED ORDER — POTASSIUM CHLORIDE CRYS ER 20 MEQ PO TBCR
20.0000 meq | EXTENDED_RELEASE_TABLET | Freq: Every day | ORAL | Status: DC
  Administered 2011-09-05 – 2011-09-06 (×2): 20 meq via ORAL
  Filled 2011-09-05 (×2): qty 1

## 2011-09-05 NOTE — Progress Notes (Signed)
Addendum  Patient seen and examined, chart and data base reviewed.  I agree with the above assessment and plan  For full details please see Mrs. Algis Downs PA. Note.  Acute pancreatitis, likely secondary to alcohol abuse. And pneumonia. Improving advance diet as tolerated and continue antibiotics.  Omarian Jaquith A  09/05/2011, 12:57 PM

## 2011-09-05 NOTE — Progress Notes (Signed)
Subjective: No events overnight.  Pt. Developed fever with diaphoresis this morning.   Reports 4 loose stools (not diarrhea).  Objective:  Vital signs in last 24 hours:  Filed Vitals:   09/04/11 2106 09/04/11 2108 09/05/11 0508 09/05/11 0615  BP: 121/79 121/79 139/84   Pulse: 87 87 109   Temp:  98.8 F (37.1 C) 100.4 F (38 C) 99.8 F (37.7 C)  TempSrc:      Resp:  18 19   Height:      Weight:      SpO2:  96% 96%     Intake/Output from previous day:   Intake/Output Summary (Last 24 hours) at 09/05/11 1013 Last data filed at 09/05/11 0956  Gross per 24 hour  Intake   3270 ml  Output    200 ml  Net   3070 ml    Physical Exam: Patient is alert and oriented in no apparent distress Heart has a regular rate and rhythm without obvious murmurs rubs or gallops Lungs sound clear to auscultation without wheezes or crackles Abdomen soft nontender nondistended with good bowel sounds  Extremities show no swelling cyanosis or edema Psychiatric patient's alert oriented, demeanor is pleasant cooperative, grooming is good  Lab Results:  Basic Metabolic Panel:    Component Value Date/Time   NA 131* 09/05/2011 0535   K 4.0 09/05/2011 0535   CL 101 09/05/2011 0535   CO2 19 09/05/2011 0535   BUN <3* 09/05/2011 0535   CREATININE 0.81 09/05/2011 0535   GLUCOSE 121* 09/05/2011 0535   CALCIUM 9.0 09/05/2011 0535   CBC:    Component Value Date/Time   WBC 9.0 09/04/2011 0735   HGB 11.9* 09/04/2011 0735   HCT 34.9* 09/04/2011 0735   PLT 230 09/04/2011 0735   MCV 87.9 09/04/2011 0735   NEUTROABS 9.8* 09/03/2011 0601   LYMPHSABS 1.1 09/03/2011 0601   MONOABS 1.0 09/03/2011 0601   EOSABS 0.1 09/03/2011 0601   BASOSABS 0.0 09/03/2011 0601    No results found for this or any previous visit (from the past 240 hour(s)).  Studies/Results: Dg Chest 2 View  09/03/2011  *RADIOLOGY REPORT*  Clinical Data: Shortness of breath  CHEST - 2 VIEW  Comparison: 08/29/2011, CT same date   Findings: Patchy right lower lobe airspace disease is noted with trace right pleural effusion.  Heart size is normal. Minimal left lower lobe airspace opacity is again noted.  No acute osseous finding.  Retained contrast noted within partially visualized nondilated colon.  IMPRESSION: Right greater than left lower lobe airspace disease which could represent pneumonia, less likely focal atelectasis.  Original Report Authenticated By: Harrel Lemon, M.D.   Ct Abdomen Pelvis W Contrast  09/03/2011  *RADIOLOGY REPORT*  Clinical Data: Acute pancreatitis, right upper abdominal pain, hypertension  CT ABDOMEN AND PELVIS WITH CONTRAST  Technique:  Multidetector CT imaging of the abdomen and pelvis was performed following the standard protocol during bolus administration of intravenous contrast.  Contrast: OMNIPAQUE IOHEXOL 300 MG/ML IV SOLN  Comparison: Ultrasound from earlier the same day  Findings: There is a tiny right pleural effusion.  There is airspace consolidation posteriorly in both visualized lung bases, more extensive on the right than left.  Unremarkable liver, gallbladder, spleen and accessory splenule, adrenal glands, pancreas, kidneys, abdominal aorta. There are mild inflammatory changes in the retroperitoneum posterior to the pancreatic head and body.  Stomach and small bowel are nondilated. Normal appendix.  Colon is nondistended with a few scattered descending and sigmoid  diverticula; no adjacent inflammatory or edematous change.  Urinary bladder is incompletely distended.  Left pelvic phleboliths.  No ascites.  No free air.  Portal vein patent. No adenopathy localized. No hydronephrosis.  Degenerative disc disease L4-5 and L5-S1.  IMPRESSION:  1.  Mild peripancreatic retroperitoneal inflammatory/edematous changes. 2.  Bilateral lower lobe airspace consolidation, right worse than left, suggesting pneumonia. 3.  Small right pleural effusion.  Original Report Authenticated By: Osa Craver, M.D.    Medications: Scheduled Meds:   . azithromycin  500 mg Intravenous Q24H  . cefTRIAXone (ROCEPHIN) IV  1 g Intravenous Q24H  . enoxaparin  40 mg Subcutaneous Q24H  . guaiFENesin  600 mg Oral BID  . magnesium oxide  400 mg Oral BID  . metoprolol tartrate  25 mg Oral BID  . phenytoin  300 mg Oral Daily  . potassium chloride  20 mEq Oral Daily  . potassium chloride  40 mEq Oral BID  . DISCONTD: ipratropium  0.5 mg Nebulization QID  . DISCONTD: lisinopril  5 mg Oral Daily   Continuous Infusions:   . DISCONTD: dextrose 5 % and 0.9 % NaCl with KCl 20 mEq/L 100 mL/hr at 09/05/11 0043   PRN Meds:.acetaminophen, acetaminophen, HYDROmorphone, oxyCODONE  Assessment/Plan:  Principal Problem:  *Acute pancreatitis * Pneumonia *Hematuria *Fever *Hypokalemia  Secondary Problems:  Seizure  HTN (hypertension), malignant  Abdominal pain  1.  Pancreatitis - Abdominal pain improved. Tolerating Liquids. Will advance diet to heart healthy.  DC lisinopril. 2.  Pnuemonia - Pt on Azith and Rocephin 3.  Hematuria with Urgency - on Rocephin  (would treat possible UTI).  Nothing evident on CT or U/S 4.  Fever - Low grade with diaphoresis.  Etiology could be current infection - However other more rare etiologies are possible - HIV, Autoimmune disorder.   4.  Hypokalemia - on supplimentation, continue to check bmet daily. 5.  Recent Cardiac Arrest will remain on Telemetry. 6.  Hyponatremia.  Will D/C fluids.   LOS: 2 days   YORK,Vinnie Bobst L 09/05/2011, 10:13 AM

## 2011-09-06 LAB — CBC
HCT: 34.4 % — ABNORMAL LOW (ref 39.0–52.0)
MCHC: 34.9 g/dL (ref 30.0–36.0)
Platelets: 304 10*3/uL (ref 150–400)
RDW: 13.4 % (ref 11.5–15.5)

## 2011-09-06 LAB — BASIC METABOLIC PANEL
BUN: 4 mg/dL — ABNORMAL LOW (ref 6–23)
GFR calc Af Amer: >90 mL/min (ref >90)
GFR calc non Af Amer: >90 mL/min (ref >90)
Potassium: 4.1 mEq/L (ref 3.5–5.1)

## 2011-09-06 LAB — LIPASE, BLOOD: Lipase: 148 U/L — ABNORMAL HIGH (ref 11–59)

## 2011-09-06 MED ORDER — POTASSIUM CHLORIDE ER 10 MEQ PO TBCR: 10.0000 meq | EXTENDED_RELEASE_TABLET | Freq: Two times a day (BID) | ORAL | Status: DC

## 2011-09-06 MED ORDER — MOXIFLOXACIN HCL 400 MG PO TABS: 400.0000 mg | ORAL_TABLET | Freq: Every day | ORAL | Status: DC

## 2011-09-06 MED ORDER — POTASSIUM CHLORIDE ER 10 MEQ PO TBCR: 10.0000 meq | EXTENDED_RELEASE_TABLET | Freq: Every day | ORAL | Status: DC

## 2011-09-06 MED ORDER — GUAIFENESIN ER 600 MG PO TB12: 600.0000 mg | ORAL_TABLET | Freq: Two times a day (BID) | ORAL | Status: AC

## 2011-09-06 NOTE — Progress Notes (Signed)
Gave patient discharge orders and prescriptions.  All questions answered.  Skin WNL.  D'cd IV. Site unremarkable.  Pt. Declined wheelchair transportation.  Will ambulate downstairs with significant other to be transported home Keva Darty, Cira Rue

## 2011-09-06 NOTE — Discharge Summary (Signed)
Patient ID: Ross [name] MRN: [id number] DOB/AGE: 43-Jul-1967 43 y.o.  Admit date: 09/03/2011 Discharge date: 09/06/2011  [facility] Physician[facility]  [facility] [place] Cardiologist:  Everette Rank, [facility] Neurologist:  [facility]  Discharge Diagnoses:   Present on Admission:  .Acute pancreatitis .Abdominal pain .HTN (hypertension), malignant .Seizure .Pneumonia .Fever    Current Discharge Medication List    START taking these medications   Details  guaiFENesin (MUCINEX) 600 MG 12 hr tablet Take 1 tablet (600 mg total) by mouth 2 (two) times daily.    moxifloxacin (AVELOX) 400 MG tablet Take 1 tablet (400 mg total) by mouth daily. Qty: 4 tablet, Refills: 0    potassium chloride (K-DUR) 10 MEQ tablet Take 1 tablet (10 mEq total) by mouth daily. Qty: 20 tablet, Refills: 0      CONTINUE these medications which have NOT CHANGED   Details  cetaphil (CETAPHIL) cream Apply 1 application topically daily as needed. Apply after bathing    magnesium oxide (MAG-OX) 400 MG tablet Take 1 tablet (400 mg total) by mouth 2 (two) times daily. Qty: 30 tablet, Refills: 0    metoprolol tartrate (LOPRESSOR) 25 MG tablet Take 1 tablet (25 mg total) by mouth 2 (two) times daily. Qty: 60 tablet, Refills: 0    phenytoin (DILANTIN) 300 MG ER capsule Take 1 capsule (300 mg total) by mouth daily. Qty: 30 capsule, Refills: 0      STOP taking these medications     lisinopril (PRINIVIL,ZESTRIL) 5 MG tablet         Brief H and P From the admission note:  Ross [name] is a 45 y.o. male with history of a hypertension. Patient was discharged recently from the hospital 09/01/2011 after he was admitted for of cardiopulmonary arrest secondary to QT prolongation resulted from anticholinergic toxicity, patient did have ventilator dependent respiratory failure, acute encephalopathy and seizure as well. Patient was discharged on the 15th he was doing fine without any  complaints he said he went home and he was eating and drinking without any problems this morning about 3 AM he developed severe epigastric abdominal pain. He rated the pain as a 9.5/10 the pain does not have radiation, sharp, does not remember anything increases or decreases the pain so patient came in straight to the ER for further evaluation. Upon initial evaluation in the emergency department patient was found to have lipase level of 531, abdominal ultrasound was normal, patient admitted for further evaluation.  Hospital Course:  Principal Problem:  *Acute pancreatitis - The patient's lipase was initially elevated at 531.  He was made NPO and placed on pain medications and IV fluids. Over the next couple of days he progressed rapidly and was able to be advanced to clear liquid, and the 19th was advanced to heart healthy solid diet he is not complaining of any abdominal pain or vomiting. He is not requiring the use of pain medications.  For the past 3 days his lipase has been in the 140-150 range. Been strongly counseled not to drink alcohol, and his lisinopril has been discontinued.   Pneumonia:  His chest x-ray in the emergency department demonstrated pneumonia. He was treated with IV Rocephin and azithromycin. Had an intermittent low-grade fevers (99-100) not complaining of cough shortness of breath or chest pain. Discharged with 3 more days of by mouth Avelox to complete his antibiotic therapy.  The rest of his discharge diagnoses were  fortunately quiet during this admission.  He remained on cardiac  monitoring and had no arrhythmias rather he remained in normal sinus rhythm.  Physical Exam on Discharge: General: Alert, awake, oriented x3, in no acute distress, has a slight tremor in his hands. HEENT: No bruits, no goiter. Heart: Regular rate and rhythm, without murmurs, rubs, gallops. Lungs: Clear to auscultation bilaterally. Abdomen: Soft, nontender, nondistended, positive bowel  sounds. Extremities: No clubbing cyanosis or edema with positive pedal pulses. Neuro: Grossly intact, nonfocal.  Filed Vitals:   09/05/11 2123 09/06/11 0530 09/06/11 0936 09/06/11 1300  BP: 113/70 129/77 119/76 113/72  Pulse: 87 85 106 75  Temp: 98.2 F (36.8 C) 99.3 F (37.4 C)  99.7 F (37.6 C)  TempSrc:  Oral  Oral  Resp: 18 18  18   Height:      Weight:      SpO2: 95% 97%  96%     Intake/Output Summary (Last 24 hours) at 09/06/11 1339 Last data filed at 09/06/11 1300  Gross per 24 hour  Intake    358 ml  Output      1 ml  Net    357 ml      CBC:    Component Value Date/Time   WBC 7.1 09/06/2011 0807   HGB 12.0* 09/06/2011 0807   HCT 34.4* 09/06/2011 0807   PLT 304 09/06/2011 0807   MCV 86.9 09/06/2011 0807   NEUTROABS 9.8* 09/03/2011 0601   LYMPHSABS 1.1 09/03/2011 0601   MONOABS 1.0 09/03/2011 0601   EOSABS 0.1 09/03/2011 0601   BASOSABS 0.0 09/03/2011 0601    Basic Metabolic Panel:    Component Value Date/Time   NA 131* 09/06/2011 0807   K 4.1 09/06/2011 0807   CL 100 09/06/2011 0807   CO2 19 09/06/2011 0807   BUN 4* 09/06/2011 0807   CREATININE 0.81 09/06/2011 0807   GLUCOSE 90 09/06/2011 0807   CALCIUM 9.5 09/06/2011 0807    Other Pertinent Lab Results:    Significant Diagnostic Studies:  Dg Chest 2 View  09/03/2011  *RADIOLOGY REPORT*    IMPRESSION: Right greater than left lower lobe airspace disease which could represent pneumonia, less likely focal atelectasis.  US Abdomen Complete  09/03/2011  *RADIOLOGY REPORT*  Clinical Data:  Abdominal pain.  COMPLETE ABDOMINAL ULTRASOUND    IMPRESSION: Negative for gallstones or acute finding.  Fatty infiltration of the liver.    Ct Abdomen Pelvis W Contrast  09/03/2011  *RADIOLOGY REPORT*  Clinical Data: Acute pancreatitis, right upper abdominal pain, hypertension  CT ABDOMEN AND PELVIS WITH CONTRAST   IMPRESSION:  1.  Mild peripancreatic retroperitoneal inflammatory/edematous changes. 2.   Bilateral lower lobe airspace consolidation, right worse than left, suggesting pneumonia. 3.  Small right pleural effusion.    Disposition and Follow-up:  He will be discharged home in stable condition in care of his wife. He will have followup with the following providers: #1 Dr. Lance Muss on November 27 at 9:15 AM; 2 [facility] will call him with an appointment. #3 the patient will be seen at the [facility] on December 3 for primary care followup.   Time spent on Discharge: 60 min  Signed: Algis Downs L 09/06/2011, 1:39 PM

## 2011-09-06 NOTE — Discharge Summary (Signed)
Addendum  Patient seen and examined, chart and data base reviewed.  I agree with the above assessment and plan  For full details please see Mrs. Algis Downs PA. Note.  Time spent 60 minues  Kylor Valverde A  09/06/2011, 3:58 PM

## 2011-09-11 ENCOUNTER — Inpatient Hospital Stay (HOSPITAL_COMMUNITY)
Admission: EM | Admit: 2011-09-11 | Discharge: 2011-09-14 | DRG: 179 | Disposition: A | Discharge status: Home / Self Care | Payer: Non-veteran care | Source: Ambulatory Visit | Attending: Internal Medicine | Admitting: Internal Medicine

## 2011-09-11 ENCOUNTER — Emergency Department (HOSPITAL_COMMUNITY): Payer: Non-veteran care

## 2011-09-11 ENCOUNTER — Encounter (HOSPITAL_COMMUNITY): Payer: Self-pay | Admitting: *Deleted

## 2011-09-11 DIAGNOSIS — F4310 Post-traumatic stress disorder, unspecified: Secondary | ICD-10-CM | POA: Diagnosis present

## 2011-09-11 DIAGNOSIS — F411 Generalized anxiety disorder: Secondary | ICD-10-CM | POA: Diagnosis present

## 2011-09-11 DIAGNOSIS — J189 Pneumonia, unspecified organism: Secondary | ICD-10-CM

## 2011-09-11 DIAGNOSIS — J690 Pneumonitis due to inhalation of food and vomit: Principal | ICD-10-CM | POA: Diagnosis present

## 2011-09-11 DIAGNOSIS — I10 Essential (primary) hypertension: Secondary | ICD-10-CM | POA: Diagnosis present

## 2011-09-11 DIAGNOSIS — R1011 Right upper quadrant pain: Secondary | ICD-10-CM | POA: Diagnosis present

## 2011-09-11 DIAGNOSIS — Z87891 Personal history of nicotine dependence: Secondary | ICD-10-CM

## 2011-09-11 DIAGNOSIS — Z8674 Personal history of sudden cardiac arrest: Secondary | ICD-10-CM

## 2011-09-11 DIAGNOSIS — R569 Unspecified convulsions: Secondary | ICD-10-CM | POA: Diagnosis present

## 2011-09-11 DIAGNOSIS — R0602 Shortness of breath: Secondary | ICD-10-CM

## 2011-09-11 HISTORY — DX: Essential (primary) hypertension: I10

## 2011-09-11 HISTORY — DX: Acute pancreatitis without necrosis or infection, unspecified: K85.90

## 2011-09-11 HISTORY — DX: Anxiety disorder, unspecified: F41.9

## 2011-09-11 HISTORY — DX: Shortness of breath: R06.02

## 2011-09-11 LAB — DIFFERENTIAL
Basophils Absolute: 0 10*3/uL (ref 0.0–0.1)
Basophils Relative: 1 % (ref 0–1)
Eosinophils Absolute: 0.1 10*3/uL (ref 0.0–0.7)
Eosinophils Relative: 1 % (ref 0–5)
Monocytes Absolute: 0.7 10*3/uL (ref 0.1–1.0)
Monocytes Relative: 8 % (ref 3–12)
Neutro Abs: 6.2 10*3/uL (ref 1.7–7.7)

## 2011-09-11 LAB — CBC
HCT: 37.2 % — ABNORMAL LOW (ref 39.0–52.0)
Hemoglobin: 12.6 g/dL — ABNORMAL LOW (ref 13.0–17.0)
Hemoglobin: 11.2 g/dL — ABNORMAL LOW (ref 13.0–17.0)
MCH: 29.5 pg (ref 26.0–34.0)
MCHC: 33.9 g/dL (ref 30.0–36.0)
MCV: 87.1 fL (ref 78.0–100.0)
RBC: 3.73 MIL/uL — ABNORMAL LOW (ref 4.22–5.81)
RDW: 13.5 % (ref 11.5–15.5)
WBC: 7 10*3/uL (ref 4.0–10.5)

## 2011-09-11 LAB — URINALYSIS, ROUTINE W REFLEX MICROSCOPIC
Glucose, UA: NEGATIVE mg/dL
Ketones, ur: NEGATIVE mg/dL
Leukocytes, UA: NEGATIVE
Nitrite: NEGATIVE
Protein, ur: 30 mg/dL — AB
pH: 5 (ref 5.0–8.0)

## 2011-09-11 LAB — COMPREHENSIVE METABOLIC PANEL
AST: 21 U/L (ref 0–37)
Albumin: 3 g/dL — ABNORMAL LOW (ref 3.5–5.2)
BUN: 5 mg/dL — ABNORMAL LOW (ref 6–23)
Calcium: 10 mg/dL (ref 8.4–10.5)
Chloride: 101 mEq/L (ref 96–112)
Creatinine, Ser: 0.78 mg/dL (ref 0.50–1.35)
Total Bilirubin: 0.3 mg/dL (ref 0.3–1.2)

## 2011-09-11 LAB — URINE MICROSCOPIC-ADD ON

## 2011-09-11 LAB — LIPASE, BLOOD: Lipase: 111 U/L — ABNORMAL HIGH (ref 11–59)

## 2011-09-11 LAB — CREATININE, SERUM
Creatinine, Ser: 1.01 mg/dL (ref 0.50–1.35)
GFR calc non Af Amer: 89 mL/min — ABNORMAL LOW (ref >90)

## 2011-09-11 MED ORDER — RACEPINEPHRINE HCL 2.25 % IN NEBU
INHALATION_SOLUTION | RESPIRATORY_TRACT | Status: AC
  Filled 2011-09-11: qty 0.5

## 2011-09-11 MED ORDER — ACETAMINOPHEN 325 MG PO TABS
650.0000 mg | ORAL_TABLET | Freq: Four times a day (QID) | ORAL | Status: DC | PRN
  Administered 2011-09-12: 650 mg via ORAL
  Filled 2011-09-11 (×2): qty 2

## 2011-09-11 MED ORDER — SENNA 8.6 MG PO TABS
2.0000 | ORAL_TABLET | Freq: Every day | ORAL | Status: DC | PRN
Start: 2011-09-11 — End: 2011-09-14
  Filled 2011-09-11: qty 2

## 2011-09-11 MED ORDER — MORPHINE SULFATE 2 MG/ML IJ SOLN
1.0000 mg | INTRAMUSCULAR | Status: DC | PRN
  Administered 2011-09-11 – 2011-09-12 (×6): 1 mg via INTRAVENOUS
  Filled 2011-09-11 (×6): qty 1

## 2011-09-11 MED ORDER — ENOXAPARIN SODIUM 40 MG/0.4ML ~~LOC~~ SOLN
40.0000 mg | SUBCUTANEOUS | Status: DC
  Administered 2011-09-11 – 2011-09-13 (×3): 40 mg via SUBCUTANEOUS
  Filled 2011-09-11 (×4): qty 0.4

## 2011-09-11 MED ORDER — METOPROLOL TARTRATE 25 MG PO TABS
25.0000 mg | ORAL_TABLET | Freq: Two times a day (BID) | ORAL | Status: DC
  Administered 2011-09-11 – 2011-09-14 (×7): 25 mg via ORAL
  Filled 2011-09-11 (×8): qty 1

## 2011-09-11 MED ORDER — GUAIFENESIN ER 600 MG PO TB12
600.0000 mg | ORAL_TABLET | Freq: Two times a day (BID) | ORAL | Status: DC
  Administered 2011-09-11 – 2011-09-14 (×7): 600 mg via ORAL
  Filled 2011-09-11 (×8): qty 1

## 2011-09-11 MED ORDER — PHENYTOIN SODIUM EXTENDED 100 MG PO CAPS
300.0000 mg | ORAL_CAPSULE | Freq: Every day | ORAL | Status: DC
  Administered 2011-09-11 – 2011-09-14 (×4): 300 mg via ORAL
  Filled 2011-09-11 (×4): qty 3

## 2011-09-11 MED ORDER — FENTANYL CITRATE 0.05 MG/ML IJ SOLN
100.0000 ug | Freq: Once | INTRAMUSCULAR | Status: AC
  Administered 2011-09-11: 100 ug via INTRAVENOUS
  Filled 2011-09-11: qty 2

## 2011-09-11 MED ORDER — ACETAMINOPHEN 650 MG RE SUPP: 650.0000 mg | Freq: Four times a day (QID) | RECTAL | Status: DC | PRN

## 2011-09-11 MED ORDER — ONDANSETRON HCL 4 MG/2ML IJ SOLN
4.0000 mg | Freq: Once | INTRAMUSCULAR | Status: AC
  Administered 2011-09-11: 4 mg via INTRAVENOUS
  Filled 2011-09-11: qty 2

## 2011-09-11 MED ORDER — SODIUM CHLORIDE 0.9 % IV SOLN
INTRAVENOUS | Status: DC
  Administered 2011-09-11 – 2011-09-13 (×3): via INTRAVENOUS

## 2011-09-11 MED ORDER — HYDROMORPHONE HCL PF 2 MG/ML IJ SOLN
INTRAMUSCULAR | Status: AC
  Filled 2011-09-11: qty 1

## 2011-09-11 MED ORDER — VANCOMYCIN HCL IN DEXTROSE 1-5 GM/200ML-% IV SOLN
1000.0000 mg | Freq: Once | INTRAVENOUS | Status: AC
  Administered 2011-09-11: 1000 mg via INTRAVENOUS
  Filled 2011-09-11: qty 200

## 2011-09-11 MED ORDER — SODIUM CHLORIDE 0.9 % IV SOLN
3.0000 g | Freq: Four times a day (QID) | INTRAVENOUS | Status: DC
  Administered 2011-09-11 – 2011-09-14 (×12): 3 g via INTRAVENOUS
  Filled 2011-09-11 (×14): qty 3

## 2011-09-11 MED ORDER — HYDROMORPHONE HCL PF 1 MG/ML IJ SOLN
1.0000 mg | Freq: Once | INTRAMUSCULAR | Status: AC
  Administered 2011-09-11: 1 mg via INTRAVENOUS
  Filled 2011-09-11: qty 1

## 2011-09-11 MED ORDER — PIPERACILLIN-TAZOBACTAM 3.375 G IVPB
3.3750 g | Freq: Once | INTRAVENOUS | Status: AC
  Administered 2011-09-11: 3.375 g via INTRAVENOUS
  Filled 2011-09-11: qty 50

## 2011-09-11 MED ORDER — HYDROCODONE-ACETAMINOPHEN 5-325 MG PO TABS
1.0000 | ORAL_TABLET | ORAL | Status: DC | PRN
  Administered 2011-09-12: 1 via ORAL
  Administered 2011-09-13 – 2011-09-14 (×5): 2 via ORAL
  Filled 2011-09-11: qty 1
  Filled 2011-09-11 (×5): qty 2

## 2011-09-11 MED ORDER — KETOROLAC TROMETHAMINE 30 MG/ML IJ SOLN
INTRAMUSCULAR | Status: AC
  Administered 2011-09-11: 30 mg
  Filled 2011-09-11: qty 1

## 2011-09-11 MED ORDER — SODIUM CHLORIDE 0.9 % IV BOLUS (SEPSIS)
1000.0000 mL | Freq: Once | INTRAVENOUS | Status: AC
  Administered 2011-09-11: 1000 mL via INTRAVENOUS

## 2011-09-11 MED ORDER — FENTANYL CITRATE 0.05 MG/ML IJ SOLN
INTRAMUSCULAR | Status: AC
  Administered 2011-09-11: 100 ug
  Filled 2011-09-11: qty 2

## 2011-09-11 NOTE — ED Notes (Signed)
Asked pt if he could give me a urine sample, he said not right now he would let me know when he has to go.

## 2011-09-11 NOTE — Progress Notes (Signed)
ANTIBIOTIC CONSULT NOTE - INITIAL  Pharmacy Consult for Unasyn Indication: Aspiration Pneumonia  Allergies  Allergen Reactions  . Benadryl (Altaryl)     anaphylaxis    Patient Measurements: Height: 5\' 10"  (177.8 cm) Weight: 240 lb (108.863 kg) IBW/kg (Calculated) : 73    Vital Signs: Temp: 98.5 F (36.9 C) (11/25 1230) Temp src: Oral (11/25 1230) BP: 112/66 mmHg (11/25 1230) Pulse Rate: 81  (11/25 1230) Intake/Output from previous day:   Intake/Output from this shift:    Labs:  [facility] 09/11/11 0640  WBC 8.6  HGB 12.6*  PLT 290  LABCREA --  CREATININE 0.78   Estimated Creatinine Clearance: 147.2 ml/min (by C-G formula based on Cr of 0.78). No results found for this basename: VANCOTROUGH:2,VANCOPEAK:2,VANCORANDOM:2,GENTTROUGH:2,GENTPEAK:2,GENTRANDOM:2,TOBRATROUGH:2,TOBRAPEAK:2,TOBRARND:2,AMIKACINPEAK:2,AMIKACINTROU:2,AMIKACIN:2, in the last 72 hours   Microbiology: Recent Results (from the past 720 hour(s))  MRSA PCR SCREENING     Status: Normal   Collection Time   08/25/11 11:31 AM      Component Value Range Status Comment   MRSA by PCR NEGATIVE  NEGATIVE  Final     Medical History: Past Medical History  Diagnosis Date  . Sleep apnea     had surgery to correct it  . Mental disorder     PTSD  . Seizures     currently dx with seizures  . Cardiac arrest 08/23/2011  . Pneumonia 09/04/2011  . Pancreatitis   . SOB (shortness of breath) 09/11/2011    Medications:   (Not in a hospital admission) Anti-infectives     Start     Dose/Rate Route Frequency Ordered Stop   09/11/11 0915   vancomycin (VANCOCIN) IVPB 1000 mg/200 mL premix        1,000 mg 200 mL/hr over 60 Minutes Intravenous  Once 09/11/11 0910 09/11/11 1225   09/11/11 0915   piperacillin-tazobactam (ZOSYN) IVPB 3.375 g        3.375 g 12.5 mL/hr over 240 Minutes Intravenous  Once 09/11/11 0910           Assessment: 45 yom with recent discharge from hospital for acute pancreatitis  complicated by pneumonia. He was sent home on a seven day course of Avelox. He condition was improved up until two days ago where SOB increased. Patient was given vancomycin and zosyn in ED. Per admitting orders will start Unasyn for broad and anaerobic coverage. Renal function is appropriate and no adjustments are warranted at this time.  Plan:  1.Unasyn 3 grams IV q 6 hours 2.Follow renal function and cx data if avaiable.  Severiano Gilbert 09/11/2011,1:08 PM

## 2011-09-11 NOTE — ED Provider Notes (Deleted)
Medical screening examination/treatment/procedure(s) were performed by non-physician practitioner and as supervising physician I was immediately available for consultation/collaboration.  Olivia Mackie, MD 09/11/11 [phone]

## 2011-09-11 NOTE — ED Provider Notes (Signed)
History     CSN: [id number] Arrival date & time: 09/11/2011  6:02 AM   First MD Initiated Contact with Patient 09/11/11 [phone]      Chief Complaint  Patient presents with  . Abdominal Pain    (Consider location/radiation/quality/duration/timing/severity/associated sxs/prior treatment) HPI Comments: Patient with previous admission for pancreatitis - presents with similar right sided abdominal pain that started at approximately 1 AM. Patient denies fever, nausea, vomiting. States that he had several episodes of watery diarrhea yesterday. He has not had any treatments for this at home.  Patient denies drinking alcohol. Prior to admission for acute pancreatitis -- pt was admitted s/p vtach arrest 2/2 anticholinergic overdose.   Patient is a 45 y.o. male presenting with abdominal pain. The history is provided by the patient.  Abdominal Pain The primary symptoms of the illness include abdominal pain and diarrhea. The primary symptoms of the illness do not include fever, shortness of breath, nausea, vomiting or dysuria. The current episode started 6 to 12 hours ago. The onset of the illness was sudden. The problem has been gradually worsening.  The patient has had a change in bowel habit. Symptoms associated with the illness do not include chills, constipation, hematuria or back pain.    Past Medical History  Diagnosis Date  . Sleep apnea     had surgery to correct it  . Mental disorder     PTSD  . Seizures     currently dx with seizures  . Cardiac arrest 08/23/2011  . Pneumonia 09/04/2011  . Pancreatitis     Past Surgical History  Procedure Date  . Tonsillectomy   . Uvulopalatopharyngoplasty, tonsillectomy and septoplasty 06/16/2000    Family History  Problem Relation Age of Onset  . Heart attack Mother     History  Substance Use Topics  . Smoking status: Never Smoker   . Smokeless tobacco: Former Neurosurgeon    Quit date: 08/22/2008  . Alcohol Use: 0.5 oz/week    1 drink(s) per  week     no alcohol in past 30 days      Review of Systems  Constitutional: Negative for fever and chills.  HENT: Negative for sore throat and rhinorrhea.   Eyes: Negative for redness.  Respiratory: Negative for shortness of breath.   Cardiovascular: Negative for chest pain.  Gastrointestinal: Positive for abdominal pain and diarrhea. Negative for nausea, vomiting and constipation.  Genitourinary: Negative for dysuria and hematuria.  Musculoskeletal: Negative for myalgias and back pain.  Skin: Negative for rash.  Neurological: Negative for dizziness and headaches.    Allergies  Review of patient's allergies indicates no known allergies.  Home Medications   Current Outpatient Rx  Name Route Sig Dispense Refill  . CETAPHIL EX CREA Topical Apply 1 application topically daily as needed. Apply after bathing    . GUAIFENESIN 600 MG PO TB12 Oral Take 1 tablet (600 mg total) by mouth 2 (two) times daily.    Marland Kitchen MAGNESIUM OXIDE 400 MG PO TABS Oral Take 1 tablet (400 mg total) by mouth 2 (two) times daily. 30 tablet 0  . METOPROLOL TARTRATE 25 MG PO TABS Oral Take 1 tablet (25 mg total) by mouth 2 (two) times daily. 60 tablet 0  . MOXIFLOXACIN HCL 400 MG PO TABS Oral Take 1 tablet (400 mg total) by mouth daily. 4 tablet 0  . PHENYTOIN SODIUM EXTENDED 300 MG PO CAPS Oral Take 1 capsule (300 mg total) by mouth daily. 30 capsule 0  . POTASSIUM CHLORIDE  CR 10 MEQ PO TBCR Oral Take 1 tablet (10 mEq total) by mouth daily. 20 tablet 0    BP 133/91  Pulse 114  Temp(Src) 98.1 F (36.7 C) (Oral)  Ht 5\' 10"  (1.778 m)  Wt 240 lb (108.863 kg)  BMI 34.44 kg/m2  SpO2 100%  Physical Exam  Nursing note and vitals reviewed. Constitutional: He is oriented to person, place, and time. He appears well-developed and well-nourished.  HENT:  Head: Normocephalic and atraumatic.  Eyes: Conjunctivae are normal. Pupils are equal, round, and reactive to light. Right eye exhibits no discharge. Left eye  exhibits no discharge.  Neck: Normal range of motion. Neck supple.  Cardiovascular: Normal rate, regular rhythm and normal heart sounds.   Pulmonary/Chest: Effort normal and breath sounds normal.  Abdominal: Soft. Bowel sounds are normal. There is tenderness. There is no rebound and no guarding.       Diffuse right-sided abdominal tenderness. Patient refuses to lie flat due to pain.  Musculoskeletal: He exhibits no edema.  Neurological: He is alert and oriented to person, place, and time.  Skin: Skin is warm and dry.  Psychiatric: He has a normal mood and affect.    ED Course  Procedures (including critical care time)  Labs Reviewed  CBC - Abnormal; Notable for the following:    Hemoglobin 12.6 (*)    HCT 37.2 (*)    All other components within normal limits  COMPREHENSIVE METABOLIC PANEL - Abnormal; Notable for the following:    Sodium 134 (*)    Glucose, Bld 120 (*)    BUN 5 (*)    Albumin 3.0 (*)    Alkaline Phosphatase 128 (*)    All other components within normal limits  LIPASE, BLOOD - Abnormal; Notable for the following:    Lipase 111 (*)    All other components within normal limits  URINALYSIS, ROUTINE W REFLEX MICROSCOPIC - Abnormal; Notable for the following:    Hgb urine dipstick TRACE (*)    Protein, ur 30 (*)    All other components within normal limits  DIFFERENTIAL  URINE MICROSCOPIC-ADD ON   Dg Chest 2 View  09/11/2011  *RADIOLOGY REPORT*  Clinical Data: Right upper abdominal pain, cough, chest pain  CHEST - 2 VIEW  Comparison: 09/03/2011  Findings: Patchy airspace opacities in basilar segments of the right lower lobe, slightly increased since previous exam.  Left lung clear.  No definite effusion.  Heart size upper limits normal. Relatively low lung volumes.  Regional bones unremarkable.  IMPRESSION:  1.  Some interval increase in patchy right lower lobe airspace disease.  Original Report Authenticated By: Osa Craver, M.D.     No diagnosis  found.  6:30 AM Pt seen and examined. Previous records reviewed. Labs ordered.  7:41 AM Pt notes improvement in pain but still continues to have pain. Medication re-dosed.   10:02 AM Patient was discussed with Ward Givens, MD  Will admit for HCAP, failure of outpt treatment.   10:23 AM [facility] team 4 to admit pt.    MDM  HCAP, failure of outpt treatment. Will admit for IV abx.         Eustace Moore [place], [place] 09/11/11 1023

## 2011-09-11 NOTE — ED Notes (Signed)
Patient returned from x-ray and continues to complain of abdominal pain. He is alert and oriented. Pt denies any n/v/d. Breath sounds are clear and bowel sounds are present. Last bm was reported as normal and was yesterday. Iv site clear for infiltrate. Second bag of fluids hung. Pain medications changed and pt given fentanyl. Nursing to evaluate to see if pain improves with different medication. Family at the bedside offered drinks. They state that they are fine at the moment.

## 2011-09-11 NOTE — ED Provider Notes (Signed)
Patient recently admitted after a rest from an anticholinergic overdose. He's been on Avelox for a pneumonia. He also has been treated for pancreatitis. He relates he's been having right upper quadrant right lower abdominal pain for the past 2 weeks it is pleuritic. He has a dry cough without fever but he has a lot of dyspnea on exertion. Patient is alert and cooperative he is a very flat affect he is in no respiratory distress at this time. Chest x-ray today shows worsening of his infiltrate. It is felt he needs to be admitted for failure to respond to treatment for healthcare associated pneumonia.  Medical screening examination/treatment/procedure(s) were conducted as a shared visit with non-physician practitioner(s) and myself.  I personally evaluated the patient during the encounter Devoria Albe, MD, Franz Dell, MD 09/11/11 1018

## 2011-09-11 NOTE — ED Notes (Signed)
Pt c/o right sided abdominal pain since this morning at 1 am.  Unable to sleep tonight.  Denies n/v at this time.

## 2011-09-11 NOTE — ED Notes (Signed)
Report received from [place]oy, EMT; introduced self; pt resting; family at bedside; no needs at this time

## 2011-09-11 NOTE — H&P (Signed)
PCP:   No primary provider on file.   Chief Complaint:  Shortness of breath, nonproductive cough, right upper quadrant abdominal pain.  HPI: Patient is a pleasant 45 year old black man who was just recently discharged from the hospital 5 days ago after being treated for an episode of acute pancreatitis. His medical history is significant for an admission at the beginning of the month after a severe anticholinergic overdose that caused seizures and cardiac arrest. Since then he has been taking antiseizure medications. During his most recent admission he was diagnosed with a pneumonia and was discharged home with a seven-day course of Avelox. He states he was doing fairly well until 2 days ago when he started having increased shortness of breath, pleuritic chest pain and nonproductive cough. He returns to the hospital today where chest x-ray shows increase right base infiltrate so we're asked to admit him for further evaluation and management. He denies fevers but has had subjective chills.  Allergies:   Allergies  Allergen Reactions  . Benadryl (Altaryl)     anaphylaxis      Past Medical History  Diagnosis Date  . Sleep apnea     had surgery to correct it  . Mental disorder     PTSD  . Seizures     currently dx with seizures  . Cardiac arrest 08/23/2011  . Pneumonia 09/04/2011  . Pancreatitis   . SOB (shortness of breath) 09/11/2011    Past Surgical History  Procedure Date  . Tonsillectomy   . Uvulopalatopharyngoplasty, tonsillectomy and septoplasty 06/16/2000    Prior to Admission medications   Medication Sig Start Date End Date Taking? Authorizing Provider  cetaphil (CETAPHIL) cream Apply 1 application topically daily as needed. Apply after bathing   Yes Historical Provider, MD  guaiFENesin (MUCINEX) 600 MG 12 hr tablet Take 1 tablet (600 mg total) by mouth 2 (two) times daily. 09/06/11 09/05/12 Yes Marianne L York, PA  magnesium oxide (MAG-OX) 400 MG tablet Take 1 tablet  (400 mg total) by mouth 2 (two) times daily. 09/01/11 08/31/12 Yes Mutaz A Elmahi  metoprolol tartrate (LOPRESSOR) 25 MG tablet Take 1 tablet (25 mg total) by mouth 2 (two) times daily. 09/01/11 08/31/12 Yes Mutaz A Elmahi  phenytoin (DILANTIN) 300 MG ER capsule Take 1 capsule (300 mg total) by mouth daily. 09/01/11 08/31/12 Yes Mutaz A Elmahi  potassium chloride (K-DUR) 10 MEQ tablet Take 1 tablet (10 mEq total) by mouth daily. 09/06/11 09/05/12 Yes Stephani Police, PA    Social History:  reports that he has never smoked. He quit smokeless tobacco use about 3 years ago. He reports that he drinks about .5 ounces of alcohol per week. He reports that he does not use illicit drugs.  Family History  Problem Relation Age of Onset  . Heart attack Mother     Review of Systems:  Constitutional: Denies fever,, diaphoresis. HEENT: Denies photophobia, eye pain, redness, hearing loss, ear pain, congestion, sore throat, rhinorrhea, sneezing, mouth sores, trouble swallowing, neck pain, neck stiffness and tinnitus.   Respiratory: Denies wheezing.   Cardiovascular: Denies  palpitations and leg swelling.  Gastrointestinal: Denies nausea, vomiting, abdominal pain, diarrhea, constipation, blood in stool and abdominal distention.  Genitourinary: Denies dysuria, urgency, frequency, hematuria, flank pain and difficulty urinating.  Musculoskeletal: Denies myalgias, back pain, joint swelling, arthralgias and gait problem.  Skin: Denies pallor, rash and wound.  Neurological: Denies dizziness, seizures, syncope, weakness, light-headedness, numbness and headaches.  Hematological: Denies adenopathy. Easy bruising, personal or family bleeding  history  Psychiatric/Behavioral: Denies suicidal ideation, mood changes, confusion, nervousness, sleep disturbance and agitation   Physical Exam: Blood pressure 125/86, pulse 94, temperature 98.8 F (37.1 C), temperature source Oral, resp. rate 20, height 5\' 10"  (1.778 m),  weight 108.863 kg (240 lb), SpO2 99.00%. General: Alert, awake, oriented x3. HEENT: Normocephalic, atraumatic, pupils equal round and reactive to light, intact extraocular movements, dry mucous membranes. Neck: Supple, no JVD, no lymphadenopathy, no bruits, no goiter. Cardiovascular: Regular rate and rhythm, no murmurs, rubs or gallops auscultated. Lungs: Faint crackles at the right lung base otherwise clear without wheezes. Abdomen: Soft, nontender, nondistended, positive bowel sounds, no masses or organomegaly noted. Extremities: No clubbing, cyanosis or edema, positive pedal pulses. Neuro: Grossly intact and nonfocal.  Labs on Admission:  Results for orders placed during the hospital encounter of 09/11/11 (from the past 48 hour(s))  CBC     Status: Abnormal   Collection Time   09/11/11  6:40 AM      Component Value Range Comment   WBC 8.6  4.0 - 10.5 (K/uL)    RBC 4.27  4.22 - 5.81 (MIL/uL)    Hemoglobin 12.6 (*) 13.0 - 17.0 (g/dL)    HCT 91.4 (*) 78.2 - 52.0 (%)    MCV 87.1  78.0 - 100.0 (fL)    MCH 29.5  26.0 - 34.0 (pg)    MCHC 33.9  30.0 - 36.0 (g/dL)    RDW 95.6  21.3 - 08.6 (%)    Platelets 290  150 - 400 (K/uL)   DIFFERENTIAL     Status: Normal   Collection Time   09/11/11  6:40 AM      Component Value Range Comment   Neutrophils Relative 72  43 - 77 (%)    Neutro Abs 6.2  1.7 - 7.7 (K/uL)    Lymphocytes Relative 19  12 - 46 (%)    Lymphs Abs 1.6  0.7 - 4.0 (K/uL)    Monocytes Relative 8  3 - 12 (%)    Monocytes Absolute 0.7  0.1 - 1.0 (K/uL)    Eosinophils Relative 1  0 - 5 (%)    Eosinophils Absolute 0.1  0.0 - 0.7 (K/uL)    Basophils Relative 1  0 - 1 (%)    Basophils Absolute 0.0  0.0 - 0.1 (K/uL)   COMPREHENSIVE METABOLIC PANEL     Status: Abnormal   Collection Time   09/11/11  6:40 AM      Component Value Range Comment   Sodium 134 (*) 135 - 145 (mEq/L)    Potassium 3.9  3.5 - 5.1 (mEq/L)    Chloride 101  96 - 112 (mEq/L)    CO2 19  19 - 32 (mEq/L)     Glucose, Bld 120 (*) 70 - 99 (mg/dL)    BUN 5 (*) 6 - 23 (mg/dL)    Creatinine, Ser 5.78  0.50 - 1.35 (mg/dL)    Calcium 46.9  8.4 - 10.5 (mg/dL)    Total Protein 8.0  6.0 - 8.3 (g/dL)    Albumin 3.0 (*) 3.5 - 5.2 (g/dL)    AST 21  0 - 37 (U/L)    ALT 26  0 - 53 (U/L)    Alkaline Phosphatase 128 (*) 39 - 117 (U/L)    Total Bilirubin 0.3  0.3 - 1.2 (mg/dL)    GFR calc non Af Amer >90  >90 (mL/min)    GFR calc Af Amer >90  >90 (mL/min)  LIPASE, BLOOD     Status: Abnormal   Collection Time   09/11/11  6:40 AM      Component Value Range Comment   Lipase 111 (*) 11 - 59 (U/L)   URINALYSIS, ROUTINE W REFLEX MICROSCOPIC     Status: Abnormal   Collection Time   09/11/11  7:14 AM      Component Value Range Comment   Color, Urine YELLOW  YELLOW     Appearance CLEAR  CLEAR     Specific Gravity, Urine 1.020  1.005 - 1.030     pH 5.0  5.0 - 8.0     Glucose, UA NEGATIVE  NEGATIVE (mg/dL)    Hgb urine dipstick TRACE (*) NEGATIVE     Bilirubin Urine NEGATIVE  NEGATIVE     Ketones, ur NEGATIVE  NEGATIVE (mg/dL)    Protein, ur 30 (*) NEGATIVE (mg/dL)    Urobilinogen, UA 0.2  0.0 - 1.0 (mg/dL)    Nitrite NEGATIVE  NEGATIVE     Leukocytes, UA NEGATIVE  NEGATIVE    URINE MICROSCOPIC-ADD ON     Status: Normal   Collection Time   09/11/11  7:14 AM      Component Value Range Comment   Squamous Epithelial / LPF RARE  RARE     WBC, UA 0-2  <3 (WBC/hpf)    RBC / HPF 0-2  <3 (RBC/hpf)     Radiological Exams on Admission: Dg Chest 2 View  09/11/2011  *RADIOLOGY REPORT*  Clinical Data: Right upper abdominal pain, cough, chest pain  CHEST - 2 VIEW  Comparison: 09/03/2011  Findings: Patchy airspace opacities in basilar segments of the right lower lobe, slightly increased since previous exam.  Left lung clear.  No definite effusion.  Heart size upper limits normal. Relatively low lung volumes.  Regional bones unremarkable.  IMPRESSION:  1.  Some interval increase in patchy right lower lobe airspace  disease.  Original Report Authenticated By: Osa Craver, M.D.    Assessment/Plan Principal Problem:  *Aspiration pneumonia Active Problems:  RUQ abdominal pain  SOB (shortness of breath)   #1 aspiration pneumonia: I believe this is the most likely explanation for his symptoms. He likely aspirated at the time of his seizures, cardiac arrest and intubation. He did not improve with Avelox, I agree with inpatient treatment. We'll start him on Unasyn for coverage of aspiration pathogens.  Time Spent on Admission: Approximately 60 minutes.  HERNANDEZ ACOSTA,ESTELA 09/11/2011, 12:26 PM

## 2011-09-12 DIAGNOSIS — I10 Essential (primary) hypertension: Secondary | ICD-10-CM | POA: Diagnosis present

## 2011-09-12 LAB — BASIC METABOLIC PANEL
BUN: 6 mg/dL (ref 6–23)
GFR calc Af Amer: >90 mL/min (ref >90)
GFR calc non Af Amer: >90 mL/min (ref >90)
Potassium: 3.3 mEq/L — ABNORMAL LOW (ref 3.5–5.1)
Sodium: 136 mEq/L (ref 135–145)

## 2011-09-12 LAB — CBC
Hemoglobin: 10.9 g/dL — ABNORMAL LOW (ref 13.0–17.0)
MCHC: 34.1 g/dL (ref 30.0–36.0)
RBC: 3.67 MIL/uL — ABNORMAL LOW (ref 4.22–5.81)

## 2011-09-12 MED ORDER — POTASSIUM CHLORIDE CRYS ER 20 MEQ PO TBCR
40.0000 meq | EXTENDED_RELEASE_TABLET | Freq: Once | ORAL | Status: AC
  Administered 2011-09-12: 40 meq via ORAL
  Filled 2011-09-12: qty 2

## 2011-09-12 NOTE — Progress Notes (Signed)
PATIENT DETAILS Name: Ross [name] Age: 45 y.o. Sex: male Date of Birth: 05-29-1966 Admit Date: 09/11/2011 PCP:No primary provider on file.  Subjective: Feeling much better, anxious to go home soon.  Objective: Vital signs in last 24 hours: Filed Vitals:   09/11/11 1230 09/12/11 0017 09/12/11 0547 09/12/11 1424  BP: 112/66 138/87 153/88 128/82  Pulse: 81 89 91 92  Temp: 98.5 F (36.9 C) 100.2 F (37.9 C) 100.2 F (37.9 C) 100.8 F (38.2 C)  TempSrc: Oral Oral Oral Oral  Resp: 18 20 18 18   Height:      Weight:      SpO2: 99% 97% 97% 96%    Weight change:   Body mass index is 34.44 kg/(m^2).  Intake/Output from previous day:  Intake/Output Summary (Last 24 hours) at 09/12/11 1800 Last data filed at 09/12/11 1159  Gross per 24 hour  Intake    240 ml  Output    701 ml  Net   -461 ml    PHYSICAL EXAM: Gen Exam: Awake and alert with clear speech.   Neck: Supple, No JVD.   Chest: B/L Clear.   CVS: S1 S2 Regular, no murmurs.  Abdomen: soft, BS +, non tender, non distended.  Extremities: no edema, lower extremities warm to touch. Neurologic: Non Focal.   Skin: No Rash.   Wounds: N/A.    CONSULTS:  none  LAB RESULTS: CBC  Lab 09/12/11 0806 09/11/11 1416 09/11/11 0640 09/06/11 0807  WBC 7.1 7.0 8.6 7.1  HGB 10.9* 11.2* 12.6* 12.0*  HCT 32.0* 32.7* 37.2* 34.4*  PLT 248 236 290 304  MCV 87.2 87.7 87.1 86.9  MCH 29.7 30.0 29.5 30.3  MCHC 34.1 34.3 33.9 34.9  RDW 13.3 13.5 13.5 13.4  LYMPHSABS -- -- 1.6 --  MONOABS -- -- 0.7 --  EOSABS -- -- 0.1 --  BASOSABS -- -- 0.0 --  BANDABS -- -- -- --    Chemistries   Lab 09/12/11 0806 09/11/11 1416 09/11/11 0640 09/06/11 0807  NA 136 -- 134* 131*  K 3.3* -- 3.9 4.1  CL 104 -- 101 100  CO2 20 -- 19 19  GLUCOSE 199* -- 120* 90  BUN 6 -- 5* 4*  CREATININE 0.91 1.01 0.78 0.81  CALCIUM 8.9 -- 10.0 9.5  MG -- -- -- --    GFR Estimated Creatinine Clearance: 129.4 ml/min (by C-G formula based on Cr of  0.91).  Coagulation profile No results found for this basename: INR:5,PROTIME:5 in the last 168 hours  Cardiac Enzymes No results found for this basename: CK:3,CKMB:3,TROPONINI:3,MYOGLOBIN:3 in the last 168 hours  No results found for this basename: POCBNP:3 in the last 168 hours No results found for this basename: DDIMER:2 in the last 72 hours No results found for this basename: HGBA1C:2 in the last 72 hours No results found for this basename: CHOL:2,HDL:2,LDLCALC:2,TRIG:2,CHOLHDL:2,LDLDIRECT:2 in the last 72 hours No results found for this basename: TSH,T4TOTAL,FREET3,T3FREE,THYROIDAB in the last 72 hours No results found for this basename: VITAMINB12:2,FOLATE:2,FERRITIN:2,TIBC:2,IRON:2,RETICCTPCT:2 in the last 72 hours  Basename 09/11/11 0640  LIPASE 111*  AMYLASE --    Urine Studies No results found for this basename: UACOL:2,UAPR:2,USPG:2,UPH:2,UTP:2,UGL:2,UKET:2,UBIL:2,UHGB:2,UNIT:2,UROB:2,ULEU:2,UEPI:2,UWBC:2,URBC:2,UBAC:2,CAST:2,CRYS:2,UCOM:2,BILUA:2 in the last 72 hours  MICROBIOLOGY: No results found for this or any previous visit (from the past 240 hour(s)).  RADIOLOGY STUDIES/RESULTS: Dg Chest 2 View  09/11/2011  *RADIOLOGY REPORT*  Clinical Data: Right upper abdominal pain, cough, chest pain  CHEST - 2 VIEW  Comparison: 09/03/2011  Findings: Patchy airspace opacities in  basilar segments of the right lower lobe, slightly increased since previous exam.  Left lung clear.  No definite effusion.  Heart size upper limits normal. Relatively low lung volumes.  Regional bones unremarkable.  IMPRESSION:  1.  Some interval increase in patchy right lower lobe airspace disease.  Original Report Authenticated By: Osa Craver, M.D.   Dg Chest 2 View  09/03/2011  *RADIOLOGY REPORT*  Clinical Data: Shortness of breath  CHEST - 2 VIEW  Comparison: 08/29/2011, CT same date  Findings: Patchy right lower lobe airspace disease is noted with trace right pleural effusion.  Heart size  is normal. Minimal left lower lobe airspace opacity is again noted.  No acute osseous finding.  Retained contrast noted within partially visualized nondilated colon.  IMPRESSION: Right greater than left lower lobe airspace disease which could represent pneumonia, less likely focal atelectasis.  Original Report Authenticated By: Harrel Lemon, M.D.   Ct Head Wo Contrast  08/24/2011  *RADIOLOGY REPORT*  Clinical Data: Seizure.  Cardiac arrest.  Evaluate for infarct.  CT HEAD WITHOUT CONTRAST  Technique:  Contiguous axial images were obtained from the base of the skull through the vertex without contrast.  Comparison: None.  Findings: There is no evidence for acute hemorrhage, hydrocephalus, mass lesion, or abnormal extra-axial fluid collection.  No definite CT evidence for acute infarction.  The visualized paranasal sinuses and mastoid air cells are clear.  IMPRESSION: Normal exam.  Original Report Authenticated By: ERIC A. MANSELL, M.D.   [name] Wo Contrast  08/27/2011  *RADIOLOGY REPORT*  Clinical Data: Seizure.  The patient recently had a large dose of antihistamines.  MRI HEAD WITHOUT AND WITH CONTRAST  Technique:  Multiplanar, multiecho pulse sequences of the brain and surrounding structures were obtained according to standard protocol without and with intravenous contrast  Contrast: 20mL MULTIHANCE GADOBENATE DIMEGLUMINE 529 MG/ML IV SOLN  Comparison: CT head without contrast 08/24/2011.  Findings: No acute infarct, hemorrhage, mass lesion is present. Mild periventricular white matter signal is noted.  Three or four punctate subcortical hyperdensities are seen as well.  There is no enhancement associated with these lesions.  There is an area of linear enhancement involving the right parietal lobe, compatible with a benign venous angioma.  No other pathologic enhancement is evident.  Flow is present in the major intracranial arteries.  The globes orbits are intact.  The paranasal sinuses are clear.   Fluid is present in the mastoid air cells bilaterally.  No obstructing nasopharyngeal lesion is evident.  Dedicated imaging of the hippocampal structures demonstrates symmetric size and signal.  IMPRESSION:  1.  No acute or focal abnormality to explain seizures. 2.  Minimal white matter changes slightly advanced for age. The finding is nonspecific but can be seen in the setting of chronic microvascular ischemia, a demyelinating process such as multiple sclerosis, vasculitis, complicated migraine headaches, or as the sequelae of a prior infectious or inflammatory process. 3.  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 4.  Bilateral mastoid effusions are likely related to the patient's intubated status.  Original Report Authenticated By: Jamesetta Orleans. MATTERN, M.D.   US Abdomen Complete  09/03/2011  *RADIOLOGY REPORT*  Clinical Data:  Abdominal pain.  COMPLETE ABDOMINAL ULTRASOUND  Comparison:  None.  Findings:  Gallbladder:  No gallstones, gallbladder wall thickening, or pericholecystic fluid. The gallbladder is incompletely distended.  Common bile duct:  Measures 0.4 cm.  Liver:  The liver demonstrates increased echogenicity consistent with fatty  change.  No focal lesion or intrahepatic biliary ductal dilatation.  IVC:  Appears normal.  Pancreas:  No focal abnormality seen.  Spleen:  Measures 6.1 cm and appears normal.  Right Kidney:  Measures 12.1 cm and appears normal.  Left Kidney:  Measures 12.0 cm and appears normal.  Abdominal aorta:  No aneurysm identified.  IMPRESSION: Negative for gallstones or acute finding.  Fatty infiltration of the liver.  Original Report Authenticated By: Bernadene Bell. Maricela Curet, M.D.   Ct Abdomen Pelvis W Contrast  09/03/2011  *RADIOLOGY REPORT*  Clinical Data: Acute pancreatitis, right upper abdominal pain, hypertension  CT ABDOMEN AND PELVIS WITH CONTRAST  Technique:  Multidetector CT imaging of the abdomen and pelvis was performed following the  standard protocol during bolus administration of intravenous contrast.  Contrast: OMNIPAQUE IOHEXOL 300 MG/ML IV SOLN  Comparison: Ultrasound from earlier the same day  Findings: There is a tiny right pleural effusion.  There is airspace consolidation posteriorly in both visualized lung bases, more extensive on the right than left.  Unremarkable liver, gallbladder, spleen and accessory splenule, adrenal glands, pancreas, kidneys, abdominal aorta. There are mild inflammatory changes in the retroperitoneum posterior to the pancreatic head and body.  Stomach and small bowel are nondilated. Normal appendix.  Colon is nondistended with a few scattered descending and sigmoid diverticula; no adjacent inflammatory or edematous change.  Urinary bladder is incompletely distended.  Left pelvic phleboliths.  No ascites.  No free air.  Portal vein patent. No adenopathy localized. No hydronephrosis.  Degenerative disc disease L4-5 and L5-S1.  IMPRESSION:  1.  Mild peripancreatic retroperitoneal inflammatory/edematous changes. 2.  Bilateral lower lobe airspace consolidation, right worse than left, suggesting pneumonia. 3.  Small right pleural effusion.  Original Report Authenticated By: Osa Craver, M.D.   Dg Chest Portable 1 View  08/29/2011  *RADIOLOGY REPORT*  Clinical Data: Check ET tube placement, SOB  PORTABLE CHEST - 1 VIEW  Comparison: 08/28/2011  Findings: No ET tube is visualized.  There is a right arm PICC line with tip in the cavoatrial junction.  Heart size is normal.  Left upper lobe perihilar opacity has resolved from previous exam.  No new findings.  IMPRESSION:  1.  Interval resolution of left upper lobe perihilar opacity.  Original Report Authenticated By: Rosealee Albee, M.D.   Dg Chest Portable 1 View  08/28/2011  *RADIOLOGY REPORT*  Clinical Data: Checked ETT  PORTABLE CHEST - 1 VIEW  Comparison: 08/27/2011  Findings: Interval extubation.  Left mid lung opacity, suspicious for  lingular pneumonia. No pleural effusion or pneumothorax.  Stable right IJ venous catheter.  The heart is top normal in size.  IMPRESSION: Interval extubation.  Left mid lung opacity, suspicious for lingular pneumonia.  Original Report Authenticated By: Charline Bills, M.D.   Dg Chest Portable 1 View  08/27/2011  *RADIOLOGY REPORT*  Clinical Data: Evaluate ET tube placement  PORTABLE CHEST - 1 VIEW  Comparison: 08/26/2011  Findings: There is a right subclavian catheter with tip in the SVC.  The heart size is mildly enlarged.  No pleural effusions identified.  Bilateral lower lobe predominant airspace opacities have improved from previous exam.  IMPRESSION:  1.  Interval improvement in aeration to the lungs compared with previous exam.  Original Report Authenticated By: Rosealee Albee, M.D.   Dg Chest Portable 1 View  08/26/2011  *RADIOLOGY REPORT*  Clinical Data: Check endotracheal tube after placement.  PORTABLE CHEST - 1 VIEW  Comparison: 08/25/2011.  Findings: Endotracheal  tube tip 11 cm above the carina at the lower neck/thoracic inlet junction.  Correlation with breath sounds recommended to confirm this is within the trachea and not in the esophagus.  Nasogastric tube curled within the stomach.  The tip is not included present exam.  Right central line tip projects over the mid to distal superior vena cava level.  No gross pneumothorax.  Asymmetric pulmonary edema most notable perihilar region greater on the right has improved slightly since the prior exam.  Question pulmonary edema versus infectious infiltrate.  IMPRESSION: Endotracheal tube tip 11 cm above the carina at the lower neck/thoracic inlet junction.  Correlation with breath sounds recommended to confirm this is within the trachea and not in the esophagus.  Asymmetric pulmonary edema most notable perihilar region greater on the right has improved slightly since the prior exam.  Question pulmonary edema versus infectious infiltrate.   Original Report Authenticated By: Fuller Canada, M.D.   Dg Chest Portable 1 View  08/25/2011  *RADIOLOGY REPORT*  Clinical Data: Check endotracheal tube, shortness of breath  PORTABLE CHEST - 1 VIEW  Comparison: 08/24/2011  Findings: Endotracheal tube at the thoracic inlet, terminating 7 cm above the carina.  Enteric tube looped in the stomach.  Right subclavian venous catheter terminating at the cavoatrial junction.  Patchy bilateral airspace opacities, possibly reflecting edema and/or infection.  No pleural effusion or pneumothorax.  The heart is normal in size.  IMPRESSION: Endotracheal tube  terminates 10 cm above the carina.  Stable support apparatus, as above.  Patchy bilateral airspace opacities, possibly reflecting edema and/or infection.  Original Report Authenticated By: Charline Bills, M.D.   Dg Chest Portable 1 View  08/24/2011  *RADIOLOGY REPORT*  Clinical Data: Respiratory distress  PORTABLE CHEST - 1 VIEW  Comparison: 08/24/2011 8:50 hours  Findings: Under aerated lungs.  Diffuse bilateral airspace disease is worse.  Tubular devices are unchanged.  No pneumothorax.  Normal heart size.  IMPRESSION: Worsening diffuse bilateral airspace disease.  Original Report Authenticated By: Donavan Burnet, M.D.   Dg Chest Portable 1 View  08/24/2011  *RADIOLOGY REPORT*  Clinical Data: Evaluate endotracheal tube placement  PORTABLE CHEST - 1 VIEW  Comparison: 08/23/2011; 09/15/2006  Findings:  Grossly unchanged cardiac silhouette and mediastinal contours. Endotracheal tube overlies tracheal air column with tip superior to the carina.  Enteric tube terminates into the left hemidiaphragm. Unchanged positioning of a right subclavian approach central venous catheter with tip overlying the mid SVC.  Improved aeration of the lungs with persistent perihilar heterogeneous opacities.  Mild cephalization of flow without frank evidence of pulmonary edema. No definite pneumothorax or pleural effusion.  Grossly  unchanged bones.  IMPRESSION: 1.  Appropriately positioned support apparatus as above.  No definite pneumothorax. 2.  Improved aeration of the lungs with persistent perihilar opacities, possibly atelectasis though infection is not excluded.  Original Report Authenticated By: Waynard Reeds, M.D.   Dg Chest Portable 1 View  08/23/2011  *RADIOLOGY REPORT*  Clinical Data: Respiratory difficulty  PORTABLE CHEST - 1 VIEW  Comparison: 09/15/2006  Findings: Low lung volumes.  Extensive bilateral diffuse airspace disease with a central distribution.  Endotracheal tube placed. Tip is 4.2 cm from carina.  Right subclavian central venous catheter tip at the cavoatrial junction.  No pneumothorax.  IMPRESSION: Endotracheal tube.  Right subclavian central venous catheter tip at the cavoatrial junction and no pneumothorax per  Bilateral diffuse airspace disease.  Original Report Authenticated By: Donavan Burnet, M.D.    MEDICATIONS:  Scheduled Meds:   . ampicillin-sulbactam (UNASYN) IV  3 g Intravenous Q6H  . enoxaparin  40 mg Subcutaneous Q24H  . guaiFENesin  600 mg Oral BID  . metoprolol tartrate  25 mg Oral BID  . phenytoin  300 mg Oral Daily  . Racepinephrine HCl       Continuous Infusions:   . sodium chloride 75 mL/hr at 09/12/11 0813   PRN Meds:.acetaminophen, acetaminophen, HYDROcodone-acetaminophen, morphine, senna  Antibiotics: Anti-infectives     Start     Dose/Rate Route Frequency Ordered Stop   09/11/11 1800   Ampicillin-Sulbactam (UNASYN) 3 g in sodium chloride 0.9 % 100 mL IVPB        3 g 100 mL/hr over 60 Minutes Intravenous Every 6 hours 09/11/11 1415     09/11/11 0915   vancomycin (VANCOCIN) IVPB 1000 mg/200 mL premix        1,000 mg 200 mL/hr over 60 Minutes Intravenous  Once 09/11/11 0910 09/11/11 1225   09/11/11 0915  piperacillin-tazobactam (ZOSYN) IVPB 3.375 g       3.375 g 12.5 mL/hr over 240 Minutes Intravenous  Once 09/11/11 0910 09/11/11 1425           Assessment/Plan: Patient Active Hospital Problem List: Aspiration pneumonia    Assessment: Better, however continues to have some low-grade fever.    Plan: Continue with Unasyn.   Seizure    Assessment: Seizure free, this is a chronic issue.    Plan: Continue with Dilantin.   RUQ abdominal pain   Assessment: This is resolved.    Plan: Monitor, he is easy tolerating diet with no vomiting. On palpation there is no evidence of any abdominal pain.   SOB (shortness of breath)    Assessment: This is also resolved. He is certainly not hypoxic. This is possibly secondary to #1.    Plan: Continue to monitor, continue Unasyn.   HTN (hypertension)    Assessment: Controlled    Plan: Continue with Lopressor  Disposition: Remain inpatient, if improvement continues will DC home tomorrow  Code Status: Full code  DVT Prophylaxis: Subcutaneous Lovenox.  Maretta Bees, MD. 09/12/2011, 6:00 PM

## 2011-09-13 ENCOUNTER — Inpatient Hospital Stay (HOSPITAL_COMMUNITY): Payer: Non-veteran care

## 2011-09-13 LAB — LIPASE, BLOOD: Lipase: 69 U/L — ABNORMAL HIGH (ref 11–59)

## 2011-09-13 LAB — BASIC METABOLIC PANEL
CO2: 20 mEq/L (ref 19–32)
Calcium: 8.9 mg/dL (ref 8.4–10.5)
GFR calc Af Amer: >90 mL/min (ref >90)
Sodium: 137 mEq/L (ref 135–145)

## 2011-09-13 NOTE — Progress Notes (Signed)
PATIENT DETAILS Name: Ross [name] Age: 45 y.o. Sex: male Date of Birth: 1966-03-14 Admit Date: 09/11/2011 PCP:No primary provider on file.  Subjective: Feeling much better, complaining of bilateral flank pain. Looks a lot better.   Objective: Vital signs in last 24 hours: Filed Vitals:   09/12/11 2201 09/13/11 0223 09/13/11 0630 09/13/11 1300  BP: 150/89 136/86 138/84 124/81  Pulse: 84 67 79 81  Temp: 100.9 F (38.3 C) 99.9 F (37.7 C) 99.4 F (37.4 C) 98.9 F (37.2 C)  TempSrc:    Oral  Resp: 19 18 18 18   Height:      Weight:      SpO2: 98% 96% 98% 99%    Weight change:   Body mass index is 34.44 kg/(m^2).  Intake/Output from previous day:  Intake/Output Summary (Last 24 hours) at 09/13/11 1516 Last data filed at 09/13/11 0700  Gross per 24 hour  Intake   1675 ml  Output    700 ml  Net    975 ml    PHYSICAL EXAM: Gen Exam: Awake and alert with clear speech.   Neck: Supple, No JVD.   Chest: B/L Clear.  No added sounds  CVS: S1 S2 Regular, no murmurs.  Abdomen: soft, BS +, non tender, non distended.  Extremities: no edema, lower extremities warm to touch. Musculoskeletal : No spinal or midline tenderness in the back. CVA area is not ender  Neurologic: Non Focal.  No sensory deficits as well  Skin: No Rash.   Wounds: N/A.    CONSULTS:  none  LAB RESULTS: CBC  Lab 09/12/11 0806 09/11/11 1416 09/11/11 0640  WBC 7.1 7.0 8.6  HGB 10.9* 11.2* 12.6*  HCT 32.0* 32.7* 37.2*  PLT 248 236 290  MCV 87.2 87.7 87.1  MCH 29.7 30.0 29.5  MCHC 34.1 34.3 33.9  RDW 13.3 13.5 13.5  LYMPHSABS -- -- 1.6  MONOABS -- -- 0.7  EOSABS -- -- 0.1  BASOSABS -- -- 0.0  BANDABS -- -- --    Chemistries   Lab 09/13/11 0620 09/12/11 0806 09/11/11 1416 09/11/11 0640  NA 137 136 -- 134*  K 3.7 3.3* -- 3.9  CL 105 104 -- 101  CO2 20 20 -- 19  GLUCOSE 92 199* -- 120*  BUN 4* 6 -- 5*  CREATININE 0.83 0.91 1.01 0.78  CALCIUM 8.9 8.9 -- 10.0  MG -- -- -- --     GFR Estimated Creatinine Clearance: 141.9 ml/min (by C-G formula based on Cr of 0.83).  Coagulation profile No results found for this basename: INR:5,PROTIME:5 in the last 168 hours  Cardiac Enzymes No results found for this basename: CK:3,CKMB:3,TROPONINI:3,MYOGLOBIN:3 in the last 168 hours  No results found for this basename: POCBNP:3 in the last 168 hours No results found for this basename: DDIMER:2 in the last 72 hours No results found for this basename: HGBA1C:2 in the last 72 hours No results found for this basename: CHOL:2,HDL:2,LDLCALC:2,TRIG:2,CHOLHDL:2,LDLDIRECT:2 in the last 72 hours No results found for this basename: TSH,T4TOTAL,FREET3,T3FREE,THYROIDAB in the last 72 hours No results found for this basename: VITAMINB12:2,FOLATE:2,FERRITIN:2,TIBC:2,IRON:2,RETICCTPCT:2 in the last 72 hours  Basename 09/13/11 0620 09/11/11 0640  LIPASE 69* 111*  AMYLASE -- --    Urine Studies No results found for this basename: UACOL:2,UAPR:2,USPG:2,UPH:2,UTP:2,UGL:2,UKET:2,UBIL:2,UHGB:2,UNIT:2,UROB:2,ULEU:2,UEPI:2,UWBC:2,URBC:2,UBAC:2,CAST:2,CRYS:2,UCOM:2,BILUA:2 in the last 72 hours  MICROBIOLOGY: No results found for this or any previous visit (from the past 240 hour(s)).  RADIOLOGY STUDIES/RESULTS: Dg Chest 2 View  09/11/2011  *RADIOLOGY REPORT*  Clinical Data: Right upper abdominal  pain, cough, chest pain  CHEST - 2 VIEW  Comparison: 09/03/2011  Findings: Patchy airspace opacities in basilar segments of the right lower lobe, slightly increased since previous exam.  Left lung clear.  No definite effusion.  Heart size upper limits normal. Relatively low lung volumes.  Regional bones unremarkable.  IMPRESSION:  1.  Some interval increase in patchy right lower lobe airspace disease.  Original Report Authenticated By: Osa Craver, M.D.   Dg Chest 2 View  09/03/2011  *RADIOLOGY REPORT*  Clinical Data: Shortness of breath  CHEST - 2 VIEW  Comparison: 08/29/2011, CT same date   Findings: Patchy right lower lobe airspace disease is noted with trace right pleural effusion.  Heart size is normal. Minimal left lower lobe airspace opacity is again noted.  No acute osseous finding.  Retained contrast noted within partially visualized nondilated colon.  IMPRESSION: Right greater than left lower lobe airspace disease which could represent pneumonia, less likely focal atelectasis.  Original Report Authenticated By: Harrel Lemon, M.D.   Ct Head Wo Contrast  08/24/2011  *RADIOLOGY REPORT*  Clinical Data: Seizure.  Cardiac arrest.  Evaluate for infarct.  CT HEAD WITHOUT CONTRAST  Technique:  Contiguous axial images were obtained from the base of the skull through the vertex without contrast.  Comparison: None.  Findings: There is no evidence for acute hemorrhage, hydrocephalus, mass lesion, or abnormal extra-axial fluid collection.  No definite CT evidence for acute infarction.  The visualized paranasal sinuses and mastoid air cells are clear.  IMPRESSION: Normal exam.  Original Report Authenticated By: ERIC A. MANSELL, M.D.   [name] Wo Contrast  08/27/2011  *RADIOLOGY REPORT*  Clinical Data: Seizure.  The patient recently had a large dose of antihistamines.  MRI HEAD WITHOUT AND WITH CONTRAST  Technique:  Multiplanar, multiecho pulse sequences of the brain and surrounding structures were obtained according to standard protocol without and with intravenous contrast  Contrast: 20mL MULTIHANCE GADOBENATE DIMEGLUMINE 529 MG/ML IV SOLN  Comparison: CT head without contrast 08/24/2011.  Findings: No acute infarct, hemorrhage, mass lesion is present. Mild periventricular white matter signal is noted.  Three or four punctate subcortical hyperdensities are seen as well.  There is no enhancement associated with these lesions.  There is an area of linear enhancement involving the right parietal lobe, compatible with a benign venous angioma.  No other pathologic enhancement is evident.  Flow is  present in the major intracranial arteries.  The globes orbits are intact.  The paranasal sinuses are clear.  Fluid is present in the mastoid air cells bilaterally.  No obstructing nasopharyngeal lesion is evident.  Dedicated imaging of the hippocampal structures demonstrates symmetric size and signal.  IMPRESSION:  1.  No acute or focal abnormality to explain seizures. 2.  Minimal white matter changes slightly advanced for age. The finding is nonspecific but can be seen in the setting of chronic microvascular ischemia, a demyelinating process such as multiple sclerosis, vasculitis, complicated migraine headaches, or as the sequelae of a prior infectious or inflammatory process. 3.  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 4.  Bilateral mastoid effusions are likely related to the patient's intubated status.  Original Report Authenticated By: Jamesetta Orleans. MATTERN, M.D.   US Abdomen Complete  09/03/2011  *RADIOLOGY REPORT*  Clinical Data:  Abdominal pain.  COMPLETE ABDOMINAL ULTRASOUND  Comparison:  None.  Findings:  Gallbladder:  No gallstones, gallbladder wall thickening, or pericholecystic fluid. The gallbladder is incompletely distended.  Common bile duct:  Measures 0.4 cm.  Liver:  The liver demonstrates increased echogenicity consistent with fatty change.  No focal lesion or intrahepatic biliary ductal dilatation.  IVC:  Appears normal.  Pancreas:  No focal abnormality seen.  Spleen:  Measures 6.1 cm and appears normal.  Right Kidney:  Measures 12.1 cm and appears normal.  Left Kidney:  Measures 12.0 cm and appears normal.  Abdominal aorta:  No aneurysm identified.  IMPRESSION: Negative for gallstones or acute finding.  Fatty infiltration of the liver.  Original Report Authenticated By: Bernadene Bell. Maricela Curet, M.D.   Ct Abdomen Pelvis W Contrast  09/03/2011  *RADIOLOGY REPORT*  Clinical Data: Acute pancreatitis, right upper abdominal pain, hypertension  CT ABDOMEN AND PELVIS  WITH CONTRAST  Technique:  Multidetector CT imaging of the abdomen and pelvis was performed following the standard protocol during bolus administration of intravenous contrast.  Contrast: OMNIPAQUE IOHEXOL 300 MG/ML IV SOLN  Comparison: Ultrasound from earlier the same day  Findings: There is a tiny right pleural effusion.  There is airspace consolidation posteriorly in both visualized lung bases, more extensive on the right than left.  Unremarkable liver, gallbladder, spleen and accessory splenule, adrenal glands, pancreas, kidneys, abdominal aorta. There are mild inflammatory changes in the retroperitoneum posterior to the pancreatic head and body.  Stomach and small bowel are nondilated. Normal appendix.  Colon is nondistended with a few scattered descending and sigmoid diverticula; no adjacent inflammatory or edematous change.  Urinary bladder is incompletely distended.  Left pelvic phleboliths.  No ascites.  No free air.  Portal vein patent. No adenopathy localized. No hydronephrosis.  Degenerative disc disease L4-5 and L5-S1.  IMPRESSION:  1.  Mild peripancreatic retroperitoneal inflammatory/edematous changes. 2.  Bilateral lower lobe airspace consolidation, right worse than left, suggesting pneumonia. 3.  Small right pleural effusion.  Original Report Authenticated By: Osa Craver, M.D.   Dg Chest Portable 1 View  08/29/2011  *RADIOLOGY REPORT*  Clinical Data: Check ET tube placement, SOB  PORTABLE CHEST - 1 VIEW  Comparison: 08/28/2011  Findings: No ET tube is visualized.  There is a right arm PICC line with tip in the cavoatrial junction.  Heart size is normal.  Left upper lobe perihilar opacity has resolved from previous exam.  No new findings.  IMPRESSION:  1.  Interval resolution of left upper lobe perihilar opacity.  Original Report Authenticated By: Rosealee Albee, M.D.   Dg Chest Portable 1 View  08/28/2011  *RADIOLOGY REPORT*  Clinical Data: Checked ETT  PORTABLE CHEST - 1  VIEW  Comparison: 08/27/2011  Findings: Interval extubation.  Left mid lung opacity, suspicious for lingular pneumonia. No pleural effusion or pneumothorax.  Stable right IJ venous catheter.  The heart is top normal in size.  IMPRESSION: Interval extubation.  Left mid lung opacity, suspicious for lingular pneumonia.  Original Report Authenticated By: Charline Bills, M.D.   Dg Chest Portable 1 View  08/27/2011  *RADIOLOGY REPORT*  Clinical Data: Evaluate ET tube placement  PORTABLE CHEST - 1 VIEW  Comparison: 08/26/2011  Findings: There is a right subclavian catheter with tip in the SVC.  The heart size is mildly enlarged.  No pleural effusions identified.  Bilateral lower lobe predominant airspace opacities have improved from previous exam.  IMPRESSION:  1.  Interval improvement in aeration to the lungs compared with previous exam.  Original Report Authenticated By: Rosealee Albee, M.D.   Dg Chest Portable 1 View  08/26/2011  *RADIOLOGY REPORT*  Clinical  Data: Check endotracheal tube after placement.  PORTABLE CHEST - 1 VIEW  Comparison: 08/25/2011.  Findings: Endotracheal tube tip 11 cm above the carina at the lower neck/thoracic inlet junction.  Correlation with breath sounds recommended to confirm this is within the trachea and not in the esophagus.  Nasogastric tube curled within the stomach.  The tip is not included present exam.  Right central line tip projects over the mid to distal superior vena cava level.  No gross pneumothorax.  Asymmetric pulmonary edema most notable perihilar region greater on the right has improved slightly since the prior exam.  Question pulmonary edema versus infectious infiltrate.  IMPRESSION: Endotracheal tube tip 11 cm above the carina at the lower neck/thoracic inlet junction.  Correlation with breath sounds recommended to confirm this is within the trachea and not in the esophagus.  Asymmetric pulmonary edema most notable perihilar region greater on the right has  improved slightly since the prior exam.  Question pulmonary edema versus infectious infiltrate.  Original Report Authenticated By: Fuller Canada, M.D.   Dg Chest Portable 1 View  08/25/2011  *RADIOLOGY REPORT*  Clinical Data: Check endotracheal tube, shortness of breath  PORTABLE CHEST - 1 VIEW  Comparison: 08/24/2011  Findings: Endotracheal tube at the thoracic inlet, terminating 7 cm above the carina.  Enteric tube looped in the stomach.  Right subclavian venous catheter terminating at the cavoatrial junction.  Patchy bilateral airspace opacities, possibly reflecting edema and/or infection.  No pleural effusion or pneumothorax.  The heart is normal in size.  IMPRESSION: Endotracheal tube  terminates 10 cm above the carina.  Stable support apparatus, as above.  Patchy bilateral airspace opacities, possibly reflecting edema and/or infection.  Original Report Authenticated By: Charline Bills, M.D.   Dg Chest Portable 1 View  08/24/2011  *RADIOLOGY REPORT*  Clinical Data: Respiratory distress  PORTABLE CHEST - 1 VIEW  Comparison: 08/24/2011 8:50 hours  Findings: Under aerated lungs.  Diffuse bilateral airspace disease is worse.  Tubular devices are unchanged.  No pneumothorax.  Normal heart size.  IMPRESSION: Worsening diffuse bilateral airspace disease.  Original Report Authenticated By: Donavan Burnet, M.D.   Dg Chest Portable 1 View  08/24/2011  *RADIOLOGY REPORT*  Clinical Data: Evaluate endotracheal tube placement  PORTABLE CHEST - 1 VIEW  Comparison: 08/23/2011; 09/15/2006  Findings:  Grossly unchanged cardiac silhouette and mediastinal contours. Endotracheal tube overlies tracheal air column with tip superior to the carina.  Enteric tube terminates into the left hemidiaphragm. Unchanged positioning of a right subclavian approach central venous catheter with tip overlying the mid SVC.  Improved aeration of the lungs with persistent perihilar heterogeneous opacities.  Mild cephalization of flow  without frank evidence of pulmonary edema. No definite pneumothorax or pleural effusion.  Grossly unchanged bones.  IMPRESSION: 1.  Appropriately positioned support apparatus as above.  No definite pneumothorax. 2.  Improved aeration of the lungs with persistent perihilar opacities, possibly atelectasis though infection is not excluded.  Original Report Authenticated By: Waynard Reeds, M.D.   Dg Chest Portable 1 View  08/23/2011  *RADIOLOGY REPORT*  Clinical Data: Respiratory difficulty  PORTABLE CHEST - 1 VIEW  Comparison: 09/15/2006  Findings: Low lung volumes.  Extensive bilateral diffuse airspace disease with a central distribution.  Endotracheal tube placed. Tip is 4.2 cm from carina.  Right subclavian central venous catheter tip at the cavoatrial junction.  No pneumothorax.  IMPRESSION: Endotracheal tube.  Right subclavian central venous catheter tip at the cavoatrial junction and no pneumothorax per  Bilateral diffuse airspace disease.  Original Report Authenticated By: Donavan Burnet, M.D.    MEDICATIONS: Scheduled Meds:    . ampicillin-sulbactam (UNASYN) IV  3 g Intravenous Q6H  . enoxaparin  40 mg Subcutaneous Q24H  . guaiFENesin  600 mg Oral BID  . metoprolol tartrate  25 mg Oral BID  . phenytoin  300 mg Oral Daily  . potassium chloride  40 mEq Oral Once   Continuous Infusions:    . DISCONTD: sodium chloride 75 mL/hr at 09/13/11 0055   PRN Meds:.acetaminophen, acetaminophen, HYDROcodone-acetaminophen, morphine, senna  Antibiotics: Anti-infectives     Start     Dose/Rate Route Frequency Ordered Stop   09/11/11 1800   Ampicillin-Sulbactam (UNASYN) 3 g in sodium chloride 0.9 % 100 mL IVPB        3 g 100 mL/hr over 60 Minutes Intravenous Every 6 hours 09/11/11 1415     09/11/11 0915   vancomycin (VANCOCIN) IVPB 1000 mg/200 mL premix        1,000 mg 200 mL/hr over 60 Minutes Intravenous  Once 09/11/11 0910 09/11/11 1225   09/11/11 0915   piperacillin-tazobactam (ZOSYN) IVPB  3.375 g        3.375 g 12.5 mL/hr over 240 Minutes Intravenous  Once 09/11/11 0910 09/11/11 1425          Assessment/Plan: Patient Active Hospital Problem List: Aspiration pneumonia    Assessment: Better, however continues to have some low-grade fever.    Plan: Continue with Unasyn. We'll transition to Augmentin on discharge.   Seizure    Assessment: Seizure free, this is a chronic issue.    Plan: Continue with Dilantin.   RUQ abdominal pain   Assessment: resolved.    Plan: Monitor, he is easy tolerating diet with no vomiting.  SOB (shortness of breath)    Assessment: resolved   Plan:  monitor   HTN (hypertension)    Assessment: Controlled    Plan: Continue with Lopressor  Back pain   Assessment: Is mostly flank area. No midline tenderness in the lumbar or thoracic region.   Plan: We'll get a plain lumbar spine x-ray.  Disposition: Remain inpatient,  likely DC home tomorrow   Code Status: Full code  DVT Prophylaxis: Subcutaneous Lovenox.  Maretta Bees, MD. 09/13/2011, 3:16 PM

## 2011-09-14 MED ORDER — HYDROCODONE-ACETAMINOPHEN 5-325 MG PO TABS: 1.0000 | ORAL_TABLET | Freq: Three times a day (TID) | ORAL | Status: AC | PRN

## 2011-09-14 MED ORDER — AMOXICILLIN-POT CLAVULANATE 875-125 MG PO TABS: 1.0000 | ORAL_TABLET | Freq: Two times a day (BID) | ORAL | Status: AC

## 2011-09-14 NOTE — H&P (Deleted)
PATIENT DETAILS Name: Ross [name] Age: 45 y.o. Sex: male Date of Birth: 1966/05/18 MRN: [id number]. Admit Date: 09/11/2011 Admitting Physician: Chaya Jan PCP:No primary provider on file.  PRIMARY DISCHARGE DIAGNOSIS:  Principal Problem:  *Aspiration pneumonia Active Problems:  Seizure  RUQ abdominal pain  SOB (shortness of breath)  HTN (hypertension)      PAST MEDICAL HISTORY: Past Medical History  Diagnosis Date  . Sleep apnea     had surgery to correct it  . Mental disorder     PTSD  . Seizures     currently dx with seizures  . Cardiac arrest 08/23/2011  . Pneumonia 09/04/2011  . Pancreatitis   . SOB (shortness of breath) 09/11/2011  . Hypertension   . Anxiety     DISCHARGE MEDICATIONS: Current Discharge Medication List    START taking these medications   Details  amoxicillin-clavulanate (AUGMENTIN) 875-125 MG per tablet Take 1 tablet by mouth 2 (two) times daily. Qty: 6 tablet, Refills: 0    HYDROcodone-acetaminophen (NORCO) 5-325 MG per tablet Take 1 tablet by mouth every 8 (eight) hours as needed. Qty: 15 tablet, Refills: 0      CONTINUE these medications which have NOT CHANGED   Details  cetaphil (CETAPHIL) cream Apply 1 application topically daily as needed. Apply after bathing    guaiFENesin (MUCINEX) 600 MG 12 hr tablet Take 1 tablet (600 mg total) by mouth 2 (two) times daily.    magnesium oxide (MAG-OX) 400 MG tablet Take 1 tablet (400 mg total) by mouth 2 (two) times daily. Qty: 30 tablet, Refills: 0    metoprolol tartrate (LOPRESSOR) 25 MG tablet Take 1 tablet (25 mg total) by mouth 2 (two) times daily. Qty: 60 tablet, Refills: 0    phenytoin (DILANTIN) 300 MG ER capsule Take 1 capsule (300 mg total) by mouth daily. Qty: 30 capsule, Refills: 0    potassium chloride (K-DUR) 10 MEQ tablet Take 1 tablet (10 mEq total) by mouth daily. Qty: 20 tablet, Refills: 0         BRIEF HPI:  See H&P, Labs, Consult and Test  reports for all details in brief, patient was admitted for **  CONSULTATIONS:   none  PERTINENT RADIOLOGIC STUDIES: Dg Chest 2 View  09/11/2011  *RADIOLOGY REPORT*  Clinical Data: Right upper abdominal pain, cough, chest pain  CHEST - 2 VIEW  Comparison: 09/03/2011  Findings: Patchy airspace opacities in basilar segments of the right lower lobe, slightly increased since previous exam.  Left lung clear.  No definite effusion.  Heart size upper limits normal. Relatively low lung volumes.  Regional bones unremarkable.  IMPRESSION:  1.  Some interval increase in patchy right lower lobe airspace disease.  Original Report Authenticated By: Osa Craver, M.D.   Dg Chest 2 View  09/03/2011  *RADIOLOGY REPORT*  Clinical Data: Shortness of breath  CHEST - 2 VIEW  Comparison: 08/29/2011, CT same date  Findings: Patchy right lower lobe airspace disease is noted with trace right pleural effusion.  Heart size is normal. Minimal left lower lobe airspace opacity is again noted.  No acute osseous finding.  Retained contrast noted within partially visualized nondilated colon.  IMPRESSION: Right greater than left lower lobe airspace disease which could represent pneumonia, less likely focal atelectasis.  Original Report Authenticated By: Harrel Lemon, M.D.   Dg Lumbar Spine 2-3 Views  09/13/2011  *RADIOLOGY REPORT*  Clinical Data: Low back pain on the right.  No known injury.  LUMBAR  SPINE - 2-3 VIEW  Comparison: 09/03/2011 CT.  Findings: Anterior osteophyte T11-12 and T12-L1.  Disc space narrowing L3-4, L4-5 and L5 S1. On the recent CT, foraminal narrowing bilaterally L4-5 and more notable L5-S1 level.  At the L5-S1 level disc osteophyte slightly greater to the right. If further investigation is clinically desired MR imaging can be obtained.  IMPRESSION: Degenerative changes most notable L4-5 and L5-S1.  Please see above.  Original Report Authenticated By: Fuller Canada, M.D.   Ct Head Wo  Contrast  08/24/2011  *RADIOLOGY REPORT*  Clinical Data: Seizure.  Cardiac arrest.  Evaluate for infarct.  CT HEAD WITHOUT CONTRAST  Technique:  Contiguous axial images were obtained from the base of the skull through the vertex without contrast.  Comparison: None.  Findings: There is no evidence for acute hemorrhage, hydrocephalus, mass lesion, or abnormal extra-axial fluid collection.  No definite CT evidence for acute infarction.  The visualized paranasal sinuses and mastoid air cells are clear.  IMPRESSION: Normal exam.  Original Report Authenticated By: ERIC A. MANSELL, M.D.   [name] Wo Contrast  08/27/2011  *RADIOLOGY REPORT*  Clinical Data: Seizure.  The patient recently had a large dose of antihistamines.  MRI HEAD WITHOUT AND WITH CONTRAST  Technique:  Multiplanar, multiecho pulse sequences of the brain and surrounding structures were obtained according to standard protocol without and with intravenous contrast  Contrast: 20mL MULTIHANCE GADOBENATE DIMEGLUMINE 529 MG/ML IV SOLN  Comparison: CT head without contrast 08/24/2011.  Findings: No acute infarct, hemorrhage, mass lesion is present. Mild periventricular white matter signal is noted.  Three or four punctate subcortical hyperdensities are seen as well.  There is no enhancement associated with these lesions.  There is an area of linear enhancement involving the right parietal lobe, compatible with a benign venous angioma.  No other pathologic enhancement is evident.  Flow is present in the major intracranial arteries.  The globes orbits are intact.  The paranasal sinuses are clear.  Fluid is present in the mastoid air cells bilaterally.  No obstructing nasopharyngeal lesion is evident.  Dedicated imaging of the hippocampal structures demonstrates symmetric size and signal.  IMPRESSION:  1.  No acute or focal abnormality to explain seizures. 2.  Minimal white matter changes slightly advanced for age. The finding is nonspecific but can be seen  in the setting of chronic microvascular ischemia, a demyelinating process such as multiple sclerosis, vasculitis, complicated migraine headaches, or as the sequelae of a prior infectious or inflammatory process. 3.  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 4.  Bilateral mastoid effusions are likely related to the patient's intubated status.  Original Report Authenticated By: Jamesetta Orleans. MATTERN, M.D.   US Abdomen Complete  09/03/2011  *RADIOLOGY REPORT*  Clinical Data:  Abdominal pain.  COMPLETE ABDOMINAL ULTRASOUND  Comparison:  None.  Findings:  Gallbladder:  No gallstones, gallbladder wall thickening, or pericholecystic fluid. The gallbladder is incompletely distended.  Common bile duct:  Measures 0.4 cm.  Liver:  The liver demonstrates increased echogenicity consistent with fatty change.  No focal lesion or intrahepatic biliary ductal dilatation.  IVC:  Appears normal.  Pancreas:  No focal abnormality seen.  Spleen:  Measures 6.1 cm and appears normal.  Right Kidney:  Measures 12.1 cm and appears normal.  Left Kidney:  Measures 12.0 cm and appears normal.  Abdominal aorta:  No aneurysm identified.  IMPRESSION: Negative for gallstones or acute finding.  Fatty infiltration of the liver.  Original Report  Authenticated By: Bernadene Bell. Maricela Curet, M.D.   Ct Abdomen Pelvis W Contrast  09/03/2011  *RADIOLOGY REPORT*  Clinical Data: Acute pancreatitis, right upper abdominal pain, hypertension  CT ABDOMEN AND PELVIS WITH CONTRAST  Technique:  Multidetector CT imaging of the abdomen and pelvis was performed following the standard protocol during bolus administration of intravenous contrast.  Contrast: OMNIPAQUE IOHEXOL 300 MG/ML IV SOLN  Comparison: Ultrasound from earlier the same day  Findings: There is a tiny right pleural effusion.  There is airspace consolidation posteriorly in both visualized lung bases, more extensive on the right than left.  Unremarkable liver, gallbladder,  spleen and accessory splenule, adrenal glands, pancreas, kidneys, abdominal aorta. There are mild inflammatory changes in the retroperitoneum posterior to the pancreatic head and body.  Stomach and small bowel are nondilated. Normal appendix.  Colon is nondistended with a few scattered descending and sigmoid diverticula; no adjacent inflammatory or edematous change.  Urinary bladder is incompletely distended.  Left pelvic phleboliths.  No ascites.  No free air.  Portal vein patent. No adenopathy localized. No hydronephrosis.  Degenerative disc disease L4-5 and L5-S1.  IMPRESSION:  1.  Mild peripancreatic retroperitoneal inflammatory/edematous changes. 2.  Bilateral lower lobe airspace consolidation, right worse than left, suggesting pneumonia. 3.  Small right pleural effusion.  Original Report Authenticated By: Osa Craver, M.D.   Dg Chest Portable 1 View  08/29/2011  *RADIOLOGY REPORT*  Clinical Data: Check ET tube placement, SOB  PORTABLE CHEST - 1 VIEW  Comparison: 08/28/2011  Findings: No ET tube is visualized.  There is a right arm PICC line with tip in the cavoatrial junction.  Heart size is normal.  Left upper lobe perihilar opacity has resolved from previous exam.  No new findings.  IMPRESSION:  1.  Interval resolution of left upper lobe perihilar opacity.  Original Report Authenticated By: Rosealee Albee, M.D.   Dg Chest Portable 1 View  08/28/2011  *RADIOLOGY REPORT*  Clinical Data: Checked ETT  PORTABLE CHEST - 1 VIEW  Comparison: 08/27/2011  Findings: Interval extubation.  Left mid lung opacity, suspicious for lingular pneumonia. No pleural effusion or pneumothorax.  Stable right IJ venous catheter.  The heart is top normal in size.  IMPRESSION: Interval extubation.  Left mid lung opacity, suspicious for lingular pneumonia.  Original Report Authenticated By: Charline Bills, M.D.   Dg Chest Portable 1 View  08/27/2011  *RADIOLOGY REPORT*  Clinical Data: Evaluate ET tube placement   PORTABLE CHEST - 1 VIEW  Comparison: 08/26/2011  Findings: There is a right subclavian catheter with tip in the SVC.  The heart size is mildly enlarged.  No pleural effusions identified.  Bilateral lower lobe predominant airspace opacities have improved from previous exam.  IMPRESSION:  1.  Interval improvement in aeration to the lungs compared with previous exam.  Original Report Authenticated By: Rosealee Albee, M.D.   Dg Chest Portable 1 View  08/26/2011  *RADIOLOGY REPORT*  Clinical Data: Check endotracheal tube after placement.  PORTABLE CHEST - 1 VIEW  Comparison: 08/25/2011.  Findings: Endotracheal tube tip 11 cm above the carina at the lower neck/thoracic inlet junction.  Correlation with breath sounds recommended to confirm this is within the trachea and not in the esophagus.  Nasogastric tube curled within the stomach.  The tip is not included present exam.  Right central line tip projects over the mid to distal superior vena cava level.  No gross pneumothorax.  Asymmetric pulmonary edema most notable perihilar region greater on  the right has improved slightly since the prior exam.  Question pulmonary edema versus infectious infiltrate.  IMPRESSION: Endotracheal tube tip 11 cm above the carina at the lower neck/thoracic inlet junction.  Correlation with breath sounds recommended to confirm this is within the trachea and not in the esophagus.  Asymmetric pulmonary edema most notable perihilar region greater on the right has improved slightly since the prior exam.  Question pulmonary edema versus infectious infiltrate.  Original Report Authenticated By: Fuller Canada, M.D.   Dg Chest Portable 1 View  08/25/2011  *RADIOLOGY REPORT*  Clinical Data: Check endotracheal tube, shortness of breath  PORTABLE CHEST - 1 VIEW  Comparison: 08/24/2011  Findings: Endotracheal tube at the thoracic inlet, terminating 7 cm above the carina.  Enteric tube looped in the stomach.  Right subclavian venous catheter  terminating at the cavoatrial junction.  Patchy bilateral airspace opacities, possibly reflecting edema and/or infection.  No pleural effusion or pneumothorax.  The heart is normal in size.  IMPRESSION: Endotracheal tube  terminates 10 cm above the carina.  Stable support apparatus, as above.  Patchy bilateral airspace opacities, possibly reflecting edema and/or infection.  Original Report Authenticated By: Charline Bills, M.D.   Dg Chest Portable 1 View  08/24/2011  *RADIOLOGY REPORT*  Clinical Data: Respiratory distress  PORTABLE CHEST - 1 VIEW  Comparison: 08/24/2011 8:50 hours  Findings: Under aerated lungs.  Diffuse bilateral airspace disease is worse.  Tubular devices are unchanged.  No pneumothorax.  Normal heart size.  IMPRESSION: Worsening diffuse bilateral airspace disease.  Original Report Authenticated By: Donavan Burnet, M.D.   Dg Chest Portable 1 View  08/24/2011  *RADIOLOGY REPORT*  Clinical Data: Evaluate endotracheal tube placement  PORTABLE CHEST - 1 VIEW  Comparison: 08/23/2011; 09/15/2006  Findings:  Grossly unchanged cardiac silhouette and mediastinal contours. Endotracheal tube overlies tracheal air column with tip superior to the carina.  Enteric tube terminates into the left hemidiaphragm. Unchanged positioning of a right subclavian approach central venous catheter with tip overlying the mid SVC.  Improved aeration of the lungs with persistent perihilar heterogeneous opacities.  Mild cephalization of flow without frank evidence of pulmonary edema. No definite pneumothorax or pleural effusion.  Grossly unchanged bones.  IMPRESSION: 1.  Appropriately positioned support apparatus as above.  No definite pneumothorax. 2.  Improved aeration of the lungs with persistent perihilar opacities, possibly atelectasis though infection is not excluded.  Original Report Authenticated By: Waynard Reeds, M.D.   Dg Chest Portable 1 View  08/23/2011  *RADIOLOGY REPORT*  Clinical Data: Respiratory  difficulty  PORTABLE CHEST - 1 VIEW  Comparison: 09/15/2006  Findings: Low lung volumes.  Extensive bilateral diffuse airspace disease with a central distribution.  Endotracheal tube placed. Tip is 4.2 cm from carina.  Right subclavian central venous catheter tip at the cavoatrial junction.  No pneumothorax.  IMPRESSION: Endotracheal tube.  Right subclavian central venous catheter tip at the cavoatrial junction and no pneumothorax per  Bilateral diffuse airspace disease.  Original Report Authenticated By: Donavan Burnet, M.D.     PERTINENT LAB RESULTS: CBC:  Basename 09/12/11 0806 09/11/11 1416  WBC 7.1 7.0  HGB 10.9* 11.2*  HCT 32.0* 32.7*  PLT 248 236   CMET CMP     Component Value Date/Time   NA 137 09/13/2011 0620   K 3.7 09/13/2011 0620   CL 105 09/13/2011 0620   CO2 20 09/13/2011 0620   GLUCOSE 92 09/13/2011 0620   BUN 4* 09/13/2011 9147  CREATININE 0.83 09/13/2011 0620   CALCIUM 8.9 09/13/2011 0620   PROT 8.0 09/11/2011 0640   ALBUMIN 3.0* 09/11/2011 0640   AST 21 09/11/2011 0640   ALT 26 09/11/2011 0640   ALKPHOS 128* 09/11/2011 0640   BILITOT 0.3 09/11/2011 0640   GFRNONAA >90 09/13/2011 0620   GFRAA >90 09/13/2011 0620    GFR Estimated Creatinine Clearance: 141.9 ml/min (by C-G formula based on Cr of 0.83).  Basename 09/13/11 0620  LIPASE 69*  AMYLASE --   No results found for this basename: CKTOTAL:3,CKMB:3,CKMBINDEX:3,TROPONINI:3 in the last 72 hours No results found for this basename: POCBNP:3 in the last 72 hours No results found for this basename: DDIMER:2 in the last 72 hours No results found for this basename: HGBA1C:2 in the last 72 hours No results found for this basename: CHOL:2,HDL:2,LDLCALC:2,TRIG:2,CHOLHDL:2,LDLDIRECT:2 in the last 72 hours No results found for this basename: TSH,T4TOTAL,FREET3,T3FREE,THYROIDAB in the last 72 hours No results found for this basename: VITAMINB12:2,FOLATE:2,FERRITIN:2,TIBC:2,IRON:2,RETICCTPCT:2 in the last 72  hours Coags: No results found for this basename: PT:2,INR:2 in the last 72 hours Microbiology: No results found for this or any previous visit (from the past 240 hour(s)).   BRIEF HOSPITAL COURSE:   Principal Problem:  *Aspiration pneumonia -Patient presented with Shortness of breath, nonproductive cough. He recently also had a episode of pancreatitis and before that he had a cardiac arrest secondary to torsades from anticholinergic effects of Benadryl.Given his recent history it was thought patient could possibly have aspiration pneumonia. He was admitted to the hospital, started on Unasyn.Over the past few days his cough and shortness of breath have almost resolved, he is significantly better and is requesting he be discharged home today.He has been afebrile overnight. He will be transitioned to augmentin for 3 more days.  Active Problems:  Seizure -stable, continue with dilantin   RUQ abdominal pain This resolved spontaneously. He is tolerating a diet with no difficulty. His abdomen is soft and not distended at all.   HTN (hypertension)   Continue with lopressor   TODAY-DAY OF DISCHARGE:  Subjective:   Windell Musson today has no headache,no chest abdominal pain,no new weakness tingling or numbness, feels much better wants to go home today. His back pain is significantly better today as well.  Objective:   Blood pressure 132/82, pulse 87, temperature 98.4 F (36.9 C), temperature source Oral, resp. rate 19, height 5\' 10"  (1.778 m), weight 108.863 kg (240 lb), SpO2 96.00%.  Intake/Output Summary (Last 24 hours) at 09/14/11 1250 Last data filed at 09/14/11 0531  Gross per 24 hour  Intake      0 ml  Output    550 ml  Net   -550 ml    Exam Awake Alert, Oriented *3, No new F.N deficits, Normal affect [place].AT,PERRAL Supple Neck,No JVD, No cervical lymphadenopathy appriciated.  Symmetrical Chest wall movement, Good air movement bilaterally, CTAB RRR,No Gallops,Rubs or new  Murmurs, No Parasternal Heave +ve B.Sounds, Abd Soft, Non tender, No organomegaly appriciated, No rebound -guarding or rigidity. No Cyanosis, Clubbing or edema, No new Rash or bruise  DISPOSITION: Home in stable condition  DISCHARGE INSTRUCTIONS:    Follow-up Information    Follow up with VARANASI,JAYADEEP S. on 09/23/2011. (previous appt at 2:30 pm. Please keep this appointment)    Contact information:   [address] [employer] [place] [address] [phone]       Follow up with [facility]. (MAKE APPT WITH PCP AND NEUROLOGIST IN  1-2 WEEKS)           Total Time spent on discharge equals 45 minutes.  SignedJeoffrey Massed 09/14/2011 12:50 PM

## 2011-09-23 NOTE — Discharge Summary (Signed)
PATIENT DETAILS Name: Ross [name] Age: 45 y.o. Sex: male Date of Birth: [birth year] MRN: [id number]. Admit Date: 09/11/2011 Admitting Physician: Chaya Jan PCP:No primary provider on file.  PRIMARY DISCHARGE DIAGNOSIS:  Principal Problem:  *Aspiration pneumonia Active Problems:  Seizure  RUQ abdominal pain  SOB (shortness of breath)  HTN (hypertension)      PAST MEDICAL HISTORY: Past Medical History  Diagnosis Date  . Sleep apnea     had surgery to correct it  . Mental disorder     PTSD  . Seizures     currently dx with seizures  . Cardiac arrest 08/23/2011  . Pneumonia 09/04/2011  . Pancreatitis   . SOB (shortness of breath) 09/11/2011  . Hypertension   . Anxiety     DISCHARGE MEDICATIONS: Discharge Medication List as of 09/14/2011  1:36 PM    START taking these medications   Details  amoxicillin-clavulanate (AUGMENTIN) 875-125 MG per tablet Take 1 tablet by mouth 2 (two) times daily., Starting 09/14/2011, Until Sat 09/24/11, Print    HYDROcodone-acetaminophen (NORCO) 5-325 MG per tablet Take 1 tablet by mouth every 8 (eight) hours as needed., Starting 09/14/2011, Until Sat 09/24/11, Print      CONTINUE these medications which have NOT CHANGED   Details  cetaphil (CETAPHIL) cream Apply 1 application topically daily as needed. Apply after bathing, Until Discontinued, Historical Med    guaiFENesin (MUCINEX) 600 MG 12 hr tablet Take 1 tablet (600 mg total) by mouth 2 (two) times daily., Starting 09/06/2011, Until Wed 09/05/12, OTC    magnesium oxide (MAG-OX) 400 MG tablet Take 1 tablet (400 mg total) by mouth 2 (two) times daily., Starting 09/01/2011, Until Fri 08/31/12, Print    metoprolol tartrate (LOPRESSOR) 25 MG tablet Take 1 tablet (25 mg total) by mouth 2 (two) times daily., Starting 09/01/2011, Until Fri 08/31/12, Print    phenytoin (DILANTIN) 300 MG ER capsule Take 1 capsule (300 mg total) by mouth daily., Starting 09/01/2011, Until Fri  08/31/12, Print    potassium chloride (K-DUR) 10 MEQ tablet Take 1 tablet (10 mEq total) by mouth daily., Starting 09/06/2011, Until Wed 09/05/12, Print         BRIEF HPI:  See H&P, Labs, Consult and Test reports for all details in brief, patient was admitted for **  CONSULTATIONS:   none  PERTINENT RADIOLOGIC STUDIES: Dg Chest 2 View  09/11/2011  *RADIOLOGY REPORT*  Clinical Data: Right upper abdominal pain, cough, chest pain  CHEST - 2 VIEW  Comparison: 09/03/2011  Findings: Patchy airspace opacities in basilar segments of the right lower lobe, slightly increased since previous exam.  Left lung clear.  No definite effusion.  [name] size upper limits normal. Relatively low lung volumes.  Regional bones unremarkable.  IMPRESSION:  1.  Some interval increase in patchy right lower lobe airspace disease.  Original Report Authenticated By: Osa Craver, M.D.   Dg Chest 2 View  09/03/2011  *RADIOLOGY REPORT*  Clinical Data: Shortness of breath  CHEST - 2 VIEW  Comparison: 08/29/2011, CT same date  Findings: Patchy right lower lobe airspace disease is noted with trace right pleural effusion.  [name] size is normal. Minimal left lower lobe airspace opacity is again noted.  No acute osseous finding.  Retained contrast noted within partially visualized nondilated colon.  IMPRESSION: Right greater than left lower lobe airspace disease which could represent pneumonia, less likely focal atelectasis.  Original Report Authenticated By: Harrel Lemon, M.D.   Dg Lumbar Spine 2-3  Views  09/13/2011  *RADIOLOGY REPORT*  Clinical Data: Low back pain on the right.  No known injury.  LUMBAR SPINE - 2-3 VIEW  Comparison: 09/03/2011 CT.  Findings: Anterior osteophyte T11-12 and T12-L1.  Disc space narrowing L3-4, L4-5 and L5 S1. On the recent CT, foraminal narrowing bilaterally L4-5 and more notable L5-S1 level.  At the L5-S1 level disc osteophyte slightly greater to the right. If further investigation  is clinically desired MR imaging can be obtained.  IMPRESSION: Degenerative changes most notable L4-5 and L5-S1.  Please see above.  Original Report Authenticated By: Fuller Canada, M.D.   Ct Head Wo Contrast  08/24/2011  *RADIOLOGY REPORT*  Clinical Data: Seizure.  Cardiac arrest.  Evaluate for infarct.  CT HEAD WITHOUT CONTRAST  Technique:  Contiguous axial images were obtained from the base of the skull through the vertex without contrast.  Comparison: None.  Findings: There is no evidence for acute hemorrhage, hydrocephalus, mass lesion, or abnormal extra-axial fluid collection.  No definite CT evidence for acute infarction.  The visualized paranasal sinuses and mastoid air cells are clear.  IMPRESSION: Normal exam.  Original Report Authenticated By: ERIC A. MANSELL, M.D.   [name] Wo Contrast  08/27/2011  *RADIOLOGY REPORT*  Clinical Data: Seizure.  The patient recently had a large dose of antihistamines.  MRI HEAD WITHOUT AND WITH CONTRAST  Technique:  Multiplanar, multiecho pulse sequences of the brain and surrounding structures were obtained according to standard protocol without and with intravenous contrast  Contrast: 20mL MULTIHANCE GADOBENATE DIMEGLUMINE 529 MG/ML IV SOLN  Comparison: CT head without contrast 08/24/2011.  Findings: No acute infarct, hemorrhage, mass lesion is present. Mild periventricular white matter signal is noted.  Three or four punctate subcortical hyperdensities are seen as well.  There is no enhancement associated with these lesions.  There is an area of linear enhancement involving the right parietal lobe, compatible with a benign venous angioma.  No other pathologic enhancement is evident.  Flow is present in the major intracranial arteries.  The globes orbits are intact.  The paranasal sinuses are clear.  Fluid is present in the mastoid air cells bilaterally.  No obstructing nasopharyngeal lesion is evident.  Dedicated imaging of the hippocampal structures  demonstrates symmetric size and signal.  IMPRESSION:  1.  No acute or focal abnormality to explain seizures. 2.  Minimal white matter changes slightly advanced for age. The finding is nonspecific but can be seen in the setting of chronic microvascular ischemia, a demyelinating process such as multiple sclerosis, vasculitis, complicated migraine headaches, or as the sequelae of a prior infectious or inflammatory process. 3.  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 4.  Bilateral mastoid effusions are likely related to the patient's intubated status.  Original Report Authenticated By: Jamesetta Orleans. MATTERN, M.D.   US Abdomen Complete  09/03/2011  *RADIOLOGY REPORT*  Clinical Data:  Abdominal pain.  COMPLETE ABDOMINAL ULTRASOUND  Comparison:  None.  Findings:  Gallbladder:  No gallstones, gallbladder wall thickening, or pericholecystic fluid. The gallbladder is incompletely distended.  Common bile duct:  Measures 0.4 cm.  Liver:  The liver demonstrates increased echogenicity consistent with fatty change.  No focal lesion or intrahepatic biliary ductal dilatation.  IVC:  Appears normal.  Pancreas:  No focal abnormality seen.  Spleen:  Measures 6.1 cm and appears normal.  Right Kidney:  Measures 12.1 cm and appears normal.  Left Kidney:  Measures 12.0 cm and appears normal.  Abdominal aorta:  No aneurysm identified.  IMPRESSION: Negative for gallstones or acute finding.  Fatty infiltration of the liver.  Original Report Authenticated By: Bernadene Bell. Maricela Curet, M.D.   Ct Abdomen Pelvis W Contrast  09/03/2011  *RADIOLOGY REPORT*  Clinical Data: Acute pancreatitis, right upper abdominal pain, hypertension  CT ABDOMEN AND PELVIS WITH CONTRAST  Technique:  Multidetector CT imaging of the abdomen and pelvis was performed following the standard protocol during bolus administration of intravenous contrast.  Contrast: OMNIPAQUE IOHEXOL 300 MG/ML IV SOLN  Comparison: Ultrasound from earlier  the same day  Findings: There is a tiny right pleural effusion.  There is airspace consolidation posteriorly in both visualized lung bases, more extensive on the right than left.  Unremarkable liver, gallbladder, spleen and accessory splenule, adrenal glands, pancreas, kidneys, abdominal aorta. There are mild inflammatory changes in the retroperitoneum posterior to the pancreatic head and body.  Stomach and small bowel are nondilated. Normal appendix.  Colon is nondistended with a few scattered descending and sigmoid diverticula; no adjacent inflammatory or edematous change.  Urinary bladder is incompletely distended.  Left pelvic phleboliths.  No ascites.  No free air.  Portal vein patent. No adenopathy localized. No hydronephrosis.  Degenerative disc disease L4-5 and L5-S1.  IMPRESSION:  1.  Mild peripancreatic retroperitoneal inflammatory/edematous changes. 2.  Bilateral lower lobe airspace consolidation, right worse than left, suggesting pneumonia. 3.  Small right pleural effusion.  Original Report Authenticated By: Osa Craver, M.D.   Dg Chest Portable 1 View  08/29/2011  *RADIOLOGY REPORT*  Clinical Data: Check ET tube placement, SOB  PORTABLE CHEST - 1 VIEW  Comparison: 08/28/2011  Findings: No ET tube is visualized.  There is a right arm PICC line with tip in the cavoatrial junction.  [name] size is normal.  Left upper lobe perihilar opacity has resolved from previous exam.  No new findings.  IMPRESSION:  1.  Interval resolution of left upper lobe perihilar opacity.  Original Report Authenticated By: Rosealee Albee, M.D.   Dg Chest Portable 1 View  08/28/2011  *RADIOLOGY REPORT*  Clinical Data: Checked ETT  PORTABLE CHEST - 1 VIEW  Comparison: 08/27/2011  Findings: Interval extubation.  Left mid lung opacity, suspicious for lingular pneumonia. No pleural effusion or pneumothorax.  Stable right IJ venous catheter.  The [name] is top normal in size.  IMPRESSION: Interval extubation.  Left mid  lung opacity, suspicious for lingular pneumonia.  Original Report Authenticated By: Charline Bills, M.D.   Dg Chest Portable 1 View  08/27/2011  *RADIOLOGY REPORT*  Clinical Data: Evaluate ET tube placement  PORTABLE CHEST - 1 VIEW  Comparison: 08/26/2011  Findings: There is a right subclavian catheter with tip in the SVC.  The [name] size is mildly enlarged.  No pleural effusions identified.  Bilateral lower lobe predominant airspace opacities have improved from previous exam.  IMPRESSION:  1.  Interval improvement in aeration to the lungs compared with previous exam.  Original Report Authenticated By: Rosealee Albee, M.D.   Dg Chest Portable 1 View  08/26/2011  *RADIOLOGY REPORT*  Clinical Data: Check endotracheal tube after placement.  PORTABLE CHEST - 1 VIEW  Comparison: 08/25/2011.  Findings: Endotracheal tube tip 11 cm above the carina at the lower neck/thoracic inlet junction.  Correlation with breath sounds recommended to confirm this is within the trachea and not in the esophagus.  Nasogastric tube curled within the stomach.  The tip is not included present exam.  Right central line tip projects over the mid  to distal superior vena cava level.  No gross pneumothorax.  Asymmetric pulmonary edema most notable perihilar region greater on the right has improved slightly since the prior exam.  Question pulmonary edema versus infectious infiltrate.  IMPRESSION: Endotracheal tube tip 11 cm above the carina at the lower neck/thoracic inlet junction.  Correlation with breath sounds recommended to confirm this is within the trachea and not in the esophagus.  Asymmetric pulmonary edema most notable perihilar region greater on the right has improved slightly since the prior exam.  Question pulmonary edema versus infectious infiltrate.  Original Report Authenticated By: Fuller Canada, M.D.   Dg Chest Portable 1 View  08/25/2011  *RADIOLOGY REPORT*  Clinical Data: Check endotracheal tube, shortness of  breath  PORTABLE CHEST - 1 VIEW  Comparison: 08/24/2011  Findings: Endotracheal tube at the thoracic inlet, terminating 7 cm above the carina.  Enteric tube looped in the stomach.  Right subclavian venous catheter terminating at the cavoatrial junction.  Patchy bilateral airspace opacities, possibly reflecting edema and/or infection.  No pleural effusion or pneumothorax.  The [name] is normal in size.  IMPRESSION: Endotracheal tube  terminates 10 cm above the carina.  Stable support apparatus, as above.  Patchy bilateral airspace opacities, possibly reflecting edema and/or infection.  Original Report Authenticated By: Charline Bills, M.D.   Dg Chest Portable 1 View  08/24/2011  *RADIOLOGY REPORT*  Clinical Data: Respiratory distress  PORTABLE CHEST - 1 VIEW  Comparison: 08/24/2011 8:50 hours  Findings: Under aerated lungs.  Diffuse bilateral airspace disease is worse.  Tubular devices are unchanged.  No pneumothorax.  Normal [name] size.  IMPRESSION: Worsening diffuse bilateral airspace disease.  Original Report Authenticated By: Donavan Burnet, M.D.   Dg Chest Portable 1 View  08/24/2011  *RADIOLOGY REPORT*  Clinical Data: Evaluate endotracheal tube placement  PORTABLE CHEST - 1 VIEW  Comparison: 08/23/2011; 09/15/2006  Findings:  Grossly unchanged cardiac silhouette and mediastinal contours. Endotracheal tube overlies tracheal air column with tip superior to the carina.  Enteric tube terminates into the left hemidiaphragm. Unchanged positioning of a right subclavian approach central venous catheter with tip overlying the mid SVC.  Improved aeration of the lungs with persistent perihilar heterogeneous opacities.  Mild cephalization of flow without frank evidence of pulmonary edema. No definite pneumothorax or pleural effusion.  Grossly unchanged bones.  IMPRESSION: 1.  Appropriately positioned support apparatus as above.  No definite pneumothorax. 2.  Improved aeration of the lungs with persistent perihilar  opacities, possibly atelectasis though infection is not excluded.  Original Report Authenticated By: Waynard Reeds, M.D.   Dg Chest Portable 1 View  08/23/2011  *RADIOLOGY REPORT*  Clinical Data: Respiratory difficulty  PORTABLE CHEST - 1 VIEW  Comparison: 09/15/2006  Findings: Low lung volumes.  Extensive bilateral diffuse airspace disease with a central distribution.  Endotracheal tube placed. Tip is 4.2 cm from carina.  Right subclavian central venous catheter tip at the cavoatrial junction.  No pneumothorax.  IMPRESSION: Endotracheal tube.  Right subclavian central venous catheter tip at the cavoatrial junction and no pneumothorax per  Bilateral diffuse airspace disease.  Original Report Authenticated By: Donavan Burnet, M.D.     PERTINENT LAB RESULTS: CBC: No results found for this basename: WBC:2,HGB:2,HCT:2,PLT:2 in the last 72 hours CMET CMP     Component Value Date/Time   NA 137 09/13/2011 0620   K 3.7 09/13/2011 0620   CL 105 09/13/2011 0620   CO2 20 09/13/2011 0620   GLUCOSE 92 09/13/2011 1610  BUN 4* 09/13/2011 0620   CREATININE 0.83 09/13/2011 0620   CALCIUM 8.9 09/13/2011 0620   PROT 8.0 09/11/2011 0640   ALBUMIN 3.0* 09/11/2011 0640   AST 21 09/11/2011 0640   ALT 26 09/11/2011 0640   ALKPHOS 128* 09/11/2011 0640   BILITOT 0.3 09/11/2011 0640   GFRNONAA >90 09/13/2011 0620   GFRAA >90 09/13/2011 0620    GFR Estimated Creatinine Clearance: 141.9 ml/min (by C-G formula based on Cr of 0.83). No results found for this basename: LIPASE:2,AMYLASE:2 in the last 72 hours No results found for this basename: CKTOTAL:3,CKMB:3,CKMBINDEX:3,TROPONINI:3 in the last 72 hours No results found for this basename: POCBNP:3 in the last 72 hours No results found for this basename: DDIMER:2 in the last 72 hours No results found for this basename: HGBA1C:2 in the last 72 hours No results found for this basename: CHOL:2,HDL:2,LDLCALC:2,TRIG:2,CHOLHDL:2,LDLDIRECT:2 in the last 72  hours No results found for this basename: TSH,T4TOTAL,FREET3,T3FREE,THYROIDAB in the last 72 hours No results found for this basename: VITAMINB12:2,FOLATE:2,FERRITIN:2,TIBC:2,IRON:2,RETICCTPCT:2 in the last 72 hours Coags: No results found for this basename: PT:2,INR:2 in the last 72 hours Microbiology: No results found for this or any previous visit (from the past 240 hour(s)).   BRIEF HOSPITAL COURSE:   Principal Problem:  *Aspiration pneumonia -Patient presented with Shortness of breath, nonproductive cough. He recently also had a episode of pancreatitis and before that he had a cardiac arrest secondary to torsades from anticholinergic effects of Benadryl.Given his recent history it was thought patient could possibly have aspiration pneumonia. He was admitted to the hospital, started on Unasyn.Over the past few days his cough and shortness of breath have almost resolved, he is significantly better and is requesting he be discharged home today.He has been afebrile overnight. He will be transitioned to augmentin for 3 more days.  Active Problems:  Seizure -stable, continue with dilantin   RUQ abdominal pain This resolved spontaneously. He is tolerating a diet with no difficulty. His abdomen is soft and not distended at all.   HTN (hypertension)   Continue with lopressor   TODAY-DAY OF DISCHARGE:  Subjective:   Ross [name] today has no headache,no chest abdominal pain,no new weakness tingling or numbness, feels much better wants to go home today. His back pain is significantly better today as well.  Objective:   Blood pressure 112/52, pulse 77, temperature 98.5 F (36.9 C), temperature source Oral, resp. rate 18, height 5\' 10"  (1.778 m), weight 108.863 kg (240 lb), SpO2 99.00%. No intake or output data in the 24 hours ending 09/23/11 1750  Exam Awake Alert, Oriented *3, No new F.N deficits, Normal affect [place].AT,PERRAL Supple Neck,No JVD, No cervical lymphadenopathy  appriciated.  Symmetrical Chest wall movement, Good air movement bilaterally, CTAB RRR,No Gallops,Rubs or new Murmurs, No Parasternal Heave +ve B.Sounds, Abd Soft, Non tender, No organomegaly appriciated, No rebound -guarding or rigidity. No Cyanosis, Clubbing or edema, No new Rash or bruise  DISPOSITION: Home in stable condition  DISCHARGE INSTRUCTIONS:    Follow-up Information    Follow up with VARANASI,JAYADEEP S. on 09/23/2011. (previous appt at 2:30 pm. Please keep this appointment)    Contact information:   [address] [place] [address] [phone]       Follow up with [facility]. (MAKE APPT WITH PCP AND NEUROLOGIST IN 1-2 WEEKS)           Total Time spent on discharge equals 45 minutes.  SignedJeoffrey Massed 09/23/2011 5:50 PM

## 2011-10-05 ENCOUNTER — Other Ambulatory Visit: Payer: Self-pay | Admitting: Internal Medicine

## 2012-01-09 ENCOUNTER — Telehealth (INDEPENDENT_AMBULATORY_CARE_PROVIDER_SITE_OTHER): Payer: Self-pay | Admitting: General Surgery

## 2012-01-09 NOTE — Telephone Encounter (Signed)
openned on this pt in error...Marland Kitchenbpenny, RN

## 2013-11-19 DIAGNOSIS — F10939 Alcohol use, unspecified with withdrawal, unspecified: Secondary | ICD-10-CM | POA: Insufficient documentation

## 2013-11-19 DIAGNOSIS — F10239 Alcohol dependence with withdrawal, unspecified: Secondary | ICD-10-CM | POA: Insufficient documentation

## 2013-11-20 DIAGNOSIS — I429 Cardiomyopathy, unspecified: Secondary | ICD-10-CM | POA: Insufficient documentation

## 2014-03-11 ENCOUNTER — Telehealth: Payer: Self-pay | Admitting: *Deleted

## 2014-03-11 NOTE — Telephone Encounter (Signed)
Calling for the second time.  Trying to get a refill on an order of inserts.  I attempted to call the patient.  The number he left was incorrect number and his home number listed has been changed.

## 2014-06-20 ENCOUNTER — Encounter: Payer: Self-pay | Admitting: General Surgery

## 2014-09-24 ENCOUNTER — Encounter (HOSPITAL_COMMUNITY): Payer: Self-pay | Admitting: Interventional Cardiology

## 2015-02-21 DIAGNOSIS — F1020 Alcohol dependence, uncomplicated: Secondary | ICD-10-CM | POA: Insufficient documentation

## 2015-03-27 ENCOUNTER — Encounter (HOSPITAL_COMMUNITY): Payer: Self-pay | Admitting: Emergency Medicine

## 2015-03-27 ENCOUNTER — Emergency Department (HOSPITAL_COMMUNITY)
Admission: EM | Admit: 2015-03-27 | Discharge: 2015-03-31 | Disposition: A | Discharge status: Home / Self Care | Payer: Medicare Other | Attending: Emergency Medicine | Admitting: Emergency Medicine

## 2015-03-27 DIAGNOSIS — B9689 Other specified bacterial agents as the cause of diseases classified elsewhere: Secondary | ICD-10-CM | POA: Insufficient documentation

## 2015-03-27 DIAGNOSIS — E119 Type 2 diabetes mellitus without complications: Secondary | ICD-10-CM | POA: Insufficient documentation

## 2015-03-27 DIAGNOSIS — Z008 Encounter for other general examination: Secondary | ICD-10-CM | POA: Diagnosis present

## 2015-03-27 DIAGNOSIS — Z8701 Personal history of pneumonia (recurrent): Secondary | ICD-10-CM | POA: Diagnosis not present

## 2015-03-27 DIAGNOSIS — F23 Brief psychotic disorder: Secondary | ICD-10-CM

## 2015-03-27 DIAGNOSIS — I10 Essential (primary) hypertension: Secondary | ICD-10-CM | POA: Insufficient documentation

## 2015-03-27 DIAGNOSIS — F209 Schizophrenia, unspecified: Secondary | ICD-10-CM | POA: Diagnosis not present

## 2015-03-27 DIAGNOSIS — Z8719 Personal history of other diseases of the digestive system: Secondary | ICD-10-CM | POA: Diagnosis not present

## 2015-03-27 DIAGNOSIS — Z79899 Other long term (current) drug therapy: Secondary | ICD-10-CM | POA: Diagnosis not present

## 2015-03-27 DIAGNOSIS — F29 Unspecified psychosis not due to a substance or known physiological condition: Secondary | ICD-10-CM | POA: Insufficient documentation

## 2015-03-27 DIAGNOSIS — F4312 Post-traumatic stress disorder, chronic: Secondary | ICD-10-CM | POA: Diagnosis not present

## 2015-03-27 DIAGNOSIS — A498 Other bacterial infections of unspecified site: Secondary | ICD-10-CM

## 2015-03-27 LAB — COMPREHENSIVE METABOLIC PANEL
ALT: 17 U/L (ref 17–63)
AST: 35 U/L (ref 15–41)
Albumin: 4.5 g/dL (ref 3.5–5.0)
Alkaline Phosphatase: 79 U/L (ref 38–126)
Anion gap: 10 (ref 5–15)
BUN: 17 mg/dL (ref 6–20)
CO2: 27 mmol/L (ref 22–32)
Calcium: 9.6 mg/dL (ref 8.9–10.3)
Chloride: 99 mmol/L — ABNORMAL LOW (ref 101–111)
Creatinine, Ser: 1.12 mg/dL (ref 0.61–1.24)
GFR calc Af Amer: >60 mL/min (ref >60)
GFR calc non Af Amer: >60 mL/min (ref >60)
Glucose, Bld: 103 mg/dL — ABNORMAL HIGH (ref 65–99)
Potassium: 3.6 mmol/L (ref 3.5–5.1)
Sodium: 136 mmol/L (ref 135–145)
Total Bilirubin: 1.3 mg/dL — ABNORMAL HIGH (ref 0.3–1.2)
Total Protein: 8.4 g/dL — ABNORMAL HIGH (ref 6.5–8.1)

## 2015-03-27 LAB — CBG MONITORING, ED: Glucose-Capillary: 98 mg/dL (ref 65–99)

## 2015-03-27 LAB — CBC
HCT: 44.7 % (ref 39.0–52.0)
Hemoglobin: 14.7 g/dL (ref 13.0–17.0)
MCH: 28.4 pg (ref 26.0–34.0)
MCHC: 32.9 g/dL (ref 30.0–36.0)
MCV: 86.3 fL (ref 78.0–100.0)
Platelets: 169 10*3/uL (ref 150–400)
RBC: 5.18 MIL/uL (ref 4.22–5.81)
RDW: 15 % (ref 11.5–15.5)
WBC: 12.2 10*3/uL — ABNORMAL HIGH (ref 4.0–10.5)

## 2015-03-27 LAB — RAPID URINE DRUG SCREEN, HOSP PERFORMED
Amphetamines: NOT DETECTED
Barbiturates: NOT DETECTED
Benzodiazepines: NOT DETECTED
Cocaine: NOT DETECTED
Opiates: NOT DETECTED
Tetrahydrocannabinol: NOT DETECTED

## 2015-03-27 LAB — SALICYLATE LEVEL: Salicylate Lvl: <4 mg/dL (ref 2.8–30.0)

## 2015-03-27 LAB — ACETAMINOPHEN LEVEL: Acetaminophen (Tylenol), Serum: <10 ug/mL — ABNORMAL LOW (ref 10–30)

## 2015-03-27 LAB — ETHANOL: Alcohol, Ethyl (B): <5 mg/dL (ref <5)

## 2015-03-27 NOTE — ED Provider Notes (Signed)
CSN: [id number]     Arrival date & time 03/27/15  1914 History  This chart was scribed for Earley Favor, NP working with Raeford Razor, MD by Evon Slack, ED Scribe. This patient was seen in room WTR3/WLPT3 and the patient's care was started at 10:08 PM.     Chief Complaint  Patient presents with  . Medical Clearance    IVC from [facility]   The history is provided by the patient. No language interpreter was used.   HPI Comments: Ross [name] is a 49 y.o. male with PMHX of PTSD, anxiety, seizures and DM who presents to the Emergency Department for medical clearance under IVC paperwork. Pt was in jail earlier today and decided to flush his clothes down the toilet and use the defecate on them. Pt report Hx of being admitted at [facility], due to PTSD and his diabetes.  Pt states that he has not been compliant with his psychiatric medications for the past 10 days. Pt denies SI or HI. Pt states that he is followed by the [facility] hospital in [place], [place].   Past Medical History  Diagnosis Date  . Sleep apnea     had surgery to correct it  . Mental disorder     PTSD  . Seizures     currently dx with seizures  . Cardiac arrest 08/23/2011  . Pneumonia 09/04/2011  . Pancreatitis   . SOB (shortness of breath) 09/11/2011  . Hypertension   . Anxiety    Past Surgical History  Procedure Laterality Date  . Tonsillectomy    . Uvulopalatopharyngoplasty, tonsillectomy and septoplasty  06/16/2000  . Left heart catheterization with coronary angiogram N/A 08/24/2011    Procedure: LEFT HEART CATHETERIZATION WITH CORONARY ANGIOGRAM;  Surgeon: Corky Crafts, MD;  Location: [facility] CATH LAB;  Service: Cardiovascular;  Laterality: N/A;   Family History  Problem Relation Age of Onset  . Heart attack Mother    History  Substance Use Topics  . Smoking status: Former Smoker    Types: Cigarettes  . Smokeless tobacco: Never Used  . Alcohol Use: Yes     Comment: occ    Review of Systems   Constitutional: Positive for activity change. Negative for appetite change.  Respiratory: Negative for shortness of breath.   Cardiovascular: Negative for chest pain.  Gastrointestinal: Negative for abdominal pain.  Endocrine: Negative for polydipsia, polyphagia and polyuria.  Neurological: Negative for dizziness and headaches.  Psychiatric/Behavioral: Positive for behavioral problems. Negative for self-injury.  All other systems reviewed and are negative.     Allergies  Benadryl and Antihistamines, diphenhydramine-type  Home Medications   Prior to Admission medications   Medication Sig Start Date End Date Taking? Authorizing Provider  cetaphil (CETAPHIL) cream Apply 1 application topically daily as needed (dry skin). Apply after bathing   Yes Historical Provider, MD  Multiple Vitamins-Minerals (MULTIVITAMIN ADULT PO) Take 1 tablet by mouth daily.   Yes Historical Provider, MD  Omega-3 Fatty Acids (FISH OIL PO) Take 1 tablet by mouth daily.   Yes Historical Provider, MD   BP 134/87 mmHg  Pulse 90  Temp(Src) 98.5 F (36.9 C) (Oral)  Resp 16  SpO2 100%   Physical Exam  Constitutional: He is oriented to person, place, and time. He appears well-developed and well-nourished. No distress.  HENT:  Head: Normocephalic and atraumatic.  Eyes: Conjunctivae and EOM are normal.  Neck: Neck supple.  Cardiovascular: Normal rate.   Pulmonary/Chest: Effort normal. No respiratory distress.  Musculoskeletal: Normal range  of motion.  Neurological: He is alert and oriented to person, place, and time.  Skin: Skin is warm and dry.  Psychiatric: He has a normal mood and affect. His behavior is normal. His speech is tangential. He expresses impulsivity. He expresses no homicidal and no suicidal ideation. He expresses no suicidal plans and no homicidal plans.  pychotic circular thought   Nursing note and vitals reviewed.   ED Course  Procedures (including critical care time) DIAGNOSTIC  STUDIES: Oxygen Saturation is 100% on RA, normal by my interpretation.    COORDINATION OF CARE:    Labs Review Labs Reviewed  ACETAMINOPHEN LEVEL - Abnormal; Notable for the following:    Acetaminophen (Tylenol), Serum <10 (*)    All other components within normal limits  CBC - Abnormal; Notable for the following:    WBC 12.2 (*)    All other components within normal limits  COMPREHENSIVE METABOLIC PANEL - Abnormal; Notable for the following:    Chloride 99 (*)    Glucose, Bld 103 (*)    Total Protein 8.4 (*)    Total Bilirubin 1.3 (*)    All other components within normal limits  ETHANOL  SALICYLATE LEVEL  URINE RAPID DRUG SCREEN, HOSP PERFORMED  CBG MONITORING, ED    Imaging Review No results found.   EKG Interpretation None      MDM   Final diagnoses:  None       I personally performed the services described in this documentation, which was scribed in my presence. The recorded information has been reviewed and is accurate.    Earley Favor, NP 03/28/15 2013  Shon Baton, MD 03/29/15 2250

## 2015-03-27 NOTE — ED Notes (Signed)
Per sheriff, was placed in jail buy [employer] yesterday because he was acting drunk-released today but jail felt he was unsafe to be on his own-insulin dependent so [facility] refused patient

## 2015-03-27 NOTE — ED Notes (Signed)
Pt. To [facility] from ED ambulatory without difficulty, to room 38. Report from Autumn RN. Pt. Is alert and oriented, warm and dry in no distress. Pt. Denies SI, HI, and AVH. Pt. Calm and cooperative. Pt. Made aware of security cameras and Q15 minute rounds. Pt. Encouraged to let Nursing staff know of any concerns or needs.

## 2015-03-27 NOTE — ED Notes (Signed)
Very little medical or medication history available at this time per Autumn RN.

## 2015-03-27 NOTE — ED Notes (Signed)
Pt stated that he came to the hospital to have his medical condition evaluated. C/o chronic foot pain, denies SI or HI

## 2015-03-27 NOTE — BH Assessment (Addendum)
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 He is accompanied by [employer], who believed that pt was not in proper condition to be released out into the community without first undergoing psychiatric evaluation. Pt is currently under IVC by police. Per IVC, pt was exhibiting bizarre behavior while in jail, such as flushing his clothing down the toilet, defecating in inappropriate places, and urinating on the floor of his cell. He was originally picked up by police 10 days ago and jailed for public intoxication. Pt presents with euthymic mood, blunted affect, good eye-contact, and disorganized thinking pattern. Speech is not linear and pt is very guarded and suspicious, providing little information to questions asked. He also displays inappropriate laughter at times but does not appear to be responding to internal stimuli. Pt is wearing wine-colored scrubs and seems guarded and suspicious of the counselor, despite saying that he is enjoying talking to her. Pt does not express any delusional beliefs but, again, he is rather suspicious. Pt's concentration is good and he is well-oriented, calm, and cooperative throughout assessment. He has a hx of PTSD dx and also endorses some depressive sx (e.g. Insomnia, crying spells, irritability). TTS Counselor spoke with pt's mother in order to gain collateral information; she reports bizarre and psychotic behavior over the past few weeks, such as walking around the house naked, talking to himself, and "rambling on" to the extent that no one can understand or follow his train of thought. Per mom, pt is not compliant with his medications and she does not administer them to him daily, as the pt claimed. Pt's mother added that the pt drinks etoh daily, which is why he cannot return to her home to live after d/c. Pt did admit to drinking about a fifth of vodka per day but he  denied addiction or withdrawal sx.  Pt reports that he receives mental health tx at the [place] in [place]. He reports being hospitalized at the [facility] 3x so far this year. He has no hx of any other psychiatric hospitalizations, including [facility]. Pt reports no access to weapons and denies any hx of abuse. However, pt did experience traumatic events while serving in the [employer]. Family hx is positive for SA and MI. Pt's UDS and BAL are all clear and he denies any SA. 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 Pt denies SI/HI. He denies any past suicide attempts or self-harm and pt's mother verified this. Pt reports constant foot pain and intermittent back back. He states that he is prescribed a cane but does not always use it.  Per IVC paperwork: "Respondent inmate Lares stuffed his clothing in the toilet, defecated on them and shoved his clothing further down the toilet. Inmate Marsico urinated on the floor and had feces on the floor, bench, and walls of his cell. Inmate Gaglio was asked where his clothing was and he stated that he wanted sushi. Inmate Stgermaine was asked a second time where his clothing was and he stated that he was wearing them. Inmate Rochon has displayed odd behavior and appears to have mental health issues. Respondent needs to be evaluated for possible mental illness."  Disposition: Per Alberteen Sam, NP, pt to be re-evaluated in the morning by psychiatry.  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 Sleep apnea     had surgery to correct it  . Mental disorder     PTSD  . Seizures     currently dx with seizures  . Cardiac arrest 08/23/2011  . Pneumonia 09/04/2011  . Pancreatitis   . SOB (shortness of breath) 09/11/2011  . Hypertension   . Anxiety    Axis IV: economic  problems, educational problems, housing problems, occupational problems, other psychosocial or environmental problems, problems related to legal system/crime, problems related to social environment, problems with access to health care services and problems with primary support group Axis V: 41-50 serious symptoms  Past Medical History:  Past Medical History  Diagnosis Date  . Sleep apnea     had surgery to correct it  . Mental disorder     PTSD  . Seizures     currently dx with seizures  . Cardiac arrest 08/23/2011  . Pneumonia 09/04/2011  . Pancreatitis   . SOB (shortness of breath) 09/11/2011  . Hypertension   . Anxiety     Past Surgical History  Procedure Laterality Date  . Tonsillectomy    . Uvulopalatopharyngoplasty, tonsillectomy and septoplasty  06/16/2000  . Left heart catheterization with coronary angiogram N/A 08/24/2011    Procedure: LEFT HEART CATHETERIZATION WITH CORONARY ANGIOGRAM;  Surgeon: Corky Crafts, MD;  [facility] C[facility] LAB;  Service: Cardiovascular;  Laterality: N/A;    Family History:  Family History  Problem Relation Age of Onset  . Heart attack Mother     Social History:  reports that he has quit smoking. His smoking use included Cigarettes. He has never used smokeless tobacco. He reports that he drinks alcohol. He reports that he does not use illicit drugs.  Additional Social History:  Alcohol / Drug Use Pain Medications: See PTA List Prescriptions: See PTA List Over the Counter: See PTA List History of alcohol / drug use?: No history of alcohol / drug abuse  CIWA: CIWA-Ar BP: 128/83 mmHg Pulse Rate: 79 COWS:    PATIENT STRENGTHS: (choose at least two) Ability for insight Average or above average intelligence Capable of independent living Communication skills General fund of knowledge Supportive family/friends  Allergies:  Allergies  Allergen Reactions  . Benadryl [Diphenhydramine Hcl]     anaphylaxis  . Antihistamines,  Diphenhydramine-Type     Rash/eczema     Home Medications:  (Not in a hospital admission)  OB/GYN Status:  No LMP for male patient.  General Assessment Data [facility] of Assessment: [facility] TTS Assessment: In system Is this a Tele or Face-to-Face Assessment?: Face-to-Face Is this an Initial Assessment or a Re-assessment for this encounter?: Initial Assessment Marital status: Separated Is patient pregnant?: No Pregnancy Status: No Living Arrangements: Alone Can pt return to current living arrangement?: Yes Admission Status: Involuntary Is patient capable of signing voluntary admission?: No Referral Source: Other Insurance type: VA Benefits     Crisis Care Plan Living Arrangements: Alone Name of Psychiatrist: [facility] [place] Name of Therapist: [facility] [place]  Education Status Is patient currently in school?: No Current Grade: na Highest grade of school patient has completed: na Name of school: na Contact person: na  Risk to self with the past 6 months Suicidal Ideation: No Has patient been a risk to self within the past 6 months prior to admission? : No Suicidal Intent: No Has patient had any suicidal intent within the past  6 months prior to admission? : No Is patient at risk for suicide?: No Suicidal Plan?: No Has patient had any suicidal plan within the past 6 months prior to admission? : No Access to Means: No What has been your use of drugs/alcohol within the last 12 months?: Social drinking only, per pt Previous Attempts/Gestures: No How many times?: 0 Other Self Harm Risks: None known Triggers for Past Attempts: Unknown Intentional Self Injurious Behavior: None Family Suicide History: No Recent stressful life event(s): Conflict (Comment) (Separation from wife) Persecutory voices/beliefs?: Yes Depression: Yes Depression Symptoms: Insomnia, Tearfulness, Isolating, Feeling angry/irritable Substance abuse history and/or treatment for substance abuse?: No Suicide  prevention information given to non-admitted patients: Not applicable  Risk to Others within the past 6 months Homicidal Ideation: No Does patient have any lifetime risk of violence toward others beyond the six months prior to admission? : No Thoughts of Harm to Others: No Current Homicidal Intent: No Current Homicidal Plan: No Access to Homicidal Means: No Identified Victim: n/a History of harm to others?: Yes Assessment of Violence: None Noted Violent Behavior Description: Pt calm and cooperative; No known hx of violence Does patient have access to weapons?: No Criminal Charges Pending?: No Does patient have a court date: No Is patient on probation?: No (However, pt just released from jail)  Psychosis Hallucinations: Auditory Delusions: Persecutory  Mental Status Report Appearance/Hygiene: Disheveled Eye Contact: Fair Motor Activity: Freedom of movement Speech: Logical/coherent Level of Consciousness: Quiet/awake Mood: Helpless Affect: Blunted Anxiety Level: Minimal Thought Processes: Coherent, Relevant Judgement: Partial Orientation: Person, Place Obsessive Compulsive Thoughts/Behaviors: None  Cognitive Functioning Concentration: Fair Memory: Unable to Assess IQ: Average Insight: Fair Impulse Control: Fair Appetite: Good Weight Loss: 0 Weight Gain: 0 Sleep: Decreased Total Hours of Sleep: 2 Vegetative Symptoms: Staying in bed  ADLScreening [facility] Assessment Services) Patient's cognitive ability adequate to safely complete daily activities?: Yes Patient able to express need for assistance with ADLs?: Yes Independently performs ADLs?: Yes (appropriate for developmental age)  Prior Inpatient Therapy Prior Inpatient Therapy: Yes Prior Therapy Dates: Unknown, but within the last couple of years Prior Therapy Facilty/Provider(s): [facility] [place] Reason for Treatment: PTSD  Prior Outpatient Therapy Prior Outpatient Therapy: Yes Prior Therapy Dates: Ongoing Prior  Therapy Facilty/Provider(s): [facility] [place] Reason for Treatment: PTSD Does patient have an ACCT team?: No Does patient have Intensive In-House Services?  : No Does patient have [facility] services? : No Does patient have P4CC services?: No  ADL Screening (condition at time of admission) Patient's cognitive ability adequate to safely complete daily activities?: Yes Is the patient deaf or have difficulty hearing?: No Does the patient have difficulty seeing, even when wearing glasses/contacts?: No Does the patient have difficulty concentrating, remembering, or making decisions?: No Patient able to express need for assistance with ADLs?: Yes Does the patient have difficulty dressing or bathing?: No Independently performs ADLs?: Yes (appropriate for developmental age) Does the patient have difficulty walking or climbing stairs?: No Weakness of Legs: None Weakness of Arms/Hands: None  Home Assistive Devices/Equipment Home Assistive Devices/Equipment: None    Abuse/Neglect Assessment (Assessment to be complete while patient is alone) Physical Abuse: Denies Verbal Abuse: Denies Sexual Abuse: Denies Exploitation of patient/patient's resources: Denies Self-Neglect: Denies Values / Beliefs Cultural Requests During Hospitalization: None Spiritual Requests During Hospitalization: None   Advance Directives (For Healthcare) Does patient have an advance directive?: No Would patient like information on creating an advanced directive?: No - patient declined information    Additional Information 1:1 In Past 12  Months?: No CIRT Risk: No Elopement Risk: No Does patient have medical clearance?: Yes     Disposition: Per Alberteen Sam, NP, pt to be re-evaluated in the morning by psychiatry.  Disposition Initial Assessment Completed for this Encounter: Yes Disposition of Patient: Other dispositions (AM Psych Eval to uphold or rescind IVC) Other disposition(s): Other (Comment)  Cyndie Mull,  [facility] Triage Specialist  03/28/2015 4:22 AM

## 2015-03-28 DIAGNOSIS — F29 Unspecified psychosis not due to a substance or known physiological condition: Secondary | ICD-10-CM | POA: Diagnosis not present

## 2015-03-28 LAB — OCCULT BLOOD X 1 CARD TO LAB, STOOL: Fecal Occult Bld: NEGATIVE

## 2015-03-28 LAB — CBG MONITORING, ED: Glucose-Capillary: 87 mg/dL (ref 65–99)

## 2015-03-28 MED ORDER — LOPERAMIDE HCL 2 MG PO CAPS
4.0000 mg | ORAL_CAPSULE | Freq: Three times a day (TID) | ORAL | Status: DC | PRN
  Administered 2015-03-28: 4 mg via ORAL
  Filled 2015-03-28 (×2): qty 2

## 2015-03-28 MED ORDER — BENZTROPINE MESYLATE 1 MG PO TABS
1.0000 mg | ORAL_TABLET | Freq: Every day | ORAL | Status: DC
  Administered 2015-03-28 – 2015-03-30 (×3): 1 mg via ORAL
  Filled 2015-03-28 (×3): qty 1

## 2015-03-28 MED ORDER — METFORMIN HCL 500 MG PO TABS
500.0000 mg | ORAL_TABLET | Freq: Every day | ORAL | Status: DC
  Administered 2015-03-28 – 2015-03-31 (×4): 500 mg via ORAL
  Filled 2015-03-28 (×5): qty 1

## 2015-03-28 MED ORDER — OLANZAPINE 10 MG PO TABS
10.0000 mg | ORAL_TABLET | Freq: Every day | ORAL | Status: DC
  Administered 2015-03-28: 10 mg via ORAL
  Filled 2015-03-28: qty 1

## 2015-03-28 MED ORDER — VITAMIN B-1 100 MG PO TABS
100.0000 mg | ORAL_TABLET | Freq: Every day | ORAL | Status: DC
  Administered 2015-03-28 – 2015-03-31 (×4): 100 mg via ORAL
  Filled 2015-03-28 (×4): qty 1

## 2015-03-28 MED ORDER — FLUPHENAZINE HCL 5 MG PO TABS
5.0000 mg | ORAL_TABLET | Freq: Two times a day (BID) | ORAL | Status: DC
  Administered 2015-03-28 – 2015-03-31 (×6): 5 mg via ORAL
  Filled 2015-03-28 (×8): qty 1

## 2015-03-28 MED ORDER — TRAZODONE HCL 50 MG PO TABS
50.0000 mg | ORAL_TABLET | Freq: Every day | ORAL | Status: DC
  Administered 2015-03-28 – 2015-03-30 (×3): 50 mg via ORAL
  Filled 2015-03-28 (×3): qty 1

## 2015-03-28 MED ORDER — NALTREXONE HCL 50 MG PO TABS
50.0000 mg | ORAL_TABLET | Freq: Every day | ORAL | Status: DC
  Administered 2015-03-28 – 2015-03-31 (×4): 50 mg via ORAL
  Filled 2015-03-28 (×4): qty 1

## 2015-03-28 NOTE — ED Notes (Signed)
Pt in room, incont of stool.  Pt reports that he thinks it was caused by the lunch (tomato sauce) that he ate.  Pt denies pain/discomfort.  Pt unable to say why he was not able to go to the bathroom other than it occurred quickly.  Brown, mucoid stool w/ blood noted.      NP aware.

## 2015-03-28 NOTE — ED Notes (Signed)
Up on the phone 

## 2015-03-28 NOTE — ED Notes (Signed)
Pt. Noted sitting in room. No complaints or concerns voiced. No distress or abnormal behavior noted. Will continue to monitor with security cameras. Q 15 minute rounds continue. 

## 2015-03-28 NOTE — ED Notes (Signed)
Pt. Noted in room. No complaints or concerns voiced. No distress or abnormal behavior noted. Will continue to monitor with security cameras. Q 15 minute rounds continue. 

## 2015-03-28 NOTE — ED Notes (Signed)
Pt reports that he did not realize that he was having loose stools and thoughthe was going to pass gas when it happened.  Pt instructed to go to the bathroom when he fells that he is going to pass gas.  Pt also reports that this has been going on for at least a week-several times a day-and that he has not noticed any blood in his stools.

## 2015-03-28 NOTE — ED Notes (Signed)
Patient assessed, patient resting comfortably at this time.  Stretcher in lowest position, Call bell within reach of patient. Will continue to monitor accordingly with security cameras and q 15 min monitoring.    

## 2015-03-28 NOTE — Progress Notes (Signed)
12:56pm. CSW spoke with pt's sister, Judeth Cornfield [phone]), to get collateral. Sister lives in [place]. Sister reports that there is no mental health hx in family, and that he did not have mental health problems as a child or young adult. Pt served in [employer] for 5 years, service ending in 1996. After that he lived with Treynor, and she did not notice any mental health issues. Pt eventually moved to [facility] around 2000, and that is when his alcohol problems began. Sister reports that pt has had major problems with alcohol ever since--for example, he has come to family funerals intoxicated and has been let go from jobs due to drinking. Pt became estranged from the family starting in 2002-2015. In 2015, sister reconnected with patient; however, he was mentally deteriorating, expressing paranoid thoughts. Sister says that recently patient has been incoherent, unable to string sentences together. He also acts bizarrely, taking off his clothes and wrapping himself up in his mother's clothes. He is also threatening towards his mother, who lives in [place]. He came to ED because his mother had to call the police for safety after he showed up at her house last night, and then police brought him here.  CSW passed this information along to psych team.  [facility] Clinical Social Worker [facility] phone: [phone]

## 2015-03-28 NOTE — ED Notes (Signed)
Pt. Noted sleeping in room. No complaints or concerns voiced. No distress or abnormal behavior noted. Will continue to monitor with security cameras. Q 15 minute rounds continue. 

## 2015-03-28 NOTE — [facility] Counselor (Signed)
Disposition: Per Alberteen Sam, NP, pt to be re-evaluated in the morning by psychiatry.

## 2015-03-28 NOTE — ED Notes (Signed)
Stretcher in lowest position, Call bell within reach of patient. Will continue to monitor accordingly with security cameras and q 15 min monitoring.

## 2015-03-28 NOTE — Consult Note (Signed)
[facility] Consult   Reason for Consult:  Psychosis, Schizophrenia, R/O, Chronic PTSD Referring Physician:  EDP Patient Identification: Ross [name] MRN:  [id number] Principal Diagnosis: Psychosis Diagnosis:   Patient Active Problem List   Diagnosis Date Noted  . Psychosis [F29] 03/28/2015    Priority: High  . HTN (hypertension) [I10] 09/12/2011  . Aspiration pneumonia [J69.0] 09/11/2011  . RUQ abdominal pain [R10.11] 09/11/2011  . SOB (shortness of breath) [R06.02] 09/11/2011  . Pneumonia [J18.9] 09/04/2011  . Fever [R50.9] 09/04/2011  . Acute pancreatitis [K85.9] 09/03/2011  . Abdominal pain [789.0] 09/03/2011  . Acute encephalopathy [G93.40] 08/28/2011  . Hypernatremia [E87.0] 08/27/2011  . Seizure [R56.9] 08/23/2011  . Poisoning by anticholinergic drug [T44.3X1A] 08/23/2011  . Eczema [L30.9] 08/23/2011  . Papular rash, generalized [R21] 08/23/2011  . HTN (hypertension), malignant [I10] 08/23/2011  . Hyperglycemia [R73.9] 08/23/2011  . PTSD (post-traumatic stress disorder) [F43.10] 08/23/2011  . Cardiac arrest [I46.9] 08/23/2011  . QT prolongation [I45.81] 08/23/2011    Total Time spent with patient: 1 hour  Subjective:   Ross [name] is a 49 y.o. male patient admitted with Psychosis, Schizophrenia, R/O, Chronic PTSD.  HPI:  AA male was evaluated this morning for bizarre behavior at the jail where he was yesterday.  Patient was brought in from [place] where he was for for DUI under IVC.  Patient was trying to flush his clothing in the toilet at the jail.  This morning during our assessment patient was finding it difficult to put his words together.  He admitted to a diagnosis of PTSD after his [place] war service.  He also admitted to a diagnosis of anxiety and drinking problem.  He reports drinking a fifth a day.  He also uses Marijuana occasionally according to him.  He reported that he has not taken his MH medications for 10 days because he was in  jail.  He did not know the names of his medications.  He reports receving care at the [place] [facility] in [place] and that he was hospitalized at the [facility] 30 days ago.  He could not remember why he was hospitalized.  Collateral information from his mother states that Patient has been found in the house naked.  He has been non compliant with his medications.  He denied SI/HI/AVH and has been accepted for admission.   Please see Collateral information from his sister this afternoon.  HPI Elements:   Location:  Psychosis, Schizophrenia Spectrum disorder.. Quality:  severe. Severity:  severe. Timing:  Acute. Duration:  Chronic Mental illness.. Context:  IVC for bizarre behavior and intoxication.  Past Medical History:  Past Medical History  Diagnosis Date  . Sleep apnea     had surgery to correct it  . Mental disorder     PTSD  . Seizures     currently dx with seizures  . Cardiac arrest 08/23/2011  . Pneumonia 09/04/2011  . Pancreatitis   . SOB (shortness of breath) 09/11/2011  . Hypertension   . Anxiety     Past Surgical History  Procedure Laterality Date  . Tonsillectomy    . Uvulopalatopharyngoplasty, tonsillectomy and septoplasty  06/16/2000  . Left heart catheterization with coronary angiogram N/A 08/24/2011    Procedure: LEFT HEART CATHETERIZATION WITH CORONARY ANGIOGRAM;  Surgeon: Jettie Booze, MD;  Location: [facility] CATH LAB;  Service: Cardiovascular;  Laterality: N/A;   Family History:  Family History  Problem Relation Age of Onset  . Heart attack Mother    Social History:  History  Alcohol Use  . Yes    Comment: occ     History  Drug Use No    History   Social History  . Marital Status: Married    Spouse Name: N/A  . Number of Children: N/A  . Years of Education: N/A   Social History Main Topics  . Smoking status: Former Smoker    Types: Cigarettes  . Smokeless tobacco: Never Used  . Alcohol Use: Yes     Comment: occ  . Drug Use: No  . Sexual Activity: Not  Currently   Other Topics Concern  . None   Social History Narrative   Additional Social History:    Pain Medications: See PTA List Prescriptions: See PTA List Over the Counter: See PTA List History of alcohol / drug use?: No history of alcohol / drug abuse   Allergies:   Allergies  Allergen Reactions  . Benadryl [Diphenhydramine Hcl]     anaphylaxis  . Antihistamines, Diphenhydramine-Type     Rash/eczema     Labs:  Results for orders placed or performed during the hospital encounter of 03/27/15 (from the past 48 hour(s))  CBG monitoring, ED     Status: None   Collection Time: 03/27/15  7:24 PM  Result Value Ref Range   Glucose-Capillary 98 65 - 99 mg/dL  Acetaminophen level     Status: Abnormal   Collection Time: 03/27/15  7:37 PM  Result Value Ref Range   Acetaminophen (Tylenol), Serum <10 (L) 10 - 30 ug/mL    Comment:        THERAPEUTIC CONCENTRATIONS VARY SIGNIFICANTLY. A RANGE OF 10-30 ug/mL MAY BE AN EFFECTIVE CONCENTRATION FOR MANY PATIENTS. HOWEVER, SOME ARE BEST TREATED AT CONCENTRATIONS OUTSIDE THIS RANGE. ACETAMINOPHEN CONCENTRATIONS >150 ug/mL AT 4 HOURS AFTER INGESTION AND >50 ug/mL AT 12 HOURS AFTER INGESTION ARE OFTEN ASSOCIATED WITH TOXIC REACTIONS.   Ethanol (ETOH)     Status: None   Collection Time: 03/27/15  7:37 PM  Result Value Ref Range   Alcohol, Ethyl (B) <5 <5 mg/dL    Comment:        LOWEST DETECTABLE LIMIT FOR SERUM ALCOHOL IS 5 mg/dL FOR MEDICAL PURPOSES ONLY   Salicylate level     Status: None   Collection Time: 03/27/15  7:37 PM  Result Value Ref Range   Salicylate Lvl <2.0 2.8 - 30.0 mg/dL  CBC     Status: Abnormal   Collection Time: 03/27/15  7:38 PM  Result Value Ref Range   WBC 12.2 (H) 4.0 - 10.5 K/uL   RBC 5.18 4.22 - 5.81 MIL/uL   Hemoglobin 14.7 13.0 - 17.0 g/dL   HCT 44.7 39.0 - 52.0 %   MCV 86.3 78.0 - 100.0 fL   MCH 28.4 26.0 - 34.0 pg   MCHC 32.9 30.0 - 36.0 g/dL   RDW 15.0 11.5 - 15.5 %   Platelets 169  150 - 400 K/uL  Comprehensive metabolic panel     Status: Abnormal   Collection Time: 03/27/15  7:38 PM  Result Value Ref Range   Sodium 136 135 - 145 mmol/L   Potassium 3.6 3.5 - 5.1 mmol/L   Chloride 99 (L) 101 - 111 mmol/L   CO2 27 22 - 32 mmol/L   Glucose, Bld 103 (H) 65 - 99 mg/dL   BUN 17 6 - 20 mg/dL   Creatinine, Ser 1.12 0.61 - 1.24 mg/dL   Calcium 9.6 8.9 - 10.3 mg/dL   Total Protein  8.4 (H) 6.5 - 8.1 g/dL   Albumin 4.5 3.5 - 5.0 g/dL   AST 35 15 - 41 U/L   ALT 17 17 - 63 U/L   Alkaline Phosphatase 79 38 - 126 U/L   Total Bilirubin 1.3 (H) 0.3 - 1.2 mg/dL   GFR calc non Af Amer >60 >60 mL/min   GFR calc Af Amer >60 >60 mL/min    Comment: (NOTE) The eGFR has been calculated using the CKD EPI equation. This calculation has not been validated in all clinical situations. eGFR's persistently <60 mL/min signify possible Chronic Kidney Disease.    Anion gap 10 5 - 15  Urine rapid drug screen (hosp performed)not at [facility]     Status: None   Collection Time: 03/27/15 10:06 PM  Result Value Ref Range   Opiates NONE DETECTED NONE DETECTED   Cocaine NONE DETECTED NONE DETECTED   Benzodiazepines NONE DETECTED NONE DETECTED   Amphetamines NONE DETECTED NONE DETECTED   Tetrahydrocannabinol NONE DETECTED NONE DETECTED   Barbiturates NONE DETECTED NONE DETECTED    Comment:        DRUG SCREEN FOR MEDICAL PURPOSES ONLY.  IF CONFIRMATION IS NEEDED FOR ANY PURPOSE, NOTIFY LAB WITHIN 5 DAYS.        LOWEST DETECTABLE LIMITS FOR URINE DRUG SCREEN Drug Class       Cutoff (ng/mL) Amphetamine      1000 Barbiturate      200 Benzodiazepine   962 Tricyclics       229 Opiates          300 Cocaine          300 THC              50   CBG monitoring, ED     Status: None   Collection Time: 03/28/15  7:59 AM  Result Value Ref Range   Glucose-Capillary 87 65 - 99 mg/dL    Vitals: Blood pressure 104/62, pulse 85, temperature 98.8 F (37.1 C), temperature source Oral, resp. rate 18, SpO2  100 %.  Risk to Self: Suicidal Ideation: No Suicidal Intent: No Is patient at risk for suicide?: No Suicidal Plan?: No Access to Means: No What has been your use of drugs/alcohol within the last 12 months?: Social drinking only, per pt How many times?: 0 Other Self Harm Risks: None known Triggers for Past Attempts: Unknown Intentional Self Injurious Behavior: None Risk to Others: Homicidal Ideation: No Thoughts of Harm to Others: No Current Homicidal Intent: No Current Homicidal Plan: No Access to Homicidal Means: No Identified Victim: n/a History of harm to others?: Yes Assessment of Violence: None Noted Violent Behavior Description: Pt calm and cooperative; No known hx of violence Does patient have access to weapons?: No Criminal Charges Pending?: No Does patient have a court date: No Prior Inpatient Therapy: Prior Inpatient Therapy: Yes Prior Therapy Dates: Unknown, but within the last couple of years Prior Therapy Facilty/Provider(s): [facility] Reason for Treatment: PTSD Prior Outpatient Therapy: Prior Outpatient Therapy: Yes Prior Therapy Dates: Ongoing Prior Therapy Facilty/Provider(s): [facility] Reason for Treatment: PTSD Does patient have an ACCT team?: No Does patient have Intensive In-House Services?  : No Does patient have [facility] services? : No Does patient have P4CC services?: No  Current Facility-Administered Medications  Medication Dose Route Frequency Provider Last Rate Last Dose  . metFORMIN (GLUCOPHAGE) tablet 500 mg  500 mg Oral Q breakfast Junius Creamer, NP   500 mg at 03/28/15 0942  . naltrexone (DEPADE)  tablet 50 mg  50 mg Oral Daily Junius Creamer, NP   50 mg at 03/28/15 0942  . OLANZapine (ZYPREXA) tablet 10 mg  10 mg Oral QHS Junius Creamer, NP   10 mg at 03/28/15 0205  . thiamine (VITAMIN B-1) tablet 100 mg  100 mg Oral Daily Junius Creamer, NP   100 mg at 03/28/15 6237   Current Outpatient Prescriptions  Medication Sig Dispense Refill  . acetaminophen  (TYLENOL) 325 MG tablet Take 650 mg by mouth every 6 (six) hours as needed for mild pain.    . cetaphil (CETAPHIL) cream Apply 1 application topically daily as needed (dry skin). Apply after bathing    . fluticasone (FLONASE) 50 MCG/ACT nasal spray Place 1 spray into both nostrils at bedtime.    . folic acid (FOLVITE) 1 MG tablet Take 1 mg by mouth daily.    . Melatonin 3 MG TABS Take 1 tablet by mouth at bedtime.    . metFORMIN (GLUCOPHAGE) 500 MG tablet Take 500 mg by mouth daily with breakfast.    . Multiple Vitamins-Minerals (MULTIVITAMIN ADULT PO) Take 1 tablet by mouth daily.    . naltrexone (DEPADE) 50 MG tablet Take 50 mg by mouth daily.    Marland Kitchen OLANZapine (ZYPREXA) 10 MG tablet Take 10 mg by mouth at bedtime.    . Omega-3 Fatty Acids (FISH OIL PO) Take 1 tablet by mouth daily.    Marland Kitchen thiamine 100 MG tablet Take 100 mg by mouth daily.      Musculoskeletal: Strength & Muscle Tone: within normal limits Gait & Station: normal Patient leans: N/A  Psychiatric Specialty Exam: Physical Exam  HENT:  Right Ear: External ear normal.    Review of Systems  Constitutional: Negative.   HENT: Negative.   Eyes: Negative.   Respiratory: Negative.   Cardiovascular: Negative.   Gastrointestinal: Negative.   Genitourinary: Negative.   Musculoskeletal: Negative.   Skin: Negative.   Neurological: Negative.   Endo/Heme/Allergies: Negative.     Blood pressure 104/62, pulse 85, temperature 98.8 F (37.1 C), temperature source Oral, resp. rate 18, SpO2 100 %.There is no weight on file to calculate BMI.  General Appearance: Casual  Eye Contact::  Minimal  Speech:  Blocked  Volume:  Normal  Mood:  Depressed  Affect:  Blunt and Restricted  Thought Process:  Disorganized and Tangential  Orientation:  Full (Time, Place, and Person)  Thought Content:  Rumination  Suicidal Thoughts:  No  Homicidal Thoughts:  No  Memory:  Immediate;   Fair Recent;   Poor Remote;   Poor  Judgement:  Impaired   Insight:  Shallow  Psychomotor Activity:  Normal  Concentration:  Fair  Recall:  Poor  Fund of Knowledge:Poor  Language: Fair  Akathisia:  NA  Handed:  Right  AIMS (if indicated):     Assets:  Desire for Improvement  ADL's:  Intact  Cognition: WNL  Sleep:      Medical Decision Making: Review of Psycho-Social Stressors (1), Established Problem, Worsening (2), Review of Medication Regimen & Side Effects (2) and Review of New Medication or Change in Dosage (2)  Treatment Plan Summary: Daily contact with patient to assess and evaluate symptoms and progress in treatment and Medication management  Plan:  We will resume his home medications with changes made.  Patient will start Prolixin 5 mg po bid for Psychosis/Schizophrenic Spectrum syndrome.  Patient will need a Monthly injectable antipsychotic in the future due to non compliance. Cogentin 1 mg po  at bed time for EPS, Naltrexone 50 mg po daily for Alcohol craving and Trazodone 50 mg po every night for sleep.    Disposition: Admit and seek placement at any facility with available inpatient Psychiatric unit.  Delfin Gant   PMHNP-BC 03/28/2015 12:26 PM   Patient seen and I agree with treatment plan  Levonne Spiller M.D.

## 2015-03-28 NOTE — ED Notes (Addendum)
Stretcher in lowest position, Call bell within reach of patient. Will continue to monitor accordingly with security cameras and q 15 min monitoring.    

## 2015-03-28 NOTE — ED Notes (Signed)
Pt. Noted undressing in his room. Pt. Encouraged to put his cloths on.

## 2015-03-28 NOTE — ED Notes (Signed)
Pt. In shower at this time.

## 2015-03-28 NOTE — ED Notes (Signed)
Patient assessed, noted to be alert and oriented. Patient is resting comfortably at this time.  Patient denies SI, HI, and AVH. Stretcher in lowest position, Call bell within reach of patient. Will continue to monitor accordingly with security cameras and q 15 min monitoring.

## 2015-03-28 NOTE — ED Notes (Signed)
Patient assessed, patient resting on stretcher at this time. Patient alert and oriented. Patient denies SI, HI, AVH. Stretcher in lowest position, call bell within reach, patient monitored q 15 min checks and security cameras. Will continue to monitor.

## 2015-03-28 NOTE — Progress Notes (Signed)
CSW received contact from [facility] stating they were denying the transfer due to medical acuity.  They stated that, "He has a number of medical issues such as acute encephalopathy.  [name] [facility] Behavioral Health Disposition CSW [phone]

## 2015-03-28 NOTE — ED Notes (Signed)
Stretcher in lowest position, Call bell within reach of patient. Will continue to monitor accordingly with security cameras and q 15 min monitoring.    

## 2015-03-28 NOTE — BHH Counselor (Signed)
TTS Counselor spoke with pt's mother [name], [phone]) in order to gain collateral information; she reports bizarre and psychotic behavior over the past few weeks, such as the pt walking around the house naked, talking to himself, and "rambling on" to the extent that no one can follow his train of thought or understand what he is saying. Per mom, pt is not compliant with his medications and she does not administer them to him daily, as the pt had claimed to the counselor. Pt's mother stated that she does not even know what medications he is prescribed.   Pt's mother added that the pt drinks etoh daily, which is why he cannot return to her home to live with her after d/c. Therefore, a Social Work consult will need to be made in order to provide pt with alternative resources.   Cyndie Mull, [facility] Triage Specialist

## 2015-03-28 NOTE — ED Notes (Signed)
Up to the bathroom 

## 2015-03-28 NOTE — ED Notes (Addendum)
Patient alert and oriented, watching TV at this time.  Patient denies SI, HI, AVH. Stretcher in lowest position, Call bell within reach of patient. Will continue to monitor accordingly with security cameras and q 15 min monitoring.    

## 2015-03-28 NOTE — ED Notes (Signed)
Patient alert and oriented, watching TV at this time.  Patient denies SI, HI, AVH. Stretcher in lowest position, Call bell within reach of patient. Will continue to monitor accordingly with security cameras and q 15 min monitoring.    

## 2015-03-28 NOTE — ED Notes (Signed)
Patient has had 3 BM's in the past 24 hours.

## 2015-03-28 NOTE — ED Notes (Addendum)
Patient is resting at this time, Patient has no complaints nor distress at this time. Call bell within reach, stretcher in lowest position, security cameras and q 15 min checks are monitoring patient. Will continue to monitor at this time.   

## 2015-03-28 NOTE — ED Notes (Signed)
Pt. Noted in room sitting in chair with his face just inches from the wall looking at something. No complaints or concerns voiced. No acute distress noted. Will continue to monitor with security cameras. Q 15 minute rounds continue.

## 2015-03-28 NOTE — ED Notes (Signed)
Dr Romeo Apple updated-continue to monitor, if occurrs again send stool for occult blood.

## 2015-03-28 NOTE — ED Notes (Signed)
Pt up to the bathroom, scant amount of stool note.  Pt reports that he was able to get to the bathroom w/ soiling self. Specimen collected for hem.QNS for c-diff.

## 2015-03-28 NOTE — ED Notes (Addendum)
Pt sitting quietly in room watching tv alert/oriented. Verbal permission from patient to give information to his mother.  She and the patient are aware that he is not going to be dc'd.

## 2015-03-28 NOTE — ED Notes (Signed)
Pt. Noted in room. No complaints or concerns voiced. No acute distress noted. Will continue to monitor with security cameras. Q 15 minute rounds continue. 

## 2015-03-28 NOTE — ED Notes (Signed)
Patients mother:  Janard Gaither [phone]

## 2015-03-28 NOTE — ED Notes (Signed)
Report received from Janie Rambo RN. Pt. Alert and oriented in no distress denies SI, HI, AVH and pain.  Pt. Instructed to come to me with problems or concerns.Will continue to monitor for safety via security cameras and Q 15 minute checks. 

## 2015-03-28 NOTE — Progress Notes (Signed)
Disposition CSW contacted the [facility], [facility], [facility], and [facility] as patient is a Cytogeneticist.  All four [facility] reported they were at capacity for psychiatric beds.  CSW then completed referrals to the following facilities for inpatient psych placement:  [facility]  CSW will continue to assist patient with disposition needs.  [name] [facility] Behavioral Health Disposition CSW [phone]

## 2015-03-28 NOTE — BHH Counselor (Signed)
2:55PM LCSW contacted AOD at [facility] at [phone] to request patients previous medications who requested a release of information, ROI was faxed to [phone], Reynold Bowen who is the AOD. LCSW contacted Onalee Hua to verify receipt. Onalee Hua states that he received the ROI and will fax over the patients last discharge summary with list of medications. Disposition SW states that the [facility] does not currently have beds at this time. TTS will seek placement at other facilities.

## 2015-03-28 NOTE — ED Notes (Signed)
Patient is resting at this time, Patient has no complaints nor distress at this time. Call bell within reach, stretcher in lowest position, security cameras and q 15 min checks are monitoring patient. Will continue to monitor at this time.   

## 2015-03-29 DIAGNOSIS — F29 Unspecified psychosis not due to a substance or known physiological condition: Secondary | ICD-10-CM | POA: Insufficient documentation

## 2015-03-29 LAB — CBG MONITORING, ED: Glucose-Capillary: 92 mg/dL (ref 65–99)

## 2015-03-29 LAB — CLOSTRIDIUM DIFFICILE BY PCR: Toxigenic C. Difficile by PCR: POSITIVE — AB

## 2015-03-29 MED ORDER — METRONIDAZOLE 500 MG PO TABS
500.0000 mg | ORAL_TABLET | Freq: Three times a day (TID) | ORAL | Status: DC
  Administered 2015-03-29 – 2015-03-31 (×7): 500 mg via ORAL
  Filled 2015-03-29 (×6): qty 1

## 2015-03-29 NOTE — ED Notes (Signed)
Up to the bathroom 

## 2015-03-29 NOTE — Consult Note (Signed)
[facility] Consult   Reason for Consult:  Psychosis, Schizophrenia, R/O, Chronic PTSD Referring Physician:  EDP Patient Identification: Ross [name] MRN:  [id number] Principal Diagnosis: Psychosis Diagnosis:   Patient Active Problem List   Diagnosis Date Noted  . Psychosis [F29] 03/28/2015    Priority: High  . HTN (hypertension) [I10] 09/12/2011  . Aspiration pneumonia [J69.0] 09/11/2011  . RUQ abdominal pain [R10.11] 09/11/2011  . SOB (shortness of breath) [R06.02] 09/11/2011  . Pneumonia [J18.9] 09/04/2011  . Fever [R50.9] 09/04/2011  . Acute pancreatitis [K85.9] 09/03/2011  . Abdominal pain [789.0] 09/03/2011  . Acute encephalopathy [G93.40] 08/28/2011  . Hypernatremia [E87.0] 08/27/2011  . Seizure [R56.9] 08/23/2011  . Poisoning by anticholinergic drug [T44.3X1A] 08/23/2011  . Eczema [L30.9] 08/23/2011  . Papular rash, generalized [R21] 08/23/2011  . HTN (hypertension), malignant [I10] 08/23/2011  . Hyperglycemia [R73.9] 08/23/2011  . PTSD (post-traumatic stress disorder) [F43.10] 08/23/2011  . Cardiac arrest [I46.9] 08/23/2011  . QT prolongation [I45.81] 08/23/2011    Total Time spent with patient: 1 hour  Subjective:   Ross [name] is a 49 y.o. male patient admitted with Psychosis, Schizophrenia, R/O, Chronic PTSD.  HPI:  AA male was evaluated this morning for bizarre behavior at the jail where he was yesterday.  Patient was brought in from [place] where he was for for DUI under IVC.  Patient was trying to flush his clothing in the toilet at the jail.  This morning during our assessment patient was finding it difficult to put his words together.  He admitted to a diagnosis of PTSD after his [place] war service.  He also admitted to a diagnosis of anxiety and drinking problem.  He reports drinking a fifth a day.  He also uses Marijuana occasionally according to him.  He reported that he has not taken his MH medications for 10 days because he was in  jail.  He did not know the names of his medications.  He reports receving care at the [facility] in [place] and that he was hospitalized at the [facility] 30 days ago.  He could not remember why he was hospitalized.  Collateral information from his mother states that Patient has been found in the house naked.  He has been non compliant with his medications.  He denied SI/HI/AVH and has been accepted for admission.   Please see Collateral information from his sister this afternoon.  Seen today on rounds still confused.  Patient was not able to explain how he ended up in [place] [facility] [place] where he receives care from the [facility].  Patient stated that his family does not want want him to continue getting treatment at the [facility] because [facility] is not able to assist him with housing, transportation and his other needs. He could not explain his trip to [place] instead of traveling from [place] to [place] to see his father.  Patient is still up for admission and is waiting for bed.  He denies SI/HI/AVH.  Patient is moved back to TCU for testing positive for C-Difficile infection.  Psychiatry will continue to follow patient until we secure an admission bed.  HPI Elements:   Location:  Psychosis, Schizophrenia Spectrum disorder.. Quality:  severe. Severity:  severe. Timing:  Acute. Duration:  Chronic Mental illness.. Context:  IVC for bizarre behavior and intoxication.  Past Medical History:  Past Medical History  Diagnosis Date  . Sleep apnea     had surgery to correct it  . Mental disorder  PTSD  . Seizures     currently dx with seizures  . Cardiac arrest 08/23/2011  . Pneumonia 09/04/2011  . Pancreatitis   . SOB (shortness of breath) 09/11/2011  . Hypertension   . Anxiety     Past Surgical History  Procedure Laterality Date  . Tonsillectomy    . Uvulopalatopharyngoplasty, tonsillectomy and septoplasty  06/16/2000  . Left heart catheterization with coronary angiogram N/A 08/24/2011     Procedure: LEFT HEART CATHETERIZATION WITH CORONARY ANGIOGRAM;  Surgeon: Jettie Booze, MD;  Location: [facility] CATH LAB;  Service: Cardiovascular;  Laterality: N/A;   Family History:  Family History  Problem Relation Age of Onset  . Heart attack Mother    Social History:  History  Alcohol Use  . Yes    Comment: occ     History  Drug Use No    History   Social History  . Marital Status: Married    Spouse Name: N/A  . Number of Children: N/A  . Years of Education: N/A   Social History Main Topics  . Smoking status: Former Smoker    Types: Cigarettes  . Smokeless tobacco: Never Used  . Alcohol Use: Yes     Comment: occ  . Drug Use: No  . Sexual Activity: Not Currently   Other Topics Concern  . None   Social History Narrative   Additional Social History:    Pain Medications: See PTA List Prescriptions: See PTA List Over the Counter: See PTA List History of alcohol / drug use?: No history of alcohol / drug abuse   Allergies:   Allergies  Allergen Reactions  . Benadryl [Diphenhydramine Hcl]     anaphylaxis  . Antihistamines, Diphenhydramine-Type     Rash/eczema     Labs:  Results for orders placed or performed during the hospital encounter of 03/27/15 (from the past 48 hour(s))  CBG monitoring, ED     Status: None   Collection Time: 03/27/15  7:24 PM  Result Value Ref Range   Glucose-Capillary 98 65 - 99 mg/dL  Acetaminophen level     Status: Abnormal   Collection Time: 03/27/15  7:37 PM  Result Value Ref Range   Acetaminophen (Tylenol), Serum <10 (L) 10 - 30 ug/mL    Comment:        THERAPEUTIC CONCENTRATIONS VARY SIGNIFICANTLY. A RANGE OF 10-30 ug/mL MAY BE AN EFFECTIVE CONCENTRATION FOR MANY PATIENTS. HOWEVER, SOME ARE BEST TREATED AT CONCENTRATIONS OUTSIDE THIS RANGE. ACETAMINOPHEN CONCENTRATIONS >150 ug/mL AT 4 HOURS AFTER INGESTION AND >50 ug/mL AT 12 HOURS AFTER INGESTION ARE OFTEN ASSOCIATED WITH TOXIC REACTIONS.   Ethanol (ETOH)      Status: None   Collection Time: 03/27/15  7:37 PM  Result Value Ref Range   Alcohol, Ethyl (B) <5 <5 mg/dL    Comment:        LOWEST DETECTABLE LIMIT FOR SERUM ALCOHOL IS 5 mg/dL FOR MEDICAL PURPOSES ONLY   Salicylate level     Status: None   Collection Time: 03/27/15  7:37 PM  Result Value Ref Range   Salicylate Lvl <8.4 2.8 - 30.0 mg/dL  CBC     Status: Abnormal   Collection Time: 03/27/15  7:38 PM  Result Value Ref Range   WBC 12.2 (H) 4.0 - 10.5 K/uL   RBC 5.18 4.22 - 5.81 MIL/uL   Hemoglobin 14.7 13.0 - 17.0 g/dL   HCT 44.7 39.0 - 52.0 %   MCV 86.3 78.0 - 100.0 fL   MCH  28.4 26.0 - 34.0 pg   MCHC 32.9 30.0 - 36.0 g/dL   RDW 15.0 11.5 - 15.5 %   Platelets 169 150 - 400 K/uL  Comprehensive metabolic panel     Status: Abnormal   Collection Time: 03/27/15  7:38 PM  Result Value Ref Range   Sodium 136 135 - 145 mmol/L   Potassium 3.6 3.5 - 5.1 mmol/L   Chloride 99 (L) 101 - 111 mmol/L   CO2 27 22 - 32 mmol/L   Glucose, Bld 103 (H) 65 - 99 mg/dL   BUN 17 6 - 20 mg/dL   Creatinine, Ser 1.12 0.61 - 1.24 mg/dL   Calcium 9.6 8.9 - 10.3 mg/dL   Total Protein 8.4 (H) 6.5 - 8.1 g/dL   Albumin 4.5 3.5 - 5.0 g/dL   AST 35 15 - 41 U/L   ALT 17 17 - 63 U/L   Alkaline Phosphatase 79 38 - 126 U/L   Total Bilirubin 1.3 (H) 0.3 - 1.2 mg/dL   GFR calc non Af Amer >60 >60 mL/min   GFR calc Af Amer >60 >60 mL/min    Comment: (NOTE) The eGFR has been calculated using the CKD EPI equation. This calculation has not been validated in all clinical situations. eGFR's persistently <60 mL/min signify possible Chronic Kidney Disease.    Anion gap 10 5 - 15  Urine rapid drug screen (hosp performed)not at [facility]     Status: None   Collection Time: 03/27/15 10:06 PM  Result Value Ref Range   Opiates NONE DETECTED NONE DETECTED   Cocaine NONE DETECTED NONE DETECTED   Benzodiazepines NONE DETECTED NONE DETECTED   Amphetamines NONE DETECTED NONE DETECTED   Tetrahydrocannabinol NONE DETECTED  NONE DETECTED   Barbiturates NONE DETECTED NONE DETECTED    Comment:        DRUG SCREEN FOR MEDICAL PURPOSES ONLY.  IF CONFIRMATION IS NEEDED FOR ANY PURPOSE, NOTIFY LAB WITHIN 5 DAYS.        LOWEST DETECTABLE LIMITS FOR URINE DRUG SCREEN Drug Class       Cutoff (ng/mL) Amphetamine      1000 Barbiturate      200 Benzodiazepine   749 Tricyclics       449 Opiates          300 Cocaine          300 THC              50   CBG monitoring, ED     Status: None   Collection Time: 03/28/15  7:59 AM  Result Value Ref Range   Glucose-Capillary 87 65 - 99 mg/dL  Occult blood card to lab, stool Provider will collect     Status: None   Collection Time: 03/28/15  6:10 PM  Result Value Ref Range   Fecal Occult Bld NEGATIVE NEGATIVE  Clostridium Difficile by PCR (not at [facility])     Status: Abnormal   Collection Time: 03/29/15  7:24 AM  Result Value Ref Range   C difficile by pcr POSITIVE (A) NEGATIVE    Comment: CRITICAL RESULT CALLED TO, READ BACK BY AND VERIFIED WITH: RAMBO,J AT 1230 ON [id number] BY HOOKER,B Performed at [facility]   CBG monitoring, ED     Status: None   Collection Time: 03/29/15  7:59 AM  Result Value Ref Range   Glucose-Capillary 92 65 - 99 mg/dL    Vitals: Blood pressure 117/73, pulse 94, temperature 98.4 F (36.9 C), temperature source  Oral, resp. rate 16, SpO2 100 %.  Risk to Self: Suicidal Ideation: No Suicidal Intent: No Is patient at risk for suicide?: No Suicidal Plan?: No Access to Means: No What has been your use of drugs/alcohol within the last 12 months?: Social drinking only, per pt How many times?: 0 Other Self Harm Risks: None known Triggers for Past Attempts: Unknown Intentional Self Injurious Behavior: None Risk to Others: Homicidal Ideation: No Thoughts of Harm to Others: No Current Homicidal Intent: No Current Homicidal Plan: No Access to Homicidal Means: No Identified Victim: n/a History of harm to others?: Yes Assessment of  Violence: None Noted Violent Behavior Description: Pt calm and cooperative; No known hx of violence Does patient have access to weapons?: No Criminal Charges Pending?: No Does patient have a court date: No Prior Inpatient Therapy: Prior Inpatient Therapy: Yes Prior Therapy Dates: Unknown, but within the last couple of years Prior Therapy Facilty/Provider(s): [facility] Reason for Treatment: PTSD Prior Outpatient Therapy: Prior Outpatient Therapy: Yes Prior Therapy Dates: Ongoing Prior Therapy Facilty/Provider(s): [facility] Reason for Treatment: PTSD Does patient have an ACCT team?: No Does patient have Intensive In-House Services?  : No Does patient have [facility] services? : No Does patient have P4CC services?: No  Current Facility-Administered Medications  Medication Dose Route Frequency Provider Last Rate Last Dose  . benztropine (COGENTIN) tablet 1 mg  1 mg Oral QHS Delfin Gant, NP   1 mg at 03/28/15 2148  . fluPHENAZine (PROLIXIN) tablet 5 mg  5 mg Oral BID Delfin Gant, NP   5 mg at 03/29/15 1023  . loperamide (IMODIUM) capsule 4 mg  4 mg Oral TID PRN Pamella Pert, MD   4 mg at 03/28/15 1535  . metFORMIN (GLUCOPHAGE) tablet 500 mg  500 mg Oral Q breakfast Junius Creamer, NP   500 mg at 03/29/15 0855  . metroNIDAZOLE (FLAGYL) tablet 500 mg  500 mg Oral 3 times per day Orlie Dakin, MD      . naltrexone (DEPADE) tablet 50 mg  50 mg Oral Daily Junius Creamer, NP   50 mg at 03/29/15 1023  . thiamine (VITAMIN B-1) tablet 100 mg  100 mg Oral Daily Junius Creamer, NP   100 mg at 03/29/15 1023  . traZODone (DESYREL) tablet 50 mg  50 mg Oral QHS Delfin Gant, NP   50 mg at 03/28/15 2148   Current Outpatient Prescriptions  Medication Sig Dispense Refill  . acetaminophen (TYLENOL) 325 MG tablet Take 650 mg by mouth every 6 (six) hours as needed for mild pain.    . cetaphil (CETAPHIL) cream Apply 1 application topically daily as needed (dry skin). Apply after bathing    .  fluticasone (FLONASE) 50 MCG/ACT nasal spray Place 1 spray into both nostrils at bedtime.    . folic acid (FOLVITE) 1 MG tablet Take 1 mg by mouth daily.    . Melatonin 3 MG TABS Take 1 tablet by mouth at bedtime.    . metFORMIN (GLUCOPHAGE) 500 MG tablet Take 500 mg by mouth daily with breakfast.    . Multiple Vitamins-Minerals (MULTIVITAMIN ADULT PO) Take 1 tablet by mouth daily.    . naltrexone (DEPADE) 50 MG tablet Take 50 mg by mouth daily.    Marland Kitchen OLANZapine (ZYPREXA) 10 MG tablet Take 10 mg by mouth at bedtime.    . Omega-3 Fatty Acids (FISH OIL PO) Take 1 tablet by mouth daily.    Marland Kitchen thiamine 100 MG tablet Take 100 mg  by mouth daily.      Musculoskeletal: Strength & Muscle Tone: within normal limits Gait & Station: normal Patient leans: N/A  Psychiatric Specialty Exam: Physical Exam  HENT:  Right Ear: External ear normal.    Review of Systems  Constitutional: Negative.   HENT: Negative.   Eyes: Negative.   Respiratory: Negative.   Cardiovascular: Negative.   Gastrointestinal: Negative.   Genitourinary: Negative.   Musculoskeletal: Negative.   Skin: Negative.   Neurological: Negative.   Endo/Heme/Allergies: Negative.     Blood pressure 117/73, pulse 94, temperature 98.4 F (36.9 C), temperature source Oral, resp. rate 16, SpO2 100 %.There is no weight on file to calculate BMI.  General Appearance: Casual  Eye Contact::  Minimal  Speech:  Blocked  Volume:  Normal  Mood:  Depressed  Affect:  Blunt and Restricted  Thought Process:  Disorganized and Tangential  Orientation:  Full (Time, Place, and Person)  Thought Content:  Rumination  Suicidal Thoughts:  No  Homicidal Thoughts:  No  Memory:  Immediate;   Fair Recent;   Poor Remote;   Poor  Judgement:  Impaired  Insight:  Shallow  Psychomotor Activity:  Normal  Concentration:  Fair  Recall:  Poor  Fund of Knowledge:Poor  Language: Fair  Akathisia:  NA  Handed:  Right  AIMS (if indicated):     Assets:  Desire  for Improvement  ADL's:  Intact  Cognition: WNL  Sleep:      Medical Decision Making: Review of Psycho-Social Stressors (1), Established Problem, Worsening (2), Review of Medication Regimen & Side Effects (2) and Review of New Medication or Change in Dosage (2)  Treatment Plan Summary: Daily contact with patient to assess and evaluate symptoms and progress in treatment and Medication management  Plan:  Continue all medications as prescribed with no changes made today.   Disposition: Admit and seek placement at any facility with available inpatient Psychiatric unit.  Delfin Gant   PMHNP-BC 03/29/2015 1:58 PM    Patient seen and I agree with treatment and plan  Levonne Spiller M.D.

## 2015-03-29 NOTE — ED Notes (Signed)
Pt. Noted sleeping in room. No complaints or concerns voiced. No distress or abnormal behavior noted. Will continue to monitor with security cameras. Q 15 minute rounds continue. 

## 2015-03-29 NOTE — ED Notes (Signed)
Pt. Noted in room. No complaints or concerns voiced. No distress or abnormal behavior noted. Will continue to monitor with security cameras. Q 15 minute rounds continue. 

## 2015-03-29 NOTE — ED Notes (Signed)
Bed: WA29 Expected date:  Expected time:  Means of arrival:  Comments: 

## 2015-03-29 NOTE — ED Notes (Signed)
Pt ambulatory to room 29 report to [employer]

## 2015-03-29 NOTE — ED Notes (Signed)
Julieanne Cotton NP aware refer to edp-Dr Shela Commons

## 2015-03-29 NOTE — ED Notes (Signed)
ED charge aware  And will move when room is available.

## 2015-03-30 DIAGNOSIS — F4312 Post-traumatic stress disorder, chronic: Secondary | ICD-10-CM | POA: Diagnosis not present

## 2015-03-30 DIAGNOSIS — F29 Unspecified psychosis not due to a substance or known physiological condition: Secondary | ICD-10-CM | POA: Diagnosis not present

## 2015-03-30 DIAGNOSIS — F209 Schizophrenia, unspecified: Secondary | ICD-10-CM

## 2015-03-30 NOTE — ED Notes (Signed)
Provided patient another set of paper scrubs due to patient having a bowel movement. Pt performed his own personal hygiene.

## 2015-03-30 NOTE — Consult Note (Signed)
[facility] Face-to-Face Psychiatry Consult   Reason for Consult:  Psychosis, Schizophrenia, R/O, Chronic PTSD Referring Physician:  EDP Patient Identification: Ross [name] MRN:  [id number] Principal Diagnosis: Psychosis Diagnosis:   Patient Active Problem List   Diagnosis Date Noted  . Psychoses [F29]   . Psychosis [F29] 03/28/2015  . HTN (hypertension) [I10] 09/12/2011  . Aspiration pneumonia [J69.0] 09/11/2011  . RUQ abdominal pain [R10.11] 09/11/2011  . SOB (shortness of breath) [R06.02] 09/11/2011  . Pneumonia [J18.9] 09/04/2011  . Fever [R50.9] 09/04/2011  . Acute pancreatitis [K85.9] 09/03/2011  . Abdominal pain [789.0] 09/03/2011  . Acute encephalopathy [G93.40] 08/28/2011  . Hypernatremia [E87.0] 08/27/2011  . Seizure [R56.9] 08/23/2011  . Poisoning by anticholinergic drug [T44.3X1A] 08/23/2011  . Eczema [L30.9] 08/23/2011  . Papular rash, generalized [R21] 08/23/2011  . HTN (hypertension), malignant [I10] 08/23/2011  . Hyperglycemia [R73.9] 08/23/2011  . PTSD (post-traumatic stress disorder) [F43.10] 08/23/2011  . Cardiac arrest [I46.9] 08/23/2011  . QT prolongation [I45.81] 08/23/2011    Total Time spent with patient: 25 minutes  Subjective:   Ross [name] is a 49 y.o. male patient admitted with Psychosis, Schizophrenia, R/O, Chronic PTSD. Pt reportedly naked and smearing feces all over the walls in the jail. Pt appears to be responding to internal stimuli with paranoia when assessed today. Continues to meet inpatient criteria.   HPI:  AA male was evaluated this morning for bizarre behavior at the jail where he was yesterday.  Patient was brought in from [place] where he was for for DUI under IVC.  Patient was trying to flush his clothing in the toilet at the jail.  This morning during our assessment patient was finding it difficult to put his words together.  He admitted to a diagnosis of PTSD after his [place] war service.  He also admitted to a diagnosis of anxiety  and drinking problem.  He reports drinking a fifth a day.  He also uses Marijuana occasionally according to him.  He reported that he has not taken his MH medications for 10 days because he was in jail.  He did not know the names of his medications.  He reports receving care at the [place] in [place] and that he was hospitalized at the [facility] 30 days ago.  He could not remember why he was hospitalized.  Collateral information from his mother states that Patient has been found in the house naked.  He has been non compliant with his medications.  He denied SI/HI/AVH and has been accepted for admission.   Please see Collateral information from his sister this afternoon.  Seen today on rounds still confused.  Patient was not able to explain how he ended up in [facility] [place] where he receives care from the [place] [facility].  Patient stated that his family does not want want him to continue getting treatment at the [place] because [facility] is not able to assist him with housing, transportation and his other needs. He could not explain his trip to [place] instead of traveling from [place] to [place] to see his father.  Patient is still up for admission and is waiting for bed.  He denies SI/HI/AVH.  Patient is moved back to TCU for testing positive for C-Difficile infection.  Psychiatry will continue to follow patient until we secure an admission bed.  HPI Elements:   Location:  Psychosis, Schizophrenia Spectrum disorder.. Quality:  severe. Severity:  severe. Timing:  Acute. Duration:  Chronic Mental illness.. Context:  IVC for  bizarre behavior and intoxication.  Past Medical History:  Past Medical History  Diagnosis Date  . Sleep apnea     had surgery to correct it  . Mental disorder     PTSD  . Seizures     currently dx with seizures  . Cardiac arrest 08/23/2011  . Pneumonia 09/04/2011  . Pancreatitis   . SOB (shortness of breath) 09/11/2011  . Hypertension   . Anxiety     Past Surgical  History  Procedure Laterality Date  . Tonsillectomy    . Uvulopalatopharyngoplasty, tonsillectomy and septoplasty  06/16/2000  . Left heart catheterization with coronary angiogram N/A 08/24/2011    Procedure: LEFT HEART CATHETERIZATION WITH CORONARY ANGIOGRAM;  Surgeon: Corky Crafts, MD;  Location: [facility] CATH LAB;  Service: Cardiovascular;  Laterality: N/A;   Family History:  Family History  Problem Relation Age of Onset  . Heart attack Mother    Social History:  History  Alcohol Use  . Yes    Comment: occ     History  Drug Use No    History   Social History  . Marital Status: Married    Spouse Name: N/A  . Number of Children: N/A  . Years of Education: N/A   Social History Main Topics  . Smoking status: Former Smoker    Types: Cigarettes  . Smokeless tobacco: Never Used  . Alcohol Use: Yes     Comment: occ  . Drug Use: No  . Sexual Activity: Not Currently   Other Topics Concern  . None   Social History Narrative   Additional Social History:    Pain Medications: See PTA List Prescriptions: See PTA List Over the Counter: See PTA List History of alcohol / drug use?: No history of alcohol / drug abuse   Allergies:   Allergies  Allergen Reactions  . Benadryl [Diphenhydramine Hcl]     anaphylaxis  . Antihistamines, Diphenhydramine-Type     Rash/eczema     Labs:  Results for orders placed or performed during the hospital encounter of 03/27/15 (from the past 48 hour(s))  Occult blood card to lab, stool Provider will collect     Status: None   Collection Time: 03/28/15  6:10 PM  Result Value Ref Range   Fecal Occult Bld NEGATIVE NEGATIVE  Clostridium Difficile by PCR (not at [facility])     Status: Abnormal   Collection Time: 03/29/15  7:24 AM  Result Value Ref Range   C difficile by pcr POSITIVE (A) NEGATIVE    Comment: CRITICAL RESULT CALLED TO, READ BACK BY AND VERIFIED WITH: RAMBO,J AT 1230 ON [id number] BY HOOKER,B Performed at [facility]   CBG  monitoring, ED     Status: None   Collection Time: 03/29/15  7:59 AM  Result Value Ref Range   Glucose-Capillary 92 65 - 99 mg/dL    Vitals: Blood pressure 106/60, pulse 75, temperature 98.6 F (37 C), temperature source Oral, resp. rate 20, SpO2 98 %.  Risk to Self: Suicidal Ideation: No Suicidal Intent: No Is patient at risk for suicide?: No Suicidal Plan?: No Access to Means: No What has been your use of drugs/alcohol within the last 12 months?: Social drinking only, per pt How many times?: 0 Other Self Harm Risks: None known Triggers for Past Attempts: Unknown Intentional Self Injurious Behavior: None Risk to Others: Homicidal Ideation: No Thoughts of Harm to Others: No Current Homicidal Intent: No Current Homicidal Plan: No Access to Homicidal Means: No Identified Victim:  n/a History of harm to others?: Yes Assessment of Violence: None Noted Violent Behavior Description: Pt calm and cooperative; No known hx of violence Does patient have access to weapons?: No Criminal Charges Pending?: No Does patient have a court date: No Prior Inpatient Therapy: Prior Inpatient Therapy: Yes Prior Therapy Dates: Unknown, but within the last couple of years Prior Therapy Facilty/Provider(s): [place] Reason for Treatment: PTSD Prior Outpatient Therapy: Prior Outpatient Therapy: Yes Prior Therapy Dates: Ongoing Prior Therapy Facilty/Provider(s): [facility] [place] Reason for Treatment: PTSD Does patient have an ACCT team?: No Does patient have Intensive In-House Services?  : No Does patient have [facility] services? : No Does patient have P4CC services?: No  Current Facility-Administered Medications  Medication Dose Route Frequency Provider Last Rate Last Dose  . benztropine (COGENTIN) tablet 1 mg  1 mg Oral QHS Earney Navy, NP   1 mg at 03/29/15 2124  . fluPHENAZine (PROLIXIN) tablet 5 mg  5 mg Oral BID Earney Navy, NP   5 mg at 03/30/15 0946  . loperamide (IMODIUM) capsule 4  mg  4 mg Oral TID PRN Purvis Sheffield, MD   4 mg at 03/28/15 1535  . metFORMIN (GLUCOPHAGE) tablet 500 mg  500 mg Oral Q breakfast Earley Favor, NP   500 mg at 03/30/15 0839  . metroNIDAZOLE (FLAGYL) tablet 500 mg  500 mg Oral 3 times per day Doug Sou, MD   500 mg at 03/30/15 6045  . naltrexone (DEPADE) tablet 50 mg  50 mg Oral Daily Earley Favor, NP   50 mg at 03/30/15 0946  . thiamine (VITAMIN B-1) tablet 100 mg  100 mg Oral Daily Earley Favor, NP   100 mg at 03/30/15 0946  . traZODone (DESYREL) tablet 50 mg  50 mg Oral QHS Earney Navy, NP   50 mg at 03/29/15 2123   Current Outpatient Prescriptions  Medication Sig Dispense Refill  . acetaminophen (TYLENOL) 325 MG tablet Take 650 mg by mouth every 6 (six) hours as needed for mild pain.    . cetaphil (CETAPHIL) cream Apply 1 application topically daily as needed (dry skin). Apply after bathing    . fluticasone (FLONASE) 50 MCG/ACT nasal spray Place 1 spray into both nostrils at bedtime.    . folic acid (FOLVITE) 1 MG tablet Take 1 mg by mouth daily.    . Melatonin 3 MG TABS Take 1 tablet by mouth at bedtime.    . metFORMIN (GLUCOPHAGE) 500 MG tablet Take 500 mg by mouth daily with breakfast.    . Multiple Vitamins-Minerals (MULTIVITAMIN ADULT PO) Take 1 tablet by mouth daily.    . naltrexone (DEPADE) 50 MG tablet Take 50 mg by mouth daily.    Marland Kitchen OLANZapine (ZYPREXA) 10 MG tablet Take 10 mg by mouth at bedtime.    . Omega-3 Fatty Acids (FISH OIL PO) Take 1 tablet by mouth daily.    Marland Kitchen thiamine 100 MG tablet Take 100 mg by mouth daily.      Musculoskeletal: Strength & Muscle Tone: within normal limits Gait & Station: normal Patient leans: N/A  Psychiatric Specialty Exam: Physical Exam  HENT:  Right Ear: External ear normal.    Review of Systems  Constitutional: Negative.   HENT: Negative.   Eyes: Negative.   Respiratory: Negative.   Cardiovascular: Negative.   Gastrointestinal: Negative.   Genitourinary: Negative.    Musculoskeletal: Negative.   Skin: Negative.   Neurological: Negative.   Endo/Heme/Allergies: Negative.   Psychiatric/Behavioral: Positive for depression  and hallucinations (pt appears to be responding to internal stimuli). The patient is nervous/anxious and has insomnia.   All other systems reviewed and are negative.   Blood pressure 106/60, pulse 75, temperature 98.6 F (37 C), temperature source Oral, resp. rate 20, SpO2 98 %.There is no weight on file to calculate BMI.  General Appearance: Casual  Eye Contact::  Minimal  Speech:  Blocked  Volume:  Normal  Mood:  Depressed  Affect:  Blunt and Restricted  Thought Process:  Disorganized and Tangential ; IVC papers report pt naked and smearing feces all over the walls in the jail immediately prior to ED visit; sent to ED from jail, positive for C-Diff  Orientation:  Full (Time, Place, and Person)  Thought Content:  Rumination  Suicidal Thoughts:  No  Homicidal Thoughts:  No  Memory:  Immediate;   Fair Recent;   Poor Remote;   Poor  Judgement:  Impaired  Insight:  Shallow  Psychomotor Activity:  Normal  Concentration:  Fair  Recall:  Poor  Fund of Knowledge:Poor  Language: Fair  Akathisia:  NA  Handed:  Right  AIMS (if indicated):     Assets:  Desire for Improvement  ADL's:  Intact  Cognition: WNL  Sleep:      Medical Decision Making: Review of Psycho-Social Stressors (1), Established Problem, Worsening (2), Review of Medication Regimen & Side Effects (2) and Review of New Medication or Change in Dosage (2)  *I have reviewed plan below and concur with changes on 03/30/15 as follows:  Treatment Plan Summary: Daily contact with patient to assess and evaluate symptoms and progress in treatment and Medication management  Plan:  Continue all medications as prescribed with no changes made today.   Disposition: Admit and seek placement at any facility with available inpatient Psychiatric unit.  Beau Fanny,  FNP-BC 03/30/2015 12:56 PM   Patient seen face-to-face for psychiatric evaluation, chart reviewed and case discussed with the physician extender and developed treatment plan. Reviewed the information documented and agree with the treatment plan. Thedore Mins, MD

## 2015-03-30 NOTE — Progress Notes (Signed)
Pt with medicaid of [place] and is a veteran Pt is asleep when CM visited him.  Cm left pt with a list of [employer] providers on his bedside table Pt on contact precautions - c diff

## 2015-03-30 NOTE — ED Notes (Signed)
Pt woke up requesting his personal belongings and Benedetto Goad. Informed patient he could not leave the facility. Then, he requested something to eat and drink. Provided patient Sprite. Then, pt requested to speak with the "manager" due to wanting to see outside and "breath fresh air". Tiffany, RN (Press photographer) is speaking with patient at this time.

## 2015-03-30 NOTE — ED Notes (Signed)
Pt has ambulated to the bathroom several times tonight. Admits that bowel movements have slowed down and the output is not as much. Request meal and beverage.

## 2015-03-31 DIAGNOSIS — F29 Unspecified psychosis not due to a substance or known physiological condition: Secondary | ICD-10-CM | POA: Diagnosis not present

## 2015-03-31 LAB — CBG MONITORING, ED: Glucose-Capillary: 114 mg/dL — ABNORMAL HIGH (ref 65–99)

## 2015-03-31 MED ORDER — METRONIDAZOLE 500 MG PO TABS: 500.0000 mg | ORAL_TABLET | Freq: Three times a day (TID) | ORAL | Status: DC

## 2015-03-31 NOTE — Consult Note (Signed)
[facility] Face-to-Face Psychiatry Consult   Reason for Consult:  Psychosis, Schizophrenia, R/O, Chronic PTSD Referring Physician:  EDP Patient Identification: Ross [name] MRN:  [id number] Principal Diagnosis: Psychosis Diagnosis:   Patient Active Problem List   Diagnosis Date Noted  . Psychosis [F29] 03/28/2015    Priority: High  . Psychoses [F29]   . HTN (hypertension) [I10] 09/12/2011  . Aspiration pneumonia [J69.0] 09/11/2011  . RUQ abdominal pain [R10.11] 09/11/2011  . SOB (shortness of breath) [R06.02] 09/11/2011  . Pneumonia [J18.9] 09/04/2011  . Fever [R50.9] 09/04/2011  . Acute pancreatitis [K85.9] 09/03/2011  . Abdominal pain [789.0] 09/03/2011  . Acute encephalopathy [G93.40] 08/28/2011  . Hypernatremia [E87.0] 08/27/2011  . Seizure [R56.9] 08/23/2011  . Poisoning by anticholinergic drug [T44.3X1A] 08/23/2011  . Eczema [L30.9] 08/23/2011  . Papular rash, generalized [R21] 08/23/2011  . HTN (hypertension), malignant [I10] 08/23/2011  . Hyperglycemia [R73.9] 08/23/2011  . PTSD (post-traumatic stress disorder) [F43.10] 08/23/2011  . Cardiac arrest [I46.9] 08/23/2011  . QT prolongation [I45.81] 08/23/2011    Total Time spent with patient: 25 minutes  Subjective:   Ross [name] is a 49 y.o. male patient admitted with Psychosis, Schizophrenia, R/O, Chronic PTSD. Pt reportedly naked and smearing feces all over the walls in the jail. Pt appears to be responding to internal stimuli with paranoia when assessed today. Continues to meet inpatient criteria.   HPI:  AA male was evaluated this morning for bizarre behavior at the jail where he was yesterday.  Patient was brought in from [place] where he was for for DUI under IVC.  Patient was trying to flush his clothing in the toilet at the jail.  This morning during our assessment patient was finding it difficult to put his words together.  He admitted to a diagnosis of PTSD after his [place] war service.  He also admitted to a  diagnosis of anxiety and drinking problem.  He reports drinking a fifth a day.  He also uses Marijuana occasionally according to him.  He reported that he has not taken his MH medications for 10 days because he was in jail.  He did not know the names of his medications.  He reports receving care at the [place] in [place] and that he was hospitalized at the [facility] 30 days ago.  He could not remember why he was hospitalized.  Collateral information from his mother states that Patient has been found in the house naked.  He has been non compliant with his medications.  He denied SI/HI/AVH and has been accepted for admission.   Please see Collateral information from his sister this afternoon.  Seen today on rounds still confused.  Patient was not able to explain how he ended up in [employer] [facility] [place] where he receives care from the [place] [facility].  Patient stated that his family does not want want him to continue getting treatment at the [place] because [facility] is not able to assist him with housing, transportation and his other needs. He could not explain his trip to [place] instead of traveling from [place] to [place] to see his father.  Patient is still up for admission and is waiting for bed.  He denies SI/HI/AVH.  Patient is moved back to TCU for testing positive for C-Difficile infection.  Psychiatry will continue to follow patient until we secure an admission bed.   Seen this morning alert and oriented x3.  Patient stated that he plans to remain on his medications and that he will  be seeing his Psychiatrist at the [facility].  Patient denied SI/HI/AVH.  Patient will be discharged from Psychiatry and will be managed by the EDP for his C-Difficile.  HPI Elements:   Location:  Psychosis, Schizophrenia Spectrum disorder.. Quality:  severe. Severity:  severe. Timing:  Acute. Duration:  Chronic Mental illness.. Context:  IVC for bizarre behavior and intoxication.  Past Medical History:  Past Medical  History  Diagnosis Date  . Sleep apnea     had surgery to correct it  . Mental disorder     PTSD  . Seizures     currently dx with seizures  . Cardiac arrest 08/23/2011  . Pneumonia 09/04/2011  . Pancreatitis   . SOB (shortness of breath) 09/11/2011  . Hypertension   . Anxiety     Past Surgical History  Procedure Laterality Date  . Tonsillectomy    . Uvulopalatopharyngoplasty, tonsillectomy and septoplasty  06/16/2000  . Left heart catheterization with coronary angiogram N/A 08/24/2011    Procedure: LEFT HEART CATHETERIZATION WITH CORONARY ANGIOGRAM;  Surgeon: Corky Crafts, MD;  Location: [facility] C[facility] LAB;  Service: Cardiovascular;  Laterality: N/A;   Family History:  Family History  Problem Relation Age of Onset  . Heart attack Mother    Social History:  History  Alcohol Use  . Yes    Comment: occ     History  Drug Use No    History   Social History  . Marital Status: Married    Spouse Name: N/A  . Number of Children: N/A  . Years of Education: N/A   Social History Main Topics  . Smoking status: Former Smoker    Types: Cigarettes  . Smokeless tobacco: Never Used  . Alcohol Use: Yes     Comment: occ  . Drug Use: No  . Sexual Activity: Not Currently   Other Topics Concern  . None   Social History Narrative   Additional Social History:    Pain Medications: See PTA List Prescriptions: See PTA List Over the Counter: See PTA List History of alcohol / drug use?: No history of alcohol / drug abuse   Allergies:   Allergies  Allergen Reactions  . Benadryl [Diphenhydramine Hcl]     anaphylaxis  . Antihistamines, Diphenhydramine-Type     Rash/eczema     Labs:  Results for orders placed or performed during the hospital encounter of 03/27/15 (from the past 48 hour(s))  CBG monitoring, ED     Status: Abnormal   Collection Time: 03/31/15  7:19 AM  Result Value Ref Range   Glucose-Capillary 114 (H) 65 - 99 mg/dL   Comment 1 Notify RN    Comment 2  Document in Chart     Vitals: Blood pressure 112/61, pulse 66, temperature 98.8 F (37.1 C), temperature source Oral, resp. rate 18, SpO2 100 %.  Risk to Self: Suicidal Ideation: No Suicidal Intent: No Is patient at risk for suicide?: No Suicidal Plan?: No Access to Means: No What has been your use of drugs/alcohol within the last 12 months?: Social drinking only, per pt How many times?: 0 Other Self Harm Risks: None known Triggers for Past Attempts: Unknown Intentional Self Injurious Behavior: None Risk to Others: Homicidal Ideation: No Thoughts of Harm to Others: No Current Homicidal Intent: No Current Homicidal Plan: No Access to Homicidal Means: No Identified Victim: n/a History of harm to others?: Yes Assessment of Violence: None Noted Violent Behavior Description: Pt calm and cooperative; No known hx of violence Does  patient have access to weapons?: No Criminal Charges Pending?: No Does patient have a court date: No Prior Inpatient Therapy: Prior Inpatient Therapy: Yes Prior Therapy Dates: Unknown, but within the last couple of years Prior Therapy Facilty/Provider(s): [facility] [place] Reason for Treatment: PTSD Prior Outpatient Therapy: Prior Outpatient Therapy: Yes Prior Therapy Dates: Ongoing Prior Therapy Facilty/Provider(s): [facility] [place] Reason for Treatment: PTSD Does patient have an ACCT team?: No Does patient have Intensive In-House Services?  : No Does patient have [facility] services? : No Does patient have P4CC services?: No  No current facility-administered medications for this encounter.   Current Outpatient Prescriptions  Medication Sig Dispense Refill  . acetaminophen (TYLENOL) 325 MG tablet Take 650 mg by mouth every 6 (six) hours as needed for mild pain.    . cetaphil (CETAPHIL) cream Apply 1 application topically daily as needed (dry skin). Apply after bathing    . fluticasone (FLONASE) 50 MCG/ACT nasal spray Place 1 spray into both nostrils at bedtime.    .  folic acid (FOLVITE) 1 MG tablet Take 1 mg by mouth daily.    . Melatonin 3 MG TABS Take 1 tablet by mouth at bedtime.    . metFORMIN (GLUCOPHAGE) 500 MG tablet Take 500 mg by mouth daily with breakfast.    . Multiple Vitamins-Minerals (MULTIVITAMIN ADULT PO) Take 1 tablet by mouth daily.    . naltrexone (DEPADE) 50 MG tablet Take 50 mg by mouth daily.    Marland Kitchen OLANZapine (ZYPREXA) 10 MG tablet Take 10 mg by mouth at bedtime.    . Omega-3 Fatty Acids (FISH OIL PO) Take 1 tablet by mouth daily.    Marland Kitchen thiamine 100 MG tablet Take 100 mg by mouth daily.    . metroNIDAZOLE (FLAGYL) 500 MG tablet Take 1 tablet (500 mg total) by mouth 3 (three) times daily. 42 tablet 0    Musculoskeletal: Strength & Muscle Tone: within normal limits Gait & Station: normal Patient leans: N/A  Psychiatric Specialty Exam: Physical Exam  HENT:  Right Ear: External ear normal.    Review of Systems  Constitutional: Negative.   HENT: Negative.   Eyes: Negative.   Respiratory: Negative.   Cardiovascular: Negative.   Gastrointestinal: Negative.   Genitourinary: Negative.   Musculoskeletal: Negative.   Skin: Negative.   Neurological: Negative.   Endo/Heme/Allergies: Negative.   Psychiatric/Behavioral: Positive for depression and hallucinations (pt appears to be responding to internal stimuli). The patient is nervous/anxious and has insomnia.   All other systems reviewed and are negative.   Blood pressure 112/61, pulse 66, temperature 98.8 F (37.1 C), temperature source Oral, resp. rate 18, SpO2 100 %.There is no weight on file to calculate BMI.  General Appearance: Casual  Eye Contact::  Minimal  Speech:  Clear and Coherent and Normal Rate  Volume:  Normal  Mood:  Depressed  Affect:  Congruent  Thought Process:  Coherent, Goal Directed and Intact    Orientation:  Full (Time, Place, and Person)  Thought Content:  WDL  Suicidal Thoughts:  No  Homicidal Thoughts:  No  Memory:  Immediate;   Good Recent;    Good Remote;   Fair  Judgement:  Fair  Insight:  Fair and Shallow  Psychomotor Activity:  Normal  Concentration:  Fair  Recall:  Poor  Fund of Knowledge:Fair  Language: Good  Akathisia:  NA  Handed:  Right  AIMS (if indicated):     Assets:  Desire for Improvement  ADL's:  Intact  Cognition: WNL  Sleep:  Medical Decision Making: Review of Psycho-Social Stressors (1), Established Problem, Worsening (2), Review of Medication Regimen & Side Effects (2) and Review of New Medication or Change in Dosage (2)  *I have reviewed plan below and concur with changes on 03/30/15 as follows:  Treatment Plan Summary: Daily contact with patient to assess and evaluate symptoms and progress in treatment and Medication management  Plan:  Continue all medications as prescribed with no changes made today.   Disposition: Psychiatry has determined that patient is cleared from this services.  Patient will be given prescription for his medications and will continue to see his providers at the [facility] in [place].  Earney Navy, [facility] P [facility] 03/31/2015 5:03 PM   ADDENDUM NOTE:  Patient left after his discharge by the EDP without receiving his MH Prescriptions.  Mother, Jacquise Halpin stated that patient was on his way back to to [place].  Mother was informed that patient did not receive his prescriptions before leaving and that he need to go to the [place] as soon as tomorrow to get his medications.  Patient's mother verbalized understanding and stated that she will get in touch with her son as soon as possible.  Dahlia Byes   PMHNP-BC Patient seen face-to-face for psychiatric evaluation, chart reviewed and case discussed with the physician extender and developed treatment plan. Reviewed the information documented and agree with the treatment plan. Thedore Mins, MD

## 2015-03-31 NOTE — ED Provider Notes (Signed)
Pt cleared by psyc to go home.  Will tx c-dif with flagyl and follow up   Bethann Berkshire, MD 03/31/15 1114

## 2015-03-31 NOTE — ED Notes (Signed)
Spoke to oncoming RN whom reports pt was cleared by [employer]

## 2015-03-31 NOTE — ED Notes (Addendum)
Pt stated he got a DUI about 10 days ago in [place]. He was transported to [facility] and put in jail. Pt is very pleasant but at times stands looking at the wall asd if he sees something. Pt denies Si or HI. 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 Pt did take a shower . He was given a pitcher of water and instructed to push fluids. Pt stated he has less stomach cramps and a lesser amount of diarrhea too. Pt does contract for safety. Pts FSBS was 114 at 7am. 10:45a-pt states he has not had a BM today. Phoned pharmacy-pt needs to be on Flagyl 500mg  tid times 14 days. MD made aware. Pt is for discharge.Spoke to police to make sure pt is fre to leave TCU and does not have to go back to jail. Pt contracts for safety and denies Si and HI.

## 2015-03-31 NOTE — ED Notes (Signed)
Pt requested to take shower. He was given towel, body wash, and washcloth, change of clothes for a wash up at the sink. Pt notified that he may shower in the a.m.

## 2015-03-31 NOTE — Discharge Instructions (Signed)
Follow up with your family md or [facility] [phone] in 1-2 weeks

## 2015-03-31 NOTE — ED Notes (Signed)
Pt denied questions, concerns r/t dc. Pt ambulatory and a&ox4. pts mother will be coming to assist pt with transport to home

## 2015-08-21 ENCOUNTER — Emergency Department: Payer: Medicare Other

## 2015-08-21 ENCOUNTER — Inpatient Hospital Stay: Payer: Medicare Other

## 2015-08-21 ENCOUNTER — Inpatient Hospital Stay
Admission: EM | Admit: 2015-08-21 | Discharge: 2015-09-14 | DRG: 870 | Disposition: A | Discharge status: Other Acute Inpatient Hospital | Payer: Medicare Other | Attending: Internal Medicine | Admitting: Internal Medicine

## 2015-08-21 ENCOUNTER — Inpatient Hospital Stay
Admit: 2015-08-21 | Discharge: 2015-08-21 | Disposition: A | Discharge status: Home / Self Care | Payer: Medicare Other | Attending: Internal Medicine | Admitting: Internal Medicine

## 2015-08-21 DIAGNOSIS — E870 Hyperosmolality and hypernatremia: Secondary | ICD-10-CM | POA: Diagnosis present

## 2015-08-21 DIAGNOSIS — A047 Enterocolitis due to Clostridium difficile: Secondary | ICD-10-CM | POA: Diagnosis present

## 2015-08-21 DIAGNOSIS — J9600 Acute respiratory failure, unspecified whether with hypoxia or hypercapnia: Secondary | ICD-10-CM | POA: Diagnosis not present

## 2015-08-21 DIAGNOSIS — R6521 Severe sepsis with septic shock: Secondary | ICD-10-CM | POA: Diagnosis not present

## 2015-08-21 DIAGNOSIS — I2510 Atherosclerotic heart disease of native coronary artery without angina pectoris: Secondary | ICD-10-CM | POA: Diagnosis present

## 2015-08-21 DIAGNOSIS — R400 Somnolence: Secondary | ICD-10-CM | POA: Diagnosis not present

## 2015-08-21 DIAGNOSIS — E876 Hypokalemia: Secondary | ICD-10-CM | POA: Diagnosis not present

## 2015-08-21 DIAGNOSIS — J158 Pneumonia due to other specified bacteria: Secondary | ICD-10-CM | POA: Diagnosis not present

## 2015-08-21 DIAGNOSIS — R4182 Altered mental status, unspecified: Secondary | ICD-10-CM

## 2015-08-21 DIAGNOSIS — R740 Nonspecific elevation of levels of transaminase and lactic acid dehydrogenase [LDH]: Secondary | ICD-10-CM | POA: Diagnosis present

## 2015-08-21 DIAGNOSIS — F1010 Alcohol abuse, uncomplicated: Secondary | ICD-10-CM | POA: Insufficient documentation

## 2015-08-21 DIAGNOSIS — R410 Disorientation, unspecified: Secondary | ICD-10-CM | POA: Diagnosis not present

## 2015-08-21 DIAGNOSIS — N179 Acute kidney failure, unspecified: Secondary | ICD-10-CM

## 2015-08-21 DIAGNOSIS — Z8674 Personal history of sudden cardiac arrest: Secondary | ICD-10-CM

## 2015-08-21 DIAGNOSIS — I252 Old myocardial infarction: Secondary | ICD-10-CM | POA: Diagnosis not present

## 2015-08-21 DIAGNOSIS — Y95 Nosocomial condition: Secondary | ICD-10-CM | POA: Diagnosis present

## 2015-08-21 DIAGNOSIS — F1011 Alcohol abuse, in remission: Secondary | ICD-10-CM

## 2015-08-21 DIAGNOSIS — Z87891 Personal history of nicotine dependence: Secondary | ICD-10-CM | POA: Diagnosis not present

## 2015-08-21 DIAGNOSIS — F209 Schizophrenia, unspecified: Secondary | ICD-10-CM | POA: Diagnosis not present

## 2015-08-21 DIAGNOSIS — T796XXS Traumatic ischemia of muscle, sequela: Secondary | ICD-10-CM | POA: Diagnosis not present

## 2015-08-21 DIAGNOSIS — E860 Dehydration: Secondary | ICD-10-CM | POA: Diagnosis not present

## 2015-08-21 DIAGNOSIS — E872 Acidosis: Secondary | ICD-10-CM | POA: Diagnosis present

## 2015-08-21 DIAGNOSIS — T796XXD Traumatic ischemia of muscle, subsequent encounter: Secondary | ICD-10-CM | POA: Diagnosis not present

## 2015-08-21 DIAGNOSIS — Z4659 Encounter for fitting and adjustment of other gastrointestinal appliance and device: Secondary | ICD-10-CM

## 2015-08-21 DIAGNOSIS — E869 Volume depletion, unspecified: Secondary | ICD-10-CM | POA: Diagnosis present

## 2015-08-21 DIAGNOSIS — J918 Pleural effusion in other conditions classified elsewhere: Secondary | ICD-10-CM | POA: Diagnosis not present

## 2015-08-21 DIAGNOSIS — J90 Pleural effusion, not elsewhere classified: Secondary | ICD-10-CM | POA: Insufficient documentation

## 2015-08-21 DIAGNOSIS — A419 Sepsis, unspecified organism: Secondary | ICD-10-CM | POA: Diagnosis present

## 2015-08-21 DIAGNOSIS — Z452 Encounter for adjustment and management of vascular access device: Secondary | ICD-10-CM

## 2015-08-21 DIAGNOSIS — R1319 Other dysphagia: Secondary | ICD-10-CM | POA: Diagnosis not present

## 2015-08-21 DIAGNOSIS — R0602 Shortness of breath: Secondary | ICD-10-CM

## 2015-08-21 DIAGNOSIS — D649 Anemia, unspecified: Secondary | ICD-10-CM | POA: Diagnosis not present

## 2015-08-21 DIAGNOSIS — B9689 Other specified bacterial agents as the cause of diseases classified elsewhere: Secondary | ICD-10-CM | POA: Diagnosis not present

## 2015-08-21 DIAGNOSIS — N170 Acute kidney failure with tubular necrosis: Secondary | ICD-10-CM | POA: Diagnosis not present

## 2015-08-21 DIAGNOSIS — R079 Chest pain, unspecified: Secondary | ICD-10-CM

## 2015-08-21 DIAGNOSIS — R32 Unspecified urinary incontinence: Secondary | ICD-10-CM | POA: Diagnosis not present

## 2015-08-21 DIAGNOSIS — G9340 Encephalopathy, unspecified: Secondary | ICD-10-CM | POA: Diagnosis not present

## 2015-08-21 DIAGNOSIS — F200 Paranoid schizophrenia: Secondary | ICD-10-CM | POA: Diagnosis not present

## 2015-08-21 DIAGNOSIS — M6282 Rhabdomyolysis: Secondary | ICD-10-CM | POA: Diagnosis present

## 2015-08-21 DIAGNOSIS — R569 Unspecified convulsions: Secondary | ICD-10-CM | POA: Diagnosis present

## 2015-08-21 DIAGNOSIS — M7989 Other specified soft tissue disorders: Secondary | ICD-10-CM

## 2015-08-21 DIAGNOSIS — Z978 Presence of other specified devices: Secondary | ICD-10-CM

## 2015-08-21 DIAGNOSIS — R0600 Dyspnea, unspecified: Secondary | ICD-10-CM

## 2015-08-21 DIAGNOSIS — Z9689 Presence of other specified functional implants: Secondary | ICD-10-CM

## 2015-08-21 DIAGNOSIS — T796XXA Traumatic ischemia of muscle, initial encounter: Secondary | ICD-10-CM | POA: Diagnosis not present

## 2015-08-21 DIAGNOSIS — J9601 Acute respiratory failure with hypoxia: Secondary | ICD-10-CM | POA: Diagnosis present

## 2015-08-21 DIAGNOSIS — I10 Essential (primary) hypertension: Secondary | ICD-10-CM | POA: Diagnosis not present

## 2015-08-21 DIAGNOSIS — I214 Non-ST elevation (NSTEMI) myocardial infarction: Secondary | ICD-10-CM

## 2015-08-21 DIAGNOSIS — J810 Acute pulmonary edema: Secondary | ICD-10-CM | POA: Diagnosis not present

## 2015-08-21 DIAGNOSIS — Z87898 Personal history of other specified conditions: Secondary | ICD-10-CM

## 2015-08-21 DIAGNOSIS — Z01818 Encounter for other preprocedural examination: Secondary | ICD-10-CM

## 2015-08-21 DIAGNOSIS — R7989 Other specified abnormal findings of blood chemistry: Secondary | ICD-10-CM | POA: Diagnosis present

## 2015-08-21 DIAGNOSIS — R945 Abnormal results of liver function studies: Secondary | ICD-10-CM

## 2015-08-21 DIAGNOSIS — J9690 Respiratory failure, unspecified, unspecified whether with hypoxia or hypercapnia: Secondary | ICD-10-CM

## 2015-08-21 DIAGNOSIS — A0472 Enterocolitis due to Clostridium difficile, not specified as recurrent: Secondary | ICD-10-CM

## 2015-08-21 DIAGNOSIS — D696 Thrombocytopenia, unspecified: Secondary | ICD-10-CM | POA: Diagnosis not present

## 2015-08-21 DIAGNOSIS — J189 Pneumonia, unspecified organism: Secondary | ICD-10-CM | POA: Diagnosis not present

## 2015-08-21 HISTORY — DX: Schizophrenia, unspecified: F20.9

## 2015-08-21 HISTORY — DX: Acute kidney failure, unspecified: N17.9

## 2015-08-21 HISTORY — DX: Acute myocardial infarction, unspecified: I21.9

## 2015-08-21 HISTORY — DX: Rhabdomyolysis: M62.82

## 2015-08-21 LAB — COMPREHENSIVE METABOLIC PANEL
ALT: 598 U/L — AB (ref 17–63)
Albumin: 4.1 g/dL (ref 3.5–5.0)
Alkaline Phosphatase: 92 U/L (ref 38–126)
Anion gap: 22 — ABNORMAL HIGH (ref 5–15)
BILIRUBIN TOTAL: 2 mg/dL — AB (ref 0.3–1.2)
BUN: 38 mg/dL — AB (ref 6–20)
CO2: 19 mmol/L — ABNORMAL LOW (ref 22–32)
CREATININE: 2.91 mg/dL — AB (ref 0.61–1.24)
Calcium: 9.1 mg/dL (ref 8.9–10.3)
Chloride: 104 mmol/L (ref 101–111)
GFR calc Af Amer: 28 mL/min — ABNORMAL LOW (ref >60)
GFR, EST NON AFRICAN AMERICAN: 24 mL/min — AB (ref >60)
Glucose, Bld: 90 mg/dL (ref 65–99)
Potassium: 3.7 mmol/L (ref 3.5–5.1)
Sodium: 145 mmol/L (ref 135–145)
TOTAL PROTEIN: 7.9 g/dL (ref 6.5–8.1)

## 2015-08-21 LAB — CBC WITH DIFFERENTIAL/PLATELET
Basophils Absolute: 0 10*3/uL (ref 0–0.1)
Eosinophils Absolute: 0 10*3/uL (ref 0–0.7)
Eosinophils Relative: 0 %
HEMATOCRIT: 47.2 % (ref 40.0–52.0)
HEMOGLOBIN: 15.9 g/dL (ref 13.0–18.0)
LYMPHS ABS: 0.6 10*3/uL — AB (ref 1.0–3.6)
Lymphocytes Relative: 11 %
MCH: 30.6 pg (ref 26.0–34.0)
MCHC: 33.6 g/dL (ref 32.0–36.0)
MCV: 90.9 fL (ref 80.0–100.0)
Monocytes Absolute: 0.4 10*3/uL (ref 0.2–1.0)
Neutro Abs: 4.7 10*3/uL (ref 1.4–6.5)
Neutrophils Relative %: 82 %
Platelets: DECREASED 10*3/uL (ref 150–440)
RBC: 5.19 MIL/uL (ref 4.40–5.90)
RDW: 14.4 % (ref 11.5–14.5)
WBC: 5.8 10*3/uL (ref 3.8–10.6)

## 2015-08-21 LAB — LIPASE, BLOOD: LIPASE: 18 U/L (ref 11–51)

## 2015-08-21 LAB — URINALYSIS COMPLETE WITH MICROSCOPIC (ARMC ONLY)
Bilirubin Urine: NEGATIVE
Glucose, UA: NEGATIVE mg/dL
Leukocytes, UA: NEGATIVE
Nitrite: NEGATIVE
PROTEIN: 100 mg/dL — AB
Specific Gravity, Urine: 1.024 (ref 1.005–1.030)
pH: 5 (ref 5.0–8.0)

## 2015-08-21 LAB — URINE DRUG SCREEN, QUALITATIVE (ARMC ONLY)
AMPHETAMINES, UR SCREEN: NOT DETECTED
Barbiturates, Ur Screen: NOT DETECTED
Benzodiazepine, Ur Scrn: POSITIVE — AB
CANNABINOID 50 NG, UR ~~LOC~~: POSITIVE — AB
COCAINE METABOLITE, UR ~~LOC~~: NOT DETECTED
MDMA (ECSTASY) UR SCREEN: NOT DETECTED
Methadone Scn, Ur: NOT DETECTED
Opiate, Ur Screen: NOT DETECTED
Phencyclidine (PCP) Ur S: NOT DETECTED
TRICYCLIC, UR SCREEN: NOT DETECTED

## 2015-08-21 LAB — BLOOD GAS, ARTERIAL
ALLENS TEST (PASS/FAIL): POSITIVE — AB
Acid-base deficit: 3.5 mmol/L — ABNORMAL HIGH (ref 0.0–2.0)
Acid-base deficit: 4.1 mmol/L — ABNORMAL HIGH (ref 0.0–2.0)
BICARBONATE: 18.2 meq/L — AB (ref 21.0–28.0)
Bicarbonate: 21.6 mEq/L (ref 21.0–28.0)
FIO2: 0.24
FIO2: 0.6
O2 SAT: 99 %
O2 Saturation: 90.7 %
PATIENT TEMPERATURE: 37
PATIENT TEMPERATURE: 37
PH ART: 7.47 — AB (ref 7.350–7.450)
PO2 ART: 142 mmHg — AB (ref 83.0–108.0)
pCO2 arterial: 25 mmHg — ABNORMAL LOW (ref 32.0–48.0)
pCO2 arterial: 41 mmHg (ref 32.0–48.0)
pH, Arterial: 7.33 — ABNORMAL LOW (ref 7.350–7.450)
pO2, Arterial: 56 mmHg — ABNORMAL LOW (ref 83.0–108.0)

## 2015-08-21 LAB — TROPONIN I
TROPONIN I: 1.73 ng/mL — AB (ref <0.031)
Troponin I: 1.56 ng/mL — ABNORMAL HIGH (ref <0.031)

## 2015-08-21 LAB — MAGNESIUM: MAGNESIUM: 2.7 mg/dL — AB (ref 1.7–2.4)

## 2015-08-21 LAB — HEPARIN LEVEL (UNFRACTIONATED)

## 2015-08-21 LAB — CK: Total CK: 38327 U/L — ABNORMAL HIGH (ref 49–397)

## 2015-08-21 LAB — ETHANOL

## 2015-08-21 LAB — LACTIC ACID, PLASMA
LACTIC ACID, VENOUS: 2.3 mmol/L — AB (ref 0.5–2.0)
Lactic Acid, Venous: 3.2 mmol/L (ref 0.5–2.0)

## 2015-08-21 LAB — GLUCOSE, CAPILLARY
GLUCOSE-CAPILLARY: 91 mg/dL (ref 65–99)
Glucose-Capillary: 107 mg/dL — ABNORMAL HIGH (ref 65–99)
Glucose-Capillary: 188 mg/dL — ABNORMAL HIGH (ref 65–99)

## 2015-08-21 LAB — APTT: APTT: 28 s (ref 24–36)

## 2015-08-21 LAB — PROTIME-INR
INR: 1.09
Prothrombin Time: 14.3 seconds (ref 11.4–15.0)

## 2015-08-21 MED ORDER — INSULIN ASPART 100 UNIT/ML ~~LOC~~ SOLN
0.0000 [IU] | Freq: Three times a day (TID) | SUBCUTANEOUS | Status: DC
  Administered 2015-08-22 – 2015-08-27 (×4): 1 [IU] via SUBCUTANEOUS
  Administered 2015-08-27 – 2015-08-28 (×4): 2 [IU] via SUBCUTANEOUS
  Administered 2015-08-29 – 2015-09-03 (×3): 1 [IU] via SUBCUTANEOUS
  Filled 2015-08-21 (×2): qty 1
  Filled 2015-08-21: qty 2
  Filled 2015-08-21 (×3): qty 1
  Filled 2015-08-21 (×2): qty 2
  Filled 2015-08-21 (×3): qty 1

## 2015-08-21 MED ORDER — INSULIN ASPART 100 UNIT/ML ~~LOC~~ SOLN
0.0000 [IU] | Freq: Every day | SUBCUTANEOUS | Status: DC
Start: 2015-08-21 — End: 2015-09-07
  Administered 2015-08-29: 1 [IU] via SUBCUTANEOUS

## 2015-08-21 MED ORDER — CHLORHEXIDINE GLUCONATE 0.12% ORAL RINSE (MEDLINE KIT)
15.0000 mL | Freq: Two times a day (BID) | OROMUCOSAL | Status: DC
  Administered 2015-08-21 – 2015-08-28 (×13): 15 mL via OROMUCOSAL
  Filled 2015-08-21 (×12): qty 15

## 2015-08-21 MED ORDER — PANTOPRAZOLE SODIUM 40 MG IV SOLR
40.0000 mg | INTRAVENOUS | Status: DC
  Administered 2015-08-21 – 2015-08-23 (×3): 40 mg via INTRAVENOUS
  Filled 2015-08-21 (×3): qty 40

## 2015-08-21 MED ORDER — VANCOMYCIN HCL IN DEXTROSE 1-5 GM/200ML-% IV SOLN
1000.0000 mg | Freq: Once | INTRAVENOUS | Status: AC
  Administered 2015-08-21: 1000 mg via INTRAVENOUS
  Filled 2015-08-21 (×2): qty 200

## 2015-08-21 MED ORDER — SODIUM CHLORIDE 0.9 % IV SOLN
1750.0000 mg | Freq: Once | INTRAVENOUS | Status: DC
  Filled 2015-08-21: qty 35

## 2015-08-21 MED ORDER — ASPIRIN 300 MG RE SUPP
300.0000 mg | Freq: Once | RECTAL | Status: AC
  Administered 2015-08-21: 300 mg via RECTAL
  Filled 2015-08-21: qty 1

## 2015-08-21 MED ORDER — FENTANYL 2500MCG IN NS 250ML (10MCG/ML) PREMIX INFUSION
100.0000 ug/h | INTRAVENOUS | Status: DC
  Administered 2015-08-21: 25 ug/h via INTRAVENOUS
  Administered 2015-08-22: 250 ug/h via INTRAVENOUS
  Administered 2015-08-22 – 2015-08-23 (×3): 200 ug/h via INTRAVENOUS
  Administered 2015-08-23: 300 ug/h via INTRAVENOUS
  Administered 2015-08-24: 400 ug/h via INTRAVENOUS
  Administered 2015-08-24: 100 ug/h via INTRAVENOUS
  Administered 2015-08-25: 400 ug/h via INTRAVENOUS
  Administered 2015-08-25: 300 ug/h via INTRAVENOUS
  Administered 2015-08-25 – 2015-08-26 (×2): 400 ug/h via INTRAVENOUS
  Filled 2015-08-21 (×12): qty 250

## 2015-08-21 MED ORDER — MIDAZOLAM HCL 5 MG/ML IJ SOLN
1.0000 mg/h | INTRAMUSCULAR | Status: DC
  Administered 2015-08-21: 0.5 mg/h via INTRAVENOUS
  Administered 2015-08-22: 4 mg/h via INTRAVENOUS
  Administered 2015-08-22: 2.5 mg/h via INTRAVENOUS
  Administered 2015-08-23: 4 mg/h via INTRAVENOUS
  Administered 2015-08-24: 1 mg/h via INTRAVENOUS
  Administered 2015-08-24: 3 mg/h via INTRAVENOUS
  Administered 2015-08-25: 6 mg/h via INTRAVENOUS
  Filled 2015-08-21 (×8): qty 10

## 2015-08-21 MED ORDER — SODIUM CHLORIDE 0.9 % IV BOLUS (SEPSIS)
1000.0000 mL | INTRAVENOUS | Status: AC
  Administered 2015-08-21 (×3): 1000 mL via INTRAVENOUS
  Filled 2015-08-21: qty 1000

## 2015-08-21 MED ORDER — PIPERACILLIN-TAZOBACTAM 3.375 G IVPB 30 MIN
3.3750 g | Freq: Once | INTRAVENOUS | Status: AC
  Administered 2015-08-21: 3.375 g via INTRAVENOUS
  Filled 2015-08-21: qty 50

## 2015-08-21 MED ORDER — DEXTROSE 5 % IV SOLN
Freq: Once | INTRAVENOUS | Status: AC
  Administered 2015-08-21: 16:00:00 via INTRAVENOUS

## 2015-08-21 MED ORDER — MIDAZOLAM HCL 2 MG/2ML IJ SOLN
4.0000 mg | Freq: Once | INTRAMUSCULAR | Status: AC
  Administered 2015-08-21: 4 mg via INTRAVENOUS

## 2015-08-21 MED ORDER — ACETAMINOPHEN 650 MG RE SUPP
650.0000 mg | Freq: Once | RECTAL | Status: AC
  Administered 2015-08-21: 650 mg via RECTAL
  Filled 2015-08-21: qty 1

## 2015-08-21 MED ORDER — ROCURONIUM BROMIDE 50 MG/5ML IV SOLN
100.0000 mg | Freq: Once | INTRAVENOUS | Status: AC
  Administered 2015-08-21: 100 mg via INTRAVENOUS

## 2015-08-21 MED ORDER — ACETAMINOPHEN 325 MG PO TABS
650.0000 mg | ORAL_TABLET | Freq: Four times a day (QID) | ORAL | Status: DC | PRN
  Administered 2015-08-22 – 2015-08-23 (×5): 650 mg via ORAL
  Filled 2015-08-21 (×5): qty 2

## 2015-08-21 MED ORDER — ETOMIDATE 2 MG/ML IV SOLN
20.0000 mg | Freq: Once | INTRAVENOUS | Status: AC
  Administered 2015-08-21: 20 mg via INTRAVENOUS
  Filled 2015-08-21: qty 10

## 2015-08-21 MED ORDER — ACETAMINOPHEN 650 MG RE SUPP: 650.0000 mg | Freq: Four times a day (QID) | RECTAL | Status: DC | PRN

## 2015-08-21 MED ORDER — VANCOMYCIN HCL IN DEXTROSE 1-5 GM/200ML-% IV SOLN
1000.0000 mg | INTRAVENOUS | Status: DC
  Filled 2015-08-21: qty 200

## 2015-08-21 MED ORDER — HEPARIN SODIUM (PORCINE) 5000 UNIT/ML IJ SOLN: 5000.0000 [IU] | Freq: Three times a day (TID) | INTRAMUSCULAR | Status: DC

## 2015-08-21 MED ORDER — SODIUM CHLORIDE 0.9 % IV SOLN
1750.0000 mg | Freq: Once | INTRAVENOUS | Status: AC
  Administered 2015-08-21: 1750 mg via INTRAVENOUS
  Filled 2015-08-21: qty 35

## 2015-08-21 MED ORDER — HEPARIN (PORCINE) IN NACL 100-0.45 UNIT/ML-% IJ SOLN
1350.0000 [IU]/h | INTRAMUSCULAR | Status: DC
  Administered 2015-08-21: 1150 [IU]/h via INTRAVENOUS
  Filled 2015-08-21 (×2): qty 250

## 2015-08-21 MED ORDER — ANTISEPTIC ORAL RINSE SOLUTION (CORINZ)
7.0000 mL | Freq: Four times a day (QID) | OROMUCOSAL | Status: DC
  Administered 2015-08-21 – 2015-08-29 (×27): 7 mL via OROMUCOSAL
  Filled 2015-08-21 (×18): qty 7

## 2015-08-21 MED ORDER — PIPERACILLIN-TAZOBACTAM 3.375 G IVPB
3.3750 g | Freq: Three times a day (TID) | INTRAVENOUS | Status: DC
  Administered 2015-08-21 – 2015-08-23 (×5): 3.375 g via INTRAVENOUS
  Filled 2015-08-21 (×7): qty 50

## 2015-08-21 MED ORDER — MIDAZOLAM HCL 2 MG/2ML IJ SOLN
2.0000 mg | Freq: Once | INTRAMUSCULAR | Status: AC
Start: 2015-08-21 — End: 2015-08-21
  Administered 2015-08-21: 2 mg via INTRAVENOUS

## 2015-08-21 MED ORDER — SODIUM CHLORIDE 0.9 % IV SOLN
20.0000 mg/kg | INTRAVENOUS | Status: DC
  Filled 2015-08-21: qty 39.6

## 2015-08-21 MED ORDER — VANCOMYCIN HCL IN DEXTROSE 1-5 GM/200ML-% IV SOLN
1000.0000 mg | Freq: Once | INTRAVENOUS | Status: AC
  Administered 2015-08-21: 1000 mg via INTRAVENOUS
  Filled 2015-08-21: qty 200

## 2015-08-21 NOTE — ED Notes (Signed)
Pt with movement of bilateral arms; MD to bedside, verbal orders for 2mg  IV bolus of versed given.

## 2015-08-21 NOTE — ED Provider Notes (Addendum)
[facility] Emergency Department Provider Note  ____________________________________________  Time seen: Approximately 12:49 PM  I have reviewed the triage vital signs and the nursing notes.   HISTORY  Chief Complaint  Patient is lethargic and minimally responsive in the setting of reported seizures  HPI Ross [name] is a 49 y.o. male brought in by EMS when he was found unresponsive by his apartment complex maintenance man.  Reportedly his mother could not get in contact with him were about 3 days and she contacted the apartment complex for someone to check on him.  EMS was called and they witnessed what they described as seizure-like activity.  However, in spite of the activity, he was able to look around and state his name, which is inconsistent with epileptiform seizures.  The patient was given Versed 2 mg IV as well as Valium 6 mg IV.  The patient reportedly does have a seizure history, but no other specifics are available.  The patient is lethargic upon arrival but can state his own name.  He is toxic appearing, minimally responsive and appears extremely dehydrated based on his mucous membranes and dry appearance of his eyes.  Additionally, he is febrile at nearly 103.   Past Medical History  Diagnosis Date  . Sleep apnea     had surgery to correct it  . Mental disorder     PTSD  . Seizures (HCC)     currently dx with seizures  . Cardiac arrest (HCC) 08/23/2011  . Pneumonia 09/04/2011  . Pancreatitis   . SOB (shortness of breath) 09/11/2011  . Hypertension   . Anxiety   . Myocardial infarction (HCC)   . Schizophrenia [facility])     Patient Active Problem List   Diagnosis Date Noted  . Sepsis (HCC) 08/21/2015  . Psychoses   . Psychosis 03/28/2015  . HTN (hypertension) 09/12/2011  . Aspiration pneumonia (HCC) 09/11/2011  . RUQ abdominal pain 09/11/2011  . SOB (shortness of breath) 09/11/2011  . Pneumonia 09/04/2011  . Fever 09/04/2011  .  Acute pancreatitis 09/03/2011  . Abdominal pain 09/03/2011  . Acute encephalopathy 08/28/2011  . Hypernatremia 08/27/2011  . Seizure (HCC) 08/23/2011  . Poisoning by anticholinergic drug 08/23/2011  . Eczema 08/23/2011  . Papular rash, generalized 08/23/2011  . HTN (hypertension), malignant 08/23/2011  . Hyperglycemia 08/23/2011  . PTSD (post-traumatic stress disorder) 08/23/2011  . Cardiac arrest (HCC) 08/23/2011  . QT prolongation 08/23/2011    Past Surgical History  Procedure Laterality Date  . Tonsillectomy    . Uvulopalatopharyngoplasty, tonsillectomy and septoplasty  06/16/2000  . Left heart catheterization with coronary angiogram N/A 08/24/2011    Procedure: LEFT HEART CATHETERIZATION WITH CORONARY ANGIOGRAM;  Surgeon: Corky Crafts, MD;  Location: [facility] CATH LAB;  Service: Cardiovascular;  Laterality: N/A;    Current Outpatient Rx  Name  Route  Sig  Dispense  Refill  . acetaminophen (TYLENOL) 325 MG tablet   Oral   Take 650 mg by mouth every 4 (four) hours as needed for mild pain or fever.          . fluticasone (FLONASE) 50 MCG/ACT nasal spray   Each Nare   Place 1-2 sprays into both nostrils at bedtime.          . folic acid (FOLVITE) 1 MG tablet   Oral   Take 1 mg by mouth daily.         . Melatonin 3 MG TABS   Oral   Take 3 mg  by mouth at bedtime.          . metFORMIN (GLUCOPHAGE) 500 MG tablet   Oral   Take 500 mg by mouth daily.          . Multiple Vitamin (MULTIVITAMIN WITH MINERALS) TABS tablet   Oral   Take 1 tablet by mouth daily.         . naltrexone (DEPADE) 50 MG tablet   Oral   Take 50 mg by mouth daily.         Marland Kitchen. thiamine 100 MG tablet   Oral   Take 100 mg by mouth daily.         Marland Kitchen. triamcinolone cream (KENALOG) 0.1 %   Topical   Apply 1 application topically 2 (two) times daily as needed (for rash).         . metroNIDAZOLE (FLAGYL) 500 MG tablet   Oral   Take 1 tablet (500 mg total) by mouth 3 (three) times  daily. Patient not taking: Reported on 08/21/2015   42 tablet   0     Allergies Antihistamines, diphenhydramine-type; Benadryl; and Hydroxyzine  Family History  Problem Relation Age of Onset  . Heart attack Mother     Social History Social History  Substance Use Topics  . Smoking status: Former Smoker    Types: Cigarettes  . Smokeless tobacco: Never Used  . Alcohol Use: Yes     Comment: heavy, has a "drinking problem" per mother    Review of Systems Unable to obtain due to critical illness and altered mental status  ____________________________________________   PHYSICAL EXAM:  VITAL SIGNS: ED Triage Vitals  Enc Vitals Group     BP 08/21/15 1240 119/72 mmHg     Pulse Rate 08/21/15 1240 123     Resp 08/21/15 1240 38     Temp 08/21/15 1240 97.4 F (36.3 C)     Temp Source 08/21/15 1240 Oral     SpO2 08/21/15 1240 90 %     Weight 08/21/15 1240 218 lb 5 oz (99.026 kg)     Height 08/21/15 1240 5\' 11"  (1.803 m)     Head Cir --      Peak Flow --      Pain Score --      Pain Loc --      Pain Edu? --      Excl. in GC? --     Constitutional: Toxic appearance, smells of urinary incontinence, states his name, denies pain, but nothing else. Eyes: Conjunctivae are severely injected. Dry corneas w/ probable abrasions bilaterally Head: Atraumatic but with "puffy" lips. Nose: No congestion/rhinnorhea. Mouth/Throat: Mucous membranes are severely dry.  Oropharynx non-erythematous. Neck: No stridor.  No cervical spine tenderness to palpation. No meningismus (voluntarily moving his head side-to-side and flexing/extending) Cardiovascular: tachycardia, regular rhythm. Grossly normal heart sounds.  Good peripheral circulation. Respiratory: Normal respiratory effort.  No retractions. Lungs CTAB. Gastrointestinal: Soft and nontender. No distention. No abdominal bruits. No CVA tenderness. Musculoskeletal: No lower extremity tenderness nor edema.  No joint effusions. Neurologic:   Slurred speech, no gross neuro deficits Skin:  Skin is warm, dry and intact. No rash noted.   ____________________________________________   LABS (all labs ordered are listed, but only abnormal results are displayed)  Labs Reviewed  LACTIC ACID, PLASMA - Abnormal; Notable for the following:    Lactic Acid, Venous 2.3 (*)    All other components within normal limits  LACTIC ACID, PLASMA - Abnormal; Notable for the  following:    Lactic Acid, Venous 3.2 (*)    All other components within normal limits  COMPREHENSIVE METABOLIC PANEL - Abnormal; Notable for the following:    CO2 19 (*)    BUN 38 (*)    Creatinine, Ser 2.91 (*)    ALT 598 (*)    Total Bilirubin 2.0 (*)    GFR calc non Af Amer 24 (*)    GFR calc Af Amer 28 (*)    Anion gap 22 (*)    All other components within normal limits  TROPONIN I - Abnormal; Notable for the following:    Troponin I 1.56 (*)    All other components within normal limits  CBC WITH DIFFERENTIAL/PLATELET - Abnormal; Notable for the following:    Lymphs Abs 0.6 (*)    All other components within normal limits  BLOOD GAS, ARTERIAL - Abnormal; Notable for the following:    pH, Arterial 7.47 (*)    pCO2 arterial 25 (*)    pO2, Arterial 56 (*)    Bicarbonate 18.2 (*)    Acid-base deficit 3.5 (*)    Allens test (pass/fail) POSITIVE (*)    All other components within normal limits  MAGNESIUM - Abnormal; Notable for the following:    Magnesium 2.7 (*)    All other components within normal limits  URINALYSIS COMPLETEWITH MICROSCOPIC ([facility] ONLY) - Abnormal; Notable for the following:    Color, Urine AMBER (*)    APPearance CLOUDY (*)    Ketones, ur 1+ (*)    Hgb urine dipstick 3+ (*)    Protein, ur 100 (*)    Bacteria, UA RARE (*)    Squamous Epithelial / LPF 0-5 (*)    All other components within normal limits  URINE DRUG SCREEN, QUALITATIVE ([facility] ONLY) - Abnormal; Notable for the following:    Cannabinoid 50 Ng, Ur Noble POSITIVE (*)     Benzodiazepine, Ur Scrn POSITIVE (*)    All other components within normal limits  HEPARIN LEVEL (UNFRACTIONATED) - Abnormal; Notable for the following:    Heparin Unfractionated <0.10 (*)    All other components within normal limits  CULTURE, BLOOD (ROUTINE X 2)  CULTURE, BLOOD (ROUTINE X 2)  URINE CULTURE  C DIFFICILE QUICK SCREEN W PCR REFLEX  GLUCOSE, CAPILLARY  CK  LIPASE, BLOOD  APTT  PROTIME-INR  ETHANOL  INFLUENZA PANEL BY PCR (TYPE A & B, H1N1)  HIV ANTIBODY (ROUTINE TESTING)  HEPATITIS PANEL, ACUTE  TROPONIN I  TROPONIN I  TROPONIN I  COMPREHENSIVE METABOLIC PANEL  CBC  HEMOGLOBIN A1C  CBG MONITORING, ED  CBG MONITORING, ED   ____________________________________________  EKG  ED ECG REPORT I, Farhana Fellows, the attending physician, personally viewed and interpreted this ECG.   Date: 08/21/2015  EKG Time: 12:40  Rate: 122  Rhythm: sinus tachycardia  Axis: Normal  Intervals:QTC prolonged at 519, otherwise unremarkable  ST&T Change: Non-specific ST segment / T-wave changes, but no evidence of acute ischemia.  ____________________________________________  RADIOLOGY   Ct Head Wo Contrast  08/21/2015  CLINICAL DATA:  Refractory seizures EXAM: CT HEAD WITHOUT CONTRAST TECHNIQUE: Contiguous axial images were obtained from the base of the skull through the vertex without intravenous contrast. COMPARISON:  Head CT August 24, 2011; brain MRI August 26, 2011 FINDINGS: The ventricles are normal in size and configuration. There is no intracranial mass, hemorrhage, extra-axial fluid collection, or midline shift. By CT, the gray-white compartments appear normal. The increased T2 signal in the periventricular  white matter noted on prior MR is not appreciable as abnormality on this noncontrast enhanced CT. No acute infarct in particular is evident on this study. The bony calvarium appears intact. The mastoid air cells are clear. IMPRESSION: Study within normal limits.  Electronically Signed   By: Bretta Bang III M.D.   On: 08/21/2015 13:33   Dg Chest Portable 1 View  08/21/2015  CLINICAL DATA:  Status post intubation.  Seizure. EXAM: PORTABLE CHEST 1 VIEW COMPARISON:  Earlier today FINDINGS: Satisfactory position of the ET tube with tip above the carina. Heart size appears normal. No pleural effusion or edema. No airspace consolidation. IMPRESSION: 1. No complications after E ET tube placement. The tip is in satisfactory position above the carina. Electronically Signed   By: Signa Kell M.D.   On: 08/21/2015 15:20   Dg Chest Port 1 View  08/21/2015  CLINICAL DATA:  Unresponsive.  Seizure activity. EXAM: PORTABLE CHEST 1 VIEW COMPARISON:  09/11/2011 FINDINGS: The heart size is normal. There is no pleural effusion or edema. No airspace consolidation identified. The visualized bony structures appear intact. IMPRESSION: 1. No acute cardiopulmonary abnormalities. Electronically Signed   By: Signa Kell M.D.   On: 08/21/2015 13:51   Dg Abd Portable 1v  08/21/2015  CLINICAL DATA:  Bedside orogastric tube placement. Patient found unresponsive by a maintenance person at his apartment complex. Multiple witnessed seizures during the ambulance tried to the hospital. EXAM: PORTABLE ABDOMEN - 1 VIEW COMPARISON:  CT abdomen and pelvis 09/03/2011. FINDINGS: Orogastric tube tip barely in the fundus of the stomach and could be advanced. Nonobstructive bowel gas pattern. Gas within normal caliber colon from cecum to rectum. Gas within normal caliber small bowel loops throughout the abdomen. An opaque foreign body (ring) projects over the right upper pelvis. Foley catheter noted within the urinary bladder. Phleboliths low in both sides of the pelvis. Degenerative changes involving the lower lumbar spine. IMPRESSION: 1. Orogastric tube tip just into the fundus of the stomach. This could be further advanced. 2. No acute abdominal abnormality. 3. Opaque foreign body (ring) projects  over the right upper pelvis. If this is not in the patient's pocket, it may be in a loop of distal small bowel. Electronically Signed   By: Hulan Saas M.D.   On: 08/21/2015 16:04    ____________________________________________   PROCEDURES  Procedure(s) performed: intubation, see procedure note(s).   INTUBATION Performed by: Loleta Rose  Required items: required blood products, implants, devices, and special equipment available Patient identity confirmed: provided demographic data and hospital-assigned identification number Time out: Immediately prior to procedure a "time out" was called to verify the correct patient, procedure, equipment, support staff and site/side marked as required.  Indications: Airway protection and hypoxia in spite of supplemental O2; critical illness   Intubation method: Glidescope Laryngoscopy   Preoxygenation: Nonrebreather  Sedatives: Etomidate 20 mg IV Paralytic: Rocuronium 100 mg IV  Tube Size: 8.0 cuffed, placed at 26 cm at lips  Post-procedure assessment: chest rise and ETCO2 monitor Breath sounds: equal and absent over the epigastrium Tube secured with: ETT holder Chest x-ray interpreted by radiologist and me.  Chest x-ray findings: endotracheal tube in appropriate position  Patient tolerated the procedure well with no immediate complications.   Critical Care performed: Yes, see critical care note(s)   CRITICAL CARE Performed by: Loleta Rose   Total critical care time: 60 minutes  Critical care time was exclusive of separately billable procedures and treating other patients.  Critical care was  necessary to treat or prevent imminent or life-threatening deterioration.  Critical care was time spent personally by me on the following activities: development of treatment plan with patient and/or surrogate as well as nursing, discussions with consultants, evaluation of patient's response to treatment, examination of patient,  obtaining history from patient or surrogate, ordering and performing treatments and interventions, ordering and review of laboratory studies, ordering and review of radiographic studies, pulse oximetry and re-evaluation of patient's condition.  ____________________________________________   INITIAL IMPRESSION / ASSESSMENT AND PLAN / ED COURSE  Pertinent labs & imaging results that were available during my care of the patient were reviewed by me and considered in my medical decision making (see chart for details).  The patient is toxic appearing upon arrival with questionable seizures, fever, and appears extremely dehydrated.  I will treat him both as sepsis and refractory seizures with aggressive fluids, empiric antibiotics, and stat head CT.  I will reevaluate shortly.  He is awake and speaking to me upon arrival although he does appear toxic.  His mother came to the department shortly after arrival and provided additional history of the patient's alcohol abuse.  (Note that documentation was delayed due to multiple ED patients requiring immediate care.)   The patient had no acute findings on head CT.  He has evidence of multisystem organ failure.  His troponin is significantly elevated but in the absence of EKG changes suggesting acute ischemia, I suspect this may be further evidence of multisystem organ failure rather than an acute cardiac event.  Given his multiple comorbidities I am starting a heparin infusion but not giving a bolus.  He remained altered with questionable airway protection and hypoxia even on supplementary oxygen.  Given his critical illness, altered mental status/delirium, question airway protection, and acute respiratory failure with hypoxia, I intubated the patient successfully and without complication.  It is important to note that prior to intubation, the patient was not exhibiting any seizure-like activity in the ED.  He had several twitches and shakes that apparently were  what the EMS crew were observing earlier, but it is more likely to be rigors in the setting of his fever than actual seizures.  During these episodes he remained conscious and able to state his name.  I provided Dilantin to be on the safe side but I do not believe he is in status epilepticus.  He has purposeful movements of his hands, reaching out at his IVs, and pulling away from noxious stimuli.  He is being maintained on infusions of Versed and fentanyl.  I chose the Versed because of the additional history his mother provided; he reportedly is a heavy drinker and he likely has had seizures in the past according to her when he stops drinking.  He is hemodynamically stable on the infusions and appropriately sedated.  We submitted paperwork to the [facility] for their review so that I know whether to transfer him to the [place] or to admit him to our ICU for further management.  In addition to the treatment described above, he was given empiric broad-spectrum antibiotics, aspirin 300 mg rectal (for possible NSTEMI), and Tylenol 650 mg rectal for fever.  I am awaiting word back for the [place].  ED Sepsis - Repeat Assessment   Performed at:    08/21/2015 @ ~15:30  Last Vitals:    Blood pressure 134/85, pulse 113, temperature 101.9 F (38.8 C), temperature source Rectal, resp. rate 23, height  (1.803 m), weight 218 lb 5 oz (99.026  kg), SpO2 97 %.  Heart:      Good circulation, no M/R/G  Lungs:     Mechanically ventilated, CTAB  Capillary Refill:   normal  Peripheral Pulse (include location): Strong R radial   Skin (include color):   Normal/stable   ----------------------------------------- 4:10 PM on 08/21/2015 -----------------------------------------  Spoke with Dr. Elray Mcgregor in the [facility] ICU.  She has accepted the patient for transfer.  ----------------------------------------- 5:25 PM on 08/21/2015 -----------------------------------------  In the intervening time, the patient has  required adjustments to his sedation and is required minutes to keep him from extubating himself.  There is still no evidence that he is having seizures.  I called and spoke again with Dr. Elray Mcgregor regarding his status.  We agreed that he is too unstable for safe transport and that the risks incurred by transport, such as loss of airway, loss of IV access, delay in ICU care, etc., outweigh the benefits of moving him to the [place].  He will be admitted here for further ICU care.  His repeat lactate is coming down after fluid resuscitation. ____________________________________________  FINAL CLINICAL IMPRESSION(S) / ED DIAGNOSES  Final diagnoses:  Acute respiratory failure with hypoxia (HCC)  Altered mental status, unspecified altered mental status type  Sepsis, due to unspecified organism [facility])  Acute renal failure, unspecified acute renal failure type (HCC)  NSTEMI (non-ST elevated myocardial infarction) (HCC)  History of alcohol abuse  History of seizure      NEW MEDICATIONS STARTED DURING THIS VISIT:  New Prescriptions   No medications on file     Loleta Rose, MD 08/21/15 1845

## 2015-08-21 NOTE — Progress Notes (Signed)
eLink Physician-Brief Progress Note Patient Name: Ross [name] DOB: May 19, 1966 MRN: [id number]   Date of Service  08/21/2015  HPI/Events of Note  Spoke with bedside nurse to offer assistance from eLink.  eICU Interventions  Will order repeat BMP and ABG now.      Intervention Category Major Interventions: Respiratory failure - evaluation and management Intermediate Interventions: Electrolyte abnormality - evaluation and management  Sommer,Steven Eugene 08/21/2015, 7:33 PM

## 2015-08-21 NOTE — ED Notes (Signed)
Admitting MD at bedside.

## 2015-08-21 NOTE — ED Notes (Signed)
According to EMS, pt was found unresponsive by his [employer] Maintenance man. EMS witnessed multiple seizures in route.Pt was given 2mg  versed 6mg  valium by EMS.   Pt has hx seizure.  Pt arrives to ED post-ictal, responding to name.

## 2015-08-21 NOTE — Progress Notes (Signed)
eLink Physician-Brief Progress Note Patient Name: Ross [name] DOB: 1966-02-22 MRN: [id number]   Date of Service  08/21/2015  HPI/Events of Note  Intubated and ventilated. Needs stress ulcer prophylaxis.   eICU Interventions  Will order:  1. Protonix 40 mg IV now and Q day.     Intervention Category Intermediate Interventions: Best-practice therapies (e.g. DVT, beta blocker, etc.)  Sommer,Steven Eugene 08/21/2015, 8:08 PM

## 2015-08-21 NOTE — ED Notes (Signed)
Carlene CoriaShirly Gill (Mom) [phone]  Vance GatherShelley (unofficial caregiver) [phone]

## 2015-08-21 NOTE — H&P (Signed)
[facility] - [place] at [facility]   PATIENT NAME: Ross [name]    MR#:  [id number]  DATE OF BIRTH:  March 18, 1966  DATE OF ADMISSION:  08/21/2015  PRIMARY CARE PHYSICIAN: No primary care provider on file.   REQUESTING/REFERRING PHYSICIAN: Dr York Cerise  CHIEF COMPLAINT:   Chief Complaint  Patient presents with  . Seizures    HISTORY OF PRESENT ILLNESS:  Ross [name]  is a 49 y.o. male with a known history of alcohol abuse, schizophrenia, seizure disorder, coronary artery disease status post MI in 2012 resents by EMS after being found unresponsive in his apartment. He has had no contact with anyone for at least 24 hours. EMS reported seizure-like activity on arrival and during transport. On arrival to the emergency room he was able to state his name and follow simple Commands. He was very lethargic and responding minimally. He was hypoxic with oxygen saturations of 88% on 6 L. He was intubated in the emergency room by Dr. York Cerise due to respiratory failure with hypoxia. On presentation his clothes were soaked with urine and loose feces. He is being admitted for sepsis with high fever of 103, lactate of 2.3, multiorgan failure.  PAST MEDICAL HISTORY:   Past Medical History  Diagnosis Date  . Sleep apnea     had surgery to correct it  . Mental disorder     PTSD  . Seizures (HCC)     currently dx with seizures  . Cardiac arrest (HCC) 08/23/2011  . Pneumonia 09/04/2011  . Pancreatitis   . SOB (shortness of breath) 09/11/2011  . Hypertension   . Anxiety   . Myocardial infarction (HCC)   . Schizophrenia (HCC)     PAST SURGICAL HISTORY:   Past Surgical History  Procedure Laterality Date  . Tonsillectomy    . Uvulopalatopharyngoplasty, tonsillectomy and septoplasty  06/16/2000  . Left heart catheterization with coronary angiogram N/A 08/24/2011    Procedure: LEFT HEART CATHETERIZATION WITH CORONARY ANGIOGRAM;  Surgeon: Corky Crafts, MD;   Location: [facility] CATH LAB;  Service: Cardiovascular;  Laterality: N/A;    SOCIAL HISTORY:   Social History  Substance Use Topics  . Smoking status: Former Smoker    Types: Cigarettes  . Smokeless tobacco: Never Used  . Alcohol Use: Yes     Comment: heavy, has a "drinking problem" per mother    FAMILY HISTORY:   Family History  Problem Relation Age of Onset  . Heart attack Mother     DRUG ALLERGIES:   Allergies  Allergen Reactions  . Antihistamines, Diphenhydramine-Type Anaphylaxis and Rash  . Benadryl [Diphenhydramine Hcl] Anaphylaxis and Rash  . Hydroxyzine Anaphylaxis and Rash    REVIEW OF SYSTEMS:   ROS unable to obtain due to altered mental status  MEDICATIONS AT HOME:   Prior to Admission medications   Medication Sig Start Date End Date Taking? Authorizing Provider  acetaminophen (TYLENOL) 325 MG tablet Take 650 mg by mouth every 4 (four) hours as needed for mild pain or fever.    Yes Historical Provider, MD  fluticasone (FLONASE) 50 MCG/ACT nasal spray Place 1-2 sprays into both nostrils at bedtime.    Yes Historical Provider, MD  folic acid (FOLVITE) 1 MG tablet Take 1 mg by mouth daily.   Yes Historical Provider, MD  Melatonin 3 MG TABS Take 3 mg by mouth at bedtime.    Yes Historical Provider, MD  metFORMIN (GLUCOPHAGE) 500 MG tablet Take 500 mg by mouth daily.  Yes Historical Provider, MD  Multiple Vitamin (MULTIVITAMIN WITH MINERALS) TABS tablet Take 1 tablet by mouth daily.   Yes Historical Provider, MD  naltrexone (DEPADE) 50 MG tablet Take 50 mg by mouth daily.   Yes Historical Provider, MD  thiamine 100 MG tablet Take 100 mg by mouth daily.   Yes Historical Provider, MD  triamcinolone cream (KENALOG) 0.1 % Apply 1 application topically 2 (two) times daily as needed (for rash).   Yes Historical Provider, MD  metroNIDAZOLE (FLAGYL) 500 MG tablet Take 1 tablet (500 mg total) by mouth 3 (three) times daily. Patient not taking: Reported on 08/21/2015 03/31/15    Bethann Berkshire, MD      VITAL SIGNS:  Blood pressure 134/85, pulse 113, temperature 101.9 F (38.8 C), temperature source Rectal, resp. rate 23, height  (1.803 m), weight 99.026 kg (218 lb 5 oz), SpO2 97 %.  PHYSICAL EXAMINATION:  GENERAL:  49 y.o.-year-old patient lying in the bed, intubated, critically L EYES: Pupils equal, round, reactive to light and accommodation. There is significant conjunctival inflammation  HEENT: Head atraumatic, normocephalic. Oropharynx and nasopharynx clear.  NECK:  Supple, no jugular venous distention. No thyroid enlargement, no tenderness.  LUNGS: Normal breath sounds bilaterally, no wheezing, rales,rhonchi or crepitation. No use of accessory muscles of respiration. Appropriate cough response CARDIOVASCULAR: S1, S2 normal. No murmurs, rubs, or gallops.  ABDOMEN: Soft, nontender, nondistended. Bowel sounds present. No organomegaly or mass.  EXTREMITIES: No pedal edema, cyanosis, or clubbing. Referral pulses 2+ NEUROLOGIC: Patient is sedated for ventilation  PSYCHIATRIC: Sedated.  SKIN: No obvious rash, lesion, or ulcer.   LABORATORY PANEL:   CBC  Recent Labs Lab 08/21/15 1247  WBC 5.8  HGB 15.9  HCT 47.2  PLT PLATELET CLUMPS NOTED ON SMEAR, COUNT APPEARS DECREASED   ------------------------------------------------------------------------------------------------------------------  Chemistries   Recent Labs Lab 08/21/15 1247  NA 145  K 3.7  CL 104  CO2 19*  GLUCOSE 90  BUN 38*  CREATININE 2.91*  CALCIUM 9.1  MG 2.7*  ALT 598*  ALKPHOS 92  BILITOT 2.0*   ------------------------------------------------------------------------------------------------------------------  Cardiac Enzymes  Recent Labs Lab 08/21/15 1247  TROPONINI 1.56*   ------------------------------------------------------------------------------------------------------------------  RADIOLOGY:  Ct Head Wo Contrast  08/21/2015  CLINICAL DATA:  Refractory  seizures EXAM: CT HEAD WITHOUT CONTRAST TECHNIQUE: Contiguous axial images were obtained from the base of the skull through the vertex without intravenous contrast. COMPARISON:  Head CT August 24, 2011; brain MRI August 26, 2011 FINDINGS: The ventricles are normal in size and configuration. There is no intracranial mass, hemorrhage, extra-axial fluid collection, or midline shift. By CT, the gray-white compartments appear normal. The increased T2 signal in the periventricular white matter noted on prior MR is not appreciable as abnormality on this noncontrast enhanced CT. No acute infarct in particular is evident on this study. The bony calvarium appears intact. The mastoid air cells are clear. IMPRESSION: Study within normal limits. Electronically Signed   By: Bretta Bang III M.D.   On: 08/21/2015 13:33   Dg Chest Portable 1 View  08/21/2015  CLINICAL DATA:  Status post intubation.  Seizure. EXAM: PORTABLE CHEST 1 VIEW COMPARISON:  Earlier today FINDINGS: Satisfactory position of the ET tube with tip above the carina. Heart size appears normal. No pleural effusion or edema. No airspace consolidation. IMPRESSION: 1. No complications after E ET tube placement. The tip is in satisfactory position above the carina. Electronically Signed   By: Signa Kell M.D.   On: 08/21/2015 15:20  Dg Chest Port 1 View  08/21/2015  CLINICAL DATA:  Unresponsive.  Seizure activity. EXAM: PORTABLE CHEST 1 VIEW COMPARISON:  09/11/2011 FINDINGS: The heart size is normal. There is no pleural effusion or edema. No airspace consolidation identified. The visualized bony structures appear intact. IMPRESSION: 1. No acute cardiopulmonary abnormalities. Electronically Signed   By: Signa Kell M.D.   On: 08/21/2015 13:51   Dg Abd Portable 1v  08/21/2015  CLINICAL DATA:  Bedside orogastric tube placement. Patient found unresponsive by a maintenance person at his apartment complex. Multiple witnessed seizures during the  ambulance tried to the hospital. EXAM: PORTABLE ABDOMEN - 1 VIEW COMPARISON:  CT abdomen and pelvis 09/03/2011. FINDINGS: Orogastric tube tip barely in the fundus of the stomach and could be advanced. Nonobstructive bowel gas pattern. Gas within normal caliber colon from cecum to rectum. Gas within normal caliber small bowel loops throughout the abdomen. An opaque foreign body (ring) projects over the right upper pelvis. Foley catheter noted within the urinary bladder. Phleboliths low in both sides of the pelvis. Degenerative changes involving the lower lumbar spine. IMPRESSION: 1. Orogastric tube tip just into the fundus of the stomach. This could be further advanced. 2. No acute abdominal abnormality. 3. Opaque foreign body (ring) projects over the right upper pelvis. If this is not in the patient's pocket, it may be in a loop of distal small bowel. Electronically Signed   By: Hulan Saas M.D.   On: 08/21/2015 16:04    EKG:   Orders placed or performed during the hospital encounter of 08/21/15  . EKG 12-Lead  . EKG 12-Lead    IMPRESSION AND PLAN:   #1 sepsis: Source unknown at this time. Chest x-ray is clear. Urine with 6-30 white blood cells. Blood and urine cultures are pending. We will check influenza swab. Check C. difficile as he does have a history of positive C. difficile as recently as June/2016. Check HIV and acute hepatitis panel. Continue vancomycin and Zosyn  #2 altered mental status: Patient was found unresponsive. Reported seizure-like behavior from EMS however emergency room physician feels this was more likely rigors as the patient was able to follow commands and answer simple questions on arrival. He has a history of heavy alcohol use and may be in withdrawal. Urine drug screen positive for benzodiazepines and cannabinoids. Possible metabolic encephalopathy from infection. Also has a long psychiatric history each would complicate this evaluation. He is currently sedated for  intubation, will reassess in a.m. with SBT. No seizure-like activity at this time. Neurology consultation.  #3 acute respiratory failure with hypoxia: Chest x-ray is clear of pneumonia or edema. Unable to obtain CT angiography at this time due to renal failure, he has been started on full dose heparin. Will consult pulmonology for ventilator management and critical care assistance.  #4 NSTEMI: Monitor on telemetry. Continue to cycle troponins. Cardiology consultation. He has history of coronary artery disease and prior MI. Obtain 2-D echocardiogram. heparin drip initiated in the emergency room, will continue.  #5 acute renal failure: Likely due to sepsis. Continue fluid replacement. Monitor renal function. Avoid nephrotoxins. If does not improve significantly overnight would obtain renal ultrasound and consult nephrology. Electrolytes are stable  #6 acute hepatitis: Obtain hepatitis panel. Possibly due to alcohol abuse though ethanol level today is negative. Possibly shock liver in the setting of sepsis. Obtain abdominal ultrasound  All the records are reviewed and case discussed with ED provider and with e-link physician. Attempt to contact the patient's family was  unsuccessful.  CODE STATUS: full  TOTAL Critical Care TIME TAKING CARE OF THIS PATIENT: 45 minutes.  Greater than 50% of time spent in coordination of care and counseling.  Elby ShowersWALSH, CATHERINE M.D on 08/21/2015 at 5:54 PM  Between 7am to 6pm - Pager - [phone]  After 6pm go to [email or web address] - password [email or web address] [facility]  [facility]  Office  [phone]  CC: Primary care physician; No primary care provider on file.

## 2015-08-21 NOTE — Care Management Note (Signed)
Case Management Note  Patient Details  Name: Ross SpillersJames L Slingerland MRN: [id number] Date of Birth: [birth year]  Subjective/Objective:  Patient arrived post ictal today. Is now being intubated. Has full [facility] primary per patient when I checked with him on arrival.  Paperwork being completed by Misty StanleyLisa, [employer] cabin crewunit clerk.  She will fax it once complete.                  Action/Plan:   Expected Discharge Date:                  Expected Discharge Plan:     In-House Referral:     Discharge planning Services     Post Acute Care Choice:    Choice offered to:     DME Arranged:    DME Agency:     HH Arranged:    HH Agency:     Status of Service:     Medicare Important Message Given:    Date Medicare IM Given:    Medicare IM give by:    Date Additional Medicare IM Given:    Additional Medicare Important Message give by:     If discussed at Long Length of Stay Meetings, dates discussed:    Additional Comments:  Berna BueCheryl Teniola Tseng, RN 08/21/2015, 2:35 PM

## 2015-08-21 NOTE — Progress Notes (Signed)
*  PRELIMINARY RESULTS* °Echocardiogram °2D Echocardiogram has been performed. ° °Ross [name] °08/21/2015, 8:55 PM °

## 2015-08-21 NOTE — ED Notes (Signed)
Ross [name] (Mom) [phone]

## 2015-08-21 NOTE — Progress Notes (Signed)
ANTIBIOTIC CONSULT NOTE - INITIAL  Pharmacy Consult for VANCOMYCIN/ZOSYN Indication: Sepsis  Allergies  Allergen Reactions  . Benadryl [Diphenhydramine Hcl]     anaphylaxis  . Antihistamines, Diphenhydramine-Type     Rash/eczema     Patient Measurements: Height: 5\' 11"  (180.3 cm) Weight: 218 lb 5 oz (99.026 kg) IBW/kg (Calculated) : 75.3 Adjusted Body Weight: 84.8 kg  Vital Signs: Temp: 103.2 F (39.6 C) (11/04 1440) Temp Source: Rectal (11/04 1440) BP: 134/91 mmHg (11/04 1440) Pulse Rate: 112 (11/04 1440) Intake/Output from previous day:   Intake/Output from this shift:    Labs:  Recent Labs  08/21/15 1247  WBC 5.8  HGB 15.9  PLT PLATELET CLUMPS NOTED ON SMEAR, COUNT APPEARS DECREASED  CREATININE 2.91*   Estimated Creatinine Clearance: 37.6 mL/min (by C-G formula based on Cr of 2.91).   Microbiology: No results found for this or any previous visit (from the past 720 hour(s)).  Medical History: Past Medical History  Diagnosis Date  . Sleep apnea     had surgery to correct it  . Mental disorder     PTSD  . Seizures (HCC)     currently dx with seizures  . Cardiac arrest (HCC) 08/23/2011  . Pneumonia 09/04/2011  . Pancreatitis   . SOB (shortness of breath) 09/11/2011  . Hypertension   . Anxiety     Medications:  Scheduled:  . acetaminophen  650 mg Rectal Once  . aspirin  300 mg Rectal Once   Anti-infectives    Start     Dose/Rate Route Frequency Ordered Stop   08/22/15 2000  vancomycin (VANCOCIN) IVPB 1000 mg/200 mL premix     1,000 mg 200 mL/hr over 60 Minutes Intravenous Every 24 hours 08/21/15 1439     08/21/15 2200  piperacillin-tazobactam (ZOSYN) IVPB 3.375 g     3.375 g 12.5 mL/hr over 240 Minutes Intravenous 3 times per day 08/21/15 1431     08/21/15 2000  vancomycin (VANCOCIN) IVPB 1000 mg/200 mL premix     1,000 mg 200 mL/hr over 60 Minutes Intravenous  Once 08/21/15 1439     08/21/15 1300  piperacillin-tazobactam (ZOSYN) IVPB 3.375  g     3.375 g 100 mL/hr over 30 Minutes Intravenous  Once 08/21/15 1245 08/21/15 1332   08/21/15 1300  vancomycin (VANCOCIN) IVPB 1000 mg/200 mL premix     1,000 mg 200 mL/hr over 60 Minutes Intravenous  Once 08/21/15 1245 08/21/15 1402     Assessment: 49 yo male found unresponsive by his Apt. Complex Maintenance man. EMS witnessed multiple seizures. No MD/H&P note yet.  Starting Vancomycin/Zosyn for sepsis per order.  Goal of Therapy:  Vancomycin trough level 15-20 mcg/ml  Plan:  Measure antibiotic drug levels at steady state Follow up culture results  Patient received Zosyn 3.375gm IV x 1 in ER. Will continue with EI Zosyn 3.375gm IV q8h. Patient received Vancomycin 1 gram in ER x 1. Will give 2nd dose of Vancomycin 1 gram ~ 6 hrs later for stacked dosing. Will then continue with Vancomycin 1 gram IV Q24h. Will order Vancomycin trough prior to 4th dose on 11/6 at 1930.  Bari MantisKristin Torion Hulgan PharmD Clinical Pharmacist 08/21/2015 2:47 PM

## 2015-08-21 NOTE — Progress Notes (Signed)
ANTICOAGULATION CONSULT NOTE - Initial Consult  Pharmacy Consult for Heparin  Indication: chest pain/ACS  Allergies  Allergen Reactions  . Benadryl [Diphenhydramine Hcl] Anaphylaxis  . Antihistamines, Diphenhydramine-Type Rash    Patient Measurements: Height: 5\' 11"  (180.3 cm) Weight: 218 lb 5 oz (99.026 kg) IBW/kg (Calculated) : 75.3 Heparin Dosing Weight: 95 kg  Vital Signs: Temp: 102.8 F (39.3 C) (11/04 1515) Temp Source: Rectal (11/04 1440) BP: 132/82 mmHg (11/04 1515) Pulse Rate: 118 (11/04 1515)  Labs:  Recent Labs  08/21/15 1247  HGB 15.9  HCT 47.2  PLT PLATELET CLUMPS NOTED ON SMEAR, COUNT APPEARS DECREASED  APTT 28  LABPROT 14.3  INR 1.09  CREATININE 2.91*  TROPONINI 1.56*    Estimated Creatinine Clearance: 37.6 mL/min (by C-G formula based on Cr of 2.91).   Medical History: Past Medical History  Diagnosis Date  . Sleep apnea     had surgery to correct it  . Mental disorder     PTSD  . Seizures (HCC)     currently dx with seizures  . Cardiac arrest (HCC) 08/23/2011  . Pneumonia 09/04/2011  . Pancreatitis   . SOB (shortness of breath) 09/11/2011  . Hypertension   . Anxiety     Assessment: 49 yo male patient who was found unresponsive at his home. He is currently intubated with elevated troponins and positive urine drug screen for benzos and cannabinoid. Patient has had multiple seizures and per MD now with multisystem organ failure. Pharmacy consulted for heparin dosing for ACS.   Goal of Therapy:  Heparin level 0.3-0.7 units/ml Monitor platelets by anticoagulation protocol: Yes   Plan:  Will not bolus per MD request, due to patient having multisystem organ failure. Start heparin infusion at 1150 units/hr Check anti-Xa level in 6 hours and daily while on heparin Continue to monitor H&H and platelets    Cher NakaiSheema Lucynda Rosano, PharmD Pharmacy Resident

## 2015-08-22 DIAGNOSIS — J9600 Acute respiratory failure, unspecified whether with hypoxia or hypercapnia: Secondary | ICD-10-CM

## 2015-08-22 DIAGNOSIS — A419 Sepsis, unspecified organism: Principal | ICD-10-CM

## 2015-08-22 LAB — COMPREHENSIVE METABOLIC PANEL
ALK PHOS: 71 U/L (ref 38–126)
ALT: 1381 U/L — AB (ref 17–63)
Albumin: 3.4 g/dL — ABNORMAL LOW (ref 3.5–5.0)
Anion gap: 6 (ref 5–15)
BUN: 33 mg/dL — AB (ref 6–20)
CALCIUM: 7.2 mg/dL — AB (ref 8.9–10.3)
CHLORIDE: 113 mmol/L — AB (ref 101–111)
CO2: 24 mmol/L (ref 22–32)
CREATININE: 2.03 mg/dL — AB (ref 0.61–1.24)
GFR, EST AFRICAN AMERICAN: 43 mL/min — AB (ref >60)
GFR, EST NON AFRICAN AMERICAN: 37 mL/min — AB (ref >60)
Glucose, Bld: 137 mg/dL — ABNORMAL HIGH (ref 65–99)
Potassium: 3.5 mmol/L (ref 3.5–5.1)
SODIUM: 143 mmol/L (ref 135–145)
Total Bilirubin: 1.3 mg/dL — ABNORMAL HIGH (ref 0.3–1.2)
Total Protein: 6.5 g/dL (ref 6.5–8.1)

## 2015-08-22 LAB — BLOOD GAS, ARTERIAL
ACID-BASE DEFICIT: 2.2 mmol/L — AB (ref 0.0–2.0)
ALLENS TEST (PASS/FAIL): POSITIVE — AB
BICARBONATE: 23.7 meq/L (ref 21.0–28.0)
FIO2: 35
O2 SAT: 97.6 %
PATIENT TEMPERATURE: 37
PCO2 ART: 44 mmHg (ref 32.0–48.0)
PEEP: 5 cmH2O
PH ART: 7.34 — AB (ref 7.350–7.450)
PO2 ART: 103 mmHg (ref 83.0–108.0)
PRESSURE SUPPORT: 5 cmH2O

## 2015-08-22 LAB — URINALYSIS COMPLETE WITH MICROSCOPIC (ARMC ONLY)
BILIRUBIN URINE: NEGATIVE
Glucose, UA: 50 mg/dL — AB
Ketones, ur: NEGATIVE mg/dL
Leukocytes, UA: NEGATIVE
Nitrite: NEGATIVE
PH: 5 (ref 5.0–8.0)
PROTEIN: 100 mg/dL — AB
Specific Gravity, Urine: 1.024 (ref 1.005–1.030)

## 2015-08-22 LAB — INFLUENZA PANEL BY PCR (TYPE A & B)
H1N1 flu by pcr: NOT DETECTED
INFLAPCR: NEGATIVE
Influenza B By PCR: NEGATIVE

## 2015-08-22 LAB — CK: Total CK: 47318 U/L — ABNORMAL HIGH (ref 49–397)

## 2015-08-22 LAB — CBC
HCT: 44 % (ref 40.0–52.0)
Hemoglobin: 14.3 g/dL (ref 13.0–18.0)
MCH: 29.3 pg (ref 26.0–34.0)
MCHC: 32.5 g/dL (ref 32.0–36.0)
MCV: 90.4 fL (ref 80.0–100.0)
PLATELETS: 56 10*3/uL — AB (ref 150–440)
RBC: 4.87 MIL/uL (ref 4.40–5.90)
RDW: 14.2 % (ref 11.5–14.5)
WBC: 5.2 10*3/uL (ref 3.8–10.6)

## 2015-08-22 LAB — C DIFFICILE QUICK SCREEN W PCR REFLEX
C DIFFICLE (CDIFF) ANTIGEN: POSITIVE — AB
C Diff interpretation: POSITIVE
C Diff toxin: POSITIVE — AB

## 2015-08-22 LAB — LACTIC ACID, PLASMA
LACTIC ACID, VENOUS: 2.1 mmol/L — AB (ref 0.5–2.0)
Lactic Acid, Venous: 2.1 mmol/L (ref 0.5–2.0)
Lactic Acid, Venous: 2 mmol/L (ref 0.5–2.0)

## 2015-08-22 LAB — GLUCOSE, CAPILLARY
GLUCOSE-CAPILLARY: 134 mg/dL — AB (ref 65–99)
GLUCOSE-CAPILLARY: 83 mg/dL (ref 65–99)
Glucose-Capillary: 166 mg/dL — ABNORMAL HIGH (ref 65–99)
Glucose-Capillary: 116 mg/dL — ABNORMAL HIGH (ref 65–99)
Glucose-Capillary: 87 mg/dL (ref 65–99)

## 2015-08-22 LAB — MRSA PCR SCREENING: MRSA BY PCR: NEGATIVE

## 2015-08-22 LAB — HEPARIN LEVEL (UNFRACTIONATED)
HEPARIN UNFRACTIONATED: 0.37 [IU]/mL (ref 0.30–0.70)
Heparin Unfractionated: 0.27 IU/mL — ABNORMAL LOW (ref 0.30–0.70)

## 2015-08-22 LAB — TROPONIN I: TROPONIN I: 1.79 ng/mL — AB (ref <0.031)

## 2015-08-22 LAB — HEMOGLOBIN A1C: HEMOGLOBIN A1C: 5.2 % (ref 4.0–6.0)

## 2015-08-22 MED ORDER — VANCOMYCIN 50 MG/ML ORAL SOLUTION
125.0000 mg | Freq: Four times a day (QID) | ORAL | Status: DC
  Administered 2015-08-22: 125 mg via ORAL
  Filled 2015-08-22 (×4): qty 2.5

## 2015-08-22 MED ORDER — HEPARIN BOLUS VIA INFUSION
1400.0000 [IU] | Freq: Once | INTRAVENOUS | Status: AC
  Administered 2015-08-22: 1400 [IU] via INTRAVENOUS
  Filled 2015-08-22: qty 1400

## 2015-08-22 MED ORDER — VANCOMYCIN 50 MG/ML ORAL SOLUTION
500.0000 mg | Freq: Four times a day (QID) | ORAL | Status: DC
  Administered 2015-08-22 – 2015-08-24 (×7): 500 mg via ORAL
  Filled 2015-08-22 (×16): qty 10

## 2015-08-22 MED ORDER — SODIUM CHLORIDE 0.9 % IV SOLN
INTRAVENOUS | Status: DC
  Administered 2015-08-22 – 2015-08-28 (×16): via INTRAVENOUS

## 2015-08-22 MED ORDER — VANCOMYCIN HCL 10 G IV SOLR
1250.0000 mg | INTRAVENOUS | Status: DC
  Administered 2015-08-22: 1250 mg via INTRAVENOUS
  Filled 2015-08-22 (×3): qty 1250

## 2015-08-22 MED ORDER — IBUPROFEN 400 MG PO TABS
800.0000 mg | ORAL_TABLET | Freq: Two times a day (BID) | ORAL | Status: DC | PRN
  Administered 2015-08-22: 800 mg via ORAL
  Filled 2015-08-22: qty 2

## 2015-08-22 MED ORDER — STERILE WATER FOR INJECTION IV SOLN
Freq: Once | INTRAVENOUS | Status: AC
  Administered 2015-08-22: 12:00:00 via INTRAVENOUS
  Filled 2015-08-22: qty 850

## 2015-08-22 MED ORDER — VANCOMYCIN HCL IN DEXTROSE 1-5 GM/200ML-% IV SOLN
1000.0000 mg | INTRAVENOUS | Status: DC
  Filled 2015-08-22: qty 200

## 2015-08-22 NOTE — Consult Note (Addendum)
[facility]  Pulmonary Medicine Consultation      Name: Ross [name] MRN: [id number] DOB: 03-13-1966    ADMISSION DATE:  08/21/2015   CHIEF COMPLAINT:    acute resp failure   HISTORY OF PRESENT ILLNESS  49 yo AAM admitted to ICU for acute resp failure,  patient was hypoxic in ER and supposedly had seizure like activity with fevers and diarrhea.  Patient with recent h/o C diff colitis Patient was intubated, placed on full vent support. Currently on sedation, not on vasopressors    SIGNIFICANT EVENTS   11/4 intubated   PAST MEDICAL HISTORY    :  Past Medical History  Diagnosis Date  . Sleep apnea     had surgery to correct it  . Mental disorder     PTSD  . Seizures (HCC)     currently dx with seizures  . Cardiac arrest (HCC) 08/23/2011  . Pneumonia 09/04/2011  . Pancreatitis   . SOB (shortness of breath) 09/11/2011  . Hypertension   . Anxiety   . Myocardial infarction (HCC)   . Schizophrenia [facility](HCC)    Past Surgical History  Procedure Laterality Date  . Tonsillectomy    . Uvulopalatopharyngoplasty, tonsillectomy and septoplasty  06/16/2000  . Left heart catheterization with coronary angiogram N/A 08/24/2011    Procedure: LEFT HEART CATHETERIZATION WITH CORONARY ANGIOGRAM;  Surgeon: Corky CraftsJayadeep S Varanasi, MD;  [facility];  Service: Cardiovascular;  Laterality: N/A;   Prior to Admission medications   Medication Sig Start Date End Date Taking? Authorizing Provider  acetaminophen (TYLENOL) 325 MG tablet Take 650 mg by mouth every 4 (four) hours as needed for mild pain or fever.    Yes Historical Provider, MD  fluticasone (FLONASE) 50 MCG/ACT nasal spray Place 1-2 sprays into both nostrils at bedtime.    Yes Historical Provider, MD  folic acid (FOLVITE) 1 MG tablet Take 1 mg by mouth daily.   Yes Historical Provider, MD  Melatonin 3 MG TABS Take 3 mg by mouth at bedtime.    Yes Historical Provider, MD  metFORMIN (GLUCOPHAGE) 500 MG tablet Take 500 mg  by mouth daily.    Yes Historical Provider, MD  Multiple Vitamin (MULTIVITAMIN WITH MINERALS) TABS tablet Take 1 tablet by mouth daily.   Yes Historical Provider, MD  naltrexone (DEPADE) 50 MG tablet Take 50 mg by mouth daily.   Yes Historical Provider, MD  thiamine 100 MG tablet Take 100 mg by mouth daily.   Yes Historical Provider, MD  triamcinolone cream (KENALOG) 0.1 % Apply 1 application topically 2 (two) times daily as needed (for rash).   Yes Historical Provider, MD  metroNIDAZOLE (FLAGYL) 500 MG tablet Take 1 tablet (500 mg total) by mouth 3 (three) times daily. Patient not taking: Reported on 08/21/2015 03/31/15   Bethann BerkshireJoseph Zammit, MD   Allergies  Allergen Reactions  . Antihistamines, Diphenhydramine-Type Anaphylaxis and Rash  . Benadryl [Diphenhydramine Hcl] Anaphylaxis and Rash  . Hydroxyzine Anaphylaxis and Rash     FAMILY HISTORY   Family History  Problem Relation Age of Onset  . Heart attack Mother       SOCIAL HISTORY    reports that he has quit smoking. His smoking use included Cigarettes. He has never used smokeless tobacco. He reports that he drinks alcohol. He reports that he does not use illicit drugs.  Review of Systems  Unable to perform ROS: critical illness      VITAL SIGNS    Temp:  [97.4 F (36.3  C)-103.3 F (39.6 C)] 101.1 F (38.4 C) (11/05 0800) Pulse Rate:  [103-123] 106 (11/05 0800) Resp:  [14-38] 16 (11/05 0800) BP: (111-136)/(72-93) 123/92 mmHg (11/05 0800) SpO2:  [90 %-100 %] 98 % (11/05 0800) FiO2 (%):  [35 %-60 %] 35 % (11/05 0326) Weight:  [218 lb 5 oz (99.026 kg)] 218 lb 5 oz (99.026 kg) (11/04 1240) HEMODYNAMICS:   VENTILATOR SETTINGS: Vent Mode:  [-] PRVC FiO2 (%):  [35 %-60 %] 35 % Set Rate:  [16 bmp] 16 bmp Vt Set:  [550 mL] 550 mL PEEP:  [5 cmH20] 5 cmH20 INTAKE / OUTPUT:  Intake/Output Summary (Last 24 hours) at 08/22/15 0901 Last data filed at 08/22/15 0700  Gross per 24 hour  Intake    211 ml  Output    850 ml    Net   -639 ml       PHYSICAL EXAM   Physical Exam  Constitutional: No distress.  HENT:  Head: Normocephalic and atraumatic.  Eyes: Pupils are equal, round, and reactive to light. No scleral icterus.  Neck: Normal range of motion. Neck supple.  Cardiovascular: Normal rate and regular rhythm.   No murmur heard. Pulmonary/Chest: No respiratory distress. He has no wheezes. He has rales.  resp distress  Abdominal: Soft. He exhibits no distension. There is no tenderness.  Musculoskeletal: He exhibits no edema.  Neurological:  gcs<8T  Skin: Skin is warm. He is diaphoretic.       LABS   LABS:  CBC  Recent Labs Lab 08/21/15 1247 08/22/15 0241  WBC 5.8 5.2  HGB 15.9 14.3  HCT 47.2 44.0  PLT PLATELET CLUMPS NOTED ON SMEAR, COUNT APPEARS DECREASED 56*   Coag's  Recent Labs Lab 08/21/15 1247  APTT 28  INR 1.09   BMET  Recent Labs Lab 08/21/15 1247 08/22/15 0241  NA 145 143  K 3.7 3.5  CL 104 113*  CO2 19* 24  BUN 38* 33*  CREATININE 2.91* 2.03*  GLUCOSE 90 137*   Electrolytes  Recent Labs Lab 08/21/15 1247 08/22/15 0241  CALCIUM 9.1 7.2*  MG 2.7*  --    Sepsis Markers  Recent Labs Lab 08/22/15 0022 08/22/15 0240 08/22/15 0550  LATICACIDVEN 2.1* 2.1* 2.0   ABG  Recent Labs Lab 08/21/15 1255 08/21/15 2002  PHART 7.47* 7.33*  PCO2ART 25* 41  PO2ART 56* 142*   Liver Enzymes  Recent Labs Lab 08/21/15 1247 08/22/15 0241  AST >2275* >2275*  ALT 598* 1381*  ALKPHOS 92 71  BILITOT 2.0* 1.3*  ALBUMIN 4.1 3.4*   Cardiac Enzymes  Recent Labs Lab 08/21/15 1247 08/21/15 2031 08/22/15 0240  TROPONINI 1.56* 1.73* 1.79*   Glucose  Recent Labs Lab 08/21/15 1239 08/21/15 1533 08/21/15 1914 08/21/15 2304 08/22/15 0747  GLUCAP 91 107* 166* 188* 134*     Recent Results (from the past 240 hour(s))  Blood Culture (routine x 2)     Status: None (Preliminary result)   Collection Time: 08/21/15 12:47 PM  Result Value Ref  Range Status   Specimen Description BLOOD UNKNOWN  Final   Special Requests BOTTLES DRAWN AEROBIC AND ANAEROBIC  1CC  Final   Culture NO GROWTH < 24 HOURS  Final   Report Status PENDING  Incomplete  Blood Culture (routine x 2)     Status: None (Preliminary result)   Collection Time: 08/21/15 12:47 PM  Result Value Ref Range Status   Specimen Description BLOOD LEFT AC  Final   Special Requests BOTTLES DRAWN  AEROBIC AND ANAEROBIC  6CC  Final   Culture NO GROWTH < 24 HOURS  Final   Report Status PENDING  Incomplete  MRSA PCR Screening     Status: None   Collection Time: 08/21/15 11:47 PM  Result Value Ref Range Status   MRSA by PCR NEGATIVE NEGATIVE Final    Comment:        The GeneXpert MRSA Assay (FDA approved for NASAL specimens only), is one component of a comprehensive MRSA colonization surveillance program. It is not intended to diagnose MRSA infection nor to guide or monitor treatment for MRSA infections.      Current facility-administered medications:  .  0.9 %  sodium chloride infusion, , Intravenous, Continuous, Arnaldo Natal, MD, Last Rate: 100 mL/hr at 08/22/15 0700 .  acetaminophen (TYLENOL) tablet 650 mg, 650 mg, Oral, Q6H PRN, 650 mg at 08/22/15 0159 **OR** acetaminophen (TYLENOL) suppository 650 mg, 650 mg, Rectal, Q6H PRN, Gale Journey, MD .  antiseptic oral rinse solution (CORINZ), 7 mL, Mouth Rinse, QID, Gale Journey, MD, 7 mL at 08/22/15 0541 .  chlorhexidine gluconate (PERIDEX) 0.12 % solution 15 mL, 15 mL, Mouth Rinse, BID, Gale Journey, MD, 15 mL at 08/22/15 0809 .  fentaNYL in NS (75mcg/ml) infusion-PREMIX, 100 mcg/hr, Intravenous, Continuous, Loleta Rose, MD, Last Rate: 20 mL/hr at 08/22/15 0700, 200 mcg/hr at 08/22/15 0700 .  heparin ADULT infusion 100 units/mL (25000 units/250 mL), 1,350 Units/hr, Intravenous, Continuous, Loleta Rose, MD, Last Rate: 13.5 mL/hr at 08/22/15 0700, 1,350 Units/hr at 08/22/15 0700 .  ibuprofen  (ADVIL,MOTRIN) tablet 800 mg, 800 mg, Oral, BID PRN, Arnaldo Natal, MD, 800 mg at 08/22/15 0525 .  insulin aspart (novoLOG) injection 0-5 Units, 0-5 Units, Subcutaneous, QHS, Gale Journey, MD, 0 Units at 08/21/15 2318 .  insulin aspart (novoLOG) injection 0-9 Units, 0-9 Units, Subcutaneous, TID WC, Gale Journey, MD .  midazolam (VERSED) 50 mg in sodium chloride 0.9 % 50 mL (1 mg/mL) infusion, 1 mg/hr, Intravenous, Continuous, Loleta Rose, MD, Last Rate: 2.5 mL/hr at 08/22/15 0700, 2.5 mg/hr at 08/22/15 0700 .  pantoprazole (PROTONIX) injection 40 mg, 40 mg, Intravenous, Q24H, Karl Ito, MD, 40 mg at 08/21/15 2148 .  piperacillin-tazobactam (ZOSYN) IVPB 3.375 g, 3.375 g, Intravenous, 3 times per day, Loleta Rose, MD, 3.375 g at 08/22/15 0536 .  vancomycin (VANCOCIN) IVPB 1000 mg/200 mL premix, 1,000 mg, Intravenous, Q24H, Loleta Rose, MD  IMAGING    Ct Head Wo Contrast  08/21/2015  CLINICAL DATA:  Refractory seizures EXAM: CT HEAD WITHOUT CONTRAST TECHNIQUE: Contiguous axial images were obtained from the base of the skull through the vertex without intravenous contrast. COMPARISON:  Head CT August 24, 2011; brain MRI August 26, 2011 FINDINGS: The ventricles are normal in size and configuration. There is no intracranial mass, hemorrhage, extra-axial fluid collection, or midline shift. By CT, the gray-white compartments appear normal. The increased T2 signal in the periventricular white matter noted on prior MR is not appreciable as abnormality on this noncontrast enhanced CT. No acute infarct in particular is evident on this study. The bony calvarium appears intact. The mastoid air cells are clear. IMPRESSION: Study within normal limits. Electronically Signed   By: Bretta Bang III M.D.   On: 08/21/2015 13:33   Dg Chest Portable 1 View  08/21/2015  CLINICAL DATA:  Status post intubation.  Seizure. EXAM: PORTABLE CHEST 1 VIEW COMPARISON:  Earlier today FINDINGS: Satisfactory  position of the ET tube  with tip above the carina. Heart size appears normal. No pleural effusion or edema. No airspace consolidation. IMPRESSION: 1. No complications after E ET tube placement. The tip is in satisfactory position above the carina. Electronically Signed   By: Signa Kell M.D.   On: 08/21/2015 15:20   Dg Chest Port 1 View  08/21/2015  CLINICAL DATA:  Unresponsive.  Seizure activity. EXAM: PORTABLE CHEST 1 VIEW COMPARISON:  09/11/2011 FINDINGS: The heart size is normal. There is no pleural effusion or edema. No airspace consolidation identified. The visualized bony structures appear intact. IMPRESSION: 1. No acute cardiopulmonary abnormalities. Electronically Signed   By: Signa Kell M.D.   On: 08/21/2015 13:51   Dg Abd Portable 1v  08/21/2015  CLINICAL DATA:  Bedside orogastric tube placement. Patient found unresponsive by a maintenance person at his apartment complex. Multiple witnessed seizures during the ambulance tried to the hospital. EXAM: PORTABLE ABDOMEN - 1 VIEW COMPARISON:  CT abdomen and pelvis 09/03/2011. FINDINGS: Orogastric tube tip barely in the fundus of the stomach and could be advanced. Nonobstructive bowel gas pattern. Gas within normal caliber colon from cecum to rectum. Gas within normal caliber small bowel loops throughout the abdomen. An opaque foreign body (ring) projects over the right upper pelvis. Foley catheter noted within the urinary bladder. Phleboliths low in both sides of the pelvis. Degenerative changes involving the lower lumbar spine. IMPRESSION: 1. Orogastric tube tip just into the fundus of the stomach. This could be further advanced. 2. No acute abdominal abnormality. 3. Opaque foreign body (ring) projects over the right upper pelvis. If this is not in the patient's pocket, it may be in a loop of distal small bowel. Electronically Signed   By: Hulan Saas M.D.   On: 08/21/2015 16:04   US Abdomen Limited Ruq  08/21/2015  CLINICAL DATA:   Elevated liver function tests. EXAM: US ABDOMEN LIMITED - RIGHT UPPER QUADRANT COMPARISON:  09/03/2011 FINDINGS: Gallbladder: No gallstones or wall thickening visualized. No sonographic Murphy sign noted. Common bile duct: Diameter: 3.2 mm Liver: Liver shows increased echogenicity and coarsened echotexture. No mass or focal lesion. Hepatopetal flow documented in the portal vein. IMPRESSION: 1. No acute finding.  Normal gallbladder.  No bile duct dilation. 2. Increased echogenicity of the liver consistent with fatty infiltration. This is similar to the prior ultrasound. Electronically Signed   By: Amie Portland M.D.   On: 08/21/2015 21:59      Indwelling Urinary Catheter continued, requirement due to   Reason to continue Indwelling Urinary Catheter for strict Intake/Output monitoring for hemodynamic instability         Ventilator continued, requirement due to, resp failure    Ventilator Sedation RASS 0 to -2   MAJOR EVENTS/TEST RESULTS: 11/4 intubated   INDWELLING DEVICES::  MICRO DATA: MRSA PCR >>neg Urine  Blood Resp  FLU H1N1>> NEG  ANTIMICROBIALS:  Vacomycin/Zosyn >>11/4    ASSESSMENT/PLAN   49 yo AAM with acute resp failure with acute encephalopathy from acute sepsis probably from acute recurrent c diff colitis With acute renal failure from rhabdomyolysis with acute drug abuse with etoh abuse  PULMONARY -wean off sedation and assess for SAT/SBT today   CARDIOVASCULAR -blood pressure stable -not on vasopressors -IVF's  RENAL-Rhabdomyolysis Acute renal failure from ATN -follow UO, follow chem 7 -aggressive IVF's   GASTROINTESTINAL GI prophylaxis  HEMATOLOGIC Decreased PLT count -stop heparin infusion  INFECTIOUS R/o c diff -continue empiric abx  ENDOCRINE - ICU hypoglycemic\Hyperglycemia protocol   NEUROLOGIC - intubated  and sedated - minimal sedation to achieve a RASS goal: -1  After further assessment, patient with fevers, mental status  changes with renal failure and thrombocytopenia and possible seizures, will need to assess for TTP, however not anemic, may consider hem onc consult  I have personally obtained a history, examined the patient, evaluated laboratory and independently reviewed  imaging results, formulated the assessment and plan and placed orders.  The Patient requires high complexity decision making for assessment and support, frequent evaluation and titration of therapies, application of advanced monitoring technologies and extensive interpretation of multiple databases. Critical Care Time devoted to patient care services described in this note is 45 minutes.   Overall, patient is critically ill, prognosis is guarded. Patient at high risk for cardiac arrest and death.    Lucie Leather, M.D.  [facility]  Medical Director [facility] Medical Director [facility] Cardio-Pulmonary Department

## 2015-08-22 NOTE — Progress Notes (Signed)
ANTICOAGULATION CONSULT NOTE - Initial Consult  Pharmacy Consult for Heparin  Indication: chest pain/ACS  Allergies  Allergen Reactions  . Antihistamines, Diphenhydramine-Type Anaphylaxis and Rash  . Benadryl [Diphenhydramine Hcl] Anaphylaxis and Rash  . Hydroxyzine Anaphylaxis and Rash    Patient Measurements: Height: 5\' 11"  (180.3 cm) Weight: 218 lb 5 oz (99.026 kg) IBW/kg (Calculated) : 75.3 Heparin Dosing Weight: 95 kg  Vital Signs: Temp: 101.3 F (38.5 C) (11/05 0200) Temp Source: Other (Comment) (11/04 1900) BP: 126/92 mmHg (11/05 0200) Pulse Rate: 105 (11/05 0200)  Labs:  Recent Labs  08/21/15 1247 08/21/15 1615 08/21/15 2031 08/22/15 0022  HGB 15.9  --   --   --   HCT 47.2  --   --   --   PLT PLATELET CLUMPS NOTED ON SMEAR, COUNT APPEARS DECREASED  --   --   --   APTT 28  --   --   --   LABPROT 14.3  --   --   --   INR 1.09  --   --   --   HEPARINUNFRC  --  <0.10*  --  0.27*  CREATININE 2.91*  --   --   --   CKTOTAL [id number]*  --   --   --   TROPONINI 1.56*  --  1.73*  --     Estimated Creatinine Clearance: 37.6 mL/min (by C-G formula based on Cr of 2.91).   Medical History: Past Medical History  Diagnosis Date  . Sleep apnea     had surgery to correct it  . Mental disorder     PTSD  . Seizures (HCC)     currently dx with seizures  . Cardiac arrest (HCC) 08/23/2011  . Pneumonia 09/04/2011  . Pancreatitis   . SOB (shortness of breath) 09/11/2011  . Hypertension   . Anxiety   . Myocardial infarction (HCC)   . Schizophrenia [facility](HCC)     Assessment: 49 yo male patient who was found unresponsive at his home. He is currently intubated with elevated troponins and positive urine drug screen for benzos and cannabinoid. Patient has had multiple seizures and per MD now with multisystem organ failure. Pharmacy consulted for heparin dosing for ACS.   Goal of Therapy:  Heparin level 0.3-0.7 units/ml Monitor platelets by anticoagulation protocol: Yes    Plan:  Will not bolus per MD request, due to patient having multisystem organ failure. Start heparin infusion at 1150 units/hr Check anti-Xa level in 6 hours and daily while on heparin Continue to monitor H&H and platelets   11/5 00:30  heparin level 0.27. 1400 unit bolus and increase to 1350 units/hr. Recheck in 6 hours.   Cher NakaiSheema Hallaji, PharmD Pharmacy Resident

## 2015-08-22 NOTE — Progress Notes (Signed)
ANTIBIOTIC CONSULT NOTE - INITIAL  Pharmacy Consult for VANCOMYCIN/ZOSYN Indication: Sepsis  Allergies  Allergen Reactions  . Antihistamines, Diphenhydramine-Type Anaphylaxis and Rash  . Benadryl [Diphenhydramine Hcl] Anaphylaxis and Rash  . Hydroxyzine Anaphylaxis and Rash    Patient Measurements: Height: 5\' 11"  (180.3 cm) Weight: 218 lb 5 oz (99.026 kg) IBW/kg (Calculated) : 75.3 Adjusted Body Weight: 84.8 kg  Vital Signs: Temp: 101.1 F (38.4 C) (11/05 0800) BP: 123/92 mmHg (11/05 0800) Pulse Rate: 106 (11/05 0800) Intake/Output from previous day: 11/04 0701 - 11/05 0700 In: 211 [I.V.:136; IV Piggyback:75] Out: 850 [Urine:850] Intake/Output from this shift:    Labs:  Recent Labs  08/21/15 1247 08/22/15 0241  WBC 5.8 5.2  HGB 15.9 14.3  PLT PLATELET CLUMPS NOTED ON SMEAR, COUNT APPEARS DECREASED 56*  CREATININE 2.91* 2.03*   Estimated Creatinine Clearance: 54 mL/min (by C-G formula based on Cr of 2.03).   Microbiology: Recent Results (from the past 720 hour(s))  Blood Culture (routine x 2)     Status: None (Preliminary result)   Collection Time: 08/21/15 12:47 PM  Result Value Ref Range Status   Specimen Description BLOOD UNKNOWN  Final   Special Requests BOTTLES DRAWN AEROBIC AND ANAEROBIC  1CC  Final   Culture NO GROWTH < 24 HOURS  Final   Report Status PENDING  Incomplete  Blood Culture (routine x 2)     Status: None (Preliminary result)   Collection Time: 08/21/15 12:47 PM  Result Value Ref Range Status   Specimen Description BLOOD LEFT AC  Final   Special Requests BOTTLES DRAWN AEROBIC AND ANAEROBIC  6CC  Final   Culture NO GROWTH < 24 HOURS  Final   Report Status PENDING  Incomplete  MRSA PCR Screening     Status: None   Collection Time: 08/21/15 11:47 PM  Result Value Ref Range Status   MRSA by PCR NEGATIVE NEGATIVE Final    Comment:        The GeneXpert MRSA Assay (FDA approved for NASAL specimens only), is one component of a comprehensive  MRSA colonization surveillance program. It is not intended to diagnose MRSA infection nor to guide or monitor treatment for MRSA infections.     Medications:   Anti-infectives    Start     Dose/Rate Route Frequency Ordered Stop   08/22/15 2000  vancomycin (VANCOCIN) IVPB 1000 mg/200 mL premix     1,000 mg 200 mL/hr over 60 Minutes Intravenous Every 24 hours 08/21/15 1439     08/21/15 2200  piperacillin-tazobactam (ZOSYN) IVPB 3.375 g     3.375 g 12.5 mL/hr over 240 Minutes Intravenous 3 times per day 08/21/15 1431     08/21/15 2000  vancomycin (VANCOCIN) IVPB 1000 mg/200 mL premix     1,000 mg 200 mL/hr over 60 Minutes Intravenous  Once 08/21/15 1439 08/21/15 2248   08/21/15 1300  piperacillin-tazobactam (ZOSYN) IVPB 3.375 g     3.375 g 100 mL/hr over 30 Minutes Intravenous  Once 08/21/15 1245 08/21/15 1457   08/21/15 1300  vancomycin (VANCOCIN) IVPB 1000 mg/200 mL premix     1,000 mg 200 mL/hr over 60 Minutes Intravenous  Once 08/21/15 1245 08/21/15 1457     Assessment: 49 yo male found unresponsive by his [employer] Maintenance man. EMS witnessed multiple seizures Starting Vancomycin/Zosyn for sepsis per order.  PK parameters SCr: 2.03, CrCl: 4054ml/min Ke: 0.049, t1/2: 14h, Vd: 60L (adjusted body weight)  Goal of Therapy:  Vancomycin trough level 15-20 mcg/ml  Plan:  Current orders for vancomycin 1gm IV Q24H. Renal function improved. Will change to vancomycin  IV Q18H to target trough of 15-19mcg/ml.  Trough ordered prior to 4th dose which should be at steady state.  Continue zosyn 3.375gm IV Q8H extended infusion.   Pharmacy to follow per consult   Garlon Hatchet, PharmD Clinical Pharmacist   08/22/2015 9:42 AM

## 2015-08-22 NOTE — Progress Notes (Signed)
Spoke with Dr. Sheryle Hailiamond regarding temperature 101.6 (has been around 101 for majority of shift and was 103 in ER).  Per Dr. Sheryle Hailiamond order for prn ibuprofen bid.  Also updated Dr. Sheryle Hailiamond that pt lactic acid 2.1, was previous 2.3 and 2.1.  Pt is getting D5 at 100 ml/hr.  No new orders regarding lactic acid at this time per Dr. Sheryle Hailiamond.

## 2015-08-22 NOTE — Progress Notes (Signed)
[facility] [place] [facility]   PATIENT NAME: Ross [name]    MR#:  [id number]  DATE OF BIRTH:  [birth year]  SUBJECTIVE:  CHIEF COMPLAINT:   Chief Complaint  Patient presents with  . Seizures   Intubated. Sedated.  REVIEW OF SYSTEMS:   ROS unable to obtain as the patient is intubated and sedated  DRUG ALLERGIES:   Allergies  Allergen Reactions  . Antihistamines, Diphenhydramine-Type Anaphylaxis and Rash  . Benadryl [Diphenhydramine Hcl] Anaphylaxis and Rash  . Hydroxyzine Anaphylaxis and Rash    VITALS:  Blood pressure 112/82, pulse 98, temperature 100.8 F (38.2 C), temperature source Other (Comment), resp. rate 16, height 5\' 11"  (1.803 m), weight 99.026 kg (218 lb 5 oz), SpO2 99 %.  PHYSICAL EXAMINATION:  GENERAL:  49 y.o.-year-old patient lying in the bed, medically ill appearing, diaphoretic, intubated EYES: Pupils equal, round, reactive to light and accommodation. No scleral icterus. Extraocular muscles intact.  HEENT: Head atraumatic, normocephalic. Oropharynx and nasopharynx clear.  NECK:  Supple, no jugular venous distention. No thyroid enlargement, no tenderness.  LUNGS: Normal breath sounds bilaterally, no wheezing, rales,rhonchi or crepitation. No use of accessory muscles of respiration.  CARDIOVASCULAR: S1, S2 normal. No murmurs, rubs, or gallops.  ABDOMEN: Soft, nontender, nondistended. Bowel sounds present. No organomegaly or mass.  EXTREMITIES: No pedal edema, cyanosis, or clubbing. Peripheral pulses 2+ NEUROLOGIC: Intubated and sedated . Has been following commands. Moves all 4 extremities SKIN: No obvious rash, lesion, or ulcer.    LABORATORY PANEL:   CBC  Recent Labs Lab 08/22/15 0241  WBC 5.2  HGB 14.3  HCT 44.0  PLT 56*   ------------------------------------------------------------------------------------------------------------------  Chemistries   Recent Labs Lab 08/21/15 1247 08/22/15 0241  NA  145 143  K 3.7 3.5  CL 104 113*  CO2 19* 24  GLUCOSE 90 137*  BUN 38* 33*  CREATININE 2.91* 2.03*  CALCIUM 9.1 7.2*  MG 2.7*  --   AST >2275* >2275*  ALT 598* 1381*  ALKPHOS 92 71  BILITOT 2.0* 1.3*   ------------------------------------------------------------------------------------------------------------------  Cardiac Enzymes  Recent Labs Lab 08/22/15 0240  TROPONINI 1.79*   ------------------------------------------------------------------------------------------------------------------  RADIOLOGY:  Ct Head Wo Contrast  08/21/2015  CLINICAL DATA:  Refractory seizures EXAM: CT HEAD WITHOUT CONTRAST TECHNIQUE: Contiguous axial images were obtained from the base of the skull through the vertex without intravenous contrast. COMPARISON:  Head CT August 24, 2011; brain MRI August 26, 2011 FINDINGS: The ventricles are normal in size and configuration. There is no intracranial mass, hemorrhage, extra-axial fluid collection, or midline shift. By CT, the gray-white compartments appear normal. The increased T2 signal in the periventricular white matter noted on prior MR is not appreciable as abnormality on this noncontrast enhanced CT. No acute infarct in particular is evident on this study. The bony calvarium appears intact. The mastoid air cells are clear. IMPRESSION: Study within normal limits. Electronically Signed   By: Bretta BangWilliam  Woodruff III M.D.   On: 08/21/2015 13:33   Dg Chest Portable 1 View  08/21/2015  CLINICAL DATA:  Status post intubation.  Seizure. EXAM: PORTABLE CHEST 1 VIEW COMPARISON:  Earlier today FINDINGS: Satisfactory position of the ET tube with tip above the carina. Heart size appears normal. No pleural effusion or edema. No airspace consolidation. IMPRESSION: 1. No complications after E ET tube placement. The tip is in satisfactory position above the carina. Electronically Signed   By: Signa Kellaylor  Stroud M.D.   On: 08/21/2015 15:20   [facility]  [facility]   CLINICAL DATA:  Unresponsive.  Seizure activity. EXAM: PORTABLE CHEST 1 VIEW COMPARISON:  09/11/2011 FINDINGS: The heart size is normal. There is no pleural effusion or edema. No airspace consolidation identified. The visualized bony structures appear intact. IMPRESSION: 1. No acute cardiopulmonary abnormalities. Electronically Signed   By: Signa Kell M.D.   On: 08/21/2015 13:51   Dg Abd Portable 1v  08/21/2015  CLINICAL DATA:  Bedside orogastric tube placement. Patient found unresponsive by a maintenance person at his apartment complex. Multiple witnessed seizures during the ambulance tried to the hospital. EXAM: PORTABLE ABDOMEN - 1 VIEW COMPARISON:  CT abdomen and pelvis 09/03/2011. FINDINGS: Orogastric tube tip barely in the fundus of the stomach and could be advanced. Nonobstructive bowel gas pattern. Gas within normal caliber colon from cecum to rectum. Gas within normal caliber small bowel loops throughout the abdomen. An opaque foreign body (ring) projects over the right upper pelvis. Foley catheter noted within the urinary bladder. Phleboliths low in both sides of the pelvis. Degenerative changes involving the lower lumbar spine. IMPRESSION: 1. Orogastric tube tip just into the fundus of the stomach. This could be further advanced. 2. No acute abdominal abnormality. 3. Opaque foreign body (ring) projects over the right upper pelvis. If this is not in the patient's pocket, it may be in a loop of distal small bowel. Electronically Signed   By: Hulan Saas M.D.   On: 08/21/2015 16:04   US Abdomen Limited Ruq  08/21/2015  CLINICAL DATA:  Elevated liver function tests. EXAM: US ABDOMEN LIMITED - RIGHT UPPER QUADRANT COMPARISON:  09/03/2011 FINDINGS: Gallbladder: No gallstones or wall thickening visualized. No sonographic Murphy sign noted. Common bile duct: Diameter: 3.2 mm Liver: Liver shows increased echogenicity and coarsened echotexture. No mass or focal lesion. Hepatopetal flow documented in  the portal vein. IMPRESSION: 1. No acute finding.  Normal gallbladder.  No bile duct dilation. 2. Increased echogenicity of the liver consistent with fatty infiltration. This is similar to the prior ultrasound. Electronically Signed   By: Amie Portland M.D.   On: 08/21/2015 21:59    EKG:   Orders placed or performed during the hospital encounter of 08/21/15  . EKG 12-Lead  . EKG 12-Lead    ASSESSMENT AND PLAN:   #1 fever/sepsis: Likely due to C. difficile colitis - Influenza negative, UA not convincing for infection, blood cultures and urine cultures are pending - HIV and hepatitis panel is pending - 2-D echocardiogram is normal - Clostridium difficile positive will start oral vancomycin - Concern for possible NMS-like drug reaction. Have discussed with pharmacy they will update the medication reconciliation to see if he is on any psychiatric medications which could be causing this. Naltrexone could cause many of his current symptoms including elevated CK, elevated LFTs, encephalopathy and thrombocytopenia but not the fever  #2 acute respiratory failure with hypoxia and altered mental status - Pulmonology following, possible extubation today - repeat chest x-ray - Unable to obtain CT angiography due to renal failure. Heparin drip had to be stopped due to thrombocytopenia  #3 rhabdomyolysis - Possibly due to prolonged immobilization, hypotension, diarrhea - Medication effect also possible - Continue hydration  #4 acute renal failure: Improving - Due to ATN, rhabdomyolysis - Appreciate nephrology following - Continue IV fluids and bicarbonate  #5 thrombocytopenia - Question NMS - Possible TTP with thrombocytopenia, renal failure, fever, and neurologic changes. Though he does not have anemia  - Possibly due to sepsis in the setting of  C. difficile infection  #6 elevated troponin - No concerning EKG changes, no events on telemetry. Troponin has been consistently elevated and  actually trending upward slightly. - Heparin drip stopped due to thrombocytopenia - 2-D echocardiogram is normal with no focal wall motion abnormalities - Likely related to rhabdo  #7 altered mental status - Likely metabolic encephalopathy, we'll continue to follow  #8 acute hepatitis - Viral hepatitis panel is pending - Some of the AST likely due to rhabdomyolysis - Likely shock liver  All the records are reviewed and case discussed with Care Management/Social Workerr. Management plans discussed with the patient, family and they are in agreement.  CODE STATUS: Full   TOTAL CRITICAL CARE TIME TAKING CARE OF THIS PATIENT: 45 minutes.  Greater than 50% of time spent in care coordination and counseling. POSSIBLE D/C IN ? DAYS, DEPENDING ON CLINICAL CONDITION.   Elby Showers M.D on 08/22/2015 at 3:23 PM  Between 7am to 6pm - Pager - [phone]  After 6pm go to [email or web address] - password [email or web address] [facility]  Ross [name]  Office  [phone]  CC: Primary care physician; No primary care provider on file.

## 2015-08-22 NOTE — Progress Notes (Signed)
Initial Nutrition Assessment     INTERVENTION:  Coordination of care: Spoke with MD Kasa this am and wanting to hold on starting nutrition at this time, possible extubation.   NUTRITION DIAGNOSIS:   Inadequate oral intake related to acute illness as evidenced by NPO status.    GOAL:   Provide needs based on ASPEN/SCCM guidelines    MONITOR:    (Energy intake, Digestive system, Electrolyte and renal profile)  REASON FOR ASSESSMENT:   Ventilator    ASSESSMENT:      Pt admitted with acute respiratory failure, sepsis, AMS, elevated LFTs  Past Medical History  Diagnosis Date  . Sleep apnea     had surgery to correct it  . Mental disorder     PTSD  . Seizures (HCC)     currently dx with seizures  . Cardiac arrest (HCC) 08/23/2011  . Pneumonia 09/04/2011  . Pancreatitis   . SOB (shortness of breath) 09/11/2011  . Hypertension   . Anxiety   . Myocardial infarction (HCC)   . Schizophrenia (HCC)     Current Nutrition: NPO  Food/Nutrition-Related History: unable to determine at this time   Scheduled Medications:  . antiseptic oral rinse  7 mL Mouth Rinse QID  . chlorhexidine gluconate  15 mL Mouth Rinse BID  . insulin aspart  0-5 Units Subcutaneous QHS  . insulin aspart  0-9 Units Subcutaneous TID WC  . pantoprazole (PROTONIX) IV  40 mg Intravenous Q24H  . piperacillin-tazobactam (ZOSYN)  IV  3.375 g Intravenous 3 times per day  . vancomycin  1,250 mg Intravenous Q18H  . vancomycin  125 mg Oral 4 times per day    Continuous Medications:  . sodium chloride 150 mL/hr at 08/22/15 1035  . fentaNYL infusion INTRAVENOUS 200 mcg/hr (08/22/15 0700)  . midazolam (VERSED) infusion 2.5 mg/hr (08/22/15 0700)     Electrolyte/Renal Profile and Glucose Profile:   Recent Labs Lab 08/21/15 1247 08/22/15 0241  NA 145 143  K 3.7 3.5  CL 104 113*  CO2 19* 24  BUN 38* 33*  CREATININE 2.91* 2.03*  CALCIUM 9.1 7.2*  MG 2.7*  --   GLUCOSE 90 137*   Protein  Profile:  Recent Labs Lab 08/21/15 1247 08/22/15 0241  ALBUMIN 4.1 3.4*    Gastrointestinal Profile: per RN, Charli abdomen with some distention, OG clamped, previously noted hooked to LIS with bloody, coffee ground secretions documented.  Last WU:[id number]:unknown   Nutrition-Focused Physical Exam Findings:  Unable to complete Nutrition-Focused physical exam at this time.     Weight Change: wt encounters reviewed    Diet Order:  Diet NPO time specified  Skin:   reviewed   Height:   Ht Readings from Last 1 Encounters:  08/21/15 5\' 11"  (1.803 m)    Weight:   Wt Readings from Last 1 Encounters:  08/21/15 218 lb 5 oz (99.026 kg)       BMI:  Body mass index is 30.46 kg/(m^2).  Estimated Nutritional Needs:   Kcal:  (11/14 kcals/kg) [id number] kcals/d  Protein:  (156-195 g/kg) (2.0-2.5 g/kg Using IBW of 78kg)  Fluid:  (25-6530ml/kg) 1950-237740ml/d  EDUCATION NEEDS:   No education needs identified at this time  HIGH Care Level  Priscillia Fouch B. Freida BusmanAllen, RD, LDN [phone] (pager)

## 2015-08-22 NOTE — Progress Notes (Signed)
ANTICOAGULATION CONSULT NOTE - Initial Consult  Pharmacy Consult for Heparin  Indication: chest pain/ACS  Allergies  Allergen Reactions  . Antihistamines, Diphenhydramine-Type Anaphylaxis and Rash  . Benadryl [Diphenhydramine Hcl] Anaphylaxis and Rash  . Hydroxyzine Anaphylaxis and Rash    Patient Measurements: Height: 5\' 11"  (180.3 cm) Weight: 218 lb 5 oz (99.026 kg) IBW/kg (Calculated) : 75.3 Heparin Dosing Weight: 95 kg  Vital Signs: Temp: 101.1 F (38.4 C) (11/05 0800) BP: 123/92 mmHg (11/05 0800) Pulse Rate: 106 (11/05 0800)  Labs:  Recent Labs  08/21/15 1247 08/21/15 1615 08/21/15 2031 08/22/15 0022 08/22/15 0240 08/22/15 0241 08/22/15 0904  HGB 15.9  --   --   --   --  14.3  --   HCT 47.2  --   --   --   --  44.0  --   PLT PLATELET CLUMPS NOTED ON SMEAR, COUNT APPEARS DECREASED  --   --   --   --  56*  --   APTT 28  --   --   --   --   --   --   LABPROT 14.3  --   --   --   --   --   --   INR 1.09  --   --   --   --   --   --   HEPARINUNFRC  --  <0.10*  --  0.27*  --   --  0.37  CREATININE 2.91*  --   --   --   --  2.03*  --   CKTOTAL [id number]*  --   --   --   --   --   --   TROPONINI 1.56*  --  1.73*  --  1.79*  --   --     Estimated Creatinine Clearance: 54 mL/min (by C-G formula based on Cr of 2.03).   Medical History: Past Medical History  Diagnosis Date  . Sleep apnea     had surgery to correct it  . Mental disorder     PTSD  . Seizures (HCC)     currently dx with seizures  . Cardiac arrest (HCC) 08/23/2011  . Pneumonia 09/04/2011  . Pancreatitis   . SOB (shortness of breath) 09/11/2011  . Hypertension   . Anxiety   . Myocardial infarction (HCC)   . Schizophrenia [facility](HCC)     Assessment: 49 yo male patient who was found unresponsive at his home. He is currently intubated with elevated troponins and positive urine drug screen for benzos and cannabinoid. Patient has had multiple seizures and per MD now with multisystem organ failure. Pharmacy  consulted for heparin dosing for ACS.   Goal of Therapy:  Heparin level 0.3-0.7 units/ml Monitor platelets by anticoagulation protocol: Yes   Plan:  Current orders for heparin 1350 units/hr. Heparin level 0.37 - therapeutic. Platelets this am 56k, heparin drip stopped by MD. Will d/c consult and orders for heparin at this time.  Garlon HatchetJody Emaleigh Guimond, PharmD Clinical Pharmacist  08/22/2015 9:39 AM

## 2015-08-22 NOTE — Consult Note (Signed)
Date: 08/22/2015                  Patient Name:  JERAMIE SCOGIN  MRN: [id number]  DOB: Jul 18, 1966  Age / Sex: 49 y.o., male         PCP: No primary care provider on file.                 Service Requesting Consult:  ICU team/Dr. Belia Heman                  Reason for Consult:  acute renal failure, rhabdomyolysis             History of Present Illness: Patient is a 49 y.o. male with medical problems of alcohol abuse, schizophrenia, seizure disorder, coronary disease status post MI in 2012, who was admitted to [facility] on 08/21/2015 after being found unresponsive in his apartment. No prior contact for over 24 hours. Patient is currently intubated and sedated. He is not able to provide any information. All information is obtained from the chart and primary team. He was intubated in the emergency room for respiratory failure. On presentation, his clothes were soaked with urine and feces. He had a high-grade fever, multiorgan failure and high lactate level. He is admitted for further evaluation and management Baseline creatinine appears to be 1.12/GFR of greater than 60 from June 2016 Presenting creatinine was 2.91 on November 4 Today's creatinine has improved to 2.03 AST, AST remained significantly elevated Bilirubin is elevated, lactic acid level was elevated at presentation CPK level greater than 38,000 yesterday    Medications: Outpatient medications: Prescriptions prior to admission  Medication Sig Dispense Refill Last Dose  . acetaminophen (TYLENOL) 325 MG tablet Take 650 mg by mouth every 4 (four) hours as needed for mild pain or fever.    PRN at PRN  . fluticasone (FLONASE) 50 MCG/ACT nasal spray Place 1-2 sprays into both nostrils at bedtime.    unknown at unknown  . folic acid (FOLVITE) 1 MG tablet Take 1 mg by mouth daily.   unknown at unknown  . Melatonin 3 MG TABS Take 3 mg by mouth at bedtime.    unknown at unknown  . metFORMIN (GLUCOPHAGE) 500 MG tablet Take 500 mg by mouth  daily.    unknown at unknown  . Multiple Vitamin (MULTIVITAMIN WITH MINERALS) TABS tablet Take 1 tablet by mouth daily.   unknown at unknown  . naltrexone (DEPADE) 50 MG tablet Take 50 mg by mouth daily.   unknown at unknown  . thiamine 100 MG tablet Take 100 mg by mouth daily.   unknown at unknown  . triamcinolone cream (KENALOG) 0.1 % Apply 1 application topically 2 (two) times daily as needed (for rash).   PRN at PRN  . metroNIDAZOLE (FLAGYL) 500 MG tablet Take 1 tablet (500 mg total) by mouth 3 (three) times daily. (Patient not taking: Reported on 08/21/2015) 42 tablet 0     Current medications: Current Facility-Administered Medications  Medication Dose Route Frequency Provider Last Rate Last Dose  . 0.9 %  sodium chloride infusion   Intravenous Continuous Erin Fulling, MD 150 mL/hr at 08/22/15 1035    . acetaminophen (TYLENOL) tablet 650 mg  650 mg Oral Q6H PRN Gale Journey, MD   650 mg at 08/22/15 0159   Or  . acetaminophen (TYLENOL) suppository 650 mg  650 mg Rectal Q6H PRN Gale Journey, MD      . antiseptic oral rinse solution (CORINZ)  7 mL Mouth Rinse QID Gale Journey, MD   7 mL at 08/22/15 0541  . chlorhexidine gluconate (PERIDEX) 0.12 % solution 15 mL  15 mL Mouth Rinse BID Gale Journey, MD   15 mL at 08/22/15 0809  . fentaNYL in NS (37mcg/ml) infusion-PREMIX  100 mcg/hr Intravenous Continuous Loleta Rose, MD 20 mL/hr at 08/22/15 0700 200 mcg/hr at 08/22/15 0700  . ibuprofen (ADVIL,MOTRIN) tablet 800 mg  800 mg Oral BID PRN Arnaldo Natal, MD   800 mg at 08/22/15 0525  . insulin aspart (novoLOG) injection 0-5 Units  0-5 Units Subcutaneous QHS Gale Journey, MD   0 Units at 08/21/15 2318  . insulin aspart (novoLOG) injection 0-9 Units  0-9 Units Subcutaneous TID WC Gale Journey, MD   1 Units at 08/22/15 [phone]  . midazolam (VERSED) 50 mg in sodium chloride 0.9 % 50 mL (1 mg/mL) infusion  1 mg/hr Intravenous Continuous Loleta Rose, MD 2.5  mL/hr at 08/22/15 0700 2.5 mg/hr at 08/22/15 0700  . pantoprazole (PROTONIX) injection 40 mg  40 mg Intravenous Q24H Karl Ito, MD   40 mg at 08/21/15 2148  . piperacillin-tazobactam (ZOSYN) IVPB 3.375 g  3.375 g Intravenous 3 times per day Loleta Rose, MD   3.375 g at 08/22/15 0536  . vancomycin (VANCOCIN) 1,250 mg in sodium chloride 0.9 % 250 mL IVPB  1,250 mg Intravenous Q18H Gale Journey, MD      . vancomycin (VANCOCIN) 50 mg/mL oral solution 125 mg  125 mg Oral 4 times per day Erin Fulling, MD          Allergies: Allergies  Allergen Reactions  . Antihistamines, Diphenhydramine-Type Anaphylaxis and Rash  . Benadryl [Diphenhydramine Hcl] Anaphylaxis and Rash  . Hydroxyzine Anaphylaxis and Rash      Past Medical History: Past Medical History  Diagnosis Date  . Sleep apnea     had surgery to correct it  . Mental disorder     PTSD  . Seizures (HCC)     currently dx with seizures  . Cardiac arrest (HCC) 08/23/2011  . Pneumonia 09/04/2011  . Pancreatitis   . SOB (shortness of breath) 09/11/2011  . Hypertension   . Anxiety   . Myocardial infarction (HCC)   . Schizophrenia [facility])      Past Surgical History: Past Surgical History  Procedure Laterality Date  . Tonsillectomy    . Uvulopalatopharyngoplasty, tonsillectomy and septoplasty  06/16/2000  . Left heart catheterization with coronary angiogram N/A 08/24/2011    Procedure: LEFT HEART CATHETERIZATION WITH CORONARY ANGIOGRAM;  Surgeon: Corky Crafts, MD;  Location: [facility] CATH LAB;  Service: Cardiovascular;  Laterality: N/A;     Family History: Family History  Problem Relation Age of Onset  . Heart attack Mother      Social History: Social History   Social History  . Marital Status: Married    Spouse Name: N/A  . Number of Children: N/A  . Years of Education: N/A   Occupational History  . Not on file.   Social History Main Topics  . Smoking status: Former Smoker    Types: Cigarettes  .  Smokeless tobacco: Never Used  . Alcohol Use: Yes     Comment: heavy, has a "drinking problem" per mother  . Drug Use: No  . Sexual Activity: Not Currently   Other Topics Concern  . Not on file   Social History Narrative     Review of Systems: Not  available as patient is critically ill, intubated and sedated  Gen:  HEENT:  CV:  Resp:  GI: GU :  MS:  Derm:   Psych: Heme:  Neuro:  Endocrine  Vital Signs: Blood pressure 123/92, pulse 106, temperature 101.1 F (38.4 C), temperature source Other (Comment), resp. rate 16, height  (1.803 m), weight 99.026 kg (218 lb 5 oz), SpO2 98 %.   Intake/Output Summary (Last 24 hours) at 08/22/15 1109 Last data filed at 08/22/15 1035  Gross per 24 hour  Intake 789.67 ml  Output    850 ml  Net -60.33 ml    Weight trends: [employer]   08/21/15 1240  Weight: 99.026 kg (218 lb 5 oz)    Physical Exam: General:   critically ill-appearing,  HEENT  red conjunctiva  Neck:   supple  Lungs:  ventilator assisted, FiO2 25%  Heart::   tachycardiac, regular, no rub or gallop  Abdomen:  soft, doughy consistency, nontender, nondistended  Extremities:   no peripheral edema  Neurologic:  sedated  Skin:  no acute rashes  Access:   Foley:  present with dark concentrated urine       Lab results: Basic Metabolic Panel:  Recent Labs Lab 08/21/15 1247 08/22/15 0241  NA 145 143  K 3.7 3.5  CL 104 113*  CO2 19* 24  GLUCOSE 90 137*  BUN 38* 33*  CREATININE 2.91* 2.03*  CALCIUM 9.1 7.2*  MG 2.7*  --     Liver Function Tests:  Recent Labs Lab 08/22/15 0241  AST >2275*  ALT 1381*  ALKPHOS 71  BILITOT 1.3*  PROT 6.5  ALBUMIN 3.4*    Recent Labs Lab 08/21/15 1247  LIPASE 18   No results for input(s): AMMONIA in the last 168 hours.  CBC:  Recent Labs Lab 08/21/15 1247 08/22/15 0241  WBC 5.8 5.2  NEUTROABS 4.7  --   HGB 15.9 14.3  HCT 47.2 44.0  MCV 90.9 90.4  PLT PLATELET CLUMPS NOTED ON SMEAR, COUNT  APPEARS DECREASED 56*    Cardiac Enzymes:  Recent Labs Lab 08/21/15 1247  08/22/15 0240  CKTOTAL 16109*  --   --   TROPONINI 1.56*  < > 1.79*  < > = values in this interval not displayed.  BNP: Invalid input(s): POCBNP  CBG:  Recent Labs Lab 08/21/15 1239 08/21/15 1533 08/21/15 1914 08/21/15 2304 08/22/15 0747  GLUCAP 91 107* 166* 188* 134*    Microbiology: Recent Results (from the past 720 hour(s))  Blood Culture (routine x 2)     Status: None (Preliminary result)   Collection Time: 08/21/15 12:47 PM  Result Value Ref Range Status   Specimen Description BLOOD UNKNOWN  Final   Special Requests BOTTLES DRAWN AEROBIC AND ANAEROBIC  1CC  Final   Culture NO GROWTH < 24 HOURS  Final   Report Status PENDING  Incomplete  Blood Culture (routine x 2)     Status: None (Preliminary result)   Collection Time: 08/21/15 12:47 PM  Result Value Ref Range Status   Specimen Description BLOOD LEFT AC  Final   Special Requests BOTTLES DRAWN AEROBIC AND ANAEROBIC  6CC  Final   Culture NO GROWTH < 24 HOURS  Final   Report Status PENDING  Incomplete  MRSA PCR Screening     Status: None   Collection Time: 08/21/15 11:47 PM  Result Value Ref Range Status   MRSA by PCR NEGATIVE NEGATIVE Final    Comment:  The GeneXpert MRSA Assay (FDA approved for NASAL specimens only), is one component of a comprehensive MRSA colonization surveillance program. It is not intended to diagnose MRSA infection nor to guide or monitor treatment for MRSA infections.      Coagulation Studies:  Recent Labs  08/21/15 1247  LABPROT 14.3  INR 1.09    Urinalysis:  Recent Labs  08/21/15 1247  COLORURINE AMBER*  LABSPEC 1.024  PHURINE 5.0  GLUCOSEU NEGATIVE  HGBUR 3+*  BILIRUBINUR NEGATIVE  KETONESUR 1+*  PROTEINUR 100*  NITRITE NEGATIVE  LEUKOCYTESUR NEGATIVE      Imaging: Ct Head Wo Contrast  08/21/2015  CLINICAL DATA:  Refractory seizures EXAM: CT HEAD WITHOUT CONTRAST  TECHNIQUE: Contiguous axial images were obtained from the base of the skull through the vertex without intravenous contrast. COMPARISON:  Head CT August 24, 2011; brain MRI August 26, 2011 FINDINGS: The ventricles are normal in size and configuration. There is no intracranial mass, hemorrhage, extra-axial fluid collection, or midline shift. By CT, the gray-white compartments appear normal. The increased T2 signal in the periventricular white matter noted on prior MR is not appreciable as abnormality on this noncontrast enhanced CT. No acute infarct in particular is evident on this study. The bony calvarium appears intact. The mastoid air cells are clear. IMPRESSION: Study within normal limits. Electronically Signed   By: Bretta BangWilliam  Woodruff III M.D.   On: 08/21/2015 13:33   Dg Chest Portable 1 View  08/21/2015  CLINICAL DATA:  Status post intubation.  Seizure. EXAM: PORTABLE CHEST 1 VIEW COMPARISON:  Earlier today FINDINGS: Satisfactory position of the ET tube with tip above the carina. Heart size appears normal. No pleural effusion or edema. No airspace consolidation. IMPRESSION: 1. No complications after E ET tube placement. The tip is in satisfactory position above the carina. Electronically Signed   By: Signa Kellaylor  Stroud M.D.   On: 08/21/2015 15:20   Dg Chest Port 1 View  08/21/2015  CLINICAL DATA:  Unresponsive.  Seizure activity. EXAM: PORTABLE CHEST 1 VIEW COMPARISON:  09/11/2011 FINDINGS: The heart size is normal. There is no pleural effusion or edema. No airspace consolidation identified. The visualized bony structures appear intact. IMPRESSION: 1. No acute cardiopulmonary abnormalities. Electronically Signed   By: Signa Kellaylor  Stroud M.D.   On: 08/21/2015 13:51   Dg Abd Portable 1v  08/21/2015  CLINICAL DATA:  Bedside orogastric tube placement. Patient found unresponsive by a maintenance person at his apartment complex. Multiple witnessed seizures during the ambulance tried to the hospital. EXAM: PORTABLE  ABDOMEN - 1 VIEW COMPARISON:  CT abdomen and pelvis 09/03/2011. FINDINGS: Orogastric tube tip barely in the fundus of the stomach and could be advanced. Nonobstructive bowel gas pattern. Gas within normal caliber colon from cecum to rectum. Gas within normal caliber small bowel loops throughout the abdomen. An opaque foreign body (ring) projects over the right upper pelvis. Foley catheter noted within the urinary bladder. Phleboliths low in both sides of the pelvis. Degenerative changes involving the lower lumbar spine. IMPRESSION: 1. Orogastric tube tip just into the fundus of the stomach. This could be further advanced. 2. No acute abdominal abnormality. 3. Opaque foreign body (ring) projects over the right upper pelvis. If this is not in the patient's pocket, it may be in a loop of distal small bowel. Electronically Signed   By: Hulan Saashomas  Lawrence M.D.   On: 08/21/2015 16:04   [place] Abdomen Limited Ruq  08/21/2015  CLINICAL DATA:  Elevated liver function tests. EXAM: US ABDOMEN LIMITED -  RIGHT UPPER QUADRANT COMPARISON:  09/03/2011 FINDINGS: Gallbladder: No gallstones or wall thickening visualized. No sonographic Murphy sign noted. Common bile duct: Diameter: 3.2 mm Liver: Liver shows increased echogenicity and coarsened echotexture. No mass or focal lesion. Hepatopetal flow documented in the portal vein. IMPRESSION: 1. No acute finding.  Normal gallbladder.  No bile duct dilation. 2. Increased echogenicity of the liver consistent with fatty infiltration. This is similar to the prior ultrasound. Electronically Signed   By: Amie Portland M.D.   On: 08/21/2015 21:59      Assessment & Plan: Pt is a 49 y.o. yo male with alcohol abuse, schizophrenia, seizure disorder, coronary disease status post MI in 2012, who was admitted to [facility] on 08/21/2015 after being found unresponsive in his apartment. No prior contact for over 24 hours. Urine toxicology screen is positive for benzodiazepines and cannabinoids  1. Acute  renal failure Likely secondary to ATN from concurrent illness/volume depletion/rhabdomyolysis Serum creatinine slightly improved as compared to yesterday At present, volume status and electrolytes are acceptable. No acute indication for dialysis Avoid nephrotoxins Use nonsteroidals sparingly  2. Acute rhabdomyolysis Would like to continue to monitor CPK level at least daily We'll supplement low-dose IV bicarbonate for alkalinization of urine   3. Acute respiratory failure Ventilator assisted at this time

## 2015-08-23 ENCOUNTER — Inpatient Hospital Stay: Payer: Medicare Other

## 2015-08-23 ENCOUNTER — Encounter: Payer: Self-pay | Admitting: Internal Medicine

## 2015-08-23 DIAGNOSIS — N179 Acute kidney failure, unspecified: Secondary | ICD-10-CM

## 2015-08-23 DIAGNOSIS — J9600 Acute respiratory failure, unspecified whether with hypoxia or hypercapnia: Secondary | ICD-10-CM

## 2015-08-23 DIAGNOSIS — R569 Unspecified convulsions: Secondary | ICD-10-CM

## 2015-08-23 DIAGNOSIS — A0472 Enterocolitis due to Clostridium difficile, not specified as recurrent: Secondary | ICD-10-CM

## 2015-08-23 DIAGNOSIS — M6282 Rhabdomyolysis: Secondary | ICD-10-CM

## 2015-08-23 DIAGNOSIS — T796XXS Traumatic ischemia of muscle, sequela: Secondary | ICD-10-CM

## 2015-08-23 HISTORY — DX: Rhabdomyolysis: M62.82

## 2015-08-23 HISTORY — DX: Acute kidney failure, unspecified: N17.9

## 2015-08-23 LAB — URINALYSIS COMPLETE WITH MICROSCOPIC (ARMC ONLY)
Bilirubin Urine: NEGATIVE
GLUCOSE, UA: 50 mg/dL — AB
Nitrite: NEGATIVE
PROTEIN: 100 mg/dL — AB
Specific Gravity, Urine: 1.017 (ref 1.005–1.030)
pH: 5 (ref 5.0–8.0)

## 2015-08-23 LAB — BLOOD GAS, ARTERIAL
Acid-base deficit: 2.9 mmol/L — ABNORMAL HIGH (ref 0.0–2.0)
Allens test (pass/fail): POSITIVE — AB
Bicarbonate: 22.6 mEq/L (ref 21.0–28.0)
FIO2: 0.35
O2 SAT: 98.6 %
PCO2 ART: 41 mmHg (ref 32.0–48.0)
Patient temperature: 37
pH, Arterial: 7.35 (ref 7.350–7.450)
pO2, Arterial: 124 mmHg — ABNORMAL HIGH (ref 83.0–108.0)

## 2015-08-23 LAB — HEPATITIS PANEL, ACUTE
HCV Ab: 0.2 s/co ratio (ref 0.0–0.9)
Hep A IgM: NEGATIVE
Hep B C IgM: NEGATIVE
Hepatitis B Surface Ag: NEGATIVE

## 2015-08-23 LAB — URINE CULTURE
Culture: NO GROWTH
SPECIAL REQUESTS: NORMAL

## 2015-08-23 LAB — GLUCOSE, CAPILLARY
GLUCOSE-CAPILLARY: 82 mg/dL (ref 65–99)
GLUCOSE-CAPILLARY: 97 mg/dL (ref 65–99)
GLUCOSE-CAPILLARY: 72 mg/dL (ref 65–99)
Glucose-Capillary: 93 mg/dL (ref 65–99)

## 2015-08-23 LAB — CBC WITH DIFFERENTIAL/PLATELET
BASOS ABS: 0 10*3/uL (ref 0–0.1)
EOS ABS: 0 10*3/uL (ref 0–0.7)
HCT: 41.8 % (ref 40.0–52.0)
Hemoglobin: 13.6 g/dL (ref 13.0–18.0)
Lymphocytes Relative: 6 %
Lymphs Abs: 0.3 10*3/uL — ABNORMAL LOW (ref 1.0–3.6)
MCH: 29.8 pg (ref 26.0–34.0)
MCHC: 32.4 g/dL (ref 32.0–36.0)
MCV: 91.7 fL (ref 80.0–100.0)
MONO ABS: 0.4 10*3/uL (ref 0.2–1.0)
Monocytes Relative: 8 %
NEUTROS ABS: 4.9 10*3/uL (ref 1.4–6.5)
Neutrophils Relative %: 86 %
PLATELETS: 51 10*3/uL — AB (ref 150–440)
RBC: 4.56 MIL/uL (ref 4.40–5.90)
RDW: 14.5 % (ref 11.5–14.5)
WBC: 5.7 10*3/uL (ref 3.8–10.6)

## 2015-08-23 LAB — RENAL FUNCTION PANEL
Albumin: 2.4 g/dL — ABNORMAL LOW (ref 3.5–5.0)
Anion gap: 13 (ref 5–15)
BUN: 45 mg/dL — ABNORMAL HIGH (ref 6–20)
CALCIUM: 6.5 mg/dL — AB (ref 8.9–10.3)
CO2: 24 mmol/L (ref 22–32)
CREATININE: 4.15 mg/dL — AB (ref 0.61–1.24)
Chloride: 107 mmol/L (ref 101–111)
GFR calc Af Amer: 18 mL/min — ABNORMAL LOW (ref >60)
GFR calc non Af Amer: 16 mL/min — ABNORMAL LOW (ref >60)
GLUCOSE: 97 mg/dL (ref 65–99)
Phosphorus: 4.7 mg/dL — ABNORMAL HIGH (ref 2.5–4.6)
Potassium: 4.2 mmol/L (ref 3.5–5.1)
SODIUM: 144 mmol/L (ref 135–145)

## 2015-08-23 LAB — HIV ANTIBODY (ROUTINE TESTING W REFLEX): HIV SCREEN 4TH GENERATION: NONREACTIVE

## 2015-08-23 LAB — CREATININE, SERUM
CREATININE: 3.13 mg/dL — AB (ref 0.61–1.24)
GFR calc non Af Amer: 22 mL/min — ABNORMAL LOW (ref >60)
GFR, EST AFRICAN AMERICAN: 26 mL/min — AB (ref >60)

## 2015-08-23 MED ORDER — VECURONIUM BROMIDE 10 MG IV SOLR
10.0000 mg | Freq: Once | INTRAVENOUS | Status: AC
  Administered 2015-08-23: 10 mg via INTRAVENOUS
  Filled 2015-08-23: qty 10

## 2015-08-23 MED ORDER — ALTEPLASE 2 MG IJ SOLR: 2.0000 mg | Freq: Once | INTRAMUSCULAR | Status: DC

## 2015-08-23 MED ORDER — FUROSEMIDE 10 MG/ML IJ SOLN
60.0000 mg | Freq: Once | INTRAMUSCULAR | Status: AC
  Administered 2015-08-23: 60 mg via INTRAVENOUS
  Filled 2015-08-23: qty 6

## 2015-08-23 NOTE — Procedures (Signed)
Central Venous Catheter Placement: Indication: Patient receiving vesicant or irritant drug.; Patient receiving intravenous therapy for longer than 5 days.; Patient has limited or no vascular access.   Consent:emergent  Risks and benefits explained in detail including risk of infection, bleeding, respiratory failure and death..   Hand washing performed prior to starting the procedure.   Procedure: An active timeout was performed and correct patient, name, & ID confirmed.  After explaining risk and benefits, patient was positioned correctly for central venous access. Patient was prepped using strict sterile technique including chlorohexadine preps, sterile drape, sterile gown and sterile gloves.  The area was prepped, draped and anesthetized in the usual sterile manner. Patient comfort was obtained.  A triple lumen catheter was placed in RT  Internal Jugular Vein There was good blood return, catheter caps were placed on lumens, catheter flushed easily, the line was secured and a sterile dressing and BIO-PATCH applied.   Ultrasound was used to visualize vasculature and guidance of needle.   Number of Attempts: 1 Complications:none Estimated Blood Loss: none Chest Radiograph indicated and ordered.  Operator: Maham Quintin.   Bohdan Macho David Emmakate Hypes, M.D.  [facility]  Medical Director ICU-[facility] [place] Medical Director [facility] Cardio-Pulmonary Department     

## 2015-08-23 NOTE — Progress Notes (Signed)
The phone numbers listed in the chart were tried by the nursing staff as well as the medical staff. The numbers are disconnected. Due to patient's critical illness and worsening clinical condition, we will proceed with emergent dialysis catheter placement as well as proceed with hemodialysis later today. Family will be updated when available.

## 2015-08-23 NOTE — Consult Note (Signed)
[facility]  CARDIOLOGY CONSULT NOTE  Patient ID: Ross SpillersJames L Nichter MRN: [id number] DOB/AGE: Jan 24, 1966 49 y.o.  Admit date: 08/21/2015 Referring Physician Dr. Clent RidgesWalsh Primary Physician   Primary Cardiologist   Reason for Consultation Elevated troponin  HPI: Pt is a 49 yo male with  History of etoh abuse, seizure disorder, schizophrenia,  History of cardiac cath in 2012 showing normal coronary arteries and normal lv function, who was admitted after being found unresponsive in his apartment. There was reported seizure like acivity on ems arrival and during transport. He was intubated due to hypoxia and respiratory failure. He was incontinant of stool and urine. He was admitted with fever of 103 , lactate of 2.3 and respiratory failure, acute renal insuffiency, with elevated lfts, and elevated cpk suggestive of rhabdo. He had an elevated serum troponin to 1.79. Echo showed ef 50-55% mild tr and mr. No regional wall motion abnormality  ROS Review of Systems - History obtained from chart review and unobtainable from patient due to mental status General ROS: negative Respiratory ROS: negative Cardiovascular ROS: negative Gastrointestinal ROS: negative Neurological ROS: negative   Past Medical History  Diagnosis Date  . Sleep apnea     had surgery to correct it  . Mental disorder     PTSD  . Seizures (HCC)     currently dx with seizures  . Cardiac arrest (HCC) 08/23/2011  . Pneumonia 09/04/2011  . Pancreatitis   . SOB (shortness of breath) 09/11/2011  . Hypertension   . Anxiety   . Myocardial infarction (HCC)   . Schizophrenia (HCC)   . Rhabdomyolysis 08/23/2015  . Acute renal failure (HCC) 08/23/2015    Family History  Problem Relation Age of Onset  . Heart attack Mother     Social History   Social History  . Marital Status: Married    Spouse Name: N/A  . Number of Children: N/A  . Years of Education: N/A   Occupational History  .  Not on file.   Social History Main Topics  . Smoking status: Former Smoker    Types: Cigarettes  . Smokeless tobacco: Never Used  . Alcohol Use: Yes     Comment: heavy, has a "drinking problem" per mother  . Drug Use: No  . Sexual Activity: Not Currently   Other Topics Concern  . Not on file   Social History Narrative    Past Surgical History  Procedure Laterality Date  . Tonsillectomy    . Uvulopalatopharyngoplasty, tonsillectomy and septoplasty  06/16/2000  . Left heart catheterization with coronary angiogram N/A 08/24/2011    Procedure: LEFT HEART CATHETERIZATION WITH CORONARY ANGIOGRAM;  Surgeon: Corky CraftsJayadeep S Varanasi, MD;  Location: [facility] CATH LAB;  Service: Cardiovascular;  Laterality: N/A;     Prescriptions prior to admission  Medication Sig Dispense Refill Last Dose  . acetaminophen (TYLENOL) 325 MG tablet Take 650 mg by mouth every 4 (four) hours as needed for mild pain or fever.    PRN at PRN  . fluticasone (FLONASE) 50 MCG/ACT nasal spray Place 1-2 sprays into both nostrils at bedtime.    unknown at unknown  . folic acid (FOLVITE) 1 MG tablet Take 1 mg by mouth daily.   unknown at unknown  . metFORMIN (GLUCOPHAGE) 500 MG tablet Take 500 mg by mouth daily.    unknown at unknown  . Multiple Vitamin (MULTIVITAMIN WITH MINERALS) TABS tablet Take 1 tablet by mouth daily.   unknown at unknown  . naltrexone (  DEPADE) 50 MG tablet Take 50 mg by mouth daily.   unknown at unknown  . thiamine 100 MG tablet Take 100 mg by mouth daily.   unknown at unknown  . triamcinolone cream (KENALOG) 0.1 % Apply 1 application topically 2 (two) times daily as needed (for rash).   PRN at PRN  . Melatonin 3 MG TABS Take 3 mg by mouth at bedtime.    Completed Course at unknown  . metroNIDAZOLE (FLAGYL) 500 MG tablet Take 1 tablet (500 mg total) by mouth 3 (three) times daily. (Patient not taking: Reported on 08/21/2015) 42 tablet 0     Physical Exam: Blood pressure 101/73, pulse 87, temperature 99.3 F  (37.4 C), temperature source Other (Comment), resp. rate 16, height  (1.803 m), weight 99.026 kg (218 lb 5 oz), SpO2 100 %.    General appearance: intubated and sedated Resp: rhonchi bilaterally Cardio: regular rate and rhythm GI: normal findings: bowel sounds normal Extremities: extremities normal, atraumatic, no cyanosis or edema Neurologic: Mental status: intubataed and sedated Labs:   Lab Results  Component Value Date   WBC 5.2 08/22/2015   HGB 14.3 08/22/2015   HCT 44.0 08/22/2015   MCV 90.4 08/22/2015   PLT 56* 08/22/2015    Recent Labs Lab 08/22/15 0241 08/23/15 0139  NA 143  --   K 3.5  --   CL 113*  --   CO2 24  --   BUN 33*  --   CREATININE 2.03* 3.13*  CALCIUM 7.2*  --   PROT 6.5  --   BILITOT 1.3*  --   ALKPHOS 71  --   ALT 1381*  --   AST >2275*  --   GLUCOSE 137*  --    Lab Results  Component Value Date   CKTOTAL 16109* 08/22/2015   CKMB 4.5* 08/24/2011   TROPONINI 1.79* 08/22/2015      Radiology: CXR-no acute cardiopulmonary abnormalities; Head ct normal EKG: nsr with no ischemmia  ASSESSMENT AND PLAN:  50 yo male with history of schizophrenia, seizure disorder, etohabuse found unresponive. Intubated and found to have acute renal and liver disease and evidience of shabdo. Serum troponin was mildly elevated to 1.56. Echo showed ef of 50-55% with no wall motion abnormality. Had cardiac cath at [facility] in 2012 with normal coronaries. Elevated troponin appears to be secondary to rhabdo, renal insuffiency. Does not appear to be ischemic. Would ocntinue to treat rhabdo, renal insuffiency and respiratory failure. Does not appear to be having an acute coronary event. No further cardiac workup indicated at present.  Signed: Dalia Heading MD, [facility] 08/23/2015, 8:39 AM

## 2015-08-23 NOTE — Consult Note (Signed)
CC: altered mental status   HPI: [name] is an 49 y.o. male with a known history of alcohol abuse, schizophrenia, seizure disorder, coronary artery disease status post MI in 2012 resents by EMS after being found unresponsive in his apartment.  Pt found to be septic with multi organ failure.  Pt is s/p intubation.    Past Medical History  Diagnosis Date  . Sleep apnea     had surgery to correct it  . Mental disorder     PTSD  . Seizures (HCC)     currently dx with seizures  . Cardiac arrest (HCC) 08/23/2011  . Pneumonia 09/04/2011  . Pancreatitis   . SOB (shortness of breath) 09/11/2011  . Hypertension   . Anxiety   . Myocardial infarction (HCC)   . Schizophrenia (HCC)   . Rhabdomyolysis 08/23/2015  . Acute renal failure (HCC) 08/23/2015    Past Surgical History  Procedure Laterality Date  . Tonsillectomy    . Uvulopalatopharyngoplasty, tonsillectomy and septoplasty  06/16/2000  . Left heart catheterization with coronary angiogram N/A 08/24/2011    Procedure: LEFT HEART CATHETERIZATION WITH CORONARY ANGIOGRAM;  Surgeon: Corky CraftsJayadeep S Varanasi, MD;  [facility];  Service: Cardiovascular;  Laterality: N/A;    Family History  Problem Relation Age of Onset  . Heart attack Mother     Social History:  reports that he has quit smoking. His smoking use included Cigarettes. He has never used smokeless tobacco. He reports that he drinks alcohol. He reports that he does not use illicit drugs.  Allergies  Allergen Reactions  . Antihistamines, Diphenhydramine-Type Anaphylaxis and Rash  . Benadryl [Diphenhydramine Hcl] Anaphylaxis and Rash  . Hydroxyzine Anaphylaxis and Rash    Medications: I have reviewed the patient's current medications.  ROS: Unable to obtain   Physical Examination: Blood pressure 107/78, pulse 90, temperature 98.8 F (37.1 C), temperature source Other (Comment), resp. rate 9, height 5\' 11"  (1.803 m), weight 218 lb 5 oz (99.026 kg), SpO2 100  %.  EOM intact tracks through the room Withdraws from painful stimuli bilaterally  Responds to visual threats   Laboratory Studies:   Basic Metabolic Panel:  Recent Labs Lab 08/21/15 1247 08/22/15 0241 08/23/15 0139  NA 145 143  --   K 3.7 3.5  --   CL 104 113*  --   CO2 19* 24  --   GLUCOSE 90 137*  --   BUN 38* 33*  --   CREATININE 2.91* 2.03* 3.13*  CALCIUM 9.1 7.2*  --   MG 2.7*  --   --     Liver Function Tests:  Recent Labs Lab 08/21/15 1247 08/22/15 0241  AST >2275* >2275*  ALT 598* 1381*  ALKPHOS 92 71  BILITOT 2.0* 1.3*  PROT 7.9 6.5  ALBUMIN 4.1 3.4*    Recent Labs Lab 08/21/15 1247  LIPASE 18   No results for input(s): AMMONIA in the last 168 hours.  CBC:  Recent Labs Lab 08/21/15 1247 08/22/15 0241 08/23/15 0139  WBC 5.8 5.2 5.7  NEUTROABS 4.7  --  4.9  HGB 15.9 14.3 13.6  HCT 47.2 44.0 41.8  MCV 90.9 90.4 91.7  PLT PLATELET CLUMPS NOTED ON SMEAR, COUNT APPEARS DECREASED 56* 51*    Cardiac Enzymes:  Recent Labs Lab 08/21/15 1247 08/21/15 2031 08/22/15 0240 08/22/15 0904  CKTOTAL [id number]*  --   --  47318*  TROPONINI 1.56* 1.73* 1.79*  --     BNP: Invalid input(s): POCBNP  CBG:  Recent Labs Lab 08/21/15 2304 08/22/15 0747 08/22/15 1113 08/22/15 1557 08/22/15 2116  GLUCAP 188* 134* 116* 87 83    Microbiology: Results for orders placed or performed during the hospital encounter of 08/21/15  Blood Culture (routine x 2)     Status: None (Preliminary result)   Collection Time: 08/21/15 12:47 PM  Result Value Ref Range Status   Specimen Description BLOOD UNKNOWN  Final   Special Requests BOTTLES DRAWN AEROBIC AND ANAEROBIC  1CC  Final   Culture NO GROWTH 2 DAYS  Final   Report Status PENDING  Incomplete  Blood Culture (routine x 2)     Status: None (Preliminary result)   Collection Time: 08/21/15 12:47 PM  Result Value Ref Range Status   Specimen Description BLOOD LEFT AC  Final   Special Requests BOTTLES DRAWN  AEROBIC AND ANAEROBIC  6CC  Final   Culture NO GROWTH 2 DAYS  Final   Report Status PENDING  Incomplete  Urine culture     Status: None (Preliminary result)   Collection Time: 08/21/15 12:47 PM  Result Value Ref Range Status   Specimen Description URINE, CATHETERIZED  Final   Special Requests Normal  Final   Culture NO GROWTH 1 DAY  Final   Report Status PENDING  Incomplete  MRSA PCR Screening     Status: None   Collection Time: 08/21/15 11:47 PM  Result Value Ref Range Status   MRSA by PCR NEGATIVE NEGATIVE Final    Comment:        The GeneXpert MRSA Assay (FDA approved for NASAL specimens only), is one component of a comprehensive MRSA colonization surveillance program. It is not intended to diagnose MRSA infection nor to guide or monitor treatment for MRSA infections.   C difficile quick scan w PCR reflex     Status: Abnormal   Collection Time: 08/22/15  2:31 PM  Result Value Ref Range Status   C Diff antigen POSITIVE (A) NEGATIVE Final   C Diff toxin POSITIVE (A) NEGATIVE Final   C Diff interpretation   Final    Positive for toxigenic C. difficile, active toxin production present.    Comment: CRITICAL RESULT CALLED TO, READ BACK BY AND VERIFIED WITH: CHARLIE FLEETWOOD,RN 08/22/2015 1544 BY J RAZZAKSUAREZ,MT     Coagulation Studies:  Recent Labs  08/21/15 1247  LABPROT 14.3  INR 1.09    Urinalysis:  Recent Labs Lab 08/21/15 1247 08/22/15 1347  COLORURINE AMBER* AMBER*  LABSPEC 1.024 1.024  PHURINE 5.0 5.0  GLUCOSEU NEGATIVE 50*  HGBUR 3+* 3+*  BILIRUBINUR NEGATIVE NEGATIVE  KETONESUR 1+* NEGATIVE  PROTEINUR 100* 100*  NITRITE NEGATIVE NEGATIVE  LEUKOCYTESUR NEGATIVE NEGATIVE    Lipid Panel:     Component Value Date/Time   CHOL 259* 09/05/2011 0535   TRIG 131 09/05/2011 0535   HDL 50 09/05/2011 0535   CHOLHDL 5.2 09/05/2011 0535   VLDL 26 09/05/2011 0535   LDLCALC 183* 09/05/2011 0535    HgbA1C:  Lab Results  Component Value Date   HGBA1C  5.2 08/21/2015    Urine Drug Screen:     Component Value Date/Time   LABOPIA NONE DETECTED 08/21/2015 1247   LABOPIA NONE DETECTED 03/27/2015 2206   COCAINSCRNUR NONE DETECTED 03/27/2015 2206   LABBENZ POSITIVE* 08/21/2015 1247   LABBENZ NONE DETECTED 03/27/2015 2206   AMPHETMU NONE DETECTED 08/21/2015 1247   AMPHETMU NONE DETECTED 03/27/2015 2206   THCU POSITIVE* 08/21/2015 1247   THCU NONE DETECTED 03/27/2015 2206  LABBARB NONE DETECTED 08/21/2015 1247   LABBARB NONE DETECTED 03/27/2015 2206    Alcohol Level:  Recent Labs Lab 08/21/15 1414  ETH <5     Imaging: Ct Head Wo Contrast  08/21/2015  CLINICAL DATA:  Refractory seizures EXAM: CT HEAD WITHOUT CONTRAST TECHNIQUE: Contiguous axial images were obtained from the base of the skull through the vertex without intravenous contrast. COMPARISON:  Head CT August 24, 2011; brain MRI August 26, 2011 FINDINGS: The ventricles are normal in size and configuration. There is no intracranial mass, hemorrhage, extra-axial fluid collection, or midline shift. By CT, the gray-white compartments appear normal. The increased T2 signal in the periventricular white matter noted on prior MR is not appreciable as abnormality on this noncontrast enhanced CT. No acute infarct in particular is evident on this study. The bony calvarium appears intact. The mastoid air cells are clear. IMPRESSION: Study within normal limits. Electronically Signed   By: Bretta Bang III M.D.   On: 08/21/2015 13:33   Dg Chest Portable 1 View  08/21/2015  CLINICAL DATA:  Status post intubation.  Seizure. EXAM: PORTABLE CHEST 1 VIEW COMPARISON:  Earlier today FINDINGS: Satisfactory position of the ET tube with tip above the carina. Heart size appears normal. No pleural effusion or edema. No airspace consolidation. IMPRESSION: 1. No complications after E ET tube placement. The tip is in satisfactory position above the carina. Electronically Signed   By: Signa Kell M.D.    On: 08/21/2015 15:20   Dg Chest Port 1 View  08/21/2015  CLINICAL DATA:  Unresponsive.  Seizure activity. EXAM: PORTABLE CHEST 1 VIEW COMPARISON:  09/11/2011 FINDINGS: The heart size is normal. There is no pleural effusion or edema. No airspace consolidation identified. The visualized bony structures appear intact. IMPRESSION: 1. No acute cardiopulmonary abnormalities. Electronically Signed   By: Signa Kell M.D.   On: 08/21/2015 13:51   Dg Abd Portable 1v  08/21/2015  CLINICAL DATA:  Bedside orogastric tube placement. Patient found unresponsive by a maintenance person at his apartment complex. Multiple witnessed seizures during the ambulance tried to the hospital. EXAM: PORTABLE ABDOMEN - 1 VIEW COMPARISON:  CT abdomen and pelvis 09/03/2011. FINDINGS: Orogastric tube tip barely in the fundus of the stomach and could be advanced. Nonobstructive bowel gas pattern. Gas within normal caliber colon from cecum to rectum. Gas within normal caliber small bowel loops throughout the abdomen. An opaque foreign body (ring) projects over the right upper pelvis. Foley catheter noted within the urinary bladder. Phleboliths low in both sides of the pelvis. Degenerative changes involving the lower lumbar spine. IMPRESSION: 1. Orogastric tube tip just into the fundus of the stomach. This could be further advanced. 2. No acute abdominal abnormality. 3. Opaque foreign body (ring) projects over the right upper pelvis. If this is not in the patient's pocket, it may be in a loop of distal small bowel. Electronically Signed   By: Hulan Saas M.D.   On: 08/21/2015 16:04   US Abdomen Limited Ruq  08/21/2015  CLINICAL DATA:  Elevated liver function tests. EXAM: US ABDOMEN LIMITED - RIGHT UPPER QUADRANT COMPARISON:  09/03/2011 FINDINGS: Gallbladder: No gallstones or wall thickening visualized. No sonographic Murphy sign noted. Common bile duct: Diameter: 3.2 mm Liver: Liver shows increased echogenicity and coarsened  echotexture. No mass or focal lesion. Hepatopetal flow documented in the portal vein. IMPRESSION: 1. No acute finding.  Normal gallbladder.  No bile duct dilation. 2. Increased echogenicity of the liver consistent with fatty infiltration. This is  similar to the prior ultrasound. Electronically Signed   By: Amie Portland M.D.   On: 08/21/2015 21:59     Assessment/Plan: 49 y.o. male with a known history of alcohol abuse, schizophrenia, seizure disorder, coronary artery disease status post MI in 2012 resents by EMS after being found unresponsive in his apartment.  Pt found to be septic with multi organ failure.  Pt is s/p intubation.    - Currently versed and fentanyl are held and he is starting to follow minimal commands - there was ? sz activity on initial evaluation, but his mental status appears to have improved slightly - due to improving mental status, poor renal function and as well as liver function would hold off anti epileptics today.  - EEG ordered for tomorrow AM - If mental status does not continue to improve would obtain CTH in AM  08/23/2015, 9:31 AM

## 2015-08-23 NOTE — Progress Notes (Signed)
Dr. Belia HemanKasa present and aware that patient is following commands but sleepy with low RR. Dr. Belia HemanKasa gave verbal order to resedate patient and that he will place a central line and vascath in patient.

## 2015-08-23 NOTE — Procedures (Signed)
Central Venous Dailysis Catheter Placement: Indication: Hemo Dialysis/CRRT   Consent:emergent  Risks and benefits explained in detail including risk of infection, bleeding, respiratory failure and death..   Hand washing performed prior to starting the procedure.   Procedure: An active timeout was performed and correct patient, name, & ID confirmed.  After explaining risk and benefits, patient was positioned correctly for central venous access. Patient was prepped using strict sterile technique including chlorohexadine preps, sterile drape, sterile gown and sterile gloves.  The area was prepped, draped and anesthetized in the usual sterile manner. Patient comfort was obtained.  A triple lumen catheter was placed in RT femoral Vein There was good blood return, catheter caps were placed on lumens, catheter flushed easily, the line was secured and a sterile dressing and BIO-PATCH applied.   Ultrasound was used to visualize vasculature and guidance of needle.   Number of Attempts: 1 Complications:none  Estimated Blood Loss: none Chest Radiograph indicated and ordered.  Operator: Weslie Pretlow.   Lucie LeatherKurian David Oviya Ammar, M.D.  [facility]  Medical Director [facility] Medical Director [facility] Card[facility]

## 2015-08-23 NOTE — Progress Notes (Signed)
Pt. Placed on 8/5 wean for 30 min. ABG's drawn 7.35, 41, 124, 22.6 on .35, Pt. Remained on wean until 10:30. Dr[facility] done

## 2015-08-23 NOTE — Progress Notes (Signed)
Subjective:  Patient remains critically ill and ventilator dependent Serum creatinine is worse Minimal response to IV Lasix that was given this morning. Urine output last 24 hours 580 cc CK is and serum creatinine are worsening    Objective:  Vital signs in last 24 hours:  Temp:  [98.8 F (37.1 C)-101.3 F (38.5 C)] 99.1 F (37.3 C) (11/06 1000) Pulse Rate:  [87-107] 89 (11/06 1000) Resp:  [9-17] 10 (11/06 1000) BP: (99-122)/(70-90) 110/78 mmHg (11/06 1000) SpO2:  [97 %-100 %] 100 % (11/06 1000) FiO2 (%):  [35 %] 35 % (11/06 0833)  Weight change:  Filed Weights   08/21/15 1240  Weight: 99.026 kg (218 lb 5 oz)    Intake/Output:    Intake/Output Summary (Last 24 hours) at 08/23/15 1011 Last data filed at 08/23/15 0900  Gross per 24 hour  Intake 4541.88 ml  Output    632 ml  Net 3909.88 ml     Physical Exam: General:  critically ill-appearing, no acute distress  HEENT  eyes open, appears to be looking around   Neck  supple  Pulm/lungs  ventilator dependent, FiO2 35%  CVS/Heart  tachycardic, regular  Abdomen:   soft, doughy consistency, nontender  Extremities:  no significant peripheral edema  Neurologic:  eyes open, does not follow commands, mild sedation  Skin:  no acute rashes  Access:     Foley present    Basic Metabolic Panel:   Recent Labs Lab 08/21/15 1247 08/22/15 0241 08/23/15 0139  NA 145 143  --   K 3.7 3.5  --   CL 104 113*  --   CO2 19* 24  --   GLUCOSE 90 137*  --   BUN 38* 33*  --   CREATININE 2.91* 2.03* 3.13*  CALCIUM 9.1 7.2*  --   MG 2.7*  --   --      CBC:  Recent Labs Lab 08/21/15 1247 08/22/15 0241 08/23/15 0139  WBC 5.8 5.2 5.7  NEUTROABS 4.7  --  4.9  HGB 15.9 14.3 13.6  HCT 47.2 44.0 41.8  MCV 90.9 90.4 91.7  PLT PLATELET CLUMPS NOTED ON SMEAR, COUNT APPEARS DECREASED 56* 51*      Microbiology:  Recent Results (from the past 720 hour(s))  Blood Culture (routine x 2)     Status: None (Preliminary result)    Collection Time: 08/21/15 12:47 PM  Result Value Ref Range Status   Specimen Description BLOOD UNKNOWN  Final   Special Requests BOTTLES DRAWN AEROBIC AND ANAEROBIC  1CC  Final   Culture NO GROWTH 2 DAYS  Final   Report Status PENDING  Incomplete  Blood Culture (routine x 2)     Status: None (Preliminary result)   Collection Time: 08/21/15 12:47 PM  Result Value Ref Range Status   Specimen Description BLOOD LEFT AC  Final   Special Requests BOTTLES DRAWN AEROBIC AND ANAEROBIC  6CC  Final   Culture NO GROWTH 2 DAYS  Final   Report Status PENDING  Incomplete  Urine culture     Status: None (Preliminary result)   Collection Time: 08/21/15 12:47 PM  Result Value Ref Range Status   Specimen Description URINE, CATHETERIZED  Final   Special Requests Normal  Final   Culture NO GROWTH 1 DAY  Final   Report Status PENDING  Incomplete  MRSA PCR Screening     Status: None   Collection Time: 08/21/15 11:47 PM  Result Value Ref Range Status   MRSA by  PCR NEGATIVE NEGATIVE Final    Comment:        The GeneXpert MRSA Assay (FDA approved for NASAL specimens only), is one component of a comprehensive MRSA colonization surveillance program. It is not intended to diagnose MRSA infection nor to guide or monitor treatment for MRSA infections.   C difficile quick scan w PCR reflex     Status: Abnormal   Collection Time: 08/22/15  2:31 PM  Result Value Ref Range Status   C Diff antigen POSITIVE (A) NEGATIVE Final   C Diff toxin POSITIVE (A) NEGATIVE Final   C Diff interpretation   Final    Positive for toxigenic C. difficile, active toxin production present.    Comment: CRITICAL RESULT CALLED TO, READ BACK BY AND VERIFIED WITH: CHARLIE FLEETWOOD,RN 08/22/2015 1544 BY J RAZZAKSUAREZ,MT     Coagulation Studies:  Recent Labs  08/21/15 1247  LABPROT 14.3  INR 1.09    Urinalysis:  Recent Labs  08/21/15 1247 08/22/15 1347  COLORURINE AMBER* AMBER*  LABSPEC 1.024 1.024  PHURINE 5.0  5.0  GLUCOSEU NEGATIVE 50*  HGBUR 3+* 3+*  BILIRUBINUR NEGATIVE NEGATIVE  KETONESUR 1+* NEGATIVE  PROTEINUR 100* 100*  NITRITE NEGATIVE NEGATIVE  LEUKOCYTESUR NEGATIVE NEGATIVE      Imaging: Ct Head Wo Contrast  08/21/2015  CLINICAL DATA:  Refractory seizures EXAM: CT HEAD WITHOUT CONTRAST TECHNIQUE: Contiguous axial images were obtained from the base of the skull through the vertex without intravenous contrast. COMPARISON:  Head CT August 24, 2011; brain MRI August 26, 2011 FINDINGS: The ventricles are normal in size and configuration. There is no intracranial mass, hemorrhage, extra-axial fluid collection, or midline shift. By CT, the gray-white compartments appear normal. The increased T2 signal in the periventricular white matter noted on prior MR is not appreciable as abnormality on this noncontrast enhanced CT. No acute infarct in particular is evident on this study. The bony calvarium appears intact. The mastoid air cells are clear. IMPRESSION: Study within normal limits. Electronically Signed   By: Bretta Bang III M.D.   On: 08/21/2015 13:33   Dg Chest Portable 1 View  08/21/2015  CLINICAL DATA:  Status post intubation.  Seizure. EXAM: PORTABLE CHEST 1 VIEW COMPARISON:  Earlier today FINDINGS: Satisfactory position of the ET tube with tip above the carina. Heart size appears normal. No pleural effusion or edema. No airspace consolidation. IMPRESSION: 1. No complications after E ET tube placement. The tip is in satisfactory position above the carina. Electronically Signed   By: Signa Kell M.D.   On: 08/21/2015 15:20   Dg Chest Port 1 View  08/21/2015  CLINICAL DATA:  Unresponsive.  Seizure activity. EXAM: PORTABLE CHEST 1 VIEW COMPARISON:  09/11/2011 FINDINGS: The heart size is normal. There is no pleural effusion or edema. No airspace consolidation identified. The visualized bony structures appear intact. IMPRESSION: 1. No acute cardiopulmonary abnormalities. Electronically  Signed   By: Signa Kell M.D.   On: 08/21/2015 13:51   Dg Abd Portable 1v  08/21/2015  CLINICAL DATA:  Bedside orogastric tube placement. Patient found unresponsive by a maintenance person at his apartment complex. Multiple witnessed seizures during the ambulance tried to the hospital. EXAM: PORTABLE ABDOMEN - 1 VIEW COMPARISON:  CT abdomen and pelvis 09/03/2011. FINDINGS: Orogastric tube tip barely in the fundus of the stomach and could be advanced. Nonobstructive bowel gas pattern. Gas within normal caliber colon from cecum to rectum. Gas within normal caliber small bowel loops throughout the abdomen. An opaque foreign body (  ring) projects over the right upper pelvis. Foley catheter noted within the urinary bladder. Phleboliths low in both sides of the pelvis. Degenerative changes involving the lower lumbar spine. IMPRESSION: 1. Orogastric tube tip just into the fundus of the stomach. This could be further advanced. 2. No acute abdominal abnormality. 3. Opaque foreign body (ring) projects over the right upper pelvis. If this is not in the patient's pocket, it may be in a loop of distal small bowel. Electronically Signed   By: Hulan Saashomas  Lawrence M.D.   On: 08/21/2015 16:04   [place] Abdomen Limited Ruq  08/21/2015  CLINICAL DATA:  Elevated liver function tests. EXAM: US ABDOMEN LIMITED - RIGHT UPPER QUADRANT COMPARISON:  09/03/2011 FINDINGS: Gallbladder: No gallstones or wall thickening visualized. No sonographic Murphy sign noted. Common bile duct: Diameter: 3.2 mm Liver: Liver shows increased echogenicity and coarsened echotexture. No mass or focal lesion. Hepatopetal flow documented in the portal vein. IMPRESSION: 1. No acute finding.  Normal gallbladder.  No bile duct dilation. 2. Increased echogenicity of the liver consistent with fatty infiltration. This is similar to the prior ultrasound. Electronically Signed   By: Amie Portlandavid  Ormond M.D.   On: 08/21/2015 21:59     Medications:   . sodium chloride 150  mL/hr at 08/23/15 0528  . fentaNYL infusion INTRAVENOUS 200 mcg/hr (08/23/15 0940)  . midazolam (VERSED) infusion 4 mg/hr (08/23/15 0940)   . antiseptic oral rinse  7 mL Mouth Rinse QID  . chlorhexidine gluconate  15 mL Mouth Rinse BID  . insulin aspart  0-5 Units Subcutaneous QHS  . insulin aspart  0-9 Units Subcutaneous TID WC  . pantoprazole (PROTONIX) IV  40 mg Intravenous Q24H  . vancomycin  500 mg Oral 4 times per day   acetaminophen **OR** acetaminophen  Assessment/ Plan:  49 y.o. male With alcohol abuse, schizophrenia, seizure disorder, coronary disease/MI 2012 [facility] after being found unresponsive in his apartment. Urine toxicology screen is positive for benzodiazepines and cannabinoids. Stool positive for C. Difficile 1. Acute renal failure, likely secondary to ATN from concurrent illness, volume depletion, C. difficile, rhabdomyolysis Urine output is poor Serum creatinine is worsening CK level is worsening Poor response to IV Lasix Recommend dialysis.  Dr. Belia HemanKasa to place a Vas-Cath today We'll plan for dialysis later today  2. Acute rhabdomyolysis Continue to monitor CK levels daily Supplemental IV bicarbonate low-dose   3. Acute respiratory failure Ventilator assisted at this time    LOS: 2 Shae Hinnenkamp 11/6/201610:11 AM

## 2015-08-23 NOTE — Consult Note (Signed)
[facility] Consultation      Name: Ross [name] MRN: [id number] DOB: 02/14/1966    ADMISSION DATE:  08/21/2015   CHIEF COMPLAINT:    acute resp failure   HISTORY OF PRESENT ILLNESS  Patient was intubated, placed on full vent support. Currently on sedation, not on vasopressors Failed vent wean due to acidosis Obtain ABG this AM Creatinine worsening, on IVFs    SIGNIFICANT EVENTS   11/4 intubated 115 failed vent wean due to encephalopathy and acidosis  Review of Systems  Unable to perform ROS: critical illness      VITAL SIGNS    Temp:  [99.7 F (37.6 C)-101.3 F (38.5 C)] 99.7 F (37.6 C) (11/06 0700) Pulse Rate:  [88-107] 88 (11/06 0700) Resp:  [13-17] 16 (11/06 0700) BP: (99-124)/(70-95) 102/71 mmHg (11/06 0700) SpO2:  [97 %-100 %] 100 % (11/06 0700) FiO2 (%):  [35 %] 35 % (11/06 0508) HEMODYNAMICS:   VENTILATOR SETTINGS: Vent Mode:  [-] PRVC FiO2 (%):  [35 %] 35 % Set Rate:  [16 bmp] 16 bmp Vt Set:  [550 mL] 550 mL PEEP:  [5 cmH20] 5 cmH20 Plateau Pressure:  [20 cmH20] 20 cmH20 INTAKE / OUTPUT:  Intake/Output Summary (Last 24 hours) at 08/23/15 0815 Last data filed at 08/23/15 0700  Gross per 24 hour  Intake 4301.88 ml  Output    582 ml  Net 3719.88 ml       PHYSICAL EXAM   Physical Exam  Constitutional: No distress.  HENT:  Head: Normocephalic and atraumatic.  Eyes: Pupils are equal, round, and reactive to light. No scleral icterus.  Neck: Normal range of motion. Neck supple.  Cardiovascular: Normal rate and regular rhythm.   No murmur heard. Pulmonary/Chest: No respiratory distress. He has no wheezes. He has rales.  resp distress  Abdominal: Soft. He exhibits no distension. There is no tenderness.  Musculoskeletal: He exhibits no edema.  Neurological:  gcs<8T  Skin: Skin is warm. He is diaphoretic.       LABS   LABS:  CBC  Recent Labs Lab 08/21/15 1247 08/22/15 0241  WBC 5.8 5.2  HGB  15.9 14.3  HCT 47.2 44.0  PLT PLATELET CLUMPS NOTED ON SMEAR, COUNT APPEARS DECREASED 56*   Coag's  Recent Labs Lab 08/21/15 1247  APTT 28  INR 1.09   BMET  Recent Labs Lab 08/21/15 1247 08/22/15 0241 08/23/15 0139  NA 145 143  --   K 3.7 3.5  --   CL 104 113*  --   CO2 19* 24  --   BUN 38* 33*  --   CREATININE 2.91* 2.03* 3.13*  GLUCOSE 90 137*  --    Electrolytes  Recent Labs Lab 08/21/15 1247 08/22/15 0241  CALCIUM 9.1 7.2*  MG 2.7*  --    Sepsis Markers  Recent Labs Lab 08/22/15 0022 08/22/15 0240 08/22/15 0550  LATICACIDVEN 2.1* 2.1* 2.0   ABG  Recent Labs Lab 08/21/15 1255 08/21/15 2002 08/22/15 1010  PHART 7.47* 7.33* 7.34*  PCO2ART 25* 41 44  PO2ART 56* 142* 103   Liver Enzymes  Recent Labs Lab 08/21/15 1247 08/22/15 0241  AST >2275* >2275*  ALT 598* 1381*  ALKPHOS 92 71  BILITOT 2.0* 1.3*  ALBUMIN 4.1 3.4*   Cardiac Enzymes  Recent Labs Lab 08/21/15 1247 08/21/15 2031 08/22/15 0240  TROPONINI 1.56* 1.73* 1.79*   Glucose  Recent Labs Lab 08/21/15 1914 08/21/15 2304 08/22/15 0747 08/22/15 1113 08/22/15 1557 08/22/15 2116  GLUCAP 166* 188* 134* 116* 87 83     Recent Results (from the past 240 hour(s))  Blood Culture (routine x 2)     Status: None (Preliminary result)   Collection Time: 08/21/15 12:47 PM  Result Value Ref Range Status   Specimen Description BLOOD UNKNOWN  Final   Special Requests BOTTLES DRAWN AEROBIC AND ANAEROBIC  1CC  Final   Culture NO GROWTH 2 DAYS  Final   Report Status PENDING  Incomplete  Blood Culture (routine x 2)     Status: None (Preliminary result)   Collection Time: 08/21/15 12:47 PM  Result Value Ref Range Status   Specimen Description BLOOD LEFT AC  Final   Special Requests BOTTLES DRAWN AEROBIC AND ANAEROBIC  6CC  Final   Culture NO GROWTH 2 DAYS  Final   Report Status PENDING  Incomplete  Urine culture     Status: None (Preliminary result)   Collection Time: 08/21/15  12:47 PM  Result Value Ref Range Status   Specimen Description URINE, CATHETERIZED  Final   Special Requests Normal  Final   Culture NO GROWTH 1 DAY  Final   Report Status PENDING  Incomplete  MRSA PCR Screening     Status: None   Collection Time: 08/21/15 11:47 PM  Result Value Ref Range Status   MRSA by PCR NEGATIVE NEGATIVE Final    Comment:        The GeneXpert MRSA Assay (FDA approved for NASAL specimens only), is one component of a comprehensive MRSA colonization surveillance program. It is not intended to diagnose MRSA infection nor to guide or monitor treatment for MRSA infections.   C difficile quick scan w PCR reflex     Status: Abnormal   Collection Time: 08/22/15  2:31 PM  Result Value Ref Range Status   C Diff antigen POSITIVE (A) NEGATIVE Final   C Diff toxin POSITIVE (A) NEGATIVE Final   C Diff interpretation   Final    Positive for toxigenic C. difficile, active toxin production present.    Comment: CRITICAL RESULT CALLED TO, READ BACK BY AND VERIFIED WITH: CHARLIE FLEETWOOD,RN 08/22/2015 1544 BY J RAZZAKSUAREZ,MT      Current facility-administered medications:  .  0.9 %  sodium chloride infusion, , Intravenous, Continuous, Flora Lipps, MD, Last Rate: 150 mL/hr at 08/23/15 0528 .  acetaminophen (TYLENOL) tablet 650 mg, 650 mg, Oral, Q6H PRN, 650 mg at 08/23/15 0530 **OR** acetaminophen (TYLENOL) suppository 650 mg, 650 mg, Rectal, Q6H PRN, Aldean Jewett, MD .  antiseptic oral rinse solution (CORINZ), 7 mL, Mouth Rinse, QID, Aldean Jewett, MD, 7 mL at 08/23/15 0500 .  chlorhexidine gluconate (PERIDEX) 0.12 % solution 15 mL, 15 mL, Mouth Rinse, BID, Aldean Jewett, MD, 15 mL at 08/22/15 2018 .  fentaNYL 2534mcg in NS 239mL (87mcg/ml) infusion-PREMIX, 100 mcg/hr, Intravenous, Continuous, Hinda Kehr, MD, Last Rate: 20 mL/hr at 08/23/15 0459, 200 mcg/hr at 08/23/15 0459 .  insulin aspart (novoLOG) injection 0-5 Units, 0-5 Units, Subcutaneous, QHS,  Aldean Jewett, MD, 0 Units at 08/21/15 2318 .  insulin aspart (novoLOG) injection 0-9 Units, 0-9 Units, Subcutaneous, TID WC, Aldean Jewett, MD, 1 Units at 08/22/15 [id number] .  midazolam (VERSED) 50 mg in sodium chloride 0.9 % 50 mL (1 mg/mL) infusion, 1 mg/hr, Intravenous, Continuous, Hinda Kehr, MD, Last Rate: 4 mL/hr at 08/22/15 2335, 4 mg/hr at 08/22/15 2335 .  pantoprazole (PROTONIX) injection 40 mg, 40 mg, Intravenous, Q24H, Anders Simmonds, MD, 40  mg at 08/22/15 2018 .  piperacillin-tazobactam (ZOSYN) IVPB 3.375 g, 3.375 g, Intravenous, 3 times per day, Hinda Kehr, MD, 3.375 g at 08/23/15 0537 .  vancomycin (VANCOCIN) 1,250 mg in sodium chloride 0.9 % 250 mL IVPB, 1,250 mg, Intravenous, Q18H, Aldean Jewett, MD, 1,250 mg at 08/22/15 1647 .  vancomycin (VANCOCIN) 50 mg/mL oral solution 500 mg, 500 mg, Oral, 4 times per day, Aldean Jewett, MD, 500 mg at 08/23/15 0530  IMAGING    No results found.    Indwelling Urinary Catheter continued, requirement due to   Reason to continue Indwelling Urinary Catheter for strict Intake/Output monitoring for hemodynamic instability         Ventilator continued, requirement due to, resp failure    Ventilator Sedation RASS 0 to -2   MAJOR EVENTS/TEST RESULTS: 11/4 intubated 11/5 failed vent wean   INDWELLING DEVICES::  MICRO DATA: MRSA PCR >>neg Urine  Blood Resp  FLU H1N1>> NEG c diff + 11/5  ANTIMICROBIALS:  Oral vanc>>11/5 Flagy>>11/5    ASSESSMENT/PLAN   49 yo AAM with acute resp failure with acute encephalopathy from acute sepsis from acute recurrent c diff colitis With acute renal failure from rhabdomyolysis with acute drug abuse with etoh abuse  PULMONARY-failed vent wean due to encephalopathy and acidosis -Respiratory Failure -continue Full MV support -continue Bronchodilator Therapy -Wean Fio2 and PEEP as tolerated -will perform SAT/SBT when respiratory parameters are met  CARDIOVASCULAR -blood  pressure stable -not on vasopressors -IVF's  RENAL-Rhabdomyolysis Acute renal failure from ATN -follow UO, follow chem 7 -aggressive IVF's -follow up Nephro consult recs   GASTROINTESTINAL GI prophylaxis  HEMATOLOGIC Decreased PLT count -stop heparin infusion  INFECTIOUS + cdiff -oral vancomycin and flagyl  ENDOCRINE - ICU hypoglycemic\Hyperglycemia protocol   NEUROLOGIC - intubated and sedated - minimal sedation to achieve a RASS goal: -1  TTP less likely at this time: continue current care and management  I have personally obtained a history, examined the patient, evaluated laboratory and independently reviewed  imaging results, formulated the assessment and plan and placed orders.  The Patient requires high complexity decision making for assessment and support, frequent evaluation and titration of therapies, application of advanced monitoring technologies and extensive interpretation of multiple databases. Critical Care Time devoted to patient care services described in this note is 45 minutes.   Overall, patient is critically ill, prognosis is guarded. Patient at high risk for cardiac arrest and death.    Corrin Parker, M.D.  [facility]  Medical Director [place] Director [facility] Cardio-Pulmonary Department

## 2015-08-23 NOTE — Progress Notes (Signed)
[facility] - [place] at [facility]   PATIENT NAME: Ross [name]    MR#:  [id number]  DATE OF BIRTH:  02-Apr-1966  SUBJECTIVE:  CHIEF COMPLAINT:   Chief Complaint  Patient presents with  . Seizures   Intubated. Sedated.  REVIEW OF SYSTEMS:   ROS unable to obtain as the patient is intubated and sedated  DRUG ALLERGIES:   Allergies  Allergen Reactions  . Antihistamines, Diphenhydramine-Type Anaphylaxis and Rash  . Benadryl [Diphenhydramine Hcl] Anaphylaxis and Rash  . Hydroxyzine Anaphylaxis and Rash    VITALS:  Blood pressure 107/72, pulse 88, temperature 98.6 F (37 C), temperature source Other (Comment), resp. rate 15, height  (1.803 m), weight 99.026 kg (218 lb 5 oz), SpO2 100 %.  PHYSICAL EXAMINATION:  GENERAL:  49 y.o.-year-old patient lying in the bed, medically ill appearing, diaphoretic, intubated EYES: Pupils equal, round, reactive to light and accommodation. No scleral icterus. Extraocular muscles intact.  HEENT: Head atraumatic, normocephalic. Oropharynx and nasopharynx clear.  NECK:  Supple, no jugular venous distention. No thyroid enlargement, no tenderness.  LUNGS: Normal breath sounds bilaterally, no wheezing, rales,rhonchi or crepitation. No use of accessory muscles of respiration.  CARDIOVASCULAR: S1, S2 normal. No murmurs, rubs, or gallops.  ABDOMEN: Soft, nontender, nondistended. Bowel sounds present. No organomegaly or mass.  EXTREMITIES: No pedal edema, cyanosis, or clubbing. Peripheral pulses 2+ NEUROLOGIC: Intubated and sedated . Has been following commands. Moves all 4 extremities SKIN: No obvious rash, lesion, or ulcer.    LABORATORY PANEL:   CBC  Recent Labs Lab 08/23/15 0139  WBC 5.7  HGB 13.6  HCT 41.8  PLT 51*   ------------------------------------------------------------------------------------------------------------------  Chemistries   Recent Labs Lab 08/21/15 1247 08/22/15 0241  08/23/15 0139  NA 145 143  --   K 3.7 3.5  --   CL 104 113*  --   CO2 19* 24  --   GLUCOSE 90 137*  --   BUN 38* 33*  --   CREATININE 2.91* 2.03* 3.13*  CALCIUM 9.1 7.2*  --   MG 2.7*  --   --   AST >2275* >2275*  --   ALT 598* 1381*  --   ALKPHOS 92 71  --   BILITOT 2.0* 1.3*  --    ------------------------------------------------------------------------------------------------------------------  Cardiac Enzymes  Recent Labs Lab 08/22/15 0240  TROPONINI 1.79*   ------------------------------------------------------------------------------------------------------------------  RADIOLOGY:  Dg Chest 1 View  08/23/2015  CLINICAL DATA:  Central line placed on the right. EXAM: CHEST 1 VIEW COMPARISON:  08/21/2015. FINDINGS: Normal sized heart. Clear lungs. Endotracheal tube in satisfactory position. Nasogastric tube tip in the proximal stomach. Interval right jugular catheter with its tip in the upper right atrium or superior cavoatrial junction and. No pneumothorax. Unremarkable bones. IMPRESSION: 1. Right jugular catheter tip in the upper right atrium more superior cavoatrial junction without pneumothorax. 2. No acute abnormality. Electronically Signed   By: Beckie Salts M.D.   On: 08/23/2015 11:20   Dg Chest Portable 1 View  08/21/2015  CLINICAL DATA:  Status post intubation.  Seizure. EXAM: PORTABLE CHEST 1 VIEW COMPARISON:  Earlier today FINDINGS: Satisfactory position of the ET tube with tip above the carina. Heart size appears normal. No pleural effusion or edema. No airspace consolidation. IMPRESSION: 1. No complications after E ET tube placement. The tip is in satisfactory position above the carina. Electronically Signed   By: Signa Kell M.D.   On: 08/21/2015 15:20   Dg Abd Portable 1v  08/21/2015  CLINICAL DATA:  Bedside orogastric tube placement. Patient found unresponsive by a maintenance person at his apartment complex. Multiple witnessed seizures during the ambulance  tried to the hospital. EXAM: PORTABLE ABDOMEN - 1 VIEW COMPARISON:  CT abdomen and pelvis 09/03/2011. FINDINGS: Orogastric tube tip barely in the fundus of the stomach and could be advanced. Nonobstructive bowel gas pattern. Gas within normal caliber colon from cecum to rectum. Gas within normal caliber small bowel loops throughout the abdomen. An opaque foreign body (ring) projects over the right upper pelvis. Foley catheter noted within the urinary bladder. Phleboliths low in both sides of the pelvis. Degenerative changes involving the lower lumbar spine. IMPRESSION: 1. Orogastric tube tip just into the fundus of the stomach. This could be further advanced. 2. No acute abdominal abnormality. 3. Opaque foreign body (ring) projects over the right upper pelvis. If this is not in the patient's pocket, it may be in a loop of distal small bowel. Electronically Signed   By: Hulan Saashomas  Lawrence M.D.   On: 08/21/2015 16:04   [place] Abdomen Limited Ruq  08/21/2015  CLINICAL DATA:  Elevated liver function tests. EXAM: US ABDOMEN LIMITED - RIGHT UPPER QUADRANT COMPARISON:  09/03/2011 FINDINGS: Gallbladder: No gallstones or wall thickening visualized. No sonographic Murphy sign noted. Common bile duct: Diameter: 3.2 mm Liver: Liver shows increased echogenicity and coarsened echotexture. No mass or focal lesion. Hepatopetal flow documented in the portal vein. IMPRESSION: 1. No acute finding.  Normal gallbladder.  No bile duct dilation. 2. Increased echogenicity of the liver consistent with fatty infiltration. This is similar to the prior ultrasound. Electronically Signed   By: Amie Portlandavid  Ormond M.D.   On: 08/21/2015 21:59    EKG:   Orders placed or performed during the hospital encounter of 08/21/15  . EKG 12-Lead  . EKG 12-Lead    ASSESSMENT AND PLAN:   #1 fever/sepsis: Likely due to C. difficile colitis - Influenza negative, UA not convincing for infection, blood cultures and urine cultures are pending - HIV and  hepatitis panel is pending - 2-D echocardiogram is normal - Clostridium difficile positive on oral vancomycin  #2 acute respiratory failure with hypoxia and altered mental status - Pulmonology following, possible extubation today - repeat chest x-ray - Unable to obtain CT angiography due to renal failure. Heparin drip had to be stopped due to thrombocytopenia  #3 rhabdomyolysis - Possibly due to prolonged immobilization, hypotension, diarrhea - Medication effect also possible - Continue hydration  #4 acute renal failure: worse today. Temp HD catheter placed and will have treatment today. - Due to ATN, rhabdomyolysis - Appreciate nephrology following - Continue IV fluids and bicarbonate  #5 thrombocytopenia - Possibly due to sepsis in the setting of C. difficile infection - patient also with history of heavy ETOH use.  #6 elevated troponin - No concerning EKG changes, no events on telemetry. Troponin has been consistently elevated and actually trending upward slightly. - Heparin drip stopped due to thrombocytopenia - 2-D echocardiogram is normal with no focal wall motion abnormalities - Likely related to rhabdo - appreciate cardiology consultation  #7 altered mental status - Likely metabolic encephalopathy, we'll continue to follow  #8 acute hepatitis - Viral hepatitis panel negative - Some of the AST likely due to rhabdomyolysis - Likely shock liver  All the records are reviewed and case discussed with Care Management/Social Workerr. Management plans discussed with the patient, family and they are in agreement.  CODE STATUS: Full   TOTAL CRITICAL CARE TIME TAKING CARE OF  THIS PATIENT: 45 minutes.  Greater than 50% of time spent in care coordination and counseling. POSSIBLE D/C IN ? DAYS, DEPENDING ON CLINICAL CONDITION.   Elby Showers M.D on 08/23/2015 at 2:00 PM  Between 7am to 6pm - Pager - [phone]  After 6pm go to [email or web address] - password [email or web address]  [facility]  [facility]  Office  [phone]  CC: Primary care physician; No primary care provider on file.

## 2015-08-24 ENCOUNTER — Inpatient Hospital Stay: Payer: Medicare Other

## 2015-08-24 DIAGNOSIS — A047 Enterocolitis due to Clostridium difficile: Secondary | ICD-10-CM

## 2015-08-24 DIAGNOSIS — N179 Acute kidney failure, unspecified: Secondary | ICD-10-CM

## 2015-08-24 LAB — HEPATIC FUNCTION PANEL
ALT: 636 U/L — ABNORMAL HIGH (ref 17–63)
AST: 1274 U/L — ABNORMAL HIGH (ref 15–41)
Albumin: 2.4 g/dL — ABNORMAL LOW (ref 3.5–5.0)
Alkaline Phosphatase: 73 U/L (ref 38–126)
BILIRUBIN DIRECT: 0.5 mg/dL (ref 0.1–0.5)
BILIRUBIN INDIRECT: 0.9 mg/dL (ref 0.3–0.9)
BILIRUBIN TOTAL: 1.4 mg/dL — AB (ref 0.3–1.2)
Total Protein: 5.4 g/dL — ABNORMAL LOW (ref 6.5–8.1)

## 2015-08-24 LAB — RENAL FUNCTION PANEL
Albumin: 2.3 g/dL — ABNORMAL LOW (ref 3.5–5.0)
Anion gap: 9 (ref 5–15)
BUN: 45 mg/dL — AB (ref 6–20)
CALCIUM: 6.6 mg/dL — AB (ref 8.9–10.3)
CHLORIDE: 110 mmol/L (ref 101–111)
CO2: 24 mmol/L (ref 22–32)
CREATININE: 4.68 mg/dL — AB (ref 0.61–1.24)
GFR calc Af Amer: 16 mL/min — ABNORMAL LOW (ref >60)
GFR calc non Af Amer: 14 mL/min — ABNORMAL LOW (ref >60)
GLUCOSE: 95 mg/dL (ref 65–99)
Phosphorus: 3.3 mg/dL (ref 2.5–4.6)
Potassium: 3.7 mmol/L (ref 3.5–5.1)
SODIUM: 143 mmol/L (ref 135–145)

## 2015-08-24 LAB — URINALYSIS COMPLETE WITH MICROSCOPIC (ARMC ONLY)
Bilirubin Urine: NEGATIVE
GLUCOSE, UA: NEGATIVE mg/dL
LEUKOCYTES UA: NEGATIVE
Nitrite: NEGATIVE
PH: 5 (ref 5.0–8.0)
PROTEIN: 100 mg/dL — AB
Specific Gravity, Urine: 1.013 (ref 1.005–1.030)

## 2015-08-24 LAB — CBC
HCT: 34.3 % — ABNORMAL LOW (ref 40.0–52.0)
Hemoglobin: 11.4 g/dL — ABNORMAL LOW (ref 13.0–18.0)
MCH: 30.1 pg (ref 26.0–34.0)
MCHC: 33.3 g/dL (ref 32.0–36.0)
MCV: 90.1 fL (ref 80.0–100.0)
PLATELETS: 66 10*3/uL — AB (ref 150–440)
RBC: 3.8 MIL/uL — ABNORMAL LOW (ref 4.40–5.90)
RDW: 14.4 % (ref 11.5–14.5)
WBC: 8.5 10*3/uL (ref 3.8–10.6)

## 2015-08-24 LAB — GLUCOSE, CAPILLARY
GLUCOSE-CAPILLARY: 88 mg/dL (ref 65–99)
GLUCOSE-CAPILLARY: 73 mg/dL (ref 65–99)
Glucose-Capillary: 77 mg/dL (ref 65–99)
Glucose-Capillary: 70 mg/dL (ref 65–99)

## 2015-08-24 LAB — CK: CK TOTAL: 34501 U/L — AB (ref 49–397)

## 2015-08-24 LAB — HEPATITIS B CORE ANTIBODY, TOTAL: Hep B Core Total Ab: NEGATIVE

## 2015-08-24 LAB — HEPATITIS B SURFACE ANTIBODY,QUALITATIVE: HEP B S AB: NONREACTIVE

## 2015-08-24 MED ORDER — ACETAMINOPHEN 325 MG PO TABS
650.0000 mg | ORAL_TABLET | Freq: Four times a day (QID) | ORAL | Status: DC | PRN
  Administered 2015-08-27 – 2015-09-12 (×3): 650 mg via ORAL
  Filled 2015-08-24 (×3): qty 2

## 2015-08-24 MED ORDER — IPRATROPIUM-ALBUTEROL 0.5-2.5 (3) MG/3ML IN SOLN
3.0000 mL | Freq: Four times a day (QID) | RESPIRATORY_TRACT | Status: AC
  Administered 2015-08-24 – 2015-08-25 (×4): 3 mL via RESPIRATORY_TRACT
  Filled 2015-08-24 (×5): qty 3

## 2015-08-24 MED ORDER — ACETYLCYSTEINE 20 % IN SOLN
3.0000 mL | Freq: Four times a day (QID) | RESPIRATORY_TRACT | Status: DC
  Administered 2015-08-24 – 2015-08-25 (×3): 3 mL via RESPIRATORY_TRACT
  Filled 2015-08-24 (×3): qty 4

## 2015-08-24 MED ORDER — ALBUMIN HUMAN 25 % IV SOLN: 12.5000 g | Freq: Once | INTRAVENOUS | Status: DC

## 2015-08-24 MED ORDER — FAMOTIDINE 20 MG PO TABS
20.0000 mg | ORAL_TABLET | ORAL | Status: DC
  Administered 2015-08-24 – 2015-08-28 (×4): 20 mg via ORAL
  Filled 2015-08-24 (×4): qty 1

## 2015-08-24 MED ORDER — VANCOMYCIN 50 MG/ML ORAL SOLUTION
125.0000 mg | Freq: Four times a day (QID) | ORAL | Status: DC
  Administered 2015-08-24 – 2015-09-05 (×41): 125 mg via ORAL
  Filled 2015-08-24 (×58): qty 2.5

## 2015-08-24 MED ORDER — VITAL HIGH PROTEIN PO LIQD
1000.0000 mL | ORAL | Status: DC
  Administered 2015-08-24 – 2015-08-25 (×2): 1000 mL
  Administered 2015-08-25: 08:00:00

## 2015-08-24 MED ORDER — ACETAMINOPHEN 650 MG RE SUPP: 650.0000 mg | Freq: Four times a day (QID) | RECTAL | Status: DC | PRN

## 2015-08-24 MED ORDER — FREE WATER
100.0000 mL | Freq: Three times a day (TID) | Status: DC
  Administered 2015-08-24 – 2015-08-25 (×4): 100 mL

## 2015-08-24 NOTE — Consult Note (Signed)
[facility] Consultation      Name: Ross [name] MRN: [id number] DOB: 05/03/1966    ADMISSION DATE:  08/21/2015   CHIEF COMPLAINT:    acute resp failure   SUBJECTIVE  Patient was intubated, placed on full vent support. Currently on sedation, not on vasopressors.  Will follow simple commands (blink eyes, squeeze hands) Failed vent wean due to acidosis on 11/6, trying sbt today. Creatinine worsening, on IVFs, will get another session of HD today.     SIGNIFICANT EVENTS   11/4 intubated 11/5 failed vent wean due to encephalopathy and acidosis 11/6 RIJ CVL, R Fem Vein VasCath   Review of Systems  Unable to perform ROS: critical illness      VITAL SIGNS    Temp:  [98.6 F (37 C)-100.2 F (37.9 C)] 99.7 F (37.6 C) (11/07 0817) Pulse Rate:  [84-94] 89 (11/07 0817) Resp:  [10-17] 16 (11/07 0817) BP: (92-116)/(61-82) 99/73 mmHg (11/07 0817) SpO2:  [97 %-100 %] 98 % (11/07 0817) FiO2 (%):  [35 %] 35 % (11/07 0817) Weight:  [234 lb 2.1 oz (106.2 kg)] 234 lb 2.1 oz (106.2 kg) (11/06 1345) HEMODYNAMICS:   VENTILATOR SETTINGS: Vent Mode:  [-] PRVC FiO2 (%):  [35 %] 35 % Set Rate:  [16 bmp] 16 bmp Vt Set:  [550 mL] 550 mL PEEP:  [5 cmH20] 5 cmH20 Plateau Pressure:  [16 cmH20-19 cmH20] 16 cmH20 INTAKE / OUTPUT:  Intake/Output Summary (Last 24 hours) at 08/24/15 0909 Last data filed at 08/24/15 0800  Gross per 24 hour  Intake 4106.37 ml  Output    823 ml  Net 3283.37 ml       PHYSICAL EXAM   Physical Exam  Constitutional: No distress.  HENT:  Head: Normocephalic and atraumatic.  Eyes: Pupils are equal, round, and reactive to light. No scleral icterus.  Neck: Normal range of motion. Neck supple.  Cardiovascular: Normal rate and regular rhythm.   No murmur heard. Pulmonary/Chest: No respiratory distress. He has no wheezes. He has rales.  resp distress  Abdominal: Soft. He exhibits no distension. There is no tenderness.    Musculoskeletal: He exhibits no edema.  Neurological:  gcs<10T  Skin: Skin is warm. He is diaphoretic.       LABS   LABS:  CBC  Recent Labs Lab 08/21/15 1247 08/22/15 0241 08/23/15 0139  WBC 5.8 5.2 5.7  HGB 15.9 14.3 13.6  HCT 47.2 44.0 41.8  PLT PLATELET CLUMPS NOTED ON SMEAR, COUNT APPEARS DECREASED 56* 51*   Coag's  Recent Labs Lab 08/21/15 1247  APTT 28  INR 1.09   BMET  Recent Labs Lab 08/21/15 1247 08/22/15 0241 08/23/15 0139 08/23/15 1346  NA 145 143  --  144  K 3.7 3.5  --  4.2  CL 104 113*  --  107  CO2 19* 24  --  24  BUN 38* 33*  --  45*  CREATININE 2.91* 2.03* 3.13* 4.15*  GLUCOSE 90 137*  --  97   Electrolytes  Recent Labs Lab 08/21/15 1247 08/22/15 0241 08/23/15 1346  CALCIUM 9.1 7.2* 6.5*  MG 2.7*  --   --   PHOS  --   --  4.7*   Sepsis Markers  Recent Labs Lab 08/22/15 0022 08/22/15 0240 08/22/15 0550  LATICACIDVEN 2.1* 2.1* 2.0   ABG  Recent Labs Lab 08/21/15 2002 08/22/15 1010 08/23/15 0920  PHART 7.33* 7.34* 7.35  PCO2ART 41 44 41  PO2ART 142* 103 124*  Liver Enzymes  Recent Labs Lab 08/21/15 1247 08/22/15 0241 08/23/15 1346  AST >2275* >2275*  --   ALT 598* 1381*  --   ALKPHOS 92 71  --   BILITOT 2.0* 1.3*  --   ALBUMIN 4.1 3.4* 2.4*   Cardiac Enzymes  Recent Labs Lab 08/21/15 1247 08/21/15 2031 08/22/15 0240  TROPONINI 1.56* 1.73* 1.79*   Glucose  Recent Labs Lab 08/22/15 2116 08/23/15 0832 08/23/15 1145 08/23/15 1740 08/23/15 2143 08/24/15 0709  GLUCAP 83 82 97 93 72 88     Recent Results (from the past 240 hour(s))  Blood Culture (routine x 2)     Status: None (Preliminary result)   Collection Time: 08/21/15 12:47 PM  Result Value Ref Range Status   Specimen Description BLOOD UNKNOWN  Final   Special Requests BOTTLES DRAWN AEROBIC AND ANAEROBIC  1CC  Final   Culture NO GROWTH 2 DAYS  Final   Report Status PENDING  Incomplete  Blood Culture (routine x 2)     Status:  None (Preliminary result)   Collection Time: 08/21/15 12:47 PM  Result Value Ref Range Status   Specimen Description BLOOD LEFT AC  Final   Special Requests BOTTLES DRAWN AEROBIC AND ANAEROBIC  6CC  Final   Culture NO GROWTH 2 DAYS  Final   Report Status PENDING  Incomplete  Urine culture     Status: None   Collection Time: 08/21/15 12:47 PM  Result Value Ref Range Status   Specimen Description URINE, CATHETERIZED  Final   Special Requests Normal  Final   Culture NO GROWTH 2 DAYS  Final   Report Status 08/23/2015 FINAL  Final  MRSA PCR Screening     Status: None   Collection Time: 08/21/15 11:47 PM  Result Value Ref Range Status   MRSA by PCR NEGATIVE NEGATIVE Final    Comment:        The GeneXpert MRSA Assay (FDA approved for NASAL specimens only), is one component of a comprehensive MRSA colonization surveillance program. It is not intended to diagnose MRSA infection nor to guide or monitor treatment for MRSA infections.   C difficile quick scan w PCR reflex     Status: Abnormal   Collection Time: 08/22/15  2:31 PM  Result Value Ref Range Status   C Diff antigen POSITIVE (A) NEGATIVE Final   C Diff toxin POSITIVE (A) NEGATIVE Final   C Diff interpretation   Final    Positive for toxigenic C. difficile, active toxin production present.    Comment: CRITICAL RESULT CALLED TO, READ BACK BY AND VERIFIED WITH: CHARLIE FLEETWOOD,RN 08/22/2015 1544 BY J RAZZAKSUAREZ,MT      Current facility-administered medications:  .  0.9 %  sodium chloride infusion, , Intravenous, Continuous, Flora Lipps, MD, Last Rate: 150 mL/hr at 08/24/15 0823 .  acetaminophen (TYLENOL) tablet 650 mg, 650 mg, Oral, Q6H PRN, 650 mg at 08/23/15 0530 **OR** acetaminophen (TYLENOL) suppository 650 mg, 650 mg, Rectal, Q6H PRN, Aldean Jewett, MD .  antiseptic oral rinse solution (CORINZ), 7 mL, Mouth Rinse, QID, Aldean Jewett, MD, 7 mL at 08/24/15 0517 .  chlorhexidine gluconate (PERIDEX) 0.12 %  solution 15 mL, 15 mL, Mouth Rinse, BID, Aldean Jewett, MD, 15 mL at 08/24/15 0809 .  fentaNYL 2557mg in NS 2575m(1076mml) infusion-PREMIX, 100 mcg/hr, Intravenous, Continuous, CorHinda KehrD, Stopped at 08/24/15 0800 .  insulin aspart (novoLOG) injection 0-5 Units, 0-5 Units, Subcutaneous, QHS, CatAldean JewettD, 0 Units at  08/21/15 2318 .  insulin aspart (novoLOG) injection 0-9 Units, 0-9 Units, Subcutaneous, TID WC, Aldean Jewett, MD, 1 Units at 08/22/15 [phone] .  midazolam (VERSED) 50 mg in sodium chloride 0.9 % 50 mL (1 mg/mL) infusion, 1 mg/hr, Intravenous, Continuous, Hinda Kehr, MD, Stopped at 08/24/15 0800 .  pantoprazole (PROTONIX) injection 40 mg, 40 mg, Intravenous, Q24H, Anders Simmonds, MD, 40 mg at 08/23/15 2009 .  vancomycin (VANCOCIN) 50 mg/mL oral solution 500 mg, 500 mg, Oral, 4 times per day, Aldean Jewett, MD, 500 mg at 08/24/15 3704  IMAGING    Dg Chest 1 View  08/23/2015  CLINICAL DATA:  Central line placed on the right. EXAM: CHEST 1 VIEW COMPARISON:  08/21/2015. FINDINGS: Normal sized heart. Clear lungs. Endotracheal tube in satisfactory position. Nasogastric tube tip in the proximal stomach. Interval right jugular catheter with its tip in the upper right atrium or superior cavoatrial junction and. No pneumothorax. Unremarkable bones. IMPRESSION: 1. Right jugular catheter tip in the upper right atrium more superior cavoatrial junction without pneumothorax. 2. No acute abnormality. Electronically Signed   By: Claudie Revering M.D.   On: 08/23/2015 11:20      Indwelling Urinary Catheter continued, requirement due to   Reason to continue Indwelling Urinary Catheter for strict Intake/Output monitoring for hemodynamic instability         Ventilator continued, requirement due to, resp failure    Ventilator Sedation RASS 0 to -2   MAJOR EVENTS/TEST RESULTS: 11/4 intubated 11/5 failed vent wean   INDWELLING DEVICES:: 11/6 RIJ CVL, R Fem Vein  VasCath>>  MICRO DATA: MRSA PCR >>neg Urine 11/4>>negative Blood x 2 11/4>>No growth to date Resp  FLU H1N1>> NEG c diff + (antigend and toxin) 11/5  ANTIMICROBIALS:  Oral vanc>>11/5 Flagy>>11/5    ASSESSMENT/PLAN   49 yo AAM with acute resp failure with acute encephalopathy from acute sepsis from acute recurrent c diff colitis With acute renal failure from rhabdomyolysis with acute drug abuse with etoh abuse  PULMONARY-failed vent wean due to encephalopathy and acidosis -Respiratory Failure -continue Full MV support -continue Bronchodilator Therapy -Wean Fio2 and PEEP as tolerated -will perform SAT/SBT when respiratory parameters are met  CARDIOVASCULAR -blood pressure stable -not on vasopressors -IVF's  RENAL-Rhabdomyolysis Acute renal failure from ATN -currently on intermittent HD -trend and follow CK levels.    GASTROINTESTINAL GI prophylaxis  HEMATOLOGIC Decreased PLT count - 66 today -stop heparin infusion   INFECTIOUS + cdiff -oral vancomycin and flagyl  ENDOCRINE - ICU hypoglycemic\Hyperglycemia protocol   NEUROLOGIC - intubated and sedated - minimal sedation to achieve a RASS goal: -1  TTP less likely at this time: continue current care and management  I have personally obtained a history, examined the patient, evaluated laboratory and independently reviewed  imaging results, formulated the assessment and plan and placed orders.  The Patient requires high complexity decision making for assessment and support, frequent evaluation and titration of therapies, application of advanced monitoring technologies and extensive interpretation of multiple databases. Critical Care Time devoted to patient care services described in this note is 45 minutes.   Overall, patient is critically ill, prognosis is guarded. Patient at high risk for cardiac arrest and death.   Vilinda Boehringer, MD [facility] Pager [phone] (please enter  7-digits) On Call Pager - [phone] (please enter 7-digits)

## 2015-08-24 NOTE — Progress Notes (Signed)
Pre-hd tx 

## 2015-08-24 NOTE — Progress Notes (Signed)
[facility] [place] [facility]   PATIENT NAME: Ross [name]    MR#:  [id number]  DATE OF BIRTH:  08-07-1966  SUBJECTIVE:  CHIEF COMPLAINT:   Chief Complaint  Patient presents with  . Seizures   Intubated. Sedated.  REVIEW OF SYSTEMS:   ROS unable to obtain as the patient is intubated and sedated  DRUG ALLERGIES:   Allergies  Allergen Reactions  . Antihistamines, Diphenhydramine-Type Anaphylaxis and Rash  . Benadryl [Diphenhydramine Hcl] Anaphylaxis and Rash  . Hydroxyzine Anaphylaxis and Rash    VITALS:  Blood pressure 125/83, pulse 84, temperature 99.3 F (37.4 C), temperature source Other (Comment), resp. rate 17, height 5\' 11"  (1.803 m), weight 106.2 kg (234 lb 2.1 oz), SpO2 100 %.  PHYSICAL EXAMINATION:  GENERAL:  49 y.o.-year-old patient lying in the bed, medically ill appearing, diaphoretic, intubated EYES: Pupils equal, round, reactive to light and accommodation. No scleral icterus. Extraocular muscles intact.  HEENT: Head atraumatic, normocephalic. Oropharynx and nasopharynx clear.  NECK:  Supple, no jugular venous distention. No thyroid enlargement, no tenderness.  LUNGS: Normal breath sounds bilaterally, no wheezing, rales,rhonchi or crepitation. No use of accessory muscles of respiration.  CARDIOVASCULAR: S1, S2 normal. No murmurs, rubs, or gallops.  ABDOMEN: Soft, nontender, nondistended. Bowel sounds present. No organomegaly or mass.  EXTREMITIES: No pedal edema, cyanosis, or clubbing. Peripheral pulses 2+ NEUROLOGIC: Intubated and sedated . Has been following commands. Moves all 4 extremities SKIN: No obvious rash, lesion, or ulcer.    LABORATORY PANEL:   CBC  Recent Labs Lab 08/24/15 0943  WBC 8.5  HGB 11.4*  HCT 34.3*  PLT 66*   ------------------------------------------------------------------------------------------------------------------  Chemistries   Recent Labs Lab 08/21/15 1247  08/24/15 0943   NA 145  < > 143  K 3.7  < > 3.7  CL 104  < > 110  CO2 19*  < > 24  GLUCOSE 90  < > 95  BUN 38*  < > 45*  CREATININE 2.91*  < > 4.68*  CALCIUM 9.1  < > 6.6*  MG 2.7*  --   --   AST >2275*  < > 1274*  ALT 598*  < > 636*  ALKPHOS 92  < > 73  BILITOT 2.0*  < > 1.4*  < > = values in this interval not displayed. ------------------------------------------------------------------------------------------------------------------  Cardiac Enzymes  Recent Labs Lab 08/22/15 0240  TROPONINI 1.79*   ------------------------------------------------------------------------------------------------------------------  RADIOLOGY:  Dg Chest 1 View  08/23/2015  CLINICAL DATA:  Central line placed on the right. EXAM: CHEST 1 VIEW COMPARISON:  08/21/2015. FINDINGS: Normal sized heart. Clear lungs. Endotracheal tube in satisfactory position. Nasogastric tube tip in the proximal stomach. Interval right jugular catheter with its tip in the upper right atrium or superior cavoatrial junction and. No pneumothorax. Unremarkable bones. IMPRESSION: 1. Right jugular catheter tip in the upper right atrium more superior cavoatrial junction without pneumothorax. 2. No acute abnormality. Electronically Signed   By: Beckie SaltsSteven  Reid M.D.   On: 08/23/2015 11:20    EKG:   Orders placed or performed during the hospital encounter of 08/21/15  . EKG 12-Lead  . EKG 12-Lead    ASSESSMENT AND PLAN:   #1 fever/sepsis: Likely due to C. difficile colitis - Influenza negative, urine and blood cultures negative, HIV hepatitis panel negative - Clostridium difficile positive on oral vancomycin, fevers decreasing  #2 acute respiratory failure with hypoxia and altered mental status - Pulmonology following, possible extubation today - repeat chest x-ray  with no PNA - Unable to obtain CT angiography due to renal failure. Heparin drip had to be stopped due to thrombocytopenia  #3 rhabdomyolysis - Possibly due to prolonged  immobilization, hypotension, diarrhea, possible withdrawal seizures prior to admission - Medication effect also possible - Continue hydration, HD, CK decreasing slowly  #4 acute renal failure: Temp HD catheter placed and will have treatments as needed. - Due to ATN, rhabdomyolysis - Appreciate nephrology following - Continue IV fluids and bicarbonate  #5 thrombocytopenia - Possibly due to sepsis in the setting of C. difficile infection - patient also with history of heavy ETOH use. - stable, no bleeding noted  #6 elevated troponin - No concerning EKG changes, no events on telemetry.  - Heparin drip stopped due to thrombocytopenia - 2-D echocardiogram is normal with no focal wall motion abnormalities - Likely related to rhabdo - appreciate cardiology consultation  #7 altered mental status - Likely metabolic encephalopathy, we'll continue to follow  #8 acute hepatitis - Viral hepatitis panel negative - Some of the AST likely due to rhabdomyolysis - Likely shock liver as well as possible ETOH hepatitis  All the records are reviewed and case discussed with Care Management/Social Workerr. Management plans discussed with the patient, family and they are in agreement.  CODE STATUS: Full   TOTAL CRITICAL CARE TIME TAKING CARE OF THIS PATIENT: 35 minutes.  Greater than 50% of time spent in care coordination and counseling. POSSIBLE D/C IN ? DAYS, DEPENDING ON CLINICAL CONDITION.   Elby Showers M.D on 08/24/2015 at 3:39 PM  Between 7am to 6pm - Pager - [phone]  After 6pm go to [email or web address] - password [email or web address] [facility]  [facility]  Office  [phone]  CC: Primary care physician; No primary care provider on file.

## 2015-08-24 NOTE — Progress Notes (Signed)
   08/24/15 1036  Vent Select  Invasive or Noninvasive Invasive  Adult Vent Y  Airway 8 mm  Placement Date/Time: 08/21/15 1440   Placed By: (c) ED Physician  Airway Device: Endotracheal Tube  ETT Types: Oral  Size (mm): 8 mm  Cuffed: Cuffed  Insertion attempts: 1  Airway Equipment: Stylet;Video Laryngoscope  Placement Confirmation: Direct Vi...  Secured at (cm) 27 cm  Measured From Lips  Secured Location Center  Secured By Publishing rights managerCommercial Tube Holder  Adult Ventilator Settings  Vent Type Servo i  Vent Mode PSV  FiO2 (%) 35 %  Pressure Support 10 cmH20  PEEP 5 cmH20  Adult Ventilator Measurements  Peak Airway Pressure 15 L/min  Resp Rate Spontaneous 10 br/min  Resp Rate Total 10 br/min  Exhaled Vt 459 mL  Measured Ve 5.8 mL  HOB> 30 Degrees Y  Adult Ventilator Alarms  Alarms On Y  Ve High Alarm 20 L/min  Ve Low Alarm 2 L/min  Resp Rate High Alarm 40 br/min  Resp Rate Low Alarm 5  PEEP Low Alarm 2 cmH2O  Press High Alarm 50 cmH2O  Press Low Alarm 10 cmH2O  Placed patient on psv 10/5 per md

## 2015-08-24 NOTE — Progress Notes (Signed)
HD tx start 

## 2015-08-24 NOTE — Progress Notes (Signed)
Nutrition Follow-up    INTERVENTION:   Coordination of Care: received verbal order from MD Mungal to start TF this afternoon if pt unable to extubate.  EN: recommend starting Vital High Protein at rate of 20 ml/hr with goal rate of 45 ml/hr with Prostat TID providing 140 g of protein,1380 kcals, 907 mL of free water. Adult Tube Feeding Protocol entered. Continue to assess   NUTRITION DIAGNOSIS:   Inadequate oral intake related to acute illness as evidenced by NPO status.  GOAL:   Provide needs based on ASPEN/SCCM guidelines  MONITOR:    (Energy intake, Digestive system, Electrolyte and renal profile)  REASON FOR ASSESSMENT:   Ventilator    ASSESSMENT:    Pt remains on vent, attempted to wean today but spoke with Maralyn SagoSarah RN this afternoon who reports pt to remain on vent at this time. Pt receiving 2nd HD treatment as well today  Diet Order:  Diet NPO time specified  Electrolyte and Renal Profile:  Recent Labs Lab 08/21/15 1247 08/22/15 0241 08/23/15 0139 08/23/15 1346 08/24/15 0943  BUN 38* 33*  --  45* 45*  CREATININE 2.91* 2.03* 3.13* 4.15* 4.68*  NA 145 143  --  144 143  K 3.7 3.5  --  4.2 3.7  MG 2.7*  --   --   --   --   PHOS  --   --   --  4.7* 3.3   Glucose Profile:  Recent Labs  08/23/15 2143 08/24/15 0709 08/24/15 1112  GLUCAP 72 88 77   Meds: NS at 150 ml/hr, ss novolog   Height:   Ht Readings from Last 1 Encounters:  08/21/15 5\' 11"  (1.803 m)    Weight:   Wt Readings from Last 1 Encounters:  08/23/15 234 lb 2.1 oz (106.2 kg)    Ideal Body Weight:     BMI:  Body mass index is 32.67 kg/(m^2).  Estimated Nutritional Needs:   Kcal:  (11/14 kcals/kg) [id number] kcals/d  Protein:  (156-195 g/kg) (2.0-2.5 g/kg Using IBW of 78kg)  Fluid:  (25-6930ml/kg) 1950-23140ml/d  EDUCATION NEEDS:   No education needs identified at this time  Romelle StarcherCate Kenzi Bardwell MS, RD, LDN [phone] Pager

## 2015-08-24 NOTE — Progress Notes (Signed)
Subjective:   Emergent hemodialysis yesterday. 2 hours. No ultrafiltration. UOP 845.  Unfortunately with less urine output today and rising creatinine.  Critically ill.   Objective:  Vital signs in last 24 hours:  Temp:  [98.6 F (37 C)-100.2 F (37.9 C)] 99.1 F (37.3 C) (11/07 1159) Pulse Rate:  [84-94] 89 (11/07 1159) Resp:  [10-17] 16 (11/07 1159) BP: (92-116)/(61-83) 112/73 mmHg (11/07 1042) SpO2:  [97 %-100 %] 98 % (11/07 1159) FiO2 (%):  [35 %] 35 % (11/07 1159) Weight:  [106.2 kg (234 lb 2.1 oz)] 106.2 kg (234 lb 2.1 oz) (11/06 1345)  Weight change:  Filed Weights   08/21/15 1240 08/23/15 1345  Weight: 99.026 kg (218 lb 5 oz) 106.2 kg (234 lb 2.1 oz)    Intake/Output:    Intake/Output Summary (Last 24 hours) at 08/24/15 1254 Last data filed at 08/24/15 1229  Gross per 24 hour  Intake 4054.12 ml  Output    888 ml  Net 3166.12 ml     Physical Exam: General:  critically ill-appearing  HEENT  eyes open, appears to be looking around   Neck  supple  Pulm/lungs  ventilator dependent, PRVC FiO2 35%  CVS/Heart  tachycardic sinus  Abdomen:   soft, +bowel sounds, nontender  Extremities:  no significant peripheral edema  Neurologic:  sedated  Skin:  no acute rashes  Access: Right femoral non tunneled catheter Dr. Belia Heman 11/6    Foley present    Basic Metabolic Panel:   Recent Labs Lab 08/21/15 1247 08/22/15 0241 08/23/15 0139 08/23/15 1346 08/24/15 0943  NA 145 143  --  144 143  K 3.7 3.5  --  4.2 3.7  CL 104 113*  --  107 110  CO2 19* 24  --  24 24  GLUCOSE 90 137*  --  97 95  BUN 38* 33*  --  45* 45*  CREATININE 2.91* 2.03* 3.13* 4.15* 4.68*  CALCIUM 9.1 7.2*  --  6.5* 6.6*  MG 2.7*  --   --   --   --   PHOS  --   --   --  4.7* 3.3     CBC:  Recent Labs Lab 08/21/15 1247 08/22/15 0241 08/23/15 0139 08/24/15 0943  WBC 5.8 5.2 5.7 8.5  NEUTROABS 4.7  --  4.9  --   HGB 15.9 14.3 13.6 11.4*  HCT 47.2 44.0 41.8 34.3*  MCV 90.9 90.4 91.7  90.1  PLT PLATELET CLUMPS NOTED ON SMEAR, COUNT APPEARS DECREASED 56* 51* 66*      Microbiology:  Recent Results (from the past 720 hour(s))  Blood Culture (routine x 2)     Status: None (Preliminary result)   Collection Time: 08/21/15 12:47 PM  Result Value Ref Range Status   Specimen Description BLOOD UNKNOWN  Final   Special Requests BOTTLES DRAWN AEROBIC AND ANAEROBIC  1CC  Final   Culture NO GROWTH 3 DAYS  Final   Report Status PENDING  Incomplete  Blood Culture (routine x 2)     Status: None (Preliminary result)   Collection Time: 08/21/15 12:47 PM  Result Value Ref Range Status   Specimen Description BLOOD LEFT AC  Final   Special Requests BOTTLES DRAWN AEROBIC AND ANAEROBIC  6CC  Final   Culture NO GROWTH 3 DAYS  Final   Report Status PENDING  Incomplete  Urine culture     Status: None   Collection Time: 08/21/15 12:47 PM  Result Value Ref Range Status  Specimen Description URINE, CATHETERIZED  Final   Special Requests Normal  Final   Culture NO GROWTH 2 DAYS  Final   Report Status 08/23/2015 FINAL  Final  MRSA PCR Screening     Status: None   Collection Time: 08/21/15 11:47 PM  Result Value Ref Range Status   MRSA by PCR NEGATIVE NEGATIVE Final    Comment:        The GeneXpert MRSA Assay (FDA approved for NASAL specimens only), is one component of a comprehensive MRSA colonization surveillance program. It is not intended to diagnose MRSA infection nor to guide or monitor treatment for MRSA infections.   C difficile quick scan w PCR reflex     Status: Abnormal   Collection Time: 08/22/15  2:31 PM  Result Value Ref Range Status   C Diff antigen POSITIVE (A) NEGATIVE Final   C Diff toxin POSITIVE (A) NEGATIVE Final   C Diff interpretation   Final    Positive for toxigenic C. difficile, active toxin production present.    Comment: CRITICAL RESULT CALLED TO, READ BACK BY AND VERIFIED WITH: CHARLIE FLEETWOOD,RN 08/22/2015 1544 BY J RAZZAKSUAREZ,MT      Coagulation Studies: No results for input(s): LABPROT, INR in the last 72 hours.  Urinalysis:  Recent Labs  08/23/15 0451 08/23/15 0522  COLORURINE AMBER* AMBER*  LABSPEC 1.013 1.017  PHURINE 5.0 5.0  GLUCOSEU NEGATIVE 50*  HGBUR 3+* 3+*  BILIRUBINUR NEGATIVE NEGATIVE  KETONESUR TRACE* TRACE*  PROTEINUR 100* 100*  NITRITE NEGATIVE NEGATIVE  LEUKOCYTESUR NEGATIVE TRACE*      Imaging: Dg Chest 1 View  08/23/2015  CLINICAL DATA:  Central line placed on the right. EXAM: CHEST 1 VIEW COMPARISON:  08/21/2015. FINDINGS: Normal sized heart. Clear lungs. Endotracheal tube in satisfactory position. Nasogastric tube tip in the proximal stomach. Interval right jugular catheter with its tip in the upper right atrium or superior cavoatrial junction and. No pneumothorax. Unremarkable bones. IMPRESSION: 1. Right jugular catheter tip in the upper right atrium more superior cavoatrial junction without pneumothorax. 2. No acute abnormality. Electronically Signed   By: Beckie SaltsSteven  Reid M.D.   On: 08/23/2015 11:20     Medications:   . sodium chloride 150 mL/hr at 08/24/15 0823  . fentaNYL infusion INTRAVENOUS Stopped (08/24/15 1030)  . midazolam (VERSED) infusion Stopped (08/24/15 0800)   . antiseptic oral rinse  7 mL Mouth Rinse QID  . chlorhexidine gluconate  15 mL Mouth Rinse BID  . famotidine  20 mg Oral Q24H  . insulin aspart  0-5 Units Subcutaneous QHS  . insulin aspart  0-9 Units Subcutaneous TID WC  . vancomycin  125 mg Oral 4 times per day   acetaminophen **OR** acetaminophen  Assessment/ Plan:  49 y.o. male With alcohol abuse, schizophrenia, seizure disorder, coronary disease/MI 2012 was admitted to [facility] on November 4 after being found unresponsive in his apartment. Urine toxicology screen is positive for benzodiazepines and cannabinoids. Stool positive for C. Difficile  1. Acute renal failure, likely secondary to ATN from concurrent illness, volume depletion, C. difficile,  rhabdomyolysis. Urine output is poor but nonoliguric. Serum creatinine is worsening CK level is worsening - pending for today.  Hemodialysis initial treatment for acute renal failure yesterday Plan on second hemodialysis treatment today. Orders prepared.   2. Acute rhabdomyolysis:  Continue to monitor CK levels daily IV NS at 150  3. Acute respiratory failure: Ventilator assisted at this time - appreciate pulmonary input.   4. Hypertension: blood pressure at goal.  5. Infective Colitis: c. Diff positive.  - PO vancomycin    LOS: 3 Uilani Sanville 11/7/201612:54 PM

## 2015-08-24 NOTE — Consult Note (Signed)
CC: altered mental status   HPI: Ross [name] is an 50 y.o. male with a known history of alcohol abuse, schizophrenia, seizure disorder, coronary artery disease status post MI in 2012 resents by EMS after being found unresponsive in his apartment.  Pt found to be septic with multi organ failure.  Pt is s/p intubation.    Pt is able to follow simple commands today.   Past Medical History  Diagnosis Date  . Sleep apnea     had surgery to correct it  . Mental disorder     PTSD  . Seizures (HCC)     currently dx with seizures  . Cardiac arrest (HCC) 08/23/2011  . Pneumonia 09/04/2011  . Pancreatitis   . SOB (shortness of breath) 09/11/2011  . Hypertension   . Anxiety   . Myocardial infarction (HCC)   . Schizophrenia (HCC)   . Rhabdomyolysis 08/23/2015  . Acute renal failure (HCC) 08/23/2015    Past Surgical History  Procedure Laterality Date  . Tonsillectomy    . Uvulopalatopharyngoplasty, tonsillectomy and septoplasty  06/16/2000  . Left heart catheterization with coronary angiogram N/A 08/24/2011    Procedure: LEFT HEART CATHETERIZATION WITH CORONARY ANGIOGRAM;  Surgeon: Corky Crafts, MD;  Location: [facility] CATH LAB;  Service: Cardiovascular;  Laterality: N/A;    Family History  Problem Relation Age of Onset  . Heart attack Mother     Social History:  reports that he has quit smoking. His smoking use included Cigarettes. He has never used smokeless tobacco. He reports that he drinks alcohol. He reports that he does not use illicit drugs.  Allergies  Allergen Reactions  . Antihistamines, Diphenhydramine-Type Anaphylaxis and Rash  . Benadryl [Diphenhydramine Hcl] Anaphylaxis and Rash  . Hydroxyzine Anaphylaxis and Rash    Medications: I have reviewed the patient's current medications.  ROS: Unable to obtain   Physical Examination: Blood pressure 119/78, pulse 86, temperature 99.1 F (37.3 C), temperature source Other (Comment), resp. rate 20, height   (1.803 m), weight 234 lb 2.1 oz (106.2 kg), SpO2 100 %.  EOM intact tracks through the room Withdraws from painful stimuli bilaterally  Responds to visual threats   Laboratory Studies:   Basic Metabolic Panel:  Recent Labs Lab 08/21/15 1247 08/22/15 0241 08/23/15 0139 08/23/15 1346 08/24/15 0943  NA 145 143  --  144 143  K 3.7 3.5  --  4.2 3.7  CL 104 113*  --  107 110  CO2 19* 24  --  24 24  GLUCOSE 90 137*  --  97 95  BUN 38* 33*  --  45* 45*  CREATININE 2.91* 2.03* 3.13* 4.15* 4.68*  CALCIUM 9.1 7.2*  --  6.5* 6.6*  MG 2.7*  --   --   --   --   PHOS  --   --   --  4.7* 3.3    Liver Function Tests:  Recent Labs Lab 08/21/15 1247 08/22/15 0241 08/23/15 1346 08/24/15 0943  AST >2275* >2275*  --   --   ALT 598* 1381*  --   --   ALKPHOS 92 71  --   --   BILITOT 2.0* 1.3*  --   --   PROT 7.9 6.5  --   --   ALBUMIN 4.1 3.4* 2.4* 2.3*    Recent Labs Lab 08/21/15 1247  LIPASE 18   No results for input(s): AMMONIA in the last 168 hours.  CBC:  Recent Labs Lab 08/21/15 1247 08/22/15  0241 08/23/15 0139 08/24/15 0943  WBC 5.8 5.2 5.7 8.5  NEUTROABS 4.7  --  4.9  --   HGB 15.9 14.3 13.6 11.4*  HCT 47.2 44.0 41.8 34.3*  MCV 90.9 90.4 91.7 90.1  PLT PLATELET CLUMPS NOTED ON SMEAR, COUNT APPEARS DECREASED 56* 51* 66*    Cardiac Enzymes:  Recent Labs Lab 08/21/15 1247 08/21/15 2031 08/22/15 0240 08/22/15 0904  CKTOTAL 16109*  --   --  60454*  TROPONINI 1.56* 1.73* 1.79*  --     BNP: Invalid input(s): POCBNP  CBG:  Recent Labs Lab 08/23/15 1145 08/23/15 1740 08/23/15 2143 08/24/15 0709 08/24/15 1112  GLUCAP 97 93 72 88 77    Microbiology: Results for orders placed or performed during the hospital encounter of 08/21/15  Blood Culture (routine x 2)     Status: None (Preliminary result)   Collection Time: 08/21/15 12:47 PM  Result Value Ref Range Status   Specimen Description BLOOD UNKNOWN  Final   Special Requests BOTTLES DRAWN  AEROBIC AND ANAEROBIC  1CC  Final   Culture NO GROWTH 3 DAYS  Final   Report Status PENDING  Incomplete  Blood Culture (routine x 2)     Status: None (Preliminary result)   Collection Time: 08/21/15 12:47 PM  Result Value Ref Range Status   Specimen Description BLOOD LEFT AC  Final   Special Requests BOTTLES DRAWN AEROBIC AND ANAEROBIC  6CC  Final   Culture NO GROWTH 3 DAYS  Final   Report Status PENDING  Incomplete  Urine culture     Status: None   Collection Time: 08/21/15 12:47 PM  Result Value Ref Range Status   Specimen Description URINE, CATHETERIZED  Final   Special Requests Normal  Final   Culture NO GROWTH 2 DAYS  Final   Report Status 08/23/2015 FINAL  Final  MRSA PCR Screening     Status: None   Collection Time: 08/21/15 11:47 PM  Result Value Ref Range Status   MRSA by PCR NEGATIVE NEGATIVE Final    Comment:        The GeneXpert MRSA Assay (FDA approved for NASAL specimens only), is one component of a comprehensive MRSA colonization surveillance program. It is not intended to diagnose MRSA infection nor to guide or monitor treatment for MRSA infections.   C difficile quick scan w PCR reflex     Status: Abnormal   Collection Time: 08/22/15  2:31 PM  Result Value Ref Range Status   C Diff antigen POSITIVE (A) NEGATIVE Final   C Diff toxin POSITIVE (A) NEGATIVE Final   C Diff interpretation   Final    Positive for toxigenic C. difficile, active toxin production present.    Comment: CRITICAL RESULT CALLED TO, READ BACK BY AND VERIFIED WITH: CHARLIE FLEETWOOD,RN 08/22/2015 1544 BY J RAZZAKSUAREZ,MT     Coagulation Studies: No results for input(s): LABPROT, INR in the last 72 hours.  Urinalysis:   Recent Labs Lab 08/23/15 0451 08/23/15 0522  COLORURINE AMBER* AMBER*  LABSPEC 1.013 1.017  PHURINE 5.0 5.0  GLUCOSEU NEGATIVE 50*  HGBUR 3+* 3+*  BILIRUBINUR NEGATIVE NEGATIVE  KETONESUR TRACE* TRACE*  PROTEINUR 100* 100*  NITRITE NEGATIVE NEGATIVE   LEUKOCYTESUR NEGATIVE TRACE*    Lipid Panel:     Component Value Date/Time   CHOL 259* 09/05/2011 0535   TRIG 131 09/05/2011 0535   HDL 50 09/05/2011 0535   CHOLHDL 5.2 09/05/2011 0535   VLDL 26 09/05/2011 0535   LDLCALC 183* 09/05/2011  0535    HgbA1C:  Lab Results  Component Value Date   HGBA1C 5.2 08/21/2015    Urine Drug Screen:      Component Value Date/Time   LABOPIA NONE DETECTED 08/21/2015 1247   LABOPIA NONE DETECTED 03/27/2015 2206   COCAINSCRNUR NONE DETECTED 03/27/2015 2206   LABBENZ POSITIVE* 08/21/2015 1247   LABBENZ NONE DETECTED 03/27/2015 2206   AMPHETMU NONE DETECTED 08/21/2015 1247   AMPHETMU NONE DETECTED 03/27/2015 2206   THCU POSITIVE* 08/21/2015 1247   THCU NONE DETECTED 03/27/2015 2206   LABBARB NONE DETECTED 08/21/2015 1247   LABBARB NONE DETECTED 03/27/2015 2206    Alcohol Level:   Recent Labs Lab 08/21/15 1414  ETH <5     Imaging: Dg Chest 1 View  08/23/2015  CLINICAL DATA:  Central line placed on the right. EXAM: CHEST 1 VIEW COMPARISON:  08/21/2015. FINDINGS: Normal sized heart. Clear lungs. Endotracheal tube in satisfactory position. Nasogastric tube tip in the proximal stomach. Interval right jugular catheter with its tip in the upper right atrium or superior cavoatrial junction and. No pneumothorax. Unremarkable bones. IMPRESSION: 1. Right jugular catheter tip in the upper right atrium more superior cavoatrial junction without pneumothorax. 2. No acute abnormality. Electronically Signed   By: Beckie SaltsSteven  Reid M.D.   On: 08/23/2015 11:20     Assessment/Plan: 49 y.o. male with a known history of alcohol abuse, schizophrenia, seizure disorder, coronary artery disease status post MI in 2012 resents by EMS after being found unresponsive in his apartment.  Pt found to be septic with multi organ failure.  Pt is s/p intubation.    No reported seizures overnight S/p EEG this AM will follow results of read But clinically is improving and do  not think there is seizure activity No need for anti epileptics at this time titration off sedation.    Pauletta BrownsZEYLIKMAN, Raidyn Wassink  08/24/2015, 2:38 PM

## 2015-08-25 ENCOUNTER — Inpatient Hospital Stay: Payer: Medicare Other

## 2015-08-25 DIAGNOSIS — J9601 Acute respiratory failure with hypoxia: Secondary | ICD-10-CM

## 2015-08-25 DIAGNOSIS — R4182 Altered mental status, unspecified: Secondary | ICD-10-CM

## 2015-08-25 DIAGNOSIS — T796XXD Traumatic ischemia of muscle, subsequent encounter: Secondary | ICD-10-CM

## 2015-08-25 DIAGNOSIS — T796XXA Traumatic ischemia of muscle, initial encounter: Secondary | ICD-10-CM

## 2015-08-25 DIAGNOSIS — R410 Disorientation, unspecified: Secondary | ICD-10-CM

## 2015-08-25 LAB — RENAL FUNCTION PANEL
Albumin: 2.3 g/dL — ABNORMAL LOW (ref 3.5–5.0)
Anion gap: 9 (ref 5–15)
BUN: 38 mg/dL — ABNORMAL HIGH (ref 6–20)
CALCIUM: 7.5 mg/dL — AB (ref 8.9–10.3)
CHLORIDE: 106 mmol/L (ref 101–111)
CO2: 28 mmol/L (ref 22–32)
CREATININE: 4.18 mg/dL — AB (ref 0.61–1.24)
GFR, EST AFRICAN AMERICAN: 18 mL/min — AB (ref >60)
GFR, EST NON AFRICAN AMERICAN: 16 mL/min — AB (ref >60)
Glucose, Bld: 95 mg/dL (ref 65–99)
POTASSIUM: 3.4 mmol/L — AB (ref 3.5–5.1)
Phosphorus: 3.7 mg/dL (ref 2.5–4.6)
Sodium: 143 mmol/L (ref 135–145)

## 2015-08-25 LAB — GLUCOSE, CAPILLARY
GLUCOSE-CAPILLARY: 111 mg/dL — AB (ref 65–99)
GLUCOSE-CAPILLARY: 112 mg/dL — AB (ref 65–99)
GLUCOSE-CAPILLARY: 126 mg/dL — AB (ref 65–99)
GLUCOSE-CAPILLARY: 68 mg/dL (ref 65–99)
GLUCOSE-CAPILLARY: 85 mg/dL (ref 65–99)
Glucose-Capillary: 139 mg/dL — ABNORMAL HIGH (ref 65–99)
Glucose-Capillary: 90 mg/dL (ref 65–99)

## 2015-08-25 LAB — HEPATIC FUNCTION PANEL
ALBUMIN: 2.4 g/dL — AB (ref 3.5–5.0)
ALT: 548 U/L — ABNORMAL HIGH (ref 17–63)
AST: 1376 U/L — ABNORMAL HIGH (ref 15–41)
Alkaline Phosphatase: 121 U/L (ref 38–126)
BILIRUBIN DIRECT: 0.3 mg/dL (ref 0.1–0.5)
BILIRUBIN INDIRECT: 0.8 mg/dL (ref 0.3–0.9)
BILIRUBIN TOTAL: 1.1 mg/dL (ref 0.3–1.2)
Total Protein: 5.6 g/dL — ABNORMAL LOW (ref 6.5–8.1)

## 2015-08-25 LAB — CBC
HEMATOCRIT: 34.6 % — AB (ref 40.0–52.0)
HEMOGLOBIN: 11.4 g/dL — AB (ref 13.0–18.0)
MCH: 29.9 pg (ref 26.0–34.0)
MCHC: 33 g/dL (ref 32.0–36.0)
MCV: 90.8 fL (ref 80.0–100.0)
Platelets: 84 10*3/uL — ABNORMAL LOW (ref 150–440)
RBC: 3.81 MIL/uL — AB (ref 4.40–5.90)
RDW: 14.5 % (ref 11.5–14.5)
WBC: 8.5 10*3/uL (ref 3.8–10.6)

## 2015-08-25 LAB — CK: CK TOTAL: 28484 U/L — AB (ref 49–397)

## 2015-08-25 MED ORDER — SODIUM CHLORIDE 0.9 % IV SOLN
1.0000 g | Freq: Two times a day (BID) | INTRAVENOUS | Status: DC
  Administered 2015-08-25 – 2015-08-26 (×2): 1 g via INTRAVENOUS
  Filled 2015-08-25 (×3): qty 1

## 2015-08-25 MED ORDER — FENTANYL BOLUS VIA INFUSION
50.0000 ug | Freq: Once | INTRAVENOUS | Status: AC
  Administered 2015-08-25: 50 ug via INTRAVENOUS

## 2015-08-25 MED ORDER — DEXMEDETOMIDINE HCL IN NACL 400 MCG/100ML IV SOLN
0.4000 ug/kg/h | INTRAVENOUS | Status: DC
  Administered 2015-08-25 (×2): 1 ug/kg/h via INTRAVENOUS
  Administered 2015-08-25: 1.2 ug/kg/h via INTRAVENOUS
  Administered 2015-08-25: 32.6 ug/kg/h via INTRAVENOUS
  Administered 2015-08-25: 0.4 ug/kg/h via INTRAVENOUS
  Administered 2015-08-26: 0.8 ug/kg/h via INTRAVENOUS
  Administered 2015-08-26: 0.25 ug/kg/h via INTRAVENOUS
  Administered 2015-08-26 (×2): 0.8 ug/kg/h via INTRAVENOUS
  Administered 2015-08-26 (×2): 1.2 ug/kg/h via INTRAVENOUS
  Administered 2015-08-27: 0.8 ug/kg/h via INTRAVENOUS
  Administered 2015-08-28: 0.7 ug/kg/h via INTRAVENOUS
  Filled 2015-08-25 (×17): qty 100

## 2015-08-25 MED ORDER — MIDAZOLAM BOLUS VIA INFUSION
2.0000 mg | Freq: Once | INTRAVENOUS | Status: AC
  Administered 2015-08-25: 2 mg via INTRAVENOUS

## 2015-08-25 MED ORDER — VANCOMYCIN HCL IN DEXTROSE 1-5 GM/200ML-% IV SOLN
1000.0000 mg | INTRAVENOUS | Status: DC
  Administered 2015-08-25: 1000 mg via INTRAVENOUS
  Filled 2015-08-25 (×2): qty 200

## 2015-08-25 MED ORDER — ACETYLCYSTEINE 20 % IN SOLN
3.0000 mL | Freq: Four times a day (QID) | RESPIRATORY_TRACT | Status: AC
  Administered 2015-08-25: 3 mL via RESPIRATORY_TRACT
  Filled 2015-08-25: qty 4

## 2015-08-25 NOTE — Progress Notes (Signed)
Spoke with Dr. Loretha BrasilZeylikman in person. Discussed patient's condition and agitation when stimulated. MD ordered precedex to try and help patient stay more awake and calm.

## 2015-08-25 NOTE — Progress Notes (Signed)
Subjective:   Hemodialysis for last two days for acute renal failure and rhabdomyolysis.  [place] [address] UOP 624 (845) UF of 1.5   Tmax 100.2  Objective:  Vital signs in last 24 hours:  Temp:  [93.7 F (34.3 C)-100.2 F (37.9 C)] 100.2 F (37.9 C) (11/08 0800) Pulse Rate:  [84-101] 101 (11/08 0800) Resp:  [9-20] 16 (11/08 0800) BP: (105-138)/(69-96) 134/81 mmHg (11/08 0800) SpO2:  [96 %-100 %] 100 % (11/08 0800) FiO2 (%):  [35 %] 35 % (11/08 0800) Weight:  [108.5 kg (239 lb 3.2 oz)] 108.5 kg (239 lb 3.2 oz) (11/08 0634)  Weight change: 2.3 kg (5 lb 1.1 oz) Filed Weights   08/21/15 1240 08/23/15 1345 08/25/15 0634  Weight: 99.026 kg (218 lb 5 oz) 106.2 kg (234 lb 2.1 oz) 108.5 kg (239 lb 3.2 oz)    Intake/Output:    Intake/Output Summary (Last 24 hours) at 08/25/15 0859 Last data filed at 08/25/15 0802  Gross per 24 hour  Intake 2393.81 ml  Output   2176 ml  Net 217.81 ml     Physical Exam: General:  critically ill-appearing  HEENT  eyes open, appears to be looking around   Neck  supple  Pulm/lungs  ventilator dependent, PRVC FiO2 35%  CVS/Heart  tachycardic sinus  Abdomen:   soft, +bowel sounds, nontender  Extremities:  no significant peripheral edema  Neurologic:  sedated  Skin:  no acute rashes  Access: Right femoral non tunneled catheter Dr. Belia Heman 11/6  GU  Foley     Basic Metabolic Panel:   Recent Labs Lab 08/21/15 1247 08/22/15 0241 08/23/15 0139 08/23/15 1346 08/24/15 0943 08/25/15 0503  NA 145 143  --  144 143 143  K 3.7 3.5  --  4.2 3.7 3.4*  CL 104 113*  --  107 110 106  CO2 19* 24  --  GLUCOSE 90 137*  --  97 95 95  BUN 38* 33*  --  45* 45* 38*  CREATININE 2.91* 2.03* 3.13* 4.15* 4.68* 4.18*  CALCIUM 9.1 7.2*  --  6.5* 6.6* 7.5*  MG 2.7*  --   --   --   --   --   PHOS  --   --   --  4.7* 3.3 3.7     CBC:  Recent Labs Lab 08/21/15 1247 08/22/15 0241 08/23/15 0139 08/24/15 0943 08/25/15 0459  WBC 5.8 5.2 5.7 8.5 8.5   NEUTROABS 4.7  --  4.9  --   --   HGB 15.9 14.3 13.6 11.4* 11.4*  HCT 47.2 44.0 41.8 34.3* 34.6*  MCV 90.9 90.4 91.7 90.1 90.8  PLT PLATELET CLUMPS NOTED ON SMEAR, COUNT APPEARS DECREASED 56* 51* 66* 84*      Microbiology:  Recent Results (from the past 720 hour(s))  Blood Culture (routine x 2)     Status: None (Preliminary result)   Collection Time: 08/21/15 12:47 PM  Result Value Ref Range Status   Specimen Description BLOOD UNKNOWN  Final   Special Requests BOTTLES DRAWN AEROBIC AND ANAEROBIC  1CC  Final   Culture NO GROWTH 3 DAYS  Final   Report Status PENDING  Incomplete  Blood Culture (routine x 2)     Status: None (Preliminary result)   Collection Time: 08/21/15 12:47 PM  Result Value Ref Range Status   Specimen Description BLOOD LEFT AC  Final   Special Requests BOTTLES DRAWN AEROBIC AND ANAEROBIC  6CC  Final   Culture  NO GROWTH 3 DAYS  Final   Report Status PENDING  Incomplete  Urine culture     Status: None   Collection Time: 08/21/15 12:47 PM  Result Value Ref Range Status   Specimen Description URINE, CATHETERIZED  Final   Special Requests Normal  Final   Culture NO GROWTH 2 DAYS  Final   Report Status 08/23/2015 FINAL  Final  MRSA PCR Screening     Status: None   Collection Time: 08/21/15 11:47 PM  Result Value Ref Range Status   MRSA by PCR NEGATIVE NEGATIVE Final    Comment:        The GeneXpert MRSA Assay (FDA approved for NASAL specimens only), is one component of a comprehensive MRSA colonization surveillance program. It is not intended to diagnose MRSA infection nor to guide or monitor treatment for MRSA infections.   C difficile quick scan w PCR reflex     Status: Abnormal   Collection Time: 08/22/15  2:31 PM  Result Value Ref Range Status   C Diff antigen POSITIVE (A) NEGATIVE Final   C Diff toxin POSITIVE (A) NEGATIVE Final   C Diff interpretation   Final    Positive for toxigenic C. difficile, active toxin production present.    Comment:  CRITICAL RESULT CALLED TO, READ BACK BY AND VERIFIED WITH: CHARLIE FLEETWOOD,RN 08/22/2015 1544 BY J RAZZAKSUAREZ,MT     Coagulation Studies: No results for input(s): LABPROT, INR in the last 72 hours.  Urinalysis:  Recent Labs  08/23/15 0451 08/23/15 0522  COLORURINE AMBER* AMBER*  LABSPEC 1.013 1.017  PHURINE 5.0 5.0  GLUCOSEU NEGATIVE 50*  HGBUR 3+* 3+*  BILIRUBINUR NEGATIVE NEGATIVE  KETONESUR TRACE* TRACE*  PROTEINUR 100* 100*  NITRITE NEGATIVE NEGATIVE  LEUKOCYTESUR NEGATIVE TRACE*      Imaging: Dg Chest 1 View  08/23/2015  CLINICAL DATA:  Central line placed on the right. EXAM: CHEST 1 VIEW COMPARISON:  08/21/2015. FINDINGS: Normal sized heart. Clear lungs. Endotracheal tube in satisfactory position. Nasogastric tube tip in the proximal stomach. Interval right jugular catheter with its tip in the upper right atrium or superior cavoatrial junction and. No pneumothorax. Unremarkable bones. IMPRESSION: 1. Right jugular catheter tip in the upper right atrium more superior cavoatrial junction without pneumothorax. 2. No acute abnormality. Electronically Signed   By: Beckie Salts M.D.   On: 08/23/2015 11:20     Medications:   . sodium chloride 150 mL/hr at 08/24/15 2126  . fentaNYL infusion INTRAVENOUS 400 mcg/hr (08/25/15 0800)  . midazolam (VERSED) infusion 1 mg/hr (08/25/15 0802)   . antiseptic oral rinse  7 mL Mouth Rinse QID  . chlorhexidine gluconate  15 mL Mouth Rinse BID  . famotidine  20 mg Oral Q24H  . feeding supplement (VITAL HIGH PROTEIN)  1,000 mL Per Tube Q24H  . free water  100 mL Per Tube 3 times per day  . insulin aspart  0-5 Units Subcutaneous QHS  . insulin aspart  0-9 Units Subcutaneous TID WC  . vancomycin  125 mg Oral 4 times per day   acetaminophen **OR** acetaminophen  Assessment/ Plan:  49 y.o. male With alcohol abuse, schizophrenia, seizure disorder, coronary disease/MI 2012 was admitted to [facility] on November 4 after being found unresponsive  in his apartment. Urine toxicology screen is positive for benzodiazepines and cannabinoids. Stool positive for C. Difficile  1. Acute renal failure, likely secondary to ATN from concurrent illness, volume depletion, C. difficile, rhabdomyolysis. Urine output is poor but nonoliguric. Serum creatinine is  not improving.  CK level still critical levels- pending for today.  Hemodialysis initial treatment for acute renal failure 11/6. Second treatment on 11/7. Plan for third treatment later today. Orders prepared. 3K bath  2. Acute rhabdomyolysis M62.82: with elevated transaminases Continue to monitor CK levels and liver function daily IV NS at 150  3. Acute respiratory failure: Ventilator assisted at this time - appreciate pulmonary input.   4. Hypertension: blood pressure at goal.   5. Infective Colitis A09: c. Diff positive.  - PO vancomycin    LOS: 4 Rajah Lamba 11/8/20168:59 AM

## 2015-08-25 NOTE — Progress Notes (Signed)
HD tx start 

## 2015-08-25 NOTE — Progress Notes (Signed)
[facility] -  at [facility]   PATIENT NAME: Ross [name]    MR#:  [id number]  DATE OF BIRTH:  [birth year]  SUBJECTIVE:  CHIEF COMPLAINT:   Chief Complaint  Patient presents with  . Seizures   Intubated. Sedated. Continues to be febrile  REVIEW OF SYSTEMS:   ROS unable to obtain as the patient is intubated and sedated  DRUG ALLERGIES:   Allergies  Allergen Reactions  . Antihistamines, Diphenhydramine-Type Anaphylaxis and Rash  . Benadryl [Diphenhydramine Hcl] Anaphylaxis and Rash  . Hydroxyzine Anaphylaxis and Rash    VITALS:  Blood pressure 123/71, pulse 80, temperature 100.9 F (38.3 C), temperature source Other (Comment), resp. rate 13, height 5\' 11"  (1.803 m), weight 110.8 kg (244 lb 4.3 oz), SpO2 98 %.  PHYSICAL EXAMINATION:  GENERAL:  49 y.o.-year-old patient lying in the bed, critically ill appearing, diaphoretic, intubated EYES: Pupils equal, round, reactive to light and accommodation. No scleral icterus. Extraocular muscles intact.  HEENT: Head atraumatic, normocephalic. Oropharynx and nasopharynx clear.  NECK:  Supple, no jugular venous distention. No thyroid enlargement, no tenderness.  LUNGS: Normal breath sounds bilaterally, no wheezing, rales,rhonchi or crepitation. No use of accessory muscles of respiration.  CARDIOVASCULAR: S1, S2 normal. No murmurs, rubs, or gallops.  ABDOMEN: Soft, nontender, nondistended. Bowel sounds present. No organomegaly or mass.  EXTREMITIES: No pedal edema, cyanosis, or clubbing. Peripheral pulses 2+ NEUROLOGIC: Intubated and sedated . Has been following commands. Moves all 4 extremities SKIN: No obvious rash, lesion, or ulcer.    LABORATORY PANEL:   CBC  Recent Labs Lab 08/25/15 0459  WBC 8.5  HGB 11.4*  HCT 34.6*  PLT 84*   ------------------------------------------------------------------------------------------------------------------  Chemistries   Recent Labs Lab  08/21/15 1247  08/25/15 0459 08/25/15 0503  NA 145  < >  --  143  K 3.7  < >  --  3.4*  CL 104  < >  --  106  CO2 19*  < >  --  28  GLUCOSE 90  < >  --  95  BUN 38*  < >  --  38*  CREATININE 2.91*  < >  --  4.18*  CALCIUM 9.1  < >  --  7.5*  MG 2.7*  --   --   --   AST >2275*  < > 1376*  --   ALT 598*  < > 548*  --   ALKPHOS 92  < > 121  --   BILITOT 2.0*  < > 1.1  --   < > = values in this interval not displayed. ------------------------------------------------------------------------------------------------------------------  Cardiac Enzymes  Recent Labs Lab 08/22/15 0240  TROPONINI 1.79*   ------------------------------------------------------------------------------------------------------------------  RADIOLOGY:  Dg Chest Port 1 View  08/25/2015  CLINICAL DATA:  Respiratory failure, intubated. EXAM: PORTABLE CHEST 1 VIEW COMPARISON:  08/23/2015 FINDINGS: Endotracheal tube, NG tube and right central line remain in place, unchanged. Low lung volumes with increasing vascular congestion. No overt edema. Heart is upper limits normal in size. No visible effusions. IMPRESSION: Decreasing lung volumes with increasing vascular congestion. Electronically Signed   By: Charlett NoseKevin  Dover M.D.   On: 08/25/2015 11:57    EKG:   Orders placed or performed during the hospital encounter of 08/21/15  . EKG 12-Lead  . EKG 12-Lead    ASSESSMENT AND PLAN:   #1 fever/sepsis: Likely due to C. difficile colitis - Influenza negative, urine and blood cultures negative, HIV hepatitis panel negative - Clostridium difficile positive on  oral vancomycin, temperature remains 100  - Bronchoscopy today BAL culture pending, started on meropenem and vancomycin empirically  #2 acute respiratory failure with hypoxia and altered mental status - Pulmonology following, bronchoscopy today with BAL, thick secretions in right airway - Unable to obtain CT angiography due to renal failure. Heparin drip had to be  stopped due to thrombocytopenia  #3 rhabdomyolysis: CK remains critically elevated - Possibly due to prolonged immobilization, hypotension, diarrhea, possible withdrawal seizures prior to admission - Medication effect also possible - Continue hydration, HD, CK decreasing slowly  #4 acute renal failure: Temp HD catheter placed and will have treatments as needed. - Due to ATN, rhabdomyolysis - Appreciate nephrology following - Continue IV fluids and bicarbonate  #5 thrombocytopenia: Improving - Possibly due to sepsis in the setting of C. difficile infection - patient also with history of heavy ETOH use. - stable, no bleeding noted  #6 elevated troponin - No concerning EKG changes, no events on telemetry.  - Heparin drip stopped due to thrombocytopenia - 2-D echocardiogram is normal with no focal wall motion abnormalities - Likely related to rhabdo - appreciate cardiology consultation  #7 altered mental status - Likely metabolic encephalopathy due to infection, also likely alcohol withdrawal - Neurology pending  #8 acute hepatitis: improving - Viral hepatitis panel negative - Some of the AST likely due to rhabdomyolysis - Likely shock liver as well as possible ETOH hepatitis  All the records are reviewed and case discussed with Care Management/Social Workerr. Management plans discussed with the patient, family and they are in agreement.  CODE STATUS: Full   TOTAL CRITICAL CARE TIME TAKING CARE OF THIS PATIENT: 35 minutes.  Greater than 50% of time spent in care coordination and counseling. POSSIBLE D/C IN ? DAYS, DEPENDING ON CLINICAL CONDITION.   Elby Showers M.D on 08/25/2015 at 5:33 PM  Between 7am to 6pm - Pager - [phone]  After 6pm go to [email or web address] - password [email or web address]  [place]  Office  [phone]  CC: Primary care physician; No primary care provider on file.

## 2015-08-25 NOTE — Progress Notes (Signed)
This note also relates to the following rows which could not be included: Temp - Cannot attach notes to unvalidated device data Pulse Rate - Cannot attach notes to unvalidated device data Resp - Cannot attach notes to unvalidated device data SpO2 - Cannot attach notes to unvalidated device data End Tidal CO2 (EtCO2) - Cannot attach notes to unvalidated device data   Pre-hd tx

## 2015-08-25 NOTE — Procedures (Signed)
Date: 08/25/2015, @TIME     PHYSICIAN:  Inaya Gillham  Indications/Preliminary Diagnosis: Respiratory failure, thick secretions, unable to wean from vent, possible aspiration pneumonia.  Consent: (Place X beside choice/s below)  The benefits, risks and possible complications of the procedure were        explained to:  ___ patient  _x__ patient's family  ___ other:___________  who verbalized understanding and gave:  __x_ verbal  ___ written  ___ verbal and written  _x__ telephone  ___ other:________ consent.      Unable to obtain consent; procedure performed on emergent basis.     Other:       PRESEDATION ASSESSMENT: History and Physical has been performed. Patient meds and allergies have been reviewed. Presedation airway examination has been performed and documented. Baseline vital signs, sedation score, oxygenation status, and cardiac rhythm were reviewed. Patient was deemed to be in satisfactory condition to undergo the procedure.  PREMEDICATIONS:   Sedative/Narcotic Amt Dose   Versed  mg   Fentanyl  mcg  Diprivan  mg        Airway Prep (Place X beside choice below)   1% Transtracheal Lidocaine Anesthetization 7 cc   Patient prepped per Bronchoscopy Lab Policy       Insertion Route (Place X beside choice below)   Nasal   Oral   Endotracheal Tube   Tracheostomy   INTRAPROCEDURE MEDICATIONS:  Sedative/Narcotic Amt Dose   Versed  mg   Fentanyl  mcg  Diprivan  mg       Medication Amt Dose  Medication Amt Dose  Xylocaine 2%  cc  Epinephrine 1:10,000 sol  cc  Xylocaine 4%  cc  Cocaine  cc   TECHNICAL PROCEDURES: (Place X beside choice below)   Procedures  Description    None     Electrocautery     Cryotherapy     Balloon Dilatation     Bronchography     Stent Placement     Therapeutic Aspiration     Laser/Argon Plasma    Brachytherapy Catheter Placement    Foreign Body Removal     SPECIMENS (Sites): (Place X beside choice below)  Specimens Description   No Specimens Obtained     Washings   x Lavage RML   Biopsies    Fine Needle Aspirates    Brushings    Sputum    FINDINGS: See below  ESTIMATED BLOOD LOSS: None  COMPLICATIONS/RESOLUTION: None  PROCEDURE DETAILS: Timeout performed and correct patient, name, & ID confirmed. Following prep per Pulmonary policy, appropriate sedation was administered.  Airway exam proceeded with findings, technical procedures, and specimen collection as noted below. At the end of exam the scope was withdrawn without incident. Impression and Plan as noted below.   Inspection: Right Airways-moderate edema and erythema of the airways from the RBI to the right lower lobe. No endobronchial lesions noted. Thick frothy secretions noted throughout examination of right lung. Right middle lobe BAL performed  Left Airways -normal appearing mucosa, no significant secretions noted, no endobronchial lesions.  Procedure: Right middle lobe BAL   IMPLANTED DEVICE(S): None  IMPRESSION:POST-PROCEDURE DX: Pneumonia, pneumonitis  RECOMMENDATION/PLAN: Follow-up BAL    ADDITIONAL COMMENTS: None   Procedure Time: 15 minutes   Stephanie AcreVishal Roquel Burgin, MD [facility] Pager : [phone] (Please enter 7 digits)

## 2015-08-25 NOTE — Consult Note (Signed)
CC: altered mental status   HPI: ROI JAFARI is an 49 y.o. male with a known history of alcohol abuse, schizophrenia, seizure disorder, coronary artery disease status post MI in 2012 resents by EMS after being found unresponsive in his apartment.  Pt found to be septic with multi organ failure.  Pt is s/p intubation.    Still poor exam today   Past Medical History  Diagnosis Date  . Sleep apnea     had surgery to correct it  . Mental disorder     PTSD  . Seizures (HCC)     currently dx with seizures  . Cardiac arrest (HCC) 08/23/2011  . Pneumonia 09/04/2011  . Pancreatitis   . SOB (shortness of breath) 09/11/2011  . Hypertension   . Anxiety   . Myocardial infarction (HCC)   . Schizophrenia (HCC)   . Rhabdomyolysis 08/23/2015  . Acute renal failure (HCC) 08/23/2015    Past Surgical History  Procedure Laterality Date  . Tonsillectomy    . Uvulopalatopharyngoplasty, tonsillectomy and septoplasty  06/16/2000  . Left heart catheterization with coronary angiogram N/A 08/24/2011    Procedure: LEFT HEART CATHETERIZATION WITH CORONARY ANGIOGRAM;  Surgeon: Corky Crafts, MD;  Location: [facility] CATH LAB;  Service: Cardiovascular;  Laterality: N/A;    Family History  Problem Relation Age of Onset  . Heart attack Mother     Social History:  reports that he has quit smoking. His smoking use included Cigarettes. He has never used smokeless tobacco. He reports that he drinks alcohol. He reports that he does not use illicit drugs.  Allergies  Allergen Reactions  . Antihistamines, Diphenhydramine-Type Anaphylaxis and Rash  . Benadryl [Diphenhydramine Hcl] Anaphylaxis and Rash  . Hydroxyzine Anaphylaxis and Rash    Medications: I have reviewed the patient's current medications.  ROS: Unable to obtain   Physical Examination: Blood pressure 123/82, pulse 101, temperature 100.2 F (37.9 C), temperature source Other (Comment), resp. rate 16, height  (1.803 m), weight 239  lb 3.2 oz (108.5 kg), SpO2 100 %.  EOM intact tracks through the room Withdraws from painful stimuli bilaterally  Responds to visual threats   Laboratory Studies:   Basic Metabolic Panel:  Recent Labs Lab 08/21/15 1247 08/22/15 0241 08/23/15 0139 08/23/15 1346 08/24/15 0943 08/25/15 0503  NA 145 143  --  144 143 143  K 3.7 3.5  --  4.2 3.7 3.4*  CL 104 113*  --  107 110 106  CO2 19* 24  --  GLUCOSE 90 137*  --  97 95 95  BUN 38* 33*  --  45* 45* 38*  CREATININE 2.91* 2.03* 3.13* 4.15* 4.68* 4.18*  CALCIUM 9.1 7.2*  --  6.5* 6.6* 7.5*  MG 2.7*  --   --   --   --   --   PHOS  --   --   --  4.7* 3.3 3.7    Liver Function Tests:  Recent Labs Lab 08/21/15 1247 08/22/15 0241 08/23/15 1346 08/24/15 0943 08/25/15 0459 08/25/15 0503  AST >2275* >2275*  --  1274* 1376*  --   ALT 598* 1381*  --  636* 548*  --   ALKPHOS 92 71  --  73 121  --   BILITOT 2.0* 1.3*  --  1.4* 1.1  --   PROT 7.9 6.5  --  5.4* 5.6*  --   ALBUMIN 4.1 3.4* 2.4* 2.4*  2.3* 2.4* 2.3*    Recent  Labs Lab 08/21/15 1247  LIPASE 18   No results for input(s): AMMONIA in the last 168 hours.  CBC:  Recent Labs Lab 08/21/15 1247 08/22/15 0241 08/23/15 0139 08/24/15 0943 08/25/15 0459  WBC 5.8 5.2 5.7 8.5 8.5  NEUTROABS 4.7  --  4.9  --   --   HGB 15.9 14.3 13.6 11.4* 11.4*  HCT 47.2 44.0 41.8 34.3* 34.6*  MCV 90.9 90.4 91.7 90.1 90.8  PLT PLATELET CLUMPS NOTED ON SMEAR, COUNT APPEARS DECREASED 56* 51* 66* 84*    Cardiac Enzymes:  Recent Labs Lab 08/21/15 1247 08/21/15 2031 08/22/15 0240 08/22/15 0904 08/24/15 0943 08/25/15 0459  CKTOTAL 96045*  --   --  40981* 34501* 28484*  TROPONINI 1.56* 1.73* 1.79*  --   --   --     BNP: Invalid input(s): POCBNP  CBG:  Recent Labs Lab 08/24/15 1629 08/24/15 2003 08/25/15 0001 08/25/15 0349 08/25/15 0733  GLUCAP 70 73 68 85 90    Microbiology: Results for orders placed or performed during the hospital encounter of  08/21/15  Blood Culture (routine x 2)     Status: None (Preliminary result)   Collection Time: 08/21/15 12:47 PM  Result Value Ref Range Status   Specimen Description BLOOD UNKNOWN  Final   Special Requests BOTTLES DRAWN AEROBIC AND ANAEROBIC  1CC  Final   Culture NO GROWTH 4 DAYS  Final   Report Status PENDING  Incomplete  Blood Culture (routine x 2)     Status: None (Preliminary result)   Collection Time: 08/21/15 12:47 PM  Result Value Ref Range Status   Specimen Description BLOOD LEFT AC  Final   Special Requests BOTTLES DRAWN AEROBIC AND ANAEROBIC  6CC  Final   Culture NO GROWTH 4 DAYS  Final   Report Status PENDING  Incomplete  Urine culture     Status: None   Collection Time: 08/21/15 12:47 PM  Result Value Ref Range Status   Specimen Description URINE, CATHETERIZED  Final   Special Requests Normal  Final   Culture NO GROWTH 2 DAYS  Final   Report Status 08/23/2015 FINAL  Final  MRSA PCR Screening     Status: None   Collection Time: 08/21/15 11:47 PM  Result Value Ref Range Status   MRSA by PCR NEGATIVE NEGATIVE Final    Comment:        The GeneXpert MRSA Assay (FDA approved for NASAL specimens only), is one component of a comprehensive MRSA colonization surveillance program. It is not intended to diagnose MRSA infection nor to guide or monitor treatment for MRSA infections.   C difficile quick scan w PCR reflex     Status: Abnormal   Collection Time: 08/22/15  2:31 PM  Result Value Ref Range Status   C Diff antigen POSITIVE (A) NEGATIVE Final   C Diff toxin POSITIVE (A) NEGATIVE Final   C Diff interpretation   Final    Positive for toxigenic C. difficile, active toxin production present.    Comment: CRITICAL RESULT CALLED TO, READ BACK BY AND VERIFIED WITH: CHARLIE FLEETWOOD,RN 08/22/2015 1544 BY J RAZZAKSUAREZ,MT     Coagulation Studies: No results for input(s): LABPROT, INR in the last 72 hours.  Urinalysis:   Recent Labs Lab 08/23/15 0451  08/23/15 0522  COLORURINE AMBER* AMBER*  LABSPEC 1.013 1.017  PHURINE 5.0 5.0  GLUCOSEU NEGATIVE 50*  HGBUR 3+* 3+*  BILIRUBINUR NEGATIVE NEGATIVE  KETONESUR TRACE* TRACE*  PROTEINUR 100* 100*  NITRITE NEGATIVE NEGATIVE  LEUKOCYTESUR NEGATIVE TRACE*    Lipid Panel:     Component Value Date/Time   CHOL 259* 09/05/2011 0535   TRIG 131 09/05/2011 0535   HDL 50 09/05/2011 0535   CHOLHDL 5.2 09/05/2011 0535   VLDL 26 09/05/2011 0535   LDLCALC 183* 09/05/2011 0535    HgbA1C:  Lab Results  Component Value Date   HGBA1C 5.2 08/21/2015    Urine Drug Screen:      Component Value Date/Time   LABOPIA NONE DETECTED 08/21/2015 1247   LABOPIA NONE DETECTED 03/27/2015 2206   COCAINSCRNUR NONE DETECTED 03/27/2015 2206   LABBENZ POSITIVE* 08/21/2015 1247   LABBENZ NONE DETECTED 03/27/2015 2206   AMPHETMU NONE DETECTED 08/21/2015 1247   AMPHETMU NONE DETECTED 03/27/2015 2206   THCU POSITIVE* 08/21/2015 1247   THCU NONE DETECTED 03/27/2015 2206   LABBARB NONE DETECTED 08/21/2015 1247   LABBARB NONE DETECTED 03/27/2015 2206    Alcohol Level:   Recent Labs Lab 08/21/15 1414  ETH <5     Imaging: Dg Chest 1 View  08/23/2015  CLINICAL DATA:  Central line placed on the right. EXAM: CHEST 1 VIEW COMPARISON:  08/21/2015. FINDINGS: Normal sized heart. Clear lungs. Endotracheal tube in satisfactory position. Nasogastric tube tip in the proximal stomach. Interval right jugular catheter with its tip in the upper right atrium or superior cavoatrial junction and. No pneumothorax. Unremarkable bones. IMPRESSION: 1. Right jugular catheter tip in the upper right atrium more superior cavoatrial junction without pneumothorax. 2. No acute abnormality. Electronically Signed   By: Beckie SaltsSteven  Reid M.D.   On: 08/23/2015 11:20     Assessment/Plan: 49 y.o. male with a known history of alcohol abuse, schizophrenia, seizure disorder, coronary artery disease status post MI in 2012 resents by EMS after  being found unresponsive in his apartment.  Pt found to be septic with multi organ failure.  Pt is s/p intubation.    No exam change   Since yesterday  EEG no seizure activity. He does follow intermittent commands Agitation when prolonged period being off sedatinon Would condsider precedex if needed, due to poor clearance of longer acting agents No anti epileptics  Call with questions.      Pauletta BrownsZEYLIKMAN, Sofie Schendel  08/25/2015, 10:11 AM

## 2015-08-25 NOTE — Progress Notes (Signed)
ANTIBIOTIC CONSULT NOTE - INITIAL  Pharmacy Consult for Vancomycin/Meropenem Dosing Indication: rule out pneumonia  Allergies  Allergen Reactions  . Antihistamines, Diphenhydramine-Type Anaphylaxis and Rash  . Benadryl [Diphenhydramine Hcl] Anaphylaxis and Rash  . Hydroxyzine Anaphylaxis and Rash    Patient Measurements: Height:  (180.3 cm) Weight: 244 lb 4.3 oz (110.8 kg) IBW/kg (Calculated) : 75.3 Adjusted Body Weight: 89kg  Vital Signs: Temp: 100.8 F (38.2 C) (11/08 1317) BP: 122/77 mmHg (11/08 1317) Pulse Rate: 83 (11/08 1317) Intake/Output from previous day: 11/07 0701 - 11/08 0700 In: 4457.6 [I.V.:4300.8; NG/GT:156.8] Out: 2124 [Urine:624] Intake/Output from this shift: Total I/O In: 1969.2 [I.V.:929.2; NG/GT:1040] Out: 1720 [Urine:220; Other:1500]  Labs:  Recent Labs  08/23/15 0139 08/23/15 1346 08/24/15 0943 08/25/15 0459 08/25/15 0503  WBC 5.7  --  8.5 8.5  --   HGB 13.6  --  11.4* 11.4*  --   PLT 51*  --  66* 84*  --   CREATININE 3.13* 4.15* 4.68*  --  4.18*   Estimated Creatinine Clearance: 27.7 mL/min (by C-G formula based on Cr of 4.18). No results for input(s): VANCOTROUGH, VANCOPEAK, VANCORANDOM, GENTTROUGH, GENTPEAK, GENTRANDOM, TOBRATROUGH, TOBRAPEAK, TOBRARND, AMIKACINPEAK, AMIKACINTROU, AMIKACIN in the last 72 hours.   Microbiology: Recent Results (from the past 720 hour(s))  Blood Culture (routine x 2)     Status: None (Preliminary result)   Collection Time: 08/21/15 12:47 PM  Result Value Ref Range Status   Specimen Description BLOOD UNKNOWN  Final   Special Requests BOTTLES DRAWN AEROBIC AND ANAEROBIC  1CC  Final   Culture NO GROWTH 4 DAYS  Final   Report Status PENDING  Incomplete  Blood Culture (routine x 2)     Status: None (Preliminary result)   Collection Time: 08/21/15 12:47 PM  Result Value Ref Range Status   Specimen Description BLOOD LEFT AC  Final   Special Requests BOTTLES DRAWN AEROBIC AND ANAEROBIC  6CC  Final   Culture NO GROWTH 4 DAYS  Final   Report Status PENDING  Incomplete  Urine culture     Status: None   Collection Time: 08/21/15 12:47 PM  Result Value Ref Range Status   Specimen Description URINE, CATHETERIZED  Final   Special Requests Normal  Final   Culture NO GROWTH 2 DAYS  Final   Report Status 08/23/2015 FINAL  Final  MRSA PCR Screening     Status: None   Collection Time: 08/21/15 11:47 PM  Result Value Ref Range Status   MRSA by PCR NEGATIVE NEGATIVE Final    Comment:        The GeneXpert MRSA Assay (FDA approved for NASAL specimens only), is one component of a comprehensive MRSA colonization surveillance program. It is not intended to diagnose MRSA infection nor to guide or monitor treatment for MRSA infections.   C difficile quick scan w PCR reflex     Status: Abnormal   Collection Time: 08/22/15  2:31 PM  Result Value Ref Range Status   C Diff antigen POSITIVE (A) NEGATIVE Final   C Diff toxin POSITIVE (A) NEGATIVE Final   C Diff interpretation   Final    Positive for toxigenic C. difficile, active toxin production present.    Comment: CRITICAL RESULT CALLED TO, READ BACK BY AND VERIFIED WITH: CHARLIE FLEETWOOD,RN 08/22/2015 1544 BY J RAZZAKSUAREZ,MT     Medical History: Past Medical History  Diagnosis Date  . Sleep apnea     had surgery to correct it  . Mental disorder  PTSD  . Seizures (HCC)     currently dx with seizures  . Cardiac arrest (HCC) 08/23/2011  . Pneumonia 09/04/2011  . Pancreatitis   . SOB (shortness of breath) 09/11/2011  . Hypertension   . Anxiety   . Myocardial infarction (HCC)   . Schizophrenia (HCC)   . Rhabdomyolysis 08/23/2015  . Acute renal failure (HCC) 08/23/2015    Medications:  Scheduled:  . antiseptic oral rinse  7 mL Mouth Rinse QID  . chlorhexidine gluconate  15 mL Mouth Rinse BID  . famotidine  20 mg Oral Q24H  . feeding supplement (VITAL HIGH PROTEIN)  1,000 mL Per Tube Q24H  . free water  100 mL Per Tube 3 times  per day  . insulin aspart  0-5 Units Subcutaneous QHS  . insulin aspart  0-9 Units Subcutaneous TID WC  . meropenem (MERREM) IV  1 g Intravenous Q12H  . vancomycin  125 mg Oral 4 times per day  . vancomycin  1,000 mg Intravenous Q24H   Infusions:  . sodium chloride 150 mL/hr at 08/25/15 1103  . dexmedetomidine 1 mcg/kg/hr (08/25/15 1357)  . fentaNYL infusion INTRAVENOUS Stopped (08/25/15 0900)  . midazolam (VERSED) infusion Stopped (08/25/15 0900)   Assessment: Pharmacy consulted to dose vancomycin and meropenem for concern of right-sided pneumonia/pneumonitis-may have had aspiration when found down, will start on meropenem and IV Vanco (currently being treated for C. Difficile).    Goal of Therapy:  Vancomycin trough level 15-20 mcg/ml  Plan:  Patient with acute renal failure, will not stack dose. Will start patient on vancomycin 1g IV q24hr for goal trough of 15-20. Will continue to monitor renal function daily.   Will start patient on meropenem 1g IV Q12hr.   Pharmacy will continue to monitor and adjust per consult.    Lydia Toren L 08/25/2015,3:47 PM

## 2015-08-25 NOTE — Progress Notes (Signed)
Pre-hd tx 

## 2015-08-25 NOTE — Progress Notes (Signed)
Nutrition Follow-up     INTERVENTION:   EN: recommend continuing current TF regimen as per order   NUTRITION DIAGNOSIS:   Inadequate oral intake related to acute illness as evidenced by NPO status.  GOAL:   Provide needs based on ASPEN/SCCM guidelines  MONITOR:    (Energy intake, Digestive system, Electrolyte and renal profile)  REASON FOR ASSESSMENT:   Ventilator    ASSESSMENT:   Pt remains on vent, plan for 3rd HD treamtent today, started on precedex  Diet Order:  Diet NPO time specified  EN: tolerating Vital High Protein at goal of 45 ml/hr  Digestive System: no signs of TF intolerance, Cdiff positive  Electrolyte and Renal Profile:  Recent Labs Lab 08/21/15 1247  08/23/15 1346 08/24/15 0943 08/25/15 0503  BUN 38*  < > 45* 45* 38*  CREATININE 2.91*  < > 4.15* 4.68* 4.18*  NA 145  < > 144 143 143  K 3.7  < > 4.2 3.7 3.4*  MG 2.7*  --   --   --   --   PHOS  --   --  4.7* 3.3 3.7  < > = values in this interval not displayed. Glucose Profile:  Recent Labs  08/25/15 0349 08/25/15 0733 08/25/15 1113  GLUCAP 85 90 112*   Meds:    NS at 150 ml/hr, ss novolog  Height:   Ht Readings from Last 1 Encounters:  08/21/15 5\' 11"  (1.803 m)    Weight:   Wt Readings from Last 1 Encounters:  08/25/15 244 lb 4.3 oz (110.8 kg)    Filed Weights   08/23/15 1345 08/25/15 0634 08/25/15 0945  Weight: 234 lb 2.1 oz (106.2 kg) 239 lb 3.2 oz (108.5 kg) 244 lb 4.3 oz (110.8 kg)    BMI:  Body mass index is 34.08 kg/(m^2).  Estimated Nutritional Needs:   Kcal:  (11/14 kcals/kg) [id number] kcals/d  Protein:  (156-195 g/kg) (2.0-2.5 g/kg Using IBW of 78kg)  Fluid:  (25-3730ml/kg) 1950-239440ml/d  EDUCATION NEEDS:   No education needs identified at this time  HIGH Care Level  Romelle Starcherate Ashna Dorough MS, RD, LDN [phone] Pager

## 2015-08-25 NOTE — Consult Note (Signed)
[facility] Consultation      Name: Ross [name] MRN: [id number] DOB: [birth year]    ADMISSION DATE:  08/21/2015   CHIEF COMPLAINT:    acute resp failure   SUBJECTIVE  Patient was intubated, placed on full vent support. Currently on sedation, not on vasopressors.  Will follow simple commands (blink eyes, squeeze hands) Failed vent wean due to acidosis on 11/6, trying sbt today, again. Thick moderate secretions from ET tube. Had bronchoscopy with thick moderate secretions noted in the right Airways, BAL of right middle lobe. Creatinine worsening, on IVFs, will get another session of HD today.     SIGNIFICANT EVENTS   11/4 intubated 11/5 failed vent wean due to encephalopathy and acidosis 11/6 RIJ CVL, R Fem Vein VasCath 11/8 bronchoscopy with BAL   Review of Systems  Unable to perform ROS: critical illness      VITAL SIGNS    Temp:  [93.7 F (34.3 C)-100.8 F (38.2 C)] 100.8 F (38.2 C) (11/08 1317) Pulse Rate:  [82-101] 83 (11/08 1317) Resp:  [13-24] 17 (11/08 1317) BP: (113-134)/(69-104) 122/77 mmHg (11/08 1317) SpO2:  [96 %-100 %] 100 % (11/08 1317) FiO2 (%):  [35 %] 35 % (11/08 1223) Weight:  [239 lb 3.2 oz (108.5 kg)-244 lb 4.3 oz (110.8 kg)] 244 lb 4.3 oz (110.8 kg) (11/08 0945) HEMODYNAMICS:   VENTILATOR SETTINGS: Vent Mode:  [-] PSV FiO2 (%):  [35 %] 35 % Set Rate:  [16 bmp] 16 bmp Vt Set:  [550 mL] 550 mL PEEP:  [5 cmH20] 5 cmH20 Pressure Support:  [10 cmH20] 10 cmH20 INTAKE / OUTPUT:  Intake/Output Summary (Last 24 hours) at 08/25/15 1530 Last data filed at 08/25/15 1400  Gross per 24 hour  Intake 5182.85 ml  Output   3701 ml  Net 1481.85 ml       PHYSICAL EXAM   Physical Exam  Constitutional: No distress.  HENT:  Head: Normocephalic and atraumatic.  Eyes: Pupils are equal, round, and reactive to light. No scleral icterus.  Neck: Normal range of motion. Neck supple.  Cardiovascular: Normal rate and  regular rhythm.   No murmur heard. Pulmonary/Chest: No respiratory distress. He has no wheezes. He has rales.  resp distress  Abdominal: Soft. He exhibits no distension. There is no tenderness.  Musculoskeletal: He exhibits no edema.  Neurological:  gcs<10T  Skin: Skin is warm. He is diaphoretic.       LABS   LABS:  CBC  Recent Labs Lab 08/23/15 0139 08/24/15 0943 08/25/15 0459  WBC 5.7 8.5 8.5  HGB 13.6 11.4* 11.4*  HCT 41.8 34.3* 34.6*  PLT 51* 66* 84*   Coag's  Recent Labs Lab 08/21/15 1247  APTT 28  INR 1.09   BMET  Recent Labs Lab 08/23/15 1346 08/24/15 0943 08/25/15 0503  NA 144 143 143  K 4.2 3.7 3.4*  CL 107 110 106  CO2 _0 BUN 45* 45* 38*  CREATININE 4.15* 4.68* 4.18*  GLUCOSE 97 95 95   Electrolytes  Recent Labs Lab 08/21/15 1247  08/23/15 1346 08/24/15 0943 08/25/15 0503  CALCIUM 9.1  < > 6.5* 6.6* 7.5*  MG 2.7*  --   --   --   --   PHOS  --   --  4.7* 3.3 3.7  < > = values in this interval not displayed. Sepsis Markers  Recent Labs Lab 08/22/15 0022 08/22/15 0240 08/22/15 0550  LATICACIDVEN 2.1* 2.1* 2.0   ABG  Recent Labs Lab 08/21/15 2002 08/22/15 1010 08/23/15 0920  PHART 7.33* 7.34* 7.35  PCO2ART 41 44 41  PO2ART 142* 103 124*   Liver Enzymes  Recent Labs Lab 08/22/15 0241  08/24/15 0943 08/25/15 0459 08/25/15 0503  AST >2275*  --  1274* 1376*  --   ALT 1381*  --  636* 548*  --   ALKPHOS 71  --  73 121  --   BILITOT 1.3*  --  1.4* 1.1  --   ALBUMIN 3.4*  < > 2.4*  2.3* 2.4* 2.3*  < > = values in this interval not displayed. Cardiac Enzymes  Recent Labs Lab 08/21/15 1247 08/21/15 2031 08/22/15 0240  TROPONINI 1.56* 1.73* 1.79*   Glucose  Recent Labs Lab 08/24/15 1629 08/24/15 2003 08/25/15 0001 08/25/15 0349 08/25/15 0733 08/25/15 1113  GLUCAP 70 73 68 85 90 112*     Recent Results (from the past 240 hour(s))  Blood Culture (routine x 2)     Status: None (Preliminary  result)   Collection Time: 08/21/15 12:47 PM  Result Value Ref Range Status   Specimen Description BLOOD UNKNOWN  Final   Special Requests BOTTLES DRAWN AEROBIC AND ANAEROBIC  1CC  Final   Culture NO GROWTH 4 DAYS  Final   Report Status PENDING  Incomplete  Blood Culture (routine x 2)     Status: None (Preliminary result)   Collection Time: 08/21/15 12:47 PM  Result Value Ref Range Status   Specimen Description BLOOD LEFT AC  Final   Special Requests BOTTLES DRAWN AEROBIC AND ANAEROBIC  6CC  Final   Culture NO GROWTH 4 DAYS  Final   Report Status PENDING  Incomplete  Urine culture     Status: None   Collection Time: 08/21/15 12:47 PM  Result Value Ref Range Status   Specimen Description URINE, CATHETERIZED  Final   Special Requests Normal  Final   Culture NO GROWTH 2 DAYS  Final   Report Status 08/23/2015 FINAL  Final  MRSA PCR Screening     Status: None   Collection Time: 08/21/15 11:47 PM  Result Value Ref Range Status   MRSA by PCR NEGATIVE NEGATIVE Final    Comment:        The GeneXpert MRSA Assay (FDA approved for NASAL specimens only), is one component of a comprehensive MRSA colonization surveillance program. It is not intended to diagnose MRSA infection nor to guide or monitor treatment for MRSA infections.   C difficile quick scan w PCR reflex     Status: Abnormal   Collection Time: 08/22/15  2:31 PM  Result Value Ref Range Status   C Diff antigen POSITIVE (A) NEGATIVE Final   C Diff toxin POSITIVE (A) NEGATIVE Final   C Diff interpretation   Final    Positive for toxigenic C. difficile, active toxin production present.    Comment: CRITICAL RESULT CALLED TO, READ BACK BY AND VERIFIED WITH: CHARLIE FLEETWOOD,RN 08/22/2015 1544 BY J RAZZAKSUAREZ,MT      Current facility-administered medications:  .  0.9 %  sodium chloride infusion, , Intravenous, Continuous, Flora Lipps, MD, Last Rate: 150 mL/hr at 08/25/15 1103 .  acetaminophen (TYLENOL) tablet 650 mg, 650  mg, Oral, Q6H PRN **OR** acetaminophen (TYLENOL) suppository 650 mg, 650 mg, Rectal, Q6H PRN, Tirzah Fross, MD .  antiseptic oral rinse solution (CORINZ), 7 mL, Mouth Rinse, QID, Aldean Jewett, MD, 7 mL at 08/25/15 0432 .  chlorhexidine gluconate (PERIDEX) 0.12 % solution 15 mL,  15 mL, Mouth Rinse, BID, Aldean Jewett, MD, 15 mL at 08/25/15 0817 .  dexmedetomidine (PRECEDEX) 400 MCG/100ML (4 mcg/mL) infusion, 0.4-1.2 mcg/kg/hr, Intravenous, Titrated, Leotis Pain, MD, Last Rate: 27.1 mL/hr at 08/25/15 1357, 1 mcg/kg/hr at 08/25/15 1357 .  famotidine (PEPCID) tablet 20 mg, 20 mg, Oral, Q24H, Kilea Mccarey, MD, 20 mg at 08/24/15 1752 .  feeding supplement (VITAL HIGH PROTEIN) liquid 1,000 mL, 1,000 mL, Per Tube, Q24H, Amear Strojny, MD, 1,000 mL at 08/25/15 1500 .  fentaNYL 2576mg in NS 2553m(1027mml) infusion-PREMIX, 100 mcg/hr, Intravenous, Continuous, CorHinda KehrD, Stopped at 08/25/15 0900 .  free water 100 mL, 100 mL, Per Tube, 3 times per day, Barabara Motz, MD, 100 mL at 08/25/15 1400 .  insulin aspart (novoLOG) injection 0-5 Units, 0-5 Units, Subcutaneous, QHS, CatAldean JewettD, 0 Units at 08/21/15 2318 .  insulin aspart (novoLOG) injection 0-9 Units, 0-9 Units, Subcutaneous, TID WC, CatAldean JewettD, 1 Units at 08/22/15 [phone] midazolam (VERSED) 50 mg in sodium chloride 0.9 % 50 mL (1 mg/mL) infusion, 1 mg/hr, Intravenous, Continuous, CorHinda KehrD, Stopped at 08/25/15 0900 .  vancomycin (VANCOCIN) 50 mg/mL oral solution 125 mg, 125 mg, Oral, 4 times per day, VisVilinda BoehringerD, 125 mg at 08/25/15 1357  IMAGING    Dg Chest Port 1 View  08/25/2015  CLINICAL DATA:  Respiratory failure, intubated. EXAM: PORTABLE CHEST 1 VIEW COMPARISON:  08/23/2015 FINDINGS: Endotracheal tube, NG tube and right central line remain in place, unchanged. Low lung volumes with increasing vascular congestion. No overt edema. Heart is upper limits normal in size. No visible effusions.  IMPRESSION: Decreasing lung volumes with increasing vascular congestion. Electronically Signed   By: KevRolm BaptiseD.   On: 08/25/2015 11:57      Indwelling Urinary Catheter continued, requirement due to   Reason to continue Indwelling Urinary Catheter for strict Intake/Output monitoring for hemodynamic instability         Ventilator continued, requirement due to, resp failure    Ventilator Sedation RASS 0 to -2   MAJOR EVENTS/TEST RESULTS: 11/4 intubated 11/5 failed vent wean   INDWELLING DEVICES:: 11/6 RIJ CVL, R Fem Vein VasCath>>  MICRO DATA: MRSA PCR >>neg Urine 11/4>>negative Blood x 2 11/4>>No growth to date Resp BAL 11/8>> FLU H1N1>> NEG c diff + (antigend and toxin) 11/5  ANTIMICROBIALS:  Oral vanc>>11/5 Flagy>>11/5    ASSESSMENT/PLAN   [age] AAM with acute resp failure with acute encephalopathy from acute sepsis from acute recurrent c diff colitis With acute renal failure from rhabdomyolysis with acute drug abuse with etoh abuse  PULMONARY-failed vent wean due to encephalopathy and acidosis -Respiratory Failure -continue Full MV support -continue Bronchodilator Therapy -Wean Fio2 and PEEP as tolerated -Thick secretions and difficult weaning, now status post bronchoscopy with BAL, moderate thick secretions and right airway. Follow-up BAL -Concern for right-sided pneumonia/pneumonitis-may have had aspiration when found down, will start on meropenem and IV Vanco (currently being treated for C. Difficile) -will perform SAT/SBT when respiratory parameters are met  CARDIOVASCULAR -blood pressure stable -not on vasopressors -IVF's  RENAL-Rhabdomyolysis Acute renal failure from ATN -currently on intermittent HD -trend and follow CK levels. (trending down)   GASTROINTESTINAL GI prophylaxis  HEMATOLOGIC Decreased PLT count - 66 today -stop heparin infusion   INFECTIOUS + cdiff -oral vancomycin and flagyl  ENDOCRINE - ICU  hypoglycemic\Hyperglycemia protocol   NEUROLOGIC - intubated and sedated - minimal sedation to achieve a RASS goal: -1  TTP  less likely at this time: continue current care and management  I have personally obtained a history, examined the patient, evaluated laboratory and independently reviewed  imaging results, formulated the assessment and plan and placed orders.  The Patient requires high complexity decision making for assessment and support, frequent evaluation and titration of therapies, application of advanced monitoring technologies and extensive interpretation of multiple databases. Critical Care Time devoted to patient care services described in this note is 45 minutes.   Overall, patient is critically ill, prognosis is guarded. Patient at high risk for cardiac arrest and death.   Vilinda Boehringer, MD [facility] Pager [phone] (please enter 7-digits) On Call Pager - [phone] (please enter 7-digits)

## 2015-08-25 NOTE — Progress Notes (Signed)
Post hd tx 

## 2015-08-26 ENCOUNTER — Inpatient Hospital Stay: Payer: Medicare Other

## 2015-08-26 ENCOUNTER — Encounter: Payer: Self-pay | Admitting: Respiratory Therapy

## 2015-08-26 HISTORY — PX: INTUBATION: PRO89

## 2015-08-26 LAB — RENAL FUNCTION PANEL
ALBUMIN: 2.1 g/dL — AB (ref 3.5–5.0)
ANION GAP: 7 (ref 5–15)
BUN: 36 mg/dL — ABNORMAL HIGH (ref 6–20)
CALCIUM: 8.1 mg/dL — AB (ref 8.9–10.3)
CO2: 28 mmol/L (ref 22–32)
CREATININE: 3.89 mg/dL — AB (ref 0.61–1.24)
Chloride: 107 mmol/L (ref 101–111)
GFR calc non Af Amer: 17 mL/min — ABNORMAL LOW (ref >60)
GFR, EST AFRICAN AMERICAN: 20 mL/min — AB (ref >60)
GLUCOSE: 132 mg/dL — AB (ref 65–99)
PHOSPHORUS: 2.9 mg/dL (ref 2.5–4.6)
Potassium: 3.1 mmol/L — ABNORMAL LOW (ref 3.5–5.1)
SODIUM: 142 mmol/L (ref 135–145)

## 2015-08-26 LAB — HEPATIC FUNCTION PANEL
ALT: 411 U/L — AB (ref 17–63)
AST: 940 U/L — ABNORMAL HIGH (ref 15–41)
Albumin: 2.1 g/dL — ABNORMAL LOW (ref 3.5–5.0)
Alkaline Phosphatase: 375 U/L — ABNORMAL HIGH (ref 38–126)
BILIRUBIN DIRECT: 0.3 mg/dL (ref 0.1–0.5)
BILIRUBIN INDIRECT: 0.3 mg/dL (ref 0.3–0.9)
TOTAL PROTEIN: 5.6 g/dL — AB (ref 6.5–8.1)
Total Bilirubin: 0.6 mg/dL (ref 0.3–1.2)

## 2015-08-26 LAB — MAGNESIUM: MAGNESIUM: 2 mg/dL (ref 1.7–2.4)

## 2015-08-26 LAB — CBC
HCT: 34.8 % — ABNORMAL LOW (ref 40.0–52.0)
HEMOGLOBIN: 11.4 g/dL — AB (ref 13.0–18.0)
MCH: 29.4 pg (ref 26.0–34.0)
MCHC: 32.8 g/dL (ref 32.0–36.0)
MCV: 89.7 fL (ref 80.0–100.0)
PLATELETS: 97 10*3/uL — AB (ref 150–440)
RBC: 3.88 MIL/uL — AB (ref 4.40–5.90)
RDW: 14.3 % (ref 11.5–14.5)
WBC: 10.2 10*3/uL (ref 3.8–10.6)

## 2015-08-26 LAB — GLUCOSE, CAPILLARY
GLUCOSE-CAPILLARY: 115 mg/dL — AB (ref 65–99)
GLUCOSE-CAPILLARY: 97 mg/dL (ref 65–99)
GLUCOSE-CAPILLARY: 123 mg/dL — AB (ref 65–99)
Glucose-Capillary: 123 mg/dL — ABNORMAL HIGH (ref 65–99)
Glucose-Capillary: 114 mg/dL — ABNORMAL HIGH (ref 65–99)

## 2015-08-26 LAB — CK: CK TOTAL: 14055 U/L — AB (ref 49–397)

## 2015-08-26 LAB — POTASSIUM: POTASSIUM: 3.6 mmol/L (ref 3.5–5.1)

## 2015-08-26 MED ORDER — VANCOMYCIN HCL IN DEXTROSE 1-5 GM/200ML-% IV SOLN
1000.0000 mg | Freq: Once | INTRAVENOUS | Status: AC
  Administered 2015-08-26: 1000 mg via INTRAVENOUS
  Filled 2015-08-26: qty 200

## 2015-08-26 MED ORDER — METHYLPREDNISOLONE SODIUM SUCC 40 MG IJ SOLR
40.0000 mg | Freq: Four times a day (QID) | INTRAMUSCULAR | Status: DC
  Administered 2015-08-26 – 2015-08-28 (×9): 40 mg via INTRAVENOUS
  Filled 2015-08-26 (×9): qty 1

## 2015-08-26 MED ORDER — VANCOMYCIN HCL 500 MG IV SOLR
500.0000 mg | Freq: Four times a day (QID) | Status: DC
  Filled 2015-08-26 (×9): qty 500

## 2015-08-26 MED ORDER — VECURONIUM BROMIDE 10 MG IV SOLR
INTRAVENOUS | Status: AC
  Filled 2015-08-26: qty 10

## 2015-08-26 MED ORDER — VANCOMYCIN HCL IN DEXTROSE 1-5 GM/200ML-% IV SOLN: 1000.0000 mg | INTRAVENOUS | Status: DC

## 2015-08-26 MED ORDER — RACEPINEPHRINE HCL 2.25 % IN NEBU
0.5000 mL | INHALATION_SOLUTION | Freq: Once | RESPIRATORY_TRACT | Status: AC
  Administered 2015-08-26: 0.5 mL via RESPIRATORY_TRACT
  Filled 2015-08-26: qty 0.5

## 2015-08-26 MED ORDER — POTASSIUM CHLORIDE 10 MEQ/100ML IV SOLN
10.0000 meq | INTRAVENOUS | Status: AC
  Administered 2015-08-26 (×5): 10 meq via INTRAVENOUS
  Filled 2015-08-26 (×5): qty 100

## 2015-08-26 MED ORDER — SODIUM CHLORIDE 0.9 % IV SOLN
1.0000 g | INTRAVENOUS | Status: DC
  Filled 2015-08-26: qty 1

## 2015-08-26 MED ORDER — IPRATROPIUM-ALBUTEROL 0.5-2.5 (3) MG/3ML IN SOLN
3.0000 mL | RESPIRATORY_TRACT | Status: DC | PRN
  Filled 2015-08-26: qty 3

## 2015-08-26 MED ORDER — SUCCINYLCHOLINE CHLORIDE 20 MG/ML IJ SOLN
100.0000 mg | Freq: Once | INTRAMUSCULAR | Status: AC
  Administered 2015-08-26: 100 mg via INTRAVENOUS

## 2015-08-26 MED ORDER — RACEPINEPHRINE HCL 2.25 % IN NEBU
0.5000 mL | INHALATION_SOLUTION | RESPIRATORY_TRACT | Status: DC | PRN
  Administered 2015-08-26: 0.5 mL via RESPIRATORY_TRACT
  Filled 2015-08-26 (×2): qty 0.5

## 2015-08-26 MED ORDER — ETOMIDATE 2 MG/ML IV SOLN
0.3000 mg/kg | Freq: Once | INTRAVENOUS | Status: AC
  Administered 2015-08-26: 20 mg via INTRAVENOUS

## 2015-08-26 MED ORDER — IPRATROPIUM-ALBUTEROL 0.5-2.5 (3) MG/3ML IN SOLN: 3.0000 mL | RESPIRATORY_TRACT | Status: DC | PRN

## 2015-08-26 NOTE — Progress Notes (Signed)
[facility]  at [facility]   PATIENT NAME: Ross [name]    MR#:  [id number]  DATE OF BIRTH:  06-Jun-1966  SUBJECTIVE:  CHIEF COMPLAINT:   Chief Complaint  Patient presents with  . Seizures   Self extubated prior to examination. Speaks in a weak voice, weak cough. Calm  REVIEW OF SYSTEMS:   ROS unable to obtain patient unable to speak  DRUG ALLERGIES:   Allergies  Allergen Reactions  . Antihistamines, Diphenhydramine-Type Anaphylaxis and Rash  . Benadryl [Diphenhydramine Hcl] Anaphylaxis and Rash  . Hydroxyzine Anaphylaxis and Rash    VITALS:  Blood pressure 114/71, pulse 71, temperature 101.1 F (38.4 C), temperature source Other (Comment), resp. rate 15, height 5\' 11"  (1.803 m), weight 114.9 kg (253 lb 4.9 oz), SpO2 98 %.  PHYSICAL EXAMINATION:  GENERAL:  49 y.o.-year-old patient lying in the bed, critically ill appearing, diaphoretic,  LUNGS: diffuse rhonchi, weak cough, short shallow respirations CARDIOVASCULAR: S1, S2 normal. No murmurs, rubs, or gallops.  ABDOMEN: Soft, nontender, nondistended. Bowel sounds present. No organomegaly or mass.  EXTREMITIES: No pedal edema, cyanosis, or clubbing. Peripheral pulses 2+ NEUROLOGIC: alert, CN's grossly intact, moves all extremities, oriented to person, follows comands SKIN: No obvious rash, lesion, or ulcer.    LABORATORY PANEL:   CBC  Recent Labs Lab 08/26/15 0515  WBC 10.2  HGB 11.4*  HCT 34.8*  PLT 97*   ------------------------------------------------------------------------------------------------------------------  Chemistries   Recent Labs Lab 08/21/15 1247  08/26/15 0515  NA 145  < > 142  K 3.7  < > 3.1*  CL 104  < > 107  CO2 19*  < > 28  GLUCOSE 90  < > 132*  BUN 38*  < > 36*  CREATININE 2.91*  < > 3.89*  CALCIUM 9.1  < > 8.1*  MG 2.7*  --   --   AST >2275*  < > 940*  ALT 598*  < > 411*  ALKPHOS 92  < > 375*  BILITOT 2.0*  < > 0.6  < > = values in  this interval not displayed. ------------------------------------------------------------------------------------------------------------------  Cardiac Enzymes  Recent Labs Lab 08/22/15 0240  TROPONINI 1.79*   ------------------------------------------------------------------------------------------------------------------  RADIOLOGY:  Dg Abd 1 View  08/26/2015  CLINICAL DATA:  Nasogastric tube placement EXAM: ABDOMEN - 1 VIEW COMPARISON:  07/21/2015 FINDINGS: Motion degraded study showing nasogastric tube tip at the proximal stomach. Dialysis catheter by right femoral approach, tip at the L2-3 disc level. Moderate gaseous distension of bowel with nonobstructive pattern. IMPRESSION: Nasogastric tube tip at the proximal stomach. Electronically Signed   By: Marnee SpringJonathon  Watts M.D.   On: 08/26/2015 05:47   Dg Chest Port 1 View  08/26/2015  CLINICAL DATA:  Self extubated EXAM: PORTABLE CHEST 1 VIEW COMPARISON:  Yesterday FINDINGS: Replaced endotracheal tube tip in good position between clavicular heads and carina. Right IJ central line remains in good position, tip at the upper right atrium. An orogastric tube at least reaches the stomach. New hazy opacity at the bases with bandlike atelectasis along the right minor fissure. No overt edema. No effusion or pneumothorax. IMPRESSION: 1. Replaced endotracheal tube in good position. 2. New basilar lung opacities favor atelectasis or aspiration given the circumstances. Electronically Signed   By: Marnee SpringJonathon  Watts M.D.   On: 08/26/2015 05:46   Dg Chest Port 1 View  08/25/2015  CLINICAL DATA:  Respiratory failure, intubated. EXAM: PORTABLE CHEST 1 VIEW COMPARISON:  08/23/2015 FINDINGS: Endotracheal tube, NG tube and right  central line remain in place, unchanged. Low lung volumes with increasing vascular congestion. No overt edema. Heart is upper limits normal in size. No visible effusions. IMPRESSION: Decreasing lung volumes with increasing vascular congestion.  Electronically Signed   By: Charlett Nose M.D.   On: 08/25/2015 11:57    EKG:   Orders placed or performed during the hospital encounter of 08/21/15  . EKG 12-Lead  . EKG 12-Lead    ASSESSMENT AND PLAN:   #1 fever/sepsis: Likely due to C. difficile colitis - Influenza negative, urine and blood cultures negative, HIV hepatitis panel negative - Clostridium difficile positive on oral vancomycin, temperature remains 100  - Bronchoscopy today BAL culture pending, UA on 11/6 positive, started on meropenem and vancomycin empirically - fever may also be related to ETOH withdrawal   #2 acute respiratory failure with hypoxia and altered mental status - Pulmonology following, bronchoscopy 11/8 with BAL, thick secretions in right airway - Unable to obtain CT angiography due to renal failure. Heparin drip had to be stopped due to thrombocytopenia  #3 rhabdomyolysis: CK trending down - Possibly due to prolonged immobilization, hypotension, diarrhea, possible withdrawal seizures prior to admission - Medication effect also possible - Continue hydration, HD, CK decreasing slowly  #4 acute renal failure: Temp HD catheter placed and will have treatments as needed. - Due to ATN, rhabdomyolysis - Appreciate nephrology following - Continue IV fluids and bicarbonate  #5 thrombocytopenia: Improving - Possibly due to sepsis in the setting of C. difficile infection - patient also with history of heavy ETOH use. - stable, no bleeding noted  #6 elevated troponin - No concerning EKG changes, no events on telemetry.  - Heparin drip stopped due to thrombocytopenia - 2-D echocardiogram is normal with no focal wall motion abnormalities - Likely related to rhabdo - appreciate cardiology consultation  #7 altered mental status - responding appropriately today  #8 acute hepatitis: improving - Viral hepatitis panel negative - Some of the AST likely due to rhabdomyolysis - Likely shock liver as well as  possible ETOH hepatitis  All the records are reviewed and case discussed with Care Management/Social Workerr. Management plans discussed with the patient, family and they are in agreement.  CODE STATUS: Full   TOTAL CRITICAL CARE TIME TAKING CARE OF THIS PATIENT: 35 minutes.  Greater than 50% of time spent in care coordination and counseling. POSSIBLE D/C IN ? DAYS, DEPENDING ON CLINICAL CONDITION.   Elby Showers M.D on 08/26/2015 at 3:31 PM  Between 7am to 6pm - Pager - [phone]  After 6pm go to [email or web address] - password [email or web address] [facility]  [facility]  Office  [phone]  CC: Primary care physician; No primary care provider on file.

## 2015-08-26 NOTE — Progress Notes (Signed)
Nutrition Follow-up     INTERVENTION:   Meals and Snacks: await diet progression post extubation   NUTRITION DIAGNOSIS:   Inadequate oral intake related to acute illness as evidenced by NPO status.  GOAL:   Provide needs based on ASPEN/SCCM guidelines  MONITOR:    (Energy intake, Digestive system, Electrolyte and renal profile)  REASON FOR ASSESSMENT:   Ventilator    ASSESSMENT:    Pt self-extubated during the night and was re-intubated, pt again self-extubated this AM; currently on [place]  Diet Order:  Diet NPO time specified   EN: TF discontinued post extubation  Digestive System: Cdiff positive  Electrolyte and Renal Profile:  Recent Labs Lab 08/21/15 1247  08/24/15 0943 08/25/15 0503 08/26/15 0515  BUN 38*  < > 45* 38* 36*  CREATININE 2.91*  < > 4.68* 4.18* 3.89*  NA 145  < > 143 143 142  K 3.7  < > 3.7 3.4* 3.1*  MG 2.7*  --   --   --   --   PHOS  --   < > 3.3 3.7 2.9  < > = values in this interval not displayed. Glucose Profile:   Recent Labs  08/25/15 2334 08/26/15 0725 08/26/15 1119  GLUCAP 139* 115* 97   Meds: ss novolog, NS at 150 ml/hr, solumedrol, potassium chloride  Height:   Ht Readings from Last 1 Encounters:  08/21/15 5\' 11"  (1.803 m)    Weight:   Wt Readings from Last 1 Encounters:  08/25/15 244 lb 4.3 oz (110.8 kg)    Filed Weights   08/23/15 1345 08/25/15 0634 08/25/15 0945  Weight: 234 lb 2.1 oz (106.2 kg) 239 lb 3.2 oz (108.5 kg) 244 lb 4.3 oz (110.8 kg)    BMI:  Body mass index is 34.08 kg/(m^2).  Estimated Nutritional Needs:   Kcal:  1950-2340 kcals using IBW 78 kg  Protein:  94-109 g (1.2-1.4 g/kg)   Fluid:  (25-3530ml/kg) 1950-236840ml/d  EDUCATION NEEDS:   No education needs identified at this time  HIGH Care Level  Romelle Starcherate Shatarra Wehling MS, RD, LDN [phone] Pager

## 2015-08-26 NOTE — Consult Note (Signed)
[facility] Consultation      Name: MICHOEL KUNIN MRN: [id number] DOB: 1976/01/20    ADMISSION DATE:  08/21/2015   CHIEF COMPLAINT:    acute resp failure   SUBJECTIVE  Patient had a bronchoscopy yesterday, noted with thick secretions in the right lung. He had overnight included self extubation and acutely reintubated. This morning Precedex drip is at 0.5, patient awake easily but become somnolent, able to follow commands take deep breaths and attempt cough. Plan for more of a wakeup assessment and possibly extubate in the afternoon    SIGNIFICANT EVENTS   11/4 intubated 11/5 failed vent wean due to encephalopathy and acidosis 11/6 RIJ CVL, R Fem Vein VasCath 11/8 bronchoscopy with BAL   Review of Systems  Unable to perform ROS: intubated      VITAL SIGNS    Temp:  [96.1 F (35.6 C)-101.1 F (38.4 C)] 101.1 F (38.4 C) (11/09 0800) Pulse Rate:  [71-97] 71 (11/09 0800) Resp:  [13-24] 15 (11/09 0800) BP: (70-129)/(32-104) 114/71 mmHg (11/09 0800) SpO2:  [96 %-100 %] 98 % (11/09 0800) FiO2 (%):  [35 %-50 %] 40 % (11/09 0923) HEMODYNAMICS:   VENTILATOR SETTINGS: Vent Mode:  [-] Spontaneous FiO2 (%):  [35 %-50 %] 40 % Set Rate:  [16 bmp] 16 bmp Vt Set:  [550 mL] 550 mL PEEP:  [5 cmH20] 5 cmH20 Pressure Support:  [8 cmH20-10 cmH20] 8 cmH20 Plateau Pressure:  [15 cmH20-18 cmH20] 18 cmH20 INTAKE / OUTPUT:  Intake/Output Summary (Last 24 hours) at 08/26/15 0950 Last data filed at 08/25/15 1845  Gross per 24 hour  Intake 1800.81 ml  Output   1678 ml  Net 122.81 ml       PHYSICAL EXAM   Physical Exam  Constitutional: No distress.  HENT:  Head: Normocephalic and atraumatic.  Eyes: Pupils are equal, round, and reactive to light. No scleral icterus.  Neck: Normal range of motion. Neck supple.  Cardiovascular: Normal rate and regular rhythm.   No murmur heard. Pulmonary/Chest: No respiratory distress. He has no wheezes.    Coarse BS, but improving  Abdominal: Soft. He exhibits no distension. There is no tenderness.  Musculoskeletal: He exhibits no edema.  Neurological: He is alert.  Gcs>10T Still with some somnolence  Skin: Skin is warm. He is not diaphoretic.       LABS   LABS:  CBC  Recent Labs Lab 08/24/15 0943 08/25/15 0459 08/26/15 0515  WBC 8.5 8.5 10.2  HGB 11.4* 11.4* 11.4*  HCT 34.3* 34.6* 34.8*  PLT 66* 84* 97*   Coag's  Recent Labs Lab 08/21/15 1247  APTT 28  INR 1.09   BMET  Recent Labs Lab 08/24/15 0943 08/25/15 0503 08/26/15 0515  NA 143 143 142  K 3.7 3.4* 3.1*  CL 110 106 107  CO2 BUN 45* 38* 36*  CREATININE 4.68* 4.18* 3.89*  GLUCOSE 95 95 132*   Electrolytes  Recent Labs Lab 08/21/15 1247  08/24/15 0943 08/25/15 0503 08/26/15 0515  CALCIUM 9.1  < > 6.6* 7.5* 8.1*  MG 2.7*  --   --   --   --   PHOS  --   < > 3.3 3.7 2.9  < > = values in this interval not displayed. Sepsis Markers  Recent Labs Lab 08/22/15 0022 08/22/15 0240 08/22/15 0550  LATICACIDVEN 2.1* 2.1* 2.0   ABG  Recent Labs Lab 08/21/15 2002 08/22/15 1010 08/23/15 0920  PHART 7.33* 7.34* 7.35  PCO2ART 41 44 41  PO2ART 142* 103 124*   Liver Enzymes  Recent Labs Lab 08/24/15 0943 08/25/15 0459 08/25/15 0503 08/26/15 0515  AST 1274* 1376*  --  940*  ALT 636* 548*  --  411*  ALKPHOS 73 121  --  375*  BILITOT 1.4* 1.1  --  0.6  ALBUMIN 2.4*  2.3* 2.4* 2.3* 2.1*  2.1*   Cardiac Enzymes  Recent Labs Lab 08/21/15 1247 08/21/15 2031 08/22/15 0240  TROPONINI 1.56* 1.73* 1.79*   Glucose  Recent Labs Lab 08/25/15 0733 08/25/15 1113 08/25/15 1622 08/25/15 1956 08/25/15 2334 08/26/15 0725  GLUCAP 90 112* 111* 126* 139* 115*     Recent Results (from the past 240 hour(s))  Blood Culture (routine x 2)     Status: None (Preliminary result)   Collection Time: 08/21/15 12:47 PM  Result Value Ref Range Status   Specimen Description BLOOD  UNKNOWN  Final   Special Requests BOTTLES DRAWN AEROBIC AND ANAEROBIC  1CC  Final   Culture NO GROWTH 4 DAYS  Final   Report Status PENDING  Incomplete  Blood Culture (routine x 2)     Status: None (Preliminary result)   Collection Time: 08/21/15 12:47 PM  Result Value Ref Range Status   Specimen Description BLOOD LEFT AC  Final   Special Requests BOTTLES DRAWN AEROBIC AND ANAEROBIC  6CC  Final   Culture NO GROWTH 4 DAYS  Final   Report Status PENDING  Incomplete  Urine culture     Status: None   Collection Time: 08/21/15 12:47 PM  Result Value Ref Range Status   Specimen Description URINE, CATHETERIZED  Final   Special Requests Normal  Final   Culture NO GROWTH 2 DAYS  Final   Report Status 08/23/2015 FINAL  Final  MRSA PCR Screening     Status: None   Collection Time: 08/21/15 11:47 PM  Result Value Ref Range Status   MRSA by PCR NEGATIVE NEGATIVE Final    Comment:        The GeneXpert MRSA Assay (FDA approved for NASAL specimens only), is one component of a comprehensive MRSA colonization surveillance program. It is not intended to diagnose MRSA infection nor to guide or monitor treatment for MRSA infections.   C difficile quick scan w PCR reflex     Status: Abnormal   Collection Time: 08/22/15  2:31 PM  Result Value Ref Range Status   C Diff antigen POSITIVE (A) NEGATIVE Final   C Diff toxin POSITIVE (A) NEGATIVE Final   C Diff interpretation   Final    Positive for toxigenic C. difficile, active toxin production present.    Comment: CRITICAL RESULT CALLED TO, READ BACK BY AND VERIFIED WITH: CHARLIE FLEETWOOD,RN 08/22/2015 1544 BY J RAZZAKSUAREZ,MT      Current facility-administered medications:  .  0.9 %  sodium chloride infusion, , Intravenous, Continuous, Erin Fulling, MD, Last Rate: 150 mL/hr at 08/26/15 0548 .  acetaminophen (TYLENOL) tablet 650 mg, 650 mg, Oral, Q6H PRN **OR** acetaminophen (TYLENOL) suppository 650 mg, 650 mg, Rectal, Q6H PRN, Alias Villagran,  MD .  antiseptic oral rinse solution (CORINZ), 7 mL, Mouth Rinse, QID, Gale Journey, MD, 7 mL at 08/26/15 0459 .  chlorhexidine gluconate (PERIDEX) 0.12 % solution 15 mL, 15 mL, Mouth Rinse, BID, Gale Journey, MD, 15 mL at 08/26/15 0900 .  dexmedetomidine (PRECEDEX) 400 MCG/100ML (4 mcg/mL) infusion, 0.4-1.2 mcg/kg/hr, Intravenous, Titrated, Pauletta Browns, MD, Last Rate: 32.6 mL/hr at 08/26/15 0547,  1.2 mcg/kg/hr at 08/26/15 0547 .  famotidine (PEPCID) tablet 20 mg, 20 mg, Oral, Q24H, Dugan Vanhoesen, MD, 20 mg at 08/25/15 1723 .  feeding supplement (VITAL HIGH PROTEIN) liquid 1,000 mL, 1,000 mL, Per Tube, Q24H, Medina Degraffenreid, MD, 1,000 mL at 08/25/15 1500 .  fentaNYL in NS (65mcg/ml) infusion-PREMIX, 100 mcg/hr, Intravenous, Continuous, Loleta Rose, MD, Last Rate: 40 mL/hr at 08/26/15 0457, 400 mcg/hr at 08/26/15 0457 .  free water 100 mL, 100 mL, Per Tube, 3 times per day, Stephanie Acre, MD, 100 mL at 08/25/15 2129 .  insulin aspart (novoLOG) injection 0-5 Units, 0-5 Units, Subcutaneous, QHS, Gale Journey, MD, 0 Units at 08/21/15 2318 .  insulin aspart (novoLOG) injection 0-9 Units, 0-9 Units, Subcutaneous, TID WC, Gale Journey, MD, 1 Units at 08/22/15 [phone] .  meropenem (MERREM) 1 g in sodium chloride 0.9 % 100 mL IVPB, 1 g, Intravenous, Q12H, Bertram Savin, RPH, 1 g at 08/25/15 1645 .  midazolam (VERSED) 50 mg in sodium chloride 0.9 % 50 mL (1 mg/mL) infusion, 1 mg/hr, Intravenous, Continuous, Loleta Rose, MD, Last Rate: 2 mL/hr at 08/25/15 2310, 2 mg/hr at 08/25/15 2310 .  vancomycin (VANCOCIN) 50 mg/mL oral solution 125 mg, 125 mg, Oral, 4 times per day, Stephanie Acre, MD, 125 mg at 08/25/15 2300 .  vancomycin (VANCOCIN) IVPB 1000 mg/200 mL premix, 1,000 mg, Intravenous, Q24H, Bertram Savin, RPH, 1,000 mg at 08/25/15 1646  IMAGING    Dg Abd 1 View  08/26/2015  CLINICAL DATA:  Nasogastric tube placement EXAM: ABDOMEN - 1 VIEW COMPARISON:  07/21/2015  FINDINGS: Motion degraded study showing nasogastric tube tip at the proximal stomach. Dialysis catheter by right femoral approach, tip at the L2-3 disc level. Moderate gaseous distension of bowel with nonobstructive pattern. IMPRESSION: Nasogastric tube tip at the proximal stomach. Electronically Signed   By: Marnee Spring M.D.   On: 08/26/2015 05:47   Dg Chest Port 1 View  08/26/2015  CLINICAL DATA:  Self extubated EXAM: PORTABLE CHEST 1 VIEW COMPARISON:  Yesterday FINDINGS: Replaced endotracheal tube tip in good position between clavicular heads and carina. Right IJ central line remains in good position, tip at the upper right atrium. An orogastric tube at least reaches the stomach. New hazy opacity at the bases with bandlike atelectasis along the right minor fissure. No overt edema. No effusion or pneumothorax. IMPRESSION: 1. Replaced endotracheal tube in good position. 2. New basilar lung opacities favor atelectasis or aspiration given the circumstances. Electronically Signed   By: Marnee Spring M.D.   On: 08/26/2015 05:46   Dg Chest Port 1 View  08/25/2015  CLINICAL DATA:  Respiratory failure, intubated. EXAM: PORTABLE CHEST 1 VIEW COMPARISON:  08/23/2015 FINDINGS: Endotracheal tube, NG tube and right central line remain in place, unchanged. Low lung volumes with increasing vascular congestion. No overt edema. Heart is upper limits normal in size. No visible effusions. IMPRESSION: Decreasing lung volumes with increasing vascular congestion. Electronically Signed   By: Charlett Nose M.D.   On: 08/25/2015 11:57      Indwelling Urinary Catheter continued, requirement due to   Reason to continue Indwelling Urinary Catheter for strict Intake/Output monitoring for hemodynamic instability         Ventilator continued, requirement due to, resp failure    Ventilator Sedation RASS 0 to -2   MAJOR EVENTS/TEST RESULTS: 11/4 intubated 11/5 failed vent wean 11/8 bronchoscopy   INDWELLING  DEVICES:: 11/6 RIJ CVL, R Fem Vein VasCath>>  MICRO  DATA: MRSA PCR >>neg Urine 11/4>>negative Blood x 2 11/4>>No growth to date Resp BAL 11/8>> FLU H1N1>> NEG c diff + (antigend and toxin) 11/5  ANTIMICROBIALS:  Oral vanc>>11/5 Flagy>>11/5    ASSESSMENT/PLAN   49 yo AAM with acute resp failure with acute encephalopathy from acute sepsis from acute recurrent c diff colitis With acute renal failure from rhabdomyolysis with acute drug abuse with etoh abuse  PULMONARY-failed vent wean due to encephalopathy and acidosis Respiratory Failure  -MV support - plan for wean and possible extubation today, once more awake.  -continue Bronchodilator Therapy -Wean Fio2 and PEEP as tolerated -Thick secretions and difficult weaning, now status post bronchoscopy with BAL, moderate thick secretions in right airway during bronch, now with thin, frothy secretions Follow-up BAL -Concern for right-sided pneumonia/pneumonitis-may have had aspiration when found down, will start on meropenem and IV Vanco (currently being treated for C. Difficile)  CARDIOVASCULAR -blood pressure stable -not on vasopressors -IVF's  RENAL-Rhabdomyolysis - ck's trending down Acute renal failure from ATN -currently on intermittent HD -trend and follow CK levels. (trending down) -appreciate nephro input  GASTROINTESTINAL GI prophylaxis  HEMATOLOGIC Decreased PLT count - 97 today - improving -stop heparin infusion   INFECTIOUS + cdiff -oral vancomycin and flagyl  ENDOCRINE - ICU hypoglycemic\Hyperglycemia protocol   NEUROLOGIC - intubated and sedated - minimal sedation to achieve a RASS goal: -1  TTP less likely at this time: continue current care and management  I have personally obtained a history, examined the patient, evaluated laboratory and independently reviewed  imaging results, formulated the assessment and plan and placed orders.  The Patient requires high complexity decision making for  assessment and support, frequent evaluation and titration of therapies, application of advanced monitoring technologies and extensive interpretation of multiple databases. Critical Care Time devoted to patient care services described in this note is 35 minutes.   Overall, patient is critically ill, prognosis is guarded. Patient at high risk for cardiac arrest and death.   Stephanie AcreVishal Elizeth Weinrich, MD [facility] Pager [phone]- [phone] (please enter 7-digits) On Call Pager - [phone] (please enter 7-digits)

## 2015-08-26 NOTE — Progress Notes (Signed)
MEDICATION RELATED CONSULT NOTE - INITIAL   Pharmacy Consult for Electrolytes Indication: K+, Mg +  Allergies  Allergen Reactions  . Antihistamines, Diphenhydramine-Type Anaphylaxis and Rash  . Benadryl [Diphenhydramine Hcl] Anaphylaxis and Rash  . Hydroxyzine Anaphylaxis and Rash    Patient Measurements: Height: 5\' 11"  (180.3 cm) Weight: 253 lb 4.9 oz (114.9 kg) IBW/kg (Calculated) : 75.3 Adjusted Body Weight:   Vital Signs: Temp: 100 F (37.8 C) (11/09 1800) BP: 134/83 mmHg (11/09 1800) Pulse Rate: 72 (11/09 1800) Intake/Output from previous day: 11/08 0701 - 11/09 0700 In: 2856.3 [I.V.:1296.3; NG/GT:1260; IV Piggyback:300] Out: 1758 [Urine:258] Intake/Output from this shift:    Labs:  Recent Labs  08/24/15 0943 08/25/15 0459 08/25/15 0503 08/26/15 0515 08/26/15 1819  WBC 8.5 8.5  --  10.2  --   HGB 11.4* 11.4*  --  11.4*  --   HCT 34.3* 34.6*  --  34.8*  --   PLT 66* 84*  --  97*  --   CREATININE 4.68*  --  4.18* 3.89*  --   MG  --   --   --   --  2.0  PHOS 3.3  --  3.7 2.9  --   ALBUMIN 2.4*  2.3* 2.4* 2.3* 2.1*  2.1*  --   PROT 5.4* 5.6*  --  5.6*  --   AST 1274* 1376*  --  940*  --   ALT 636* 548*  --  411*  --   ALKPHOS 73 121  --  375*  --   BILITOT 1.4* 1.1  --  0.6  --   BILIDIR 0.5 0.3  --  0.3  --   IBILI 0.9 0.8  --  0.3  --    Estimated Creatinine Clearance: 30.2 mL/min (by C-G formula based on Cr of 3.89).   Microbiology: Recent Results (from the past 720 hour(s))  Blood Culture (routine x 2)     Status: None (Preliminary result)   Collection Time: 08/21/15 12:47 PM  Result Value Ref Range Status   Specimen Description BLOOD UNKNOWN  Final   Special Requests BOTTLES DRAWN AEROBIC AND ANAEROBIC  1CC  Final   Culture NO GROWTH 4 DAYS  Final   Report Status PENDING  Incomplete  Blood Culture (routine x 2)     Status: None (Preliminary result)   Collection Time: 08/21/15 12:47 PM  Result Value Ref Range Status   Specimen Description  BLOOD LEFT AC  Final   Special Requests BOTTLES DRAWN AEROBIC AND ANAEROBIC  6CC  Final   Culture NO GROWTH 4 DAYS  Final   Report Status PENDING  Incomplete  Urine culture     Status: None   Collection Time: 08/21/15 12:47 PM  Result Value Ref Range Status   Specimen Description URINE, CATHETERIZED  Final   Special Requests Normal  Final   Culture NO GROWTH 2 DAYS  Final   Report Status 08/23/2015 FINAL  Final  MRSA PCR Screening     Status: None   Collection Time: 08/21/15 11:47 PM  Result Value Ref Range Status   MRSA by PCR NEGATIVE NEGATIVE Final    Comment:        The GeneXpert MRSA Assay (FDA approved for NASAL specimens only), is one component of a comprehensive MRSA colonization surveillance program. It is not intended to diagnose MRSA infection nor to guide or monitor treatment for MRSA infections.   C difficile quick scan w PCR reflex     Status:  Abnormal   Collection Time: 08/22/15  2:31 PM  Result Value Ref Range Status   C Diff antigen POSITIVE (A) NEGATIVE Final   C Diff toxin POSITIVE (A) NEGATIVE Final   C Diff interpretation   Final    Positive for toxigenic C. difficile, active toxin production present.    Comment: CRITICAL RESULT CALLED TO, READ BACK BY AND VERIFIED WITH: CHARLIE FLEETWOOD,RN 08/22/2015 1544 BY J RAZZAKSUAREZ,MT   Culture, bal-quantitative     Status: None (Preliminary result)   Collection Time: 08/25/15  3:15 PM  Result Value Ref Range Status   Specimen Description BRONCHIAL ALVEOLAR LAVAGE  Final   Special Requests NONE  Final   Gram Stain PENDING  Incomplete   Culture HOLDING FOR POSSIBLE PATHOGEN  Final   Report Status PENDING  Incomplete    Medical History: Past Medical History  Diagnosis Date  . Sleep apnea     had surgery to correct it  . Mental disorder     PTSD  . Seizures (HCC)     currently dx with seizures  . Cardiac arrest (HCC) 08/23/2011  . Pneumonia 09/04/2011  . Pancreatitis   . SOB (shortness of breath)  09/11/2011  . Hypertension   . Anxiety   . Myocardial infarction (HCC)   . Schizophrenia (HCC)   . Rhabdomyolysis 08/23/2015  . Acute renal failure (HCC) 08/23/2015    Medications:  Prescriptions prior to admission  Medication Sig Dispense Refill Last Dose  . acetaminophen (TYLENOL) 325 MG tablet Take 650 mg by mouth every 4 (four) hours as needed for mild pain or fever.    PRN at PRN  . fluticasone (FLONASE) 50 MCG/ACT nasal spray Place 1-2 sprays into both nostrils at bedtime.    unknown at unknown  . folic acid (FOLVITE) 1 MG tablet Take 1 mg by mouth daily.   unknown at unknown  . metFORMIN (GLUCOPHAGE) 500 MG tablet Take 500 mg by mouth daily.    unknown at unknown  . Multiple Vitamin (MULTIVITAMIN WITH MINERALS) TABS tablet Take 1 tablet by mouth daily.   unknown at unknown  . naltrexone (DEPADE) 50 MG tablet Take 50 mg by mouth daily.   unknown at unknown  . thiamine 100 MG tablet Take 100 mg by mouth daily.   unknown at unknown  . triamcinolone cream (KENALOG) 0.1 % Apply 1 application topically 2 (two) times daily as needed (for rash).   PRN at PRN  . Melatonin 3 MG TABS Take 3 mg by mouth at bedtime.    Completed Course at unknown  . metroNIDAZOLE (FLAGYL) 500 MG tablet Take 1 tablet (500 mg total) by mouth 3 (three) times daily. (Patient not taking: Reported on 08/21/2015) 42 tablet 0     Assessment: 11/09  :   K @ 18:00 = 3.6               Mg @ 18:00 = 2.0   Goal of Therapy:   Plan:  No further electrolyte supplementation needed.   Damien Cisar D 08/26/2015,8:51 PM

## 2015-08-26 NOTE — Progress Notes (Signed)
Subjective:   Hemodialysis for last three days for acute renal failure and rhabdomyolysis. UF of 1.5 litres yesterday. Tolerated treatment well.  Currently with oliguric urine output. NS at 12550mL/hr   CK 14055 (28484) UOP 258 (624) (845)  Liver function enzymes improving.   Tmax 101.1  Bronchoscopy yesterday.   Objective:  Vital signs in last 24 hours:  Temp:  [96.1 F (35.6 C)-101.1 F (38.4 C)] 101.1 F (38.4 C) (11/09 0800) Pulse Rate:  [71-97] 71 (11/09 0800) Resp:  [13-24] 15 (11/09 0800) BP: (70-129)/(32-104) 114/71 mmHg (11/09 0800) SpO2:  [96 %-100 %] 98 % (11/09 0800) FiO2 (%):  [35 %-50 %] 40 % (11/09 0923) Weight:  [110.8 kg (244 lb 4.3 oz)] 110.8 kg (244 lb 4.3 oz) (11/08 0945)  Weight change: 2.3 kg (5 lb 1.1 oz) Filed Weights   08/23/15 1345 08/25/15 0634 08/25/15 0945  Weight: 106.2 kg (234 lb 2.1 oz) 108.5 kg (239 lb 3.2 oz) 110.8 kg (244 lb 4.3 oz)    Intake/Output:    Intake/Output Summary (Last 24 hours) at 08/26/15 0926 Last data filed at 08/25/15 1845  Gross per 24 hour  Intake 1800.81 ml  Output   1678 ml  Net 122.81 ml     Physical Exam: General:  critically ill-appearing  HEENT  eyes open, responds to verbal stimuli  Neck  supple  Pulm/lungs  ventilator dependent, Pressure Support 40%  CVS/Heart  tachycardic sinus  Abdomen:   soft, +bowel sounds, nontender  Extremities:  no significant peripheral edema  Neurologic:  sedated  Skin:  no acute rashes  Access: Right femoral non tunneled catheter Dr. Belia HemanKasa 11/6  GU  Foley     Basic Metabolic Panel:   Recent Labs Lab 08/21/15 1247 08/22/15 0241 08/23/15 0139 08/23/15 1346 08/24/15 0943 08/25/15 0503 08/26/15 0515  NA 145 143  --  144 143 143 142  K 3.7 3.5  --  4.2 3.7 3.4* 3.1*  CL 104 113*  --  107 110 106 107  CO2 19* 24  --  24 24 28 28   GLUCOSE 90 137*  --  97 95 95 132*  BUN 38* 33*  --  45* 45* 38* 36*  CREATININE 2.91* 2.03* 3.13* 4.15* 4.68* 4.18* 3.89*  CALCIUM  9.1 7.2*  --  6.5* 6.6* 7.5* 8.1*  MG 2.7*  --   --   --   --   --   --   PHOS  --   --   --  4.7* 3.3 3.7 2.9     CBC:  Recent Labs Lab 08/21/15 1247 08/22/15 0241 08/23/15 0139 08/24/15 0943 08/25/15 0459 08/26/15 0515  WBC 5.8 5.2 5.7 8.5 8.5 10.2  NEUTROABS 4.7  --  4.9  --   --   --   HGB 15.9 14.3 13.6 11.4* 11.4* 11.4*  HCT 47.2 44.0 41.8 34.3* 34.6* 34.8*  MCV 90.9 90.4 91.7 90.1 90.8 89.7  PLT PLATELET CLUMPS NOTED ON SMEAR, COUNT APPEARS DECREASED 56* 51* 66* 84* 97*      Microbiology:  Recent Results (from the past 720 hour(s))  Blood Culture (routine x 2)     Status: None (Preliminary result)   Collection Time: 08/21/15 12:47 PM  Result Value Ref Range Status   Specimen Description BLOOD UNKNOWN  Final   Special Requests BOTTLES DRAWN AEROBIC AND ANAEROBIC  1CC  Final   Culture NO GROWTH 4 DAYS  Final   Report Status PENDING  Incomplete  Blood Culture (  routine x 2)     Status: None (Preliminary result)   Collection Time: 08/21/15 12:47 PM  Result Value Ref Range Status   Specimen Description BLOOD LEFT AC  Final   Special Requests BOTTLES DRAWN AEROBIC AND ANAEROBIC  6CC  Final   Culture NO GROWTH 4 DAYS  Final   Report Status PENDING  Incomplete  Urine culture     Status: None   Collection Time: 08/21/15 12:47 PM  Result Value Ref Range Status   Specimen Description URINE, CATHETERIZED  Final   Special Requests Normal  Final   Culture NO GROWTH 2 DAYS  Final   Report Status 08/23/2015 FINAL  Final  MRSA PCR Screening     Status: None   Collection Time: 08/21/15 11:47 PM  Result Value Ref Range Status   MRSA by PCR NEGATIVE NEGATIVE Final    Comment:        The GeneXpert MRSA Assay (FDA approved for NASAL specimens only), is one component of a comprehensive MRSA colonization surveillance program. It is not intended to diagnose MRSA infection nor to guide or monitor treatment for MRSA infections.   C difficile quick scan w PCR reflex      Status: Abnormal   Collection Time: 08/22/15  2:31 PM  Result Value Ref Range Status   C Diff antigen POSITIVE (A) NEGATIVE Final   C Diff toxin POSITIVE (A) NEGATIVE Final   C Diff interpretation   Final    Positive for toxigenic C. difficile, active toxin production present.    Comment: CRITICAL RESULT CALLED TO, READ BACK BY AND VERIFIED WITH: CHARLIE FLEETWOOD,RN 08/22/2015 1544 BY J RAZZAKSUAREZ,MT     Coagulation Studies: No results for input(s): LABPROT, INR in the last 72 hours.  Urinalysis: No results for input(s): COLORURINE, LABSPEC, PHURINE, GLUCOSEU, HGBUR, BILIRUBINUR, KETONESUR, PROTEINUR, UROBILINOGEN, NITRITE, LEUKOCYTESUR in the last 72 hours.  Invalid input(s): APPERANCEUR    Imaging: Dg Abd 1 View  08/26/2015  CLINICAL DATA:  Nasogastric tube placement EXAM: ABDOMEN - 1 VIEW COMPARISON:  07/21/2015 FINDINGS: Motion degraded study showing nasogastric tube tip at the proximal stomach. Dialysis catheter by right femoral approach, tip at the L2-3 disc level. Moderate gaseous distension of bowel with nonobstructive pattern. IMPRESSION: Nasogastric tube tip at the proximal stomach. Electronically Signed   By: Marnee Spring M.D.   On: 08/26/2015 05:47   Dg Chest Port 1 View  08/26/2015  CLINICAL DATA:  Self extubated EXAM: PORTABLE CHEST 1 VIEW COMPARISON:  Yesterday FINDINGS: Replaced endotracheal tube tip in good position between clavicular heads and carina. Right IJ central line remains in good position, tip at the upper right atrium. An orogastric tube at least reaches the stomach. New hazy opacity at the bases with bandlike atelectasis along the right minor fissure. No overt edema. No effusion or pneumothorax. IMPRESSION: 1. Replaced endotracheal tube in good position. 2. New basilar lung opacities favor atelectasis or aspiration given the circumstances. Electronically Signed   By: Marnee Spring M.D.   On: 08/26/2015 05:46   Dg Chest Port 1 View  08/25/2015  CLINICAL  DATA:  Respiratory failure, intubated. EXAM: PORTABLE CHEST 1 VIEW COMPARISON:  08/23/2015 FINDINGS: Endotracheal tube, NG tube and right central line remain in place, unchanged. Low lung volumes with increasing vascular congestion. No overt edema. Heart is upper limits normal in size. No visible effusions. IMPRESSION: Decreasing lung volumes with increasing vascular congestion. Electronically Signed   By: Charlett Nose M.D.   On: 08/25/2015 11:57  Medications:   . sodium chloride 150 mL/hr at 08/26/15 0548  . dexmedetomidine 1.2 mcg/kg/hr (08/26/15 0547)  . fentaNYL infusion INTRAVENOUS 400 mcg/hr (08/26/15 0457)  . midazolam (VERSED) infusion 2 mg/hr (08/25/15 2310)   . antiseptic oral rinse  7 mL Mouth Rinse QID  . chlorhexidine gluconate  15 mL Mouth Rinse BID  . famotidine  20 mg Oral Q24H  . feeding supplement (VITAL HIGH PROTEIN)  1,000 mL Per Tube Q24H  . free water  100 mL Per Tube 3 times per day  . insulin aspart  0-5 Units Subcutaneous QHS  . insulin aspart  0-9 Units Subcutaneous TID WC  . meropenem (MERREM) IV  1 g Intravenous Q12H  . vancomycin  125 mg Oral 4 times per day  . vancomycin  1,000 mg Intravenous Q24H   acetaminophen **OR** acetaminophen  Assessment/ Plan:  49 y.o. male With alcohol abuse, schizophrenia, seizure disorder, coronary disease/MI 2012 was [facility] to [facility] on November 4 after being found unresponsive in his apartment. Urine toxicology screen is positive for benzodiazepines and cannabinoids. Stool positive for C. Difficile  1. Acute renal failure, likely secondary to ATN from concurrent illness, volume depletion, C. difficile, rhabdomyolysis and sepsis.  Urine output is poor. Serum creatinine but how much is due to dialysis? CK level is improving.  Hemodialysis initial treatment for acute renal failure 11/6. Second treatment on 11/7. Third treatment on 11/8 Plan on holding dialysis for today. Will monitor closely Monitor volume status, urine  output and serum electrolytes.   2. Acute rhabdomyolysis M62.82: with elevated transaminases and hypocalcemia Continue to monitor CK levels and liver function daily IV NS at 150  3. Acute respiratory failure: Ventilator assisted at this time - appreciate pulmonary input. BAL 11/8.  - empiric antibiotics of meropenem and vancomycin IV  4. Hypertension: blood pressure at goal.   5. Infective Colitis A09: c. Diff positive.  - PO vancomycin    LOS: 5 Arti Trang 11/9/20169:26 AM

## 2015-08-26 NOTE — Progress Notes (Signed)
ANTIBIOTIC CONSULT NOTE - INITIAL  Pharmacy Consult for Vancomycin/Meropenem Dosing Indication: rule out pneumonia  Allergies  Allergen Reactions  . Antihistamines, Diphenhydramine-Type Anaphylaxis and Rash  . Benadryl [Diphenhydramine Hcl] Anaphylaxis and Rash  . Hydroxyzine Anaphylaxis and Rash    Patient Measurements: Height: 5\' 11"  (180.3 cm) Weight: 253 lb 4.9 oz (114.9 kg) IBW/kg (Calculated) : 75.3 Adjusted Body Weight: 89kg  Vital Signs: Temp: 101.1 F (38.4 C) (11/09 0800) BP: 114/71 mmHg (11/09 0800) Pulse Rate: 71 (11/09 0800) Intake/Output from previous day: 11/08 0701 - 11/09 0700 In: 2856.3 [I.V.:1296.3; NG/GT:1260; IV Piggyback:300] Out: 1758 [Urine:258] Intake/Output from this shift:    Labs:  Recent Labs  08/24/15 0943 08/25/15 0459 08/25/15 0503 08/26/15 0515  WBC 8.5 8.5  --  10.2  HGB 11.4* 11.4*  --  11.4*  PLT 66* 84*  --  97*  CREATININE 4.68*  --  4.18* 3.89*   Estimated Creatinine Clearance: 30.2 mL/min (by C-G formula based on Cr of 3.89). No results for input(s): VANCOTROUGH, VANCOPEAK, VANCORANDOM, GENTTROUGH, GENTPEAK, GENTRANDOM, TOBRATROUGH, TOBRAPEAK, TOBRARND, AMIKACINPEAK, AMIKACINTROU, AMIKACIN in the last 72 hours.   Microbiology: Recent Results (from the past 720 hour(s))  Blood Culture (routine x 2)     Status: None (Preliminary result)   Collection Time: 08/21/15 12:47 PM  Result Value Ref Range Status   Specimen Description BLOOD UNKNOWN  Final   Special Requests BOTTLES DRAWN AEROBIC AND ANAEROBIC  1CC  Final   Culture NO GROWTH 4 DAYS  Final   Report Status PENDING  Incomplete  Blood Culture (routine x 2)     Status: None (Preliminary result)   Collection Time: 08/21/15 12:47 PM  Result Value Ref Range Status   Specimen Description BLOOD LEFT AC  Final   Special Requests BOTTLES DRAWN AEROBIC AND ANAEROBIC  6CC  Final   Culture NO GROWTH 4 DAYS  Final   Report Status PENDING  Incomplete  Urine culture     Status:  None   Collection Time: 08/21/15 12:47 PM  Result Value Ref Range Status   Specimen Description URINE, CATHETERIZED  Final   Special Requests Normal  Final   Culture NO GROWTH 2 DAYS  Final   Report Status 08/23/2015 FINAL  Final  MRSA PCR Screening     Status: None   Collection Time: 08/21/15 11:47 PM  Result Value Ref Range Status   MRSA by PCR NEGATIVE NEGATIVE Final    Comment:        The GeneXpert MRSA Assay (FDA approved for NASAL specimens only), is one component of a comprehensive MRSA colonization surveillance program. It is not intended to diagnose MRSA infection nor to guide or monitor treatment for MRSA infections.   C difficile quick scan w PCR reflex     Status: Abnormal   Collection Time: 08/22/15  2:31 PM  Result Value Ref Range Status   C Diff antigen POSITIVE (A) NEGATIVE Final   C Diff toxin POSITIVE (A) NEGATIVE Final   C Diff interpretation   Final    Positive for toxigenic C. difficile, active toxin production present.    Comment: CRITICAL RESULT CALLED TO, READ BACK BY AND VERIFIED WITH: CHARLIE FLEETWOOD,RN 08/22/2015 1544 BY J RAZZAKSUAREZ,MT   Culture, bal-quantitative     Status: None (Preliminary result)   Collection Time: 08/25/15  3:15 PM  Result Value Ref Range Status   Specimen Description BRONCHIAL ALVEOLAR LAVAGE  Final   Special Requests NONE  Final   Gram Stain PENDING  Incomplete   Culture HOLDING FOR POSSIBLE PATHOGEN  Final   Report Status PENDING  Incomplete    Medical History: Past Medical History  Diagnosis Date  . Sleep apnea     had surgery to correct it  . Mental disorder     PTSD  . Seizures (HCC)     currently dx with seizures  . Cardiac arrest (HCC) 08/23/2011  . Pneumonia 09/04/2011  . Pancreatitis   . SOB (shortness of breath) 09/11/2011  . Hypertension   . Anxiety   . Myocardial infarction (HCC)   . Schizophrenia (HCC)   . Rhabdomyolysis 08/23/2015  . Acute renal failure (HCC) 08/23/2015    Medications:   Scheduled:  . antiseptic oral rinse  7 mL Mouth Rinse QID  . chlorhexidine gluconate  15 mL Mouth Rinse BID  . famotidine  20 mg Oral Q24H  . insulin aspart  0-5 Units Subcutaneous QHS  . insulin aspart  0-9 Units Subcutaneous TID WC  . meropenem (MERREM) IV  1 g Intravenous Q12H  . methylPREDNISolone (SOLU-MEDROL) injection  40 mg Intravenous Q6H  . vancomycin  125 mg Oral 4 times per day  . vancomycin (VANCOCIN) rectal ENEMA  500 mg Rectal 4 times per day  . vancomycin  1,000 mg Intravenous Q24H   Infusions:  . sodium chloride 150 mL/hr at 08/26/15 1411  . dexmedetomidine 0.25 mcg/kg/hr (08/26/15 1032)  . fentaNYL infusion INTRAVENOUS 400 mcg/hr (08/26/15 0457)  . midazolam (VERSED) infusion 2 mg/hr (08/25/15 2310)   Assessment: Pharmacy consulted to dose vancomycin and meropenem for concern of right-sided pneumonia/pneumonitis-may have had aspiration when found down, will start on meropenem and IV Vanco (currently being treated for C. Difficile).    Goal of Therapy:  Vancomycin trough level 15-20 mcg/ml  Plan:  Patient with acute renal failure receiving dialysis 11/6-11/8 with no dialysis today. Patient received 1000 mg of vancomycin yesterday. Will have to dose patient as ESRD/HD as SCr will not be useful to estimate renal function or guide dosing in patient receiving HD. Will give another dose of vancomycin 1000 mg to complete a 2000 mg loading dose and check a random vancomycin level with AM labs. Will decrease Meropenem to 1000 mg iv q 24 hours. Will continue to follow renal function, culture results, and plans for HD.   Luisa Hart D 08/26/2015,3:43 PM

## 2015-08-26 NOTE — Progress Notes (Addendum)
Patient self extubated  Intubate without difficulty 100 succ, 20 etomidate, 7.5ET at 25 co2 color change, bilateral breath sounds - cxr pending

## 2015-08-26 NOTE — Progress Notes (Signed)
Pt self extubated. Pt placed on 2 liters oxygen via nasal cannula. Roland Prine E 4:58 PM 08/26/2015

## 2015-08-26 NOTE — Progress Notes (Signed)
Abd Xray complete and read.  Informed Elink MD requesting permission to use same.  Awaiting response.

## 2015-08-26 NOTE — Procedures (Signed)
Patient had self extubated during bath. Dr. Clint GuyHower at bedside for reintubation. Patient reintubated with a 7.5 ET tube and placed at 26 cm at the lip by B. Stophel RRT,RCP at 04:40. Chest XRay pending. Patient placed back on ventilator on previous settings.

## 2015-08-26 NOTE — Progress Notes (Signed)
Update: Patient self extubated again about 10:30 this morning, noted to be mildly somnolent but placed on nasal cannula and suction. Given one dose of racemic epinephrine, followed by one dose of IV Solu-Medrol 40 mg. Reassessment showed that patient with mild cough but stable respiratory status, has some coarse upper airway sounds, in no acute distress.  No acute need to reintubate at this time.  Will continue to assess respiratory status throughout the day.  Shaquan Puerta, MD [facility] Pager [phone]- [phone] (please enter 7-digits) On Call Pager - [phone] (please enter 7-digits)

## 2015-08-26 NOTE — Progress Notes (Addendum)
eLink Physician-Brief Progress Note Patient Name: Ross SpillersJames L [name] DOB: January 04, 1966 MRN: [id number]   Date of Service  08/26/2015  HPI/Events of Note  Self extubated  eICU Interventions  RT bag mask till hospitalist arrived per their protocol and will supervise RT intubation  Advised if turns difficult they shuold call anesthesia  Will sign off     Intervention Category Major Interventions: Airway management  [name] 08/26/2015, 4:26 AM

## 2015-08-27 LAB — CBC
HCT: 37.5 % — ABNORMAL LOW (ref 40.0–52.0)
Hemoglobin: 12.2 g/dL — ABNORMAL LOW (ref 13.0–18.0)
MCH: 28.9 pg (ref 26.0–34.0)
MCHC: 32.6 g/dL (ref 32.0–36.0)
MCV: 88.7 fL (ref 80.0–100.0)
PLATELETS: 120 10*3/uL — AB (ref 150–440)
RBC: 4.23 MIL/uL — AB (ref 4.40–5.90)
RDW: 14.6 % — ABNORMAL HIGH (ref 11.5–14.5)
WBC: 15.8 10*3/uL — AB (ref 3.8–10.6)

## 2015-08-27 LAB — HEPATIC FUNCTION PANEL
ALBUMIN: 2.1 g/dL — AB (ref 3.5–5.0)
ALT: 320 U/L — ABNORMAL HIGH (ref 17–63)
AST: 693 U/L — ABNORMAL HIGH (ref 15–41)
Alkaline Phosphatase: 375 U/L — ABNORMAL HIGH (ref 38–126)
BILIRUBIN DIRECT: 0.3 mg/dL (ref 0.1–0.5)
BILIRUBIN TOTAL: 1.1 mg/dL (ref 0.3–1.2)
Indirect Bilirubin: 0.8 mg/dL (ref 0.3–0.9)
Total Protein: 5.4 g/dL — ABNORMAL LOW (ref 6.5–8.1)

## 2015-08-27 LAB — RENAL FUNCTION PANEL
ALBUMIN: 2.1 g/dL — AB (ref 3.5–5.0)
Anion gap: 10 (ref 5–15)
BUN: 52 mg/dL — ABNORMAL HIGH (ref 6–20)
CALCIUM: 8.3 mg/dL — AB (ref 8.9–10.3)
CO2: 23 mmol/L (ref 22–32)
CREATININE: 4.52 mg/dL — AB (ref 0.61–1.24)
Chloride: 112 mmol/L — ABNORMAL HIGH (ref 101–111)
GFR calc non Af Amer: 14 mL/min — ABNORMAL LOW (ref >60)
GFR, EST AFRICAN AMERICAN: 16 mL/min — AB (ref >60)
Glucose, Bld: 133 mg/dL — ABNORMAL HIGH (ref 65–99)
PHOSPHORUS: 2.8 mg/dL (ref 2.5–4.6)
Potassium: 3.4 mmol/L — ABNORMAL LOW (ref 3.5–5.1)
SODIUM: 145 mmol/L (ref 135–145)

## 2015-08-27 LAB — GLUCOSE, CAPILLARY
GLUCOSE-CAPILLARY: 125 mg/dL — AB (ref 65–99)
GLUCOSE-CAPILLARY: 128 mg/dL — AB (ref 65–99)
Glucose-Capillary: 140 mg/dL — ABNORMAL HIGH (ref 65–99)
Glucose-Capillary: 166 mg/dL — ABNORMAL HIGH (ref 65–99)

## 2015-08-27 LAB — CULTURE, BAL-QUANTITATIVE

## 2015-08-27 LAB — VANCOMYCIN, RANDOM: VANCOMYCIN RM: 22 ug/mL

## 2015-08-27 LAB — CK: CK TOTAL: 10923 U/L — AB (ref 49–397)

## 2015-08-27 MED ORDER — POTASSIUM CHLORIDE 10 MEQ/50ML IV SOLN
10.0000 meq | INTRAVENOUS | Status: AC
  Administered 2015-08-27 (×4): 10 meq via INTRAVENOUS
  Filled 2015-08-27 (×5): qty 50

## 2015-08-27 MED ORDER — ENSURE ENLIVE PO LIQD
237.0000 mL | Freq: Two times a day (BID) | ORAL | Status: DC
  Administered 2015-08-27 – 2015-09-01 (×7): 237 mL via ORAL

## 2015-08-27 NOTE — Plan of Care (Signed)
Problem: SLP Dysphagia Goals Goal: Misc Dysphagia Goal Pt will safely tolerate po diet of least restrictive consistency w/ no overt s/s of aspiration noted by Staff/pt/family x3 sessions.    

## 2015-08-27 NOTE — Progress Notes (Signed)
Nutrition Follow-up   INTERVENTION:   Meals and Snacks: Cater to patient preferences Medical Food Supplement Therapy: recommend addition of Ensure Enlive po BID, each supplement provides 350 kcal and 20 grams of protein   NUTRITION DIAGNOSIS:   Inadequate oral intake related to acute illness as evidenced by NPO status. Improving as diet advance post extubation, tolerating diet, adding supplement  GOAL:   Provide needs based on ASPEN/SCCM guidelines   MONITOR:    (Energy intake, Digestive system, Electrolyte and renal profile)  REASON FOR ASSESSMENT:   Ventilator    ASSESSMENT:    Pt s/p extubation, diet advanced today, HD on hold at present, pt more alert but still some confusion  Diet Order:  DIET DYS 3 Room service appropriate?: Yes; Fluid consistency:: Thin   Energy Intake: recorded po intake 90% at lunch, 25% at breakfast this AM  Digestive System: flexiseal inserted, Cdiff positive  Electrolyte and Renal Profile:  Recent Labs Lab 08/21/15 1247  08/25/15 0503 08/26/15 0515 08/26/15 1819 08/27/15 0452  BUN 38*  < > 38* 36*  --  52*  CREATININE 2.91*  < > 4.18* 3.89*  --  4.52*  NA 145  < > 143 142  --  145  K 3.7  < > 3.4* 3.1* 3.6 3.4*  MG 2.7*  --   --   --  2.0  --   PHOS  --   < > 3.7 2.9  --  2.8  < > = values in this interval not displayed. \ Glucose Profile:  Recent Labs  08/27/15 0351 08/27/15 0751 08/27/15 1144  GLUCAP 125* 128* 140*   Meds: NS at 150 ml/hr, ss novolog, solumedrol  Height:   Ht Readings from Last 1 Encounters:  08/21/15 5\' 11"  (1.803 m)    Weight:   Wt Readings from Last 1 Encounters:  08/26/15 253 lb 4.9 oz (114.9 kg)    Filed Weights   08/25/15 0634 08/25/15 0945 08/26/15 1400  Weight: 239 lb 3.2 oz (108.5 kg) 244 lb 4.3 oz (110.8 kg) 253 lb 4.9 oz (114.9 kg)    BMI:  Body mass index is 35.35 kg/(m^2).  Estimated Nutritional Needs:   Kcal:  1950-2340 kcals using IBW 78 kg  Protein:  94-109 g (1.2-1.4  g/kg)   Fluid:  (25-6830ml/kg) 1950-237840ml/d  EDUCATION NEEDS:   No education needs identified at this time  MODERATE Care Level  Romelle Starcherate Luverta Korte MS, RD, LDN [phone] Pager

## 2015-08-27 NOTE — Progress Notes (Signed)
Subjective:   Held hemodialysis yesterday. UOP 600 Self extubated yesterday. Now speaking. Pending swallow eval.   NS at 13450mL/hr   CK 10923 (14055) (16109([id number])   Liver function enzymes improving.   Afebrile last 12 hours.   Objective:  Vital signs in last 24 hours:  Temp:  [96.1 F (35.6 C)-101.3 F (38.5 C)] 99.3 F (37.4 C) (11/10 0726) Pulse Rate:  [39-98] 53 (11/10 0726) Resp:  [15-37] 31 (11/10 0726) BP: (104-152)/(62-88) 146/79 mmHg (11/10 0700) SpO2:  [89 %-100 %] 97 % (11/10 0726) FiO2 (%):  [40 %] 40 % (11/09 0923) Weight:  [114.9 kg (253 lb 4.9 oz)] 114.9 kg (253 lb 4.9 oz) (11/09 1400)  Weight change: 4.1 kg (9 lb 0.6 oz) Filed Weights   08/25/15 0634 08/25/15 0945 08/26/15 1400  Weight: 108.5 kg (239 lb 3.2 oz) 110.8 kg (244 lb 4.3 oz) 114.9 kg (253 lb 4.9 oz)    Intake/Output:    Intake/Output Summary (Last 24 hours) at 08/27/15 0908 Last data filed at 08/27/15 0726  Gross per 24 hour  Intake      0 ml  Output    850 ml  Net   -850 ml     Physical Exam: General:  laying in bed, lethargic  HEENT  PERRL, dry mucosal membranes  Neck  supple  Pulm/lungs  clear  CVS/Heart  regular  Abdomen:   soft, +bowel sounds, nontender  Extremities:  + significant peripheral edema  Neurologic:  answering commands, not oriented  Skin:  no acute rashes  Access: Right femoral non tunneled catheter Dr. Belia HemanKasa 11/6       Basic Metabolic Panel:   Recent Labs Lab 08/21/15 1247  08/23/15 1346 08/24/15 0943 08/25/15 0503 08/26/15 0515 08/26/15 1819 08/27/15 0452  NA 145  < > 144 143 143 142  --  145  K 3.7  < > 4.2 3.7 3.4* 3.1* 3.6 3.4*  CL 104  < > 107 110 106 107  --  112*  CO2 19*  < > 24 24 28 28   --  23  GLUCOSE 90  < > 97 95 95 132*  --  133*  BUN 38*  < > 45* 45* 38* 36*  --  52*  CREATININE 2.91*  < > 4.15* 4.68* 4.18* 3.89*  --  4.52*  CALCIUM 9.1  < > 6.5* 6.6* 7.5* 8.1*  --  8.3*  MG 2.7*  --   --   --   --   --  2.0  --   PHOS  --   --  4.7*  3.3 3.7 2.9  --  2.8  < > = values in this interval not displayed.   CBC:  Recent Labs Lab 08/21/15 1247  08/23/15 0139 08/24/15 0943 08/25/15 0459 08/26/15 0515 08/27/15 0452  WBC 5.8  < > 5.7 8.5 8.5 10.2 15.8*  NEUTROABS 4.7  --  4.9  --   --   --   --   HGB 15.9  < > 13.6 11.4* 11.4* 11.4* 12.2*  HCT 47.2  < > 41.8 34.3* 34.6* 34.8* 37.5*  MCV 90.9  < > 91.7 90.1 90.8 89.7 88.7  PLT PLATELET CLUMPS NOTED ON SMEAR, COUNT APPEARS DECREASED  < > 51* 66* 84* 97* 120*  < > = values in this interval not displayed.    Microbiology:  Recent Results (from the past 720 hour(s))  Blood Culture (routine x 2)     Status: None (Preliminary result)  Collection Time: 08/21/15 12:47 PM  Result Value Ref Range Status   Specimen Description BLOOD UNKNOWN  Final   Special Requests BOTTLES DRAWN AEROBIC AND ANAEROBIC  1CC  Final   Culture NO GROWTH 4 DAYS  Final   Report Status PENDING  Incomplete  Blood Culture (routine x 2)     Status: None (Preliminary result)   Collection Time: 08/21/15 12:47 PM  Result Value Ref Range Status   Specimen Description BLOOD LEFT AC  Final   Special Requests BOTTLES DRAWN AEROBIC AND ANAEROBIC  6CC  Final   Culture NO GROWTH 4 DAYS  Final   Report Status PENDING  Incomplete  Urine culture     Status: None   Collection Time: 08/21/15 12:47 PM  Result Value Ref Range Status   Specimen Description URINE, CATHETERIZED  Final   Special Requests Normal  Final   Culture NO GROWTH 2 DAYS  Final   Report Status 08/23/2015 FINAL  Final  MRSA PCR Screening     Status: None   Collection Time: 08/21/15 11:47 PM  Result Value Ref Range Status   MRSA by PCR NEGATIVE NEGATIVE Final    Comment:        The GeneXpert MRSA Assay (FDA approved for NASAL specimens only), is one component of a comprehensive MRSA colonization surveillance program. It is not intended to diagnose MRSA infection nor to guide or monitor treatment for MRSA infections.   C difficile  quick scan w PCR reflex     Status: Abnormal   Collection Time: 08/22/15  2:31 PM  Result Value Ref Range Status   C Diff antigen POSITIVE (A) NEGATIVE Final   C Diff toxin POSITIVE (A) NEGATIVE Final   C Diff interpretation   Final    Positive for toxigenic C. difficile, active toxin production present.    Comment: CRITICAL RESULT CALLED TO, READ BACK BY AND VERIFIED WITH: CHARLIE FLEETWOOD,RN 08/22/2015 1544 BY J RAZZAKSUAREZ,MT   Culture, bal-quantitative     Status: None (Preliminary result)   Collection Time: 08/25/15  3:15 PM  Result Value Ref Range Status   Specimen Description BRONCHIAL ALVEOLAR LAVAGE  Final   Special Requests NONE  Final   Gram Stain PENDING  Incomplete   Culture HOLDING FOR POSSIBLE PATHOGEN  Final   Report Status PENDING  Incomplete    Coagulation Studies: No results for input(s): LABPROT, INR in the last 72 hours.  Urinalysis: No results for input(s): COLORURINE, LABSPEC, PHURINE, GLUCOSEU, HGBUR, BILIRUBINUR, KETONESUR, PROTEINUR, UROBILINOGEN, NITRITE, LEUKOCYTESUR in the last 72 hours.  Invalid input(s): APPERANCEUR    Imaging: Dg Abd 1 View  08/26/2015  CLINICAL DATA:  Nasogastric tube placement EXAM: ABDOMEN - 1 VIEW COMPARISON:  07/21/2015 FINDINGS: Motion degraded study showing nasogastric tube tip at the proximal stomach. Dialysis catheter by right femoral approach, tip at the L2-3 disc level. Moderate gaseous distension of bowel with nonobstructive pattern. IMPRESSION: Nasogastric tube tip at the proximal stomach. Electronically Signed   By: Marnee Spring M.D.   On: 08/26/2015 05:47   Dg Chest Port 1 View  08/26/2015  CLINICAL DATA:  Self extubated EXAM: PORTABLE CHEST 1 VIEW COMPARISON:  Yesterday FINDINGS: Replaced endotracheal tube tip in good position between clavicular heads and carina. Right IJ central line remains in good position, tip at the upper right atrium. An orogastric tube at least reaches the stomach. New hazy opacity at the  bases with bandlike atelectasis along the right minor fissure. No overt edema. No effusion or  pneumothorax. IMPRESSION: 1. Replaced endotracheal tube in good position. 2. New basilar lung opacities favor atelectasis or aspiration given the circumstances. Electronically Signed   By: Marnee Spring M.D.   On: 08/26/2015 05:46   Dg Chest Port 1 View  08/25/2015  CLINICAL DATA:  Respiratory failure, intubated. EXAM: PORTABLE CHEST 1 VIEW COMPARISON:  08/23/2015 FINDINGS: Endotracheal tube, NG tube and right central line remain in place, unchanged. Low lung volumes with increasing vascular congestion. No overt edema. Heart is upper limits normal in size. No visible effusions. IMPRESSION: Decreasing lung volumes with increasing vascular congestion. Electronically Signed   By: Charlett Nose M.D.   On: 08/25/2015 11:57     Medications:   . sodium chloride 150 mL/hr at 08/26/15 2353  . dexmedetomidine 0.7 mcg/kg/hr (08/27/15 1610)  . fentaNYL infusion INTRAVENOUS 400 mcg/hr (08/26/15 0457)  . midazolam (VERSED) infusion 2 mg/hr (08/25/15 2310)   . antiseptic oral rinse  7 mL Mouth Rinse QID  . chlorhexidine gluconate  15 mL Mouth Rinse BID  . famotidine  20 mg Oral Q24H  . insulin aspart  0-5 Units Subcutaneous QHS  . insulin aspart  0-9 Units Subcutaneous TID WC  . meropenem (MERREM) IV  1 g Intravenous Q24H  . methylPREDNISolone (SOLU-MEDROL) injection  40 mg Intravenous Q6H  . vancomycin  125 mg Oral 4 times per day  . vancomycin (VANCOCIN) rectal ENEMA  500 mg Rectal 4 times per day  . [START ON 08/29/2015] vancomycin  1,000 mg Intravenous Q72H   acetaminophen **OR** acetaminophen, ipratropium-albuterol  Assessment/ Plan:  49 y.o. male With alcohol abuse, schizophrenia, seizure disorder, coronary disease/MI 2012 was admitted to [facility] on November 4 after being found unresponsive in his apartment. Urine toxicology screen is positive for benzodiazepines and cannabinoids. Stool positive for C.  Difficile  1. Acute renal failure, likely secondary to ATN from concurrent illness, volume depletion, C. difficile, rhabdomyolysis and sepsis.  Urine output is nonoliguric. Serum creatinine and BUN rising. Will hold dialysis and monitor for dialysis need.  CK level is improving.  Hemodialysis initial treatment for acute renal failure 11/6, 11/7 and 11/8  Plan on holding dialysis for today. Will monitor closely Monitor volume status, urine output and serum electrolytes.   2. Acute rhabdomyolysis M62.82: with elevated transaminases and hypocalcemia Continue to monitor CK levels and liver function daily IV NS at 150  3. Hypertension: blood pressure at goal.   4. Sepsis: empiric treatment with meropenem and IV vancomycin  5. Infective Colitis A09: c. Diff positive.  - PO vancomycin - will need to pass swallow eval.     LOS: 6 Taiya Nutting 11/10/20169:08 AM

## 2015-08-27 NOTE — Progress Notes (Signed)
   08/27/15 1600  Clinical Encounter Type  Visited With Patient;Health care provider  Visit Type Initial  Consult/Referral To Chaplain  Spiritual Encounters  Spiritual Needs Emotional  Stress Factors  Patient Stress Factors None identified  Chaplain rounded in unit and offered a compassionate presence and support to patient. Chaplain Deyanira Fesler A. Patrena Santalucia Ext. [phone]

## 2015-08-27 NOTE — Consult Note (Signed)
[facility] [place] [facility] Consultation      Name: Ross [name] MRN: [id number] DOB: July 06, 1966    ADMISSION DATE:  08/21/2015   CHIEF COMPLAINT:    acute resp failure   SUBJECTIVE   Patient more awake, alert today. Able to vocalize needs, still with some mild confusion but significantly improved mentation   SIGNIFICANT EVENTS   11/4 intubated 11/5 failed vent wean due to encephalopathy and acidosis 11/6 RIJ CVL, R Fem Vein VasCath 11/8 bronchoscopy with BAL 11/9 self extubated  Review of Systems  Constitutional: Negative for fever and chills.  Respiratory: Positive for cough, sputum production and shortness of breath.   Cardiovascular: Negative for palpitations.  Gastrointestinal: Negative for heartburn, nausea, vomiting and constipation.  Skin: Negative for itching and rash.      VITAL SIGNS    Temp:  [96.1 F (35.6 C)-101.3 F (38.5 C)] 99.3 F (37.4 C) (11/10 0726) Pulse Rate:  [39-98] 53 (11/10 0726) Resp:  [15-37] 31 (11/10 0726) BP: (104-152)/(62-88) 146/79 mmHg (11/10 0700) SpO2:  [89 %-100 %] 97 % (11/10 0726) FiO2 (%):  [40 %] 40 % (11/09 0923) Weight:  [253 lb 4.9 oz (114.9 kg)] 253 lb 4.9 oz (114.9 kg) (11/09 1400) HEMODYNAMICS:   VENTILATOR SETTINGS: Vent Mode:  [-] Spontaneous FiO2 (%):  [40 %] 40 % PEEP:  [5 cmH20] 5 cmH20 Pressure Support:  [8 cmH20] 8 cmH20 INTAKE / OUTPUT:  Intake/Output Summary (Last 24 hours) at 08/27/15 0846 Last data filed at 08/27/15 0726  Gross per 24 hour  Intake      0 ml  Output    850 ml  Net   -850 ml       PHYSICAL EXAM   Physical Exam  Constitutional: No distress.  HENT:  Head: Normocephalic and atraumatic.  Eyes: Pupils are equal, round, and reactive to light.  Neck: Normal range of motion. Neck supple.  Cardiovascular: Normal rate and regular rhythm.   No murmur heard. Pulmonary/Chest: No respiratory distress. He has no wheezes.  Extubated, still with coarse BS, but  improving  Abdominal: Soft. He exhibits no distension. There is no tenderness.  Musculoskeletal: He exhibits no edema.  Neurological: He is alert.  More awake and alert today  Skin: Skin is warm and dry. He is not diaphoretic.       LABS   LABS:  CBC  Recent Labs Lab 08/25/15 0459 08/26/15 0515 08/27/15 0452  WBC 8.5 10.2 15.8*  HGB 11.4* 11.4* 12.2*  HCT 34.6* 34.8* 37.5*  PLT 84* 97* 120*   Coag's  Recent Labs Lab 08/21/15 1247  APTT 28  INR 1.09   BMET  Recent Labs Lab 08/25/15 0503 08/26/15 0515 08/26/15 1819 08/27/15 0452  NA 143 142  --  145  K 3.4* 3.1* 3.6 3.4*  CL 106 107  --  112*  CO2 28 28  --  23  BUN 38* 36*  --  52*  CREATININE 4.18* 3.89*  --  4.52*  GLUCOSE 95 132*  --  133*   Electrolytes  Recent Labs Lab 08/21/15 1247  08/25/15 0503 08/26/15 0515 08/26/15 1819 08/27/15 0452  CALCIUM 9.1  < > 7.5* 8.1*  --  8.3*  MG 2.7*  --   --   --  2.0  --   PHOS  --   < > 3.7 2.9  --  2.8  < > = values in this interval not displayed. Sepsis Markers  Recent Labs Lab 08/22/15 0022 08/22/15  0240 08/22/15 0550  LATICACIDVEN 2.1* 2.1* 2.0   ABG  Recent Labs Lab 08/21/15 2002 08/22/15 1010 08/23/15 0920  PHART 7.33* 7.34* 7.35  PCO2ART 41 44 41  PO2ART 142* 103 124*   Liver Enzymes  Recent Labs Lab 08/25/15 0459 08/25/15 0503 08/26/15 0515 08/27/15 0452  AST 1376*  --  940* 693*  ALT 548*  --  411* 320*  ALKPHOS 121  --  375* 375*  BILITOT 1.1  --  0.6 1.1  ALBUMIN 2.4* 2.3* 2.1*  2.1* 2.1*  2.1*   Cardiac Enzymes  Recent Labs Lab 08/21/15 1247 08/21/15 2031 08/22/15 0240  TROPONINI 1.56* 1.73* 1.79*   Glucose  Recent Labs Lab 08/26/15 1119 08/26/15 1604 08/26/15 1941 08/26/15 2253 08/27/15 0351 08/27/15 0751  GLUCAP 97 123* 123* 114* 125* 128*     Recent Results (from the past 240 hour(s))  Blood Culture (routine x 2)     Status: None (Preliminary result)   Collection Time: 08/21/15 12:47 PM    Result Value Ref Range Status   Specimen Description BLOOD UNKNOWN  Final   Special Requests BOTTLES DRAWN AEROBIC AND ANAEROBIC  1CC  Final   Culture NO GROWTH 4 DAYS  Final   Report Status PENDING  Incomplete  Blood Culture (routine x 2)     Status: None (Preliminary result)   Collection Time: 08/21/15 12:47 PM  Result Value Ref Range Status   Specimen Description BLOOD LEFT AC  Final   Special Requests BOTTLES DRAWN AEROBIC AND ANAEROBIC  6CC  Final   Culture NO GROWTH 4 DAYS  Final   Report Status PENDING  Incomplete  Urine culture     Status: None   Collection Time: 08/21/15 12:47 PM  Result Value Ref Range Status   Specimen Description URINE, CATHETERIZED  Final   Special Requests Normal  Final   Culture NO GROWTH 2 DAYS  Final   Report Status 08/23/2015 FINAL  Final  MRSA PCR Screening     Status: None   Collection Time: 08/21/15 11:47 PM  Result Value Ref Range Status   MRSA by PCR NEGATIVE NEGATIVE Final    Comment:        The GeneXpert MRSA Assay (FDA approved for NASAL specimens only), is one component of a comprehensive MRSA colonization surveillance program. It is not intended to diagnose MRSA infection nor to guide or monitor treatment for MRSA infections.   C difficile quick scan w PCR reflex     Status: Abnormal   Collection Time: 08/22/15  2:31 PM  Result Value Ref Range Status   C Diff antigen POSITIVE (A) NEGATIVE Final   C Diff toxin POSITIVE (A) NEGATIVE Final   C Diff interpretation   Final    Positive for toxigenic C. difficile, active toxin production present.    Comment: CRITICAL RESULT CALLED TO, READ BACK BY AND VERIFIED WITH: CHARLIE FLEETWOOD,RN 08/22/2015 1544 BY J RAZZAKSUAREZ,MT   Culture, bal-quantitative     Status: None (Preliminary result)   Collection Time: 08/25/15  3:15 PM  Result Value Ref Range Status   Specimen Description BRONCHIAL ALVEOLAR LAVAGE  Final   Special Requests NONE  Final   Gram Stain PENDING  Incomplete    Culture HOLDING FOR POSSIBLE PATHOGEN  Final   Report Status PENDING  Incomplete     Current facility-administered medications:  .  0.9 %  sodium chloride infusion, , Intravenous, Continuous, Erin Fulling, MD, Last Rate: 150 mL/hr at 08/26/15 2353 .  acetaminophen (TYLENOL) tablet 650 mg, 650 mg, Oral, Q6H PRN **OR** acetaminophen (TYLENOL) suppository 650 mg, 650 mg, Rectal, Q6H PRN, Curvin Hunger, MD .  antiseptic oral rinse solution (CORINZ), 7 mL, Mouth Rinse, QID, Gale Journey, MD, 7 mL at 08/27/15 0455 .  chlorhexidine gluconate (PERIDEX) 0.12 % solution 15 mL, 15 mL, Mouth Rinse, BID, Gale Journey, MD, 15 mL at 08/26/15 2258 .  dexmedetomidine (PRECEDEX) 400 MCG/100ML (4 mcg/mL) infusion, 0.4-1.2 mcg/kg/hr, Intravenous, Titrated, Pauletta Browns, MD, Last Rate: 19 mL/hr at 08/27/15 0628, 0.7 mcg/kg/hr at 08/27/15 0628 .  famotidine (PEPCID) tablet 20 mg, 20 mg, Oral, Q24H, Elvin Mccartin, MD, 20 mg at 08/25/15 1723 .  fentaNYL in NS (69mcg/ml) infusion-PREMIX, 100 mcg/hr, Intravenous, Continuous, Loleta Rose, MD, Last Rate: 40 mL/hr at 08/26/15 0457, 400 mcg/hr at 08/26/15 0457 .  insulin aspart (novoLOG) injection 0-5 Units, 0-5 Units, Subcutaneous, QHS, Gale Journey, MD, 0 Units at 08/21/15 2318 .  insulin aspart (novoLOG) injection 0-9 Units, 0-9 Units, Subcutaneous, TID WC, Gale Journey, MD, 1 Units at 08/26/15 1622 .  ipratropium-albuterol (DUONEB) 0.5-2.5 (3) MG/3ML nebulizer solution 3 mL, 3 mL, Nebulization, Q4H PRN, Avonte Sensabaugh, MD .  meropenem (MERREM) 1 g in sodium chloride 0.9 % 100 mL IVPB, 1 g, Intravenous, Q24H, Gale Journey, MD .  methylPREDNISolone sodium succinate (SOLU-MEDROL) 40 mg/mL injection 40 mg, 40 mg, Intravenous, Q6H, Tauna Macfarlane, MD, 40 mg at 08/27/15 0454 .  midazolam (VERSED) 50 mg in sodium chloride 0.9 % 50 mL (1 mg/mL) infusion, 1 mg/hr, Intravenous, Continuous, Loleta Rose, MD, Last Rate: 2 mL/hr at 08/25/15 2310,  2 mg/hr at 08/25/15 2310 .  vancomycin (VANCOCIN) 50 mg/mL oral solution 125 mg, 125 mg, Oral, 4 times per day, Stephanie Acre, MD, 125 mg at 08/25/15 2300 .  vancomycin (VANCOCIN) 500 mg in sodium chloride irrigation 0.9 % 100 mL ENEMA, 500 mg, Rectal, 4 times per day, Stephanie Acre, MD, 500 mg at 08/26/15 1846 .  [START ON 08/29/2015] vancomycin (VANCOCIN) IVPB 1000 mg/200 mL premix, 1,000 mg, Intravenous, Q72H, Gale Journey, MD  IMAGING    No results found.    Indwelling Urinary Catheter continued, requirement due to   Reason to continue Indwelling Urinary Catheter for strict Intake/Output monitoring for hemodynamic instability         Ventilator continued, requirement due to, resp failure    Ventilator Sedation RASS 0 to -2   MAJOR EVENTS/TEST RESULTS: 11/4 intubated 11/5 failed vent wean 11/8 bronchoscopy 11/9 self extubated  INDWELLING DEVICES:: 11/6 RIJ CVL, R Fem Vein VasCath>>  MICRO DATA: MRSA PCR >>neg Urine 11/4>>negative Blood x 2 11/4>>No growth to date Resp BAL 11/8>> FLU H1N1>> NEG c diff + (antigend and toxin) 11/5  ANTIMICROBIALS:  Oral vanc>>11/5 Flagy>>11/5    ASSESSMENT/PLAN   49 yo AAM with acute resp failure with acute encephalopathy from acute sepsis from acute recurrent c diff colitis With acute renal failure from rhabdomyolysis with acute drug abuse with etoh abuse  PULMONARY-failed vent wean due to encephalopathy and acidosis Respiratory Failure - resolved - self extubated on 11/9, now with good respiratory status, transitioned to [place], awake - continue Bronchodilator Therapy - Follow-up BAL - swallow eval - passed  CARDIOVASCULAR -blood pressure stable -not on vasopressors -IVF's  RENAL-Rhabdomyolysis - ck's trending down Acute renal failure from ATN -currently on intermittent HD - making urine now, will hold off on HD -trend and follow CK levels. (trending down) -appreciate nephro input  GASTROINTESTINAL GI  prophylaxis  HEMATOLOGIC Decreased PLT count - 120 today - improving -stop heparin infusion   INFECTIOUS + cdiff - vancomycin and flagyl  ENDOCRINE - ICU hypoglycemic\Hyperglycemia protocol   NEUROLOGIC - intubated and sedated - minimal sedation to achieve a RASS goal: -1  TTP less likely at this time: continue current care and management  I have personally obtained a history, examined the patient, evaluated laboratory and independently reviewed  imaging results, formulated the assessment and plan and placed orders.  The Patient requires high complexity decision making for assessment and support, frequent evaluation and titration of therapies, application of advanced monitoring technologies and extensive interpretation of multiple databases. Critical Care Time devoted to patient care services described in this note is 35 minutes.   Overall, patient is critically ill, prognosis is guarded. Patient at high risk for cardiac arrest and death.   Stephanie AcreVishal Westlee Devita, MD [facility] Pager [phone] (please enter 7-digits) On Call Pager - [phone] (please enter 7-digits)

## 2015-08-27 NOTE — Evaluation (Signed)
Clinical/Bedside Swallow Evaluation Patient Details  Name: Ross [name] MRN: [id number] Date of Birth: November 14, 1965  Today's Date: 08/27/2015 Time: SLP Start Time (ACUTE ONLY): 0830 SLP Stop Time (ACUTE ONLY): 0930 SLP Time Calculation (min) (ACUTE ONLY): 60 min  Past Medical History:  Past Medical History  Diagnosis Date  . Sleep apnea     had surgery to correct it  . Mental disorder     PTSD  . Seizures (HCC)     currently dx with seizures  . Cardiac arrest (HCC) 08/23/2011  . Pneumonia 09/04/2011  . Pancreatitis   . SOB (shortness of breath) 09/11/2011  . Hypertension   . Anxiety   . Myocardial infarction (HCC)   . Schizophrenia (HCC)   . Rhabdomyolysis 08/23/2015  . Acute renal failure (HCC) 08/23/2015   Past Surgical History:  Past Surgical History  Procedure Laterality Date  . Tonsillectomy    . Uvulopalatopharyngoplasty, tonsillectomy and septoplasty  06/16/2000  . Left heart catheterization with coronary angiogram N/A 08/24/2011    Procedure: LEFT HEART CATHETERIZATION WITH CORONARY ANGIOGRAM;  Surgeon: Corky CraftsJayadeep S Varanasi, MD;  [facility];  Service: Cardiovascular;  Laterality: N/A;  . Intubation  08/26/2015        HPI:  Pt is a 49 y.o. male with a known c/o "heavy" alcohol abuse, schizophrenia, seizure disorder, coronary artery disease status post MI in 2012 resents by EMS after being found unresponsive in his apartment. He has had no contact with anyone for at least 24 hours. EMS reported seizure-like activity on arrival and during transport. On arrival to the emergency room he was able to state his name and follow simple Commands. He was very lethargic and responding minimally. He was hypoxic with oxygen saturations of 88% on 6 L. He was intubated in the emergency room by Dr. York CeriseForbach due to respiratory failure with hypoxia. On presentation his clothes were soaked with urine and loose feces. He is being admitted for sepsis with high fever of 103, lactate of  2.3, multiorgan failure. Pt self extubated and is currently NPO; more awake and following commands.    Assessment / Plan / Recommendation Clinical Impression  Pt presented w/ adequate oropharngeal phases of swallowing demo. no overt s/s of aspiration during po trials; oral phase was appropriate for bolus management, mastication, and oral clearing. Pt required assistance w/ feeding but was able to hold cup and finger food item. Pt requires min. verbal cue for attention to task when taking po trial; suspect related to recent illness requiring oral intubation and mental status baseline. Pt appears at reduced risk for aspiration following general aspiration precautions. NSG present during session and updated.       Aspiration Risk   (reduced aspiration risk following general aspiration precs.)    Diet Recommendation     Medication Administration: Whole meds with liquid (w/ puree if nec.)    Other  Recommendations Recommended Consults:  (Dietician as appropriate) Oral Care Recommendations: Oral care BID;Staff/trained caregiver to provide oral care   Follow up Recommendations   (TBD)    Frequency and Duration min 3x week  1 week       Swallow Study   General Date of Onset: 08/21/15 HPI: Pt is a 49 y.o. male with a known c/o "heavy" alcohol abuse, schizophrenia, seizure disorder, coronary artery disease status post MI in 2012 resents by EMS after being found unresponsive in his apartment. He has had no contact with anyone for at least 24 hours. EMS reported seizure-like  activity on arrival and during transport. On arrival to the emergency room he was able to state his name and follow simple Commands. He was very lethargic and responding minimally. He was hypoxic with oxygen saturations of 88% on 6 L. He was intubated in the emergency room by Dr. York Cerise due to respiratory failure with hypoxia. On presentation his clothes were soaked with urine and loose feces. He is being admitted for sepsis with  high fever of 103, lactate of 2.3, multiorgan failure. Pt self extubated and is currently NPO; more awake and following commands.  Type of Study: Bedside Swallow Evaluation Previous Swallow Assessment: none Diet Prior to this Study: Regular;Thin liquids (per pt prior to admission) Temperature Spikes Noted: Yes (temps 99.5; wbc 15.8) Respiratory Status: Nasal cannula (2 liters) History of Recent Intubation: Yes Length of Intubations (days): 5 days Date extubated: 08/26/15 Behavior/Cognition: Alert;Cooperative;Confused;Distractible;Requires cueing Oral Cavity Assessment: Within Functional Limits Oral Care Completed by SLP: Yes Oral Cavity - Dentition: Adequate natural dentition Vision: Functional for self-feeding Self-Feeding Abilities: Needs assist;Needs set up;Total assist Patient Positioning: Upright in bed Baseline Vocal Quality: Low vocal intensity Volitional Cough: Strong Volitional Swallow: Able to elicit    Oral/Motor/Sensory Function Overall Oral Motor/Sensory Function: Within functional limits   Ice Chips Ice chips: Not tested (pt refused)   Thin Liquid Thin Liquid: Within functional limits Presentation: Cup;Straw (pt assisted in holding cup w/ SLP) Other Comments: ~6 ozs total    Nectar Thick Nectar Thick Liquid: Not tested   Honey Thick Honey Thick Liquid: Not tested   Puree Puree: Within functional limits Presentation: Spoon (fed; 4 trials)   Solid Solid: Within functional limits Presentation:  (fed; 3 cracker bites w/ ice cream) Other Comments: pt bit cracker and masticated appropriately      Jerilynn Som, MS, CCC-SLP  Ross [name] 08/27/2015,10:43 AM

## 2015-08-27 NOTE — Progress Notes (Signed)
[facility] [place] [facility]   PATIENT NAME: Ross ManorJames [name]    MR#:  [id number]  DATE OF BIRTH:  December 08, 1965  SUBJECTIVE:  CHIEF COMPLAINT:   Chief Complaint  Patient presents with  . Seizures   Sitting up eating lunch. States that his whole body is sore. Calm, no distress.  REVIEW OF SYSTEMS:   Review of Systems  Constitutional: Positive for fever and chills.  Respiratory: Positive for cough and sputum production. Negative for shortness of breath and wheezing.   Cardiovascular: Negative for chest pain, palpitations and leg swelling.  Gastrointestinal: Negative for nausea, vomiting and abdominal pain.  Genitourinary: Negative for dysuria.  Musculoskeletal: Positive for myalgias.     DRUG ALLERGIES:   Allergies  Allergen Reactions  . Antihistamines, Diphenhydramine-Type Anaphylaxis and Rash  . Benadryl [Diphenhydramine Hcl] Anaphylaxis and Rash  . Hydroxyzine Anaphylaxis and Rash    VITALS:  Blood pressure 151/89, pulse 66, temperature 99.1 F (37.3 C), temperature source Core (Comment), resp. rate 30, height 5\' 11"  (1.803 m), weight 114.9 kg (253 lb 4.9 oz), SpO2 99 %.  PHYSICAL EXAMINATION:  GENERAL:  49 y.o.-year-old patient weak, sitting up eating lunch with assistance LUNGS: CTAB, good air movement CARDIOVASCULAR: S1, S2 normal. No murmurs, rubs, or gallops.  ABDOMEN: Soft, nontender, nondistended. Bowel sounds present. No organomegaly or mass.  EXTREMITIES: No pedal edema, cyanosis, or clubbing. Peripheral pulses 2+ NEUROLOGIC: alert, CN's grossly intact, moves all extremities, oriented to person, follows comands SKIN: No obvious rash, lesion, or ulcer.    LABORATORY PANEL:   CBC  Recent Labs Lab 08/27/15 0452  WBC 15.8*  HGB 12.2*  HCT 37.5*  PLT 120*   ------------------------------------------------------------------------------------------------------------------  Chemistries   Recent Labs Lab 08/26/15 1819  08/27/15 0452  NA  --  145  K 3.6 3.4*  CL  --  112*  CO2  --  23  GLUCOSE  --  133*  BUN  --  52*  CREATININE  --  4.52*  CALCIUM  --  8.3*  MG 2.0  --   AST  --  693*  ALT  --  320*  ALKPHOS  --  375*  BILITOT  --  1.1   ------------------------------------------------------------------------------------------------------------------  Cardiac Enzymes  Recent Labs Lab 08/22/15 0240  TROPONINI 1.79*   ------------------------------------------------------------------------------------------------------------------  RADIOLOGY:  Dg Abd 1 View  08/26/2015  CLINICAL DATA:  Nasogastric tube placement EXAM: ABDOMEN - 1 VIEW COMPARISON:  07/21/2015 FINDINGS: Motion degraded study showing nasogastric tube tip at the proximal stomach. Dialysis catheter by right femoral approach, tip at the L2-3 disc level. Moderate gaseous distension of bowel with nonobstructive pattern. IMPRESSION: Nasogastric tube tip at the proximal stomach. Electronically Signed   By: Marnee SpringJonathon  Watts M.D.   On: 08/26/2015 05:47   Dg Chest Port 1 View  08/26/2015  CLINICAL DATA:  Self extubated EXAM: PORTABLE CHEST 1 VIEW COMPARISON:  Yesterday FINDINGS: Replaced endotracheal tube tip in good position between clavicular heads and carina. Right IJ central line remains in good position, tip at the upper right atrium. An orogastric tube at least reaches the stomach. New hazy opacity at the bases with bandlike atelectasis along the right minor fissure. No overt edema. No effusion or pneumothorax. IMPRESSION: 1. Replaced endotracheal tube in good position. 2. New basilar lung opacities favor atelectasis or aspiration given the circumstances. Electronically Signed   By: Marnee SpringJonathon  Watts M.D.   On: 08/26/2015 05:46    EKG:   Orders placed or performed during  the hospital encounter of 08/21/15  . EKG 12-Lead  . EKG 12-Lead    ASSESSMENT AND PLAN:   #1 fever/sepsis: Likely due to C. difficile colitis - Influenza  negative, urine and blood cultures negative, HIV hepatitis panel negative - Clostridium difficile positive on oral vancomycin, temperature remains 100  - Bronchoscopy today BAL culture NTD, UA on 11/6 positive, started on meropenem and vancomycin empirically but now dc'ed - fever may also be related to ETOH withdrawal   #2 acute respiratory failure with hypoxia and altered mental status: resolved - Pulmonology following, bronchoscopy 11/8 with BAL, thick secretions in right airway  #3 rhabdomyolysis: CK trending down - Possibly due to prolonged immobilization, hypotension, diarrhea, possible withdrawal seizures prior to admission. Patient does not remember time prior to admission. - Continue hydration, HD, CK decreasing slowly  #4 acute renal failure: Temp HD catheter placed and will have treatments as needed. - Due to ATN, rhabdomyolysis - Appreciate nephrology following - Continue IV fluids and bicarbonate - HD held today  #5 thrombocytopenia: Improving - Possibly due to sepsis in the setting of C. difficile infection - patient also with history of heavy ETOH use. - stable, no bleeding noted  #6 elevated troponin - No concerning EKG changes, no events on telemetry.  - Heparin drip stopped due to thrombocytopenia - 2-D echocardiogram is normal with no focal wall motion abnormalities - Likely related to rhabdo - appreciate cardiology consultation  #7 altered mental status - responding appropriately today  #8 transaminitis: improving - Viral hepatitis panel negative - Some of the AST likely due to rhabdomyolysis - Likely shock liver as well as possible ETOH hepatitis  All the records are reviewed and case discussed with Care Management/Social Workerr. Management plans discussed with the patient, family and they are in agreement.  CODE STATUS: Full   TOTAL CRITICAL CARE TIME TAKING CARE OF THIS PATIENT: 35 minutes.  Greater than 50% of time spent in care coordination and  counseling. POSSIBLE D/C IN ? DAYS, DEPENDING ON CLINICAL CONDITION.   Elby Showers M.D on 08/27/2015 at 9:08 PM  Between 7am to 6pm - Pager - [phone]  After 6pm go to [email or web address] - password [email or web address] [facility]  [facility] [place] [facility]  Office  [phone]  CC: Primary care physician; No primary care provider on file.

## 2015-08-27 NOTE — Progress Notes (Signed)
Per night RN, pt unable to give enema due to pt refusal verbally and physically, this RN called MD with update, swallow eval order placed, plan to give pt po if passes  Swallow eval

## 2015-08-28 LAB — COMPREHENSIVE METABOLIC PANEL
ALT: 270 U/L — ABNORMAL HIGH (ref 17–63)
ANION GAP: 9 (ref 5–15)
AST: 474 U/L — ABNORMAL HIGH (ref 15–41)
Albumin: 2 g/dL — ABNORMAL LOW (ref 3.5–5.0)
Alkaline Phosphatase: 361 U/L — ABNORMAL HIGH (ref 38–126)
BUN: 70 mg/dL — ABNORMAL HIGH (ref 6–20)
CALCIUM: 8.4 mg/dL — AB (ref 8.9–10.3)
CHLORIDE: 115 mmol/L — AB (ref 101–111)
CO2: 21 mmol/L — AB (ref 22–32)
CREATININE: 4.72 mg/dL — AB (ref 0.61–1.24)
GFR, EST AFRICAN AMERICAN: 16 mL/min — AB (ref >60)
GFR, EST NON AFRICAN AMERICAN: 13 mL/min — AB (ref >60)
Glucose, Bld: 152 mg/dL — ABNORMAL HIGH (ref 65–99)
Potassium: 3.6 mmol/L (ref 3.5–5.1)
SODIUM: 145 mmol/L (ref 135–145)
Total Bilirubin: 1 mg/dL (ref 0.3–1.2)
Total Protein: 5.4 g/dL — ABNORMAL LOW (ref 6.5–8.1)

## 2015-08-28 LAB — GLUCOSE, CAPILLARY
GLUCOSE-CAPILLARY: 154 mg/dL — AB (ref 65–99)
GLUCOSE-CAPILLARY: 134 mg/dL — AB (ref 65–99)
GLUCOSE-CAPILLARY: 135 mg/dL — AB (ref 65–99)
GLUCOSE-CAPILLARY: 167 mg/dL — AB (ref 65–99)
Glucose-Capillary: 151 mg/dL — ABNORMAL HIGH (ref 65–99)

## 2015-08-28 LAB — CBC
HCT: 37.7 % — ABNORMAL LOW (ref 40.0–52.0)
HCT: 38.9 % — ABNORMAL LOW (ref 40.0–52.0)
HEMOGLOBIN: 13.1 g/dL (ref 13.0–18.0)
Hemoglobin: 12.3 g/dL — ABNORMAL LOW (ref 13.0–18.0)
MCH: 28.8 pg (ref 26.0–34.0)
MCH: 29.4 pg (ref 26.0–34.0)
MCHC: 32.6 g/dL (ref 32.0–36.0)
MCHC: 33.5 g/dL (ref 32.0–36.0)
MCV: 88.5 fL (ref 80.0–100.0)
MCV: 87.7 fL (ref 80.0–100.0)
PLATELETS: 145 10*3/uL — AB (ref 150–440)
Platelets: 167 10*3/uL (ref 150–440)
RBC: 4.26 MIL/uL — ABNORMAL LOW (ref 4.40–5.90)
RBC: 4.44 MIL/uL (ref 4.40–5.90)
RDW: 14.3 % (ref 11.5–14.5)
RDW: 14.4 % (ref 11.5–14.5)
WBC: 17.4 10*3/uL — ABNORMAL HIGH (ref 3.8–10.6)
WBC: 21.5 10*3/uL — ABNORMAL HIGH (ref 3.8–10.6)

## 2015-08-28 LAB — CK
CK TOTAL: 5385 U/L — AB (ref 49–397)
CK TOTAL: 5313 U/L — AB (ref 49–397)

## 2015-08-28 MED ORDER — INFLUENZA VAC SPLIT QUAD 0.5 ML IM SUSY
0.5000 mL | PREFILLED_SYRINGE | INTRAMUSCULAR | Status: AC | PRN
  Administered 2015-09-14: 0.5 mL via INTRAMUSCULAR
  Filled 2015-08-28: qty 0.5

## 2015-08-28 MED ORDER — PREDNISONE 20 MG PO TABS: 30.0000 mg | ORAL_TABLET | Freq: Every day | ORAL | Status: DC

## 2015-08-28 MED ORDER — HYDRALAZINE HCL 20 MG/ML IJ SOLN: 20.0000 mg | INTRAMUSCULAR | Status: DC | PRN

## 2015-08-28 MED ORDER — ONDANSETRON HCL 4 MG/2ML IJ SOLN
INTRAMUSCULAR | Status: AC
  Filled 2015-08-28: qty 2

## 2015-08-28 MED ORDER — PREDNISONE 10 MG PO TABS: 10.0000 mg | ORAL_TABLET | Freq: Every day | ORAL | Status: DC

## 2015-08-28 MED ORDER — PNEUMOCOCCAL VAC POLYVALENT 25 MCG/0.5ML IJ INJ: 0.5000 mL | INJECTION | INTRAMUSCULAR | Status: DC | PRN

## 2015-08-28 MED ORDER — PREDNISONE 20 MG PO TABS: 20.0000 mg | ORAL_TABLET | Freq: Every day | ORAL | Status: DC

## 2015-08-28 MED ORDER — PREDNISONE 20 MG PO TABS
40.0000 mg | ORAL_TABLET | Freq: Every day | ORAL | Status: DC
  Administered 2015-08-29: 40 mg via ORAL
  Filled 2015-08-28: qty 2

## 2015-08-28 MED ORDER — ONDANSETRON HCL 4 MG/2ML IJ SOLN
4.0000 mg | Freq: Three times a day (TID) | INTRAMUSCULAR | Status: DC | PRN
  Administered 2015-08-28: 4 mg via INTRAVENOUS

## 2015-08-28 NOTE — Progress Notes (Signed)
Subjective:   Intermittent confusion. Sitter at bedside.  Last hemodialysis treatment was 11/8 UOP 1250. BUN and creatinine rising  NS at 13650mL/hr    Objective:  Vital signs in last 24 hours:  Temp:  [98.4 F (36.9 C)-99.5 F (37.5 C)] 98.6 F (37 C) (11/11 0700) Pulse Rate:  [42-80] 46 (11/11 0700) Resp:  [15-35] 17 (11/11 0700) BP: (133-176)/(77-145) 155/95 mmHg (11/11 0700) SpO2:  [94 %-99 %] 97 % (11/11 0700) Weight:  [122.5 kg (270 lb 1 oz)] 122.5 kg (270 lb 1 oz) (11/11 0520)  Weight change: 7.6 kg (16 lb 12.1 oz) Filed Weights   08/25/15 0945 08/26/15 1400 08/28/15 0520  Weight: 110.8 kg (244 lb 4.3 oz) 114.9 kg (253 lb 4.9 oz) 122.5 kg (270 lb 1 oz)    Intake/Output:    Intake/Output Summary (Last 24 hours) at 08/28/15 0824 Last data filed at 08/28/15 [id number]  Gross per 24 hour  Intake 13742.37 ml  Output   1000 ml  Net 12742.37 ml     Physical Exam: General:  laying in bed,   HEENT  PERRL, dry mucosal membranes  Neck  supple  Pulm/lungs  clear, room air  CVS/Heart  regular  Abdomen:   soft, +bowel sounds, nontender  Extremities:  + peripheral edema  Neurologic:  answering commands, not oriented  Skin:  no acute rashes  Access: Right femoral non tunneled catheter Dr. Belia HemanKasa 11/6       Basic Metabolic Panel:   Recent Labs Lab 08/21/15 1247  08/23/15 1346 08/24/15 0943 08/25/15 0503 08/26/15 0515 08/26/15 1819 08/27/15 0452 08/28/15 0500  NA 145  < > 144 143 143 142  --  145 145  K 3.7  < > 4.2 3.7 3.4* 3.1* 3.6 3.4* 3.6  CL 104  < > 107 110 106 107  --  112* 115*  CO2 19*  < > 24 24 28 28   --  23 21*  GLUCOSE 90  < > 97 95 95 132*  --  133* 152*  BUN 38*  < > 45* 45* 38* 36*  --  52* 70*  CREATININE 2.91*  < > 4.15* 4.68* 4.18* 3.89*  --  4.52* 4.72*  CALCIUM 9.1  < > 6.5* 6.6* 7.5* 8.1*  --  8.3* 8.4*  MG 2.7*  --   --   --   --   --  2.0  --   --   PHOS  --   --  4.7* 3.3 3.7 2.9  --  2.8  --   < > = values in this interval not  displayed.   CBC:  Recent Labs Lab 08/21/15 1247  08/23/15 0139 08/24/15 0943 08/25/15 0459 08/26/15 0515 08/27/15 0452 08/28/15 0500  WBC 5.8  < > 5.7 8.5 8.5 10.2 15.8* 17.4*  NEUTROABS 4.7  --  4.9  --   --   --   --   --   HGB 15.9  < > 13.6 11.4* 11.4* 11.4* 12.2* 12.3*  HCT 47.2  < > 41.8 34.3* 34.6* 34.8* 37.5* 37.7*  MCV 90.9  < > 91.7 90.1 90.8 89.7 88.7 88.5  PLT PLATELET CLUMPS NOTED ON SMEAR, COUNT APPEARS DECREASED  < > 51* 66* 84* 97* 120* 145*  < > = values in this interval not displayed.    Microbiology:  Recent Results (from the past 720 hour(s))  Blood Culture (routine x 2)     Status: None (Preliminary result)   Collection Time: 08/21/15  12:47 PM  Result Value Ref Range Status   Specimen Description BLOOD UNKNOWN  Final   Special Requests BOTTLES DRAWN AEROBIC AND ANAEROBIC  1CC  Final   Culture NO GROWTH 4 DAYS  Final   Report Status PENDING  Incomplete  Blood Culture (routine x 2)     Status: None (Preliminary result)   Collection Time: 08/21/15 12:47 PM  Result Value Ref Range Status   Specimen Description BLOOD LEFT AC  Final   Special Requests BOTTLES DRAWN AEROBIC AND ANAEROBIC  6CC  Final   Culture NO GROWTH 4 DAYS  Final   Report Status PENDING  Incomplete  Urine culture     Status: None   Collection Time: 08/21/15 12:47 PM  Result Value Ref Range Status   Specimen Description URINE, CATHETERIZED  Final   Special Requests Normal  Final   Culture NO GROWTH 2 DAYS  Final   Report Status 08/23/2015 FINAL  Final  MRSA PCR Screening     Status: None   Collection Time: 08/21/15 11:47 PM  Result Value Ref Range Status   MRSA by PCR NEGATIVE NEGATIVE Final    Comment:        The GeneXpert MRSA Assay (FDA approved for NASAL specimens only), is one component of a comprehensive MRSA colonization surveillance program. It is not intended to diagnose MRSA infection nor to guide or monitor treatment for MRSA infections.   C difficile quick  scan w PCR reflex     Status: Abnormal   Collection Time: 08/22/15  2:31 PM  Result Value Ref Range Status   C Diff antigen POSITIVE (A) NEGATIVE Final   C Diff toxin POSITIVE (A) NEGATIVE Final   C Diff interpretation   Final    Positive for toxigenic C. difficile, active toxin production present.    Comment: CRITICAL RESULT CALLED TO, READ BACK BY AND VERIFIED WITH: CHARLIE FLEETWOOD,RN 08/22/2015 1544 BY J RAZZAKSUAREZ,MT   Culture, bal-quantitative     Status: None   Collection Time: 08/25/15  3:15 PM  Result Value Ref Range Status   Specimen Description BRONCHIAL ALVEOLAR LAVAGE  Final   Special Requests NONE  Final   Gram Stain   Final    GOOD SPECIMEN - 80-90% WBCS FEW WBC SEEN NO ORGANISMS SEEN    Culture HOLDING FOR POSSIBLE PATHOGEN  Final   Report Status 08/27/2015 FINAL  Final    Coagulation Studies: No results for input(s): LABPROT, INR in the last 72 hours.  Urinalysis: No results for input(s): COLORURINE, LABSPEC, PHURINE, GLUCOSEU, HGBUR, BILIRUBINUR, KETONESUR, PROTEINUR, UROBILINOGEN, NITRITE, LEUKOCYTESUR in the last 72 hours.  Invalid input(s): APPERANCEUR    Imaging: No results found.   Medications:   . sodium chloride 150 mL/hr at 08/28/15 0545  . dexmedetomidine 0.7 mcg/kg/hr (08/28/15 0100)  . fentaNYL infusion INTRAVENOUS 400 mcg/hr (08/26/15 0457)  . midazolam (VERSED) infusion 2 mg/hr (08/25/15 2310)   . antiseptic oral rinse  7 mL Mouth Rinse QID  . chlorhexidine gluconate  15 mL Mouth Rinse BID  . famotidine  20 mg Oral Q24H  . feeding supplement (ENSURE ENLIVE)  237 mL Oral BID WC  . insulin aspart  0-5 Units Subcutaneous QHS  . insulin aspart  0-9 Units Subcutaneous TID WC  . methylPREDNISolone (SOLU-MEDROL) injection  40 mg Intravenous Q6H  . vancomycin  125 mg Oral 4 times per day   acetaminophen **OR** acetaminophen, ipratropium-albuterol  Assessment/ Plan:  49 y.o. male With alcohol abuse, schizophrenia, seizure disorder,  coronary disease/MI 2012 was admitted to [facility] on November 4 after being found unresponsive in his apartment. Urine toxicology screen is positive for benzodiazepines and cannabinoids. Stool positive for C. Difficile  1. Acute renal failure, likely secondary to ATN from concurrent illness, volume depletion, C. difficile, rhabdomyolysis and sepsis.  Urine output is nonoliguric. Serum creatinine and BUN rising. Will hold dialysis and monitor for dialysis need. No acute indication CK level is improving.  Hemodialysis initial treatment for acute renal failure 11/6, 11/7, 11/8  Plan on holding dialysis for today. Will monitor closely Monitor volume status, urine output and serum electrolytes.   2. Acute rhabdomyolysis M62.82: with elevated transaminases and hypocalcemia Continue to monitor CK levels and liver function daily. Improving.  IV NS at 150  3. Hypertension: blood pressure at goal.   4. Sepsis: empiric treatment with meropenem and IV vancomycin  5. Infective Colitis A09: c. Diff positive.  - PO vancomycin     LOS: 7 Ross [name] 11/11/20168:24 AM

## 2015-08-28 NOTE — Progress Notes (Signed)
Pt with 450 urine output since beginning of shift 0700-1500(8hrs), decrease form previous documentation, MD/N

## 2015-08-28 NOTE — Consult Note (Signed)
[facility] Pulmonary Medicine Consultation      Name: Ross [name] MRN: [id number] DOB: 04-Aug-1966    ADMISSION DATE:  08/21/2015   CHIEF COMPLAINT:    acute resp failure   SUBJECTIVE   Patient more awake, alert today. Able to vocalize needs, still with some mild confusion but significantly improved mentation. Sitter at bedside, patient pulled out peripheral IV last night   SIGNIFICANT EVENTS   11/4 intubated 11/5 failed vent wean due to encephalopathy and acidosis 11/6 RIJ CVL, R Fem Vein VasCath 11/8 bronchoscopy with BAL 11/9 self extubated  Review of Systems  Constitutional: Negative for fever and chills.  Respiratory: Positive for sputum production.   Cardiovascular: Negative for palpitations.  Gastrointestinal: Negative for heartburn, nausea, vomiting and constipation.  Skin: Negative for itching and rash.      VITAL SIGNS    Temp:  [98.4 F (36.9 C)-99.5 F (37.5 C)] 98.6 F (37 C) (11/11 0700) Pulse Rate:  [42-80] 46 (11/11 0700) Resp:  [15-35] 17 (11/11 0700) BP: (133-176)/(77-145) 155/95 mmHg (11/11 0700) SpO2:  [94 %-99 %] 97 % (11/11 0700) Weight:  [270 lb 1 oz (122.5 kg)] 270 lb 1 oz (122.5 kg) (11/11 0520) HEMODYNAMICS:   VENTILATOR SETTINGS:   INTAKE / OUTPUT:  Intake/Output Summary (Last 24 hours) at 08/28/15 0835 Last data filed at 08/28/15 0823  Gross per 24 hour  Intake 13742.37 ml  Output   1000 ml  Net 12742.37 ml       PHYSICAL EXAM   Physical Exam  Constitutional: No distress.  HENT:  Head: Normocephalic and atraumatic.  Eyes: Pupils are equal, round, and reactive to light.  Neck: Normal range of motion. Neck supple.  Cardiovascular: Normal rate and regular rhythm.   No murmur heard. Pulmonary/Chest: No respiratory distress. He has no wheezes.  Extubated, still with coarse BS, but improving  Abdominal: Soft. He exhibits no distension. There is no tenderness.  Musculoskeletal: He exhibits no edema.    Neurological: He is alert.  More awake and alert today  Skin: Skin is warm and dry. He is not diaphoretic.       LABS   LABS:  CBC  Recent Labs Lab 08/26/15 0515 08/27/15 0452 08/28/15 0500  WBC 10.2 15.8* 17.4*  HGB 11.4* 12.2* 12.3*  HCT 34.8* 37.5* 37.7*  PLT 97* 120* 145*   Coag's  Recent Labs Lab 08/21/15 1247  APTT 28  INR 1.09   BMET  Recent Labs Lab 08/26/15 0515 08/26/15 1819 08/27/15 0452 08/28/15 0500  NA 142  --  145 145  K 3.1* 3.6 3.4* 3.6  CL 107  --  112* 115*  CO2 28  --  23 21*  BUN 36*  --  52* 70*  CREATININE 3.89*  --  4.52* 4.72*  GLUCOSE 132*  --  133* 152*   Electrolytes  Recent Labs Lab 08/21/15 1247  08/25/15 0503 08/26/15 0515 08/26/15 1819 08/27/15 0452 08/28/15 0500  CALCIUM 9.1  < > 7.5* 8.1*  --  8.3* 8.4*  MG 2.7*  --   --   --  2.0  --   --   PHOS  --   < > 3.7 2.9  --  2.8  --   < > = values in this interval not displayed. Sepsis Markers  Recent Labs Lab 08/22/15 0022 08/22/15 0240 08/22/15 0550  LATICACIDVEN 2.1* 2.1* 2.0   ABG  Recent Labs Lab 08/21/15 2002 08/22/15 1010 08/23/15 0920  PHART 7.33* 7.34*  7.35  PCO2ART 41 44 41  PO2ART 142* 103 124*   Liver Enzymes  Recent Labs Lab 08/26/15 0515 08/27/15 0452 08/28/15 0500  AST 940* 693* 474*  ALT 411* 320* 270*  ALKPHOS 375* 375* 361*  BILITOT 0.6 1.1 1.0  ALBUMIN 2.1*  2.1* 2.1*  2.1* 2.0*   Cardiac Enzymes  Recent Labs Lab 08/21/15 1247 08/21/15 2031 08/22/15 0240  TROPONINI 1.56* 1.73* 1.79*   Glucose  Recent Labs Lab 08/27/15 1144 08/27/15 1601 08/27/15 1952 08/27/15 2322 08/28/15 0357 08/28/15 0708  GLUCAP 140* 166* 154* 134* 135* 167*     Recent Results (from the past 240 hour(s))  Blood Culture (routine x 2)     Status: None (Preliminary result)   Collection Time: 08/21/15 12:47 PM  Result Value Ref Range Status   Specimen Description BLOOD UNKNOWN  Final   Special Requests BOTTLES DRAWN AEROBIC AND  ANAEROBIC  1CC  Final   Culture NO GROWTH 4 DAYS  Final   Report Status PENDING  Incomplete  Blood Culture (routine x 2)     Status: None (Preliminary result)   Collection Time: 08/21/15 12:47 PM  Result Value Ref Range Status   Specimen Description BLOOD LEFT AC  Final   Special Requests BOTTLES DRAWN AEROBIC AND ANAEROBIC  6CC  Final   Culture NO GROWTH 4 DAYS  Final   Report Status PENDING  Incomplete  Urine culture     Status: None   Collection Time: 08/21/15 12:47 PM  Result Value Ref Range Status   Specimen Description URINE, CATHETERIZED  Final   Special Requests Normal  Final   Culture NO GROWTH 2 DAYS  Final   Report Status 08/23/2015 FINAL  Final  MRSA PCR Screening     Status: None   Collection Time: 08/21/15 11:47 PM  Result Value Ref Range Status   MRSA by PCR NEGATIVE NEGATIVE Final    Comment:        The GeneXpert MRSA Assay (FDA approved for NASAL specimens only), is one component of a comprehensive MRSA colonization surveillance program. It is not intended to diagnose MRSA infection nor to guide or monitor treatment for MRSA infections.   C difficile quick scan w PCR reflex     Status: Abnormal   Collection Time: 08/22/15  2:31 PM  Result Value Ref Range Status   C Diff antigen POSITIVE (A) NEGATIVE Final   C Diff toxin POSITIVE (A) NEGATIVE Final   C Diff interpretation   Final    Positive for toxigenic C. difficile, active toxin production present.    Comment: CRITICAL RESULT CALLED TO, READ BACK BY AND VERIFIED WITH: CHARLIE FLEETWOOD,RN 08/22/2015 1544 BY J RAZZAKSUAREZ,MT   Culture, bal-quantitative     Status: None   Collection Time: 08/25/15  3:15 PM  Result Value Ref Range Status   Specimen Description BRONCHIAL ALVEOLAR LAVAGE  Final   Special Requests NONE  Final   Gram Stain   Final    GOOD SPECIMEN - 80-90% WBCS FEW WBC SEEN NO ORGANISMS SEEN    Culture HOLDING FOR POSSIBLE PATHOGEN  Final   Report Status 08/27/2015 FINAL  Final      Current facility-administered medications:  .  0.9 %  sodium chloride infusion, , Intravenous, Continuous, Erin FullingKurian Kasa, MD, Last Rate: 150 mL/hr at 08/28/15 0545 .  acetaminophen (TYLENOL) tablet 650 mg, 650 mg, Oral, Q6H PRN, 650 mg at 08/27/15 1954 **OR** acetaminophen (TYLENOL) suppository 650 mg, 650 mg, Rectal, Q6H PRN, Jaceyon Strole  Mckell Riecke, MD .  antiseptic oral rinse solution (CORINZ), 7 mL, Mouth Rinse, QID, Gale Journey, MD, 7 mL at 08/28/15 0414 .  chlorhexidine gluconate (PERIDEX) 0.12 % solution 15 mL, 15 mL, Mouth Rinse, BID, Gale Journey, MD, 15 mL at 08/28/15 0745 .  dexmedetomidine (PRECEDEX) 400 MCG/100ML (4 mcg/mL) infusion, 0.4-1.2 mcg/kg/hr, Intravenous, Titrated, Pauletta Browns, MD, Last Rate: 19 mL/hr at 08/28/15 0100, 0.7 mcg/kg/hr at 08/28/15 0100 .  famotidine (PEPCID) tablet 20 mg, 20 mg, Oral, Q24H, Socorro Ebron, MD, 20 mg at 08/27/15 1800 .  feeding supplement (ENSURE ENLIVE) (ENSURE ENLIVE) liquid 237 mL, 237 mL, Oral, BID WC, Gale Journey, MD, 237 mL at 08/28/15 0800 .  fentaNYL in NS (34mcg/ml) infusion-PREMIX, 100 mcg/hr, Intravenous, Continuous, Loleta Rose, MD, Last Rate: 40 mL/hr at 08/26/15 0457, 400 mcg/hr at 08/26/15 0457 .  insulin aspart (novoLOG) injection 0-5 Units, 0-5 Units, Subcutaneous, QHS, Gale Journey, MD, 0 Units at 08/21/15 2318 .  insulin aspart (novoLOG) injection 0-9 Units, 0-9 Units, Subcutaneous, TID WC, Gale Journey, MD, 2 Units at 08/28/15 [phone] .  ipratropium-albuterol (DUONEB) 0.5-2.5 (3) MG/3ML nebulizer solution 3 mL, 3 mL, Nebulization, Q4H PRN, Mareena Cavan, MD .  methylPREDNISolone sodium succinate (SOLU-MEDROL) 40 mg/mL injection 40 mg, 40 mg, Intravenous, Q6H, Harinder Romas, MD, 40 mg at 08/28/15 0414 .  midazolam (VERSED) 50 mg in sodium chloride 0.9 % 50 mL (1 mg/mL) infusion, 1 mg/hr, Intravenous, Continuous, Loleta Rose, MD, Last Rate: 2 mL/hr at 08/25/15 2310, 2 mg/hr at 08/25/15 2310 .   vancomycin (VANCOCIN) 50 mg/mL oral solution 125 mg, 125 mg, Oral, 4 times per day, Stephanie Acre, MD, 125 mg at 08/28/15 0620  IMAGING    No results found.    Indwelling Urinary Catheter continued, requirement due to   Reason to continue Indwelling Urinary Catheter for strict Intake/Output monitoring for hemodynamic instability         Ventilator continued, requirement due to, resp failure    Ventilator Sedation RASS 0 to -2   MAJOR EVENTS/TEST RESULTS: 11/4 intubated 11/5 failed vent wean 11/8 bronchoscopy 11/9 self extubated  INDWELLING DEVICES:: 11/6 RIJ CVL, R Fem Vein VasCath>>  MICRO DATA: MRSA PCR >>neg Urine 11/4>>negative Blood x 2 11/4>>No growth to date Resp BAL 11/8>>neg FLU H1N1>> NEG c diff + (antigend and toxin) 11/5  ANTIMICROBIALS:  Oral vanc>>11/5 Flagy>>11/5    ASSESSMENT/PLAN   49 yo AAM with acute resp failure with acute encephalopathy from acute sepsis from acute recurrent c diff colitis With acute renal failure from rhabdomyolysis with acute drug abuse with etoh abuse  PULMONARY-failed vent wean due to encephalopathy and acidosis Respiratory Failure - resolved - self extubated on 11/9, now with good respiratory status, transitioned to [place], awake - continue Bronchodilator Therapy - swallow eval - passed - Appearing more alert, stop Precedex - Start weaning IV Solu-Medrol, can switch to by mouth - transfer out ICU in next 24hrs if continues to have clinical improvement.   CARDIOVASCULAR -blood pressure stable -not on vasopressors -IVF's -HTN - prn hydralazine (has low HR).   RENAL Rhabdomyolysis - ck's trending down Acute renal failure from ATN -currently on intermittent HD - making urine now, will hold off on HD -trend and follow CK levels. (trending down) -appreciate nephro input  GASTROINTESTINAL GI prophylaxis  HEMATOLOGIC Decreased PLT count - 145 today - improving -stop heparin infusion   INFECTIOUS + cdiff -  vancomycin and flagyl  ENDOCRINE - ICU hypoglycemic\Hyperglycemia protocol  NEUROLOGIC - RASS GOAL =0 - Becoming more alert and less confused, will stop Precedex today  I have personally obtained a history, examined the patient, evaluated laboratory and independently reviewed  imaging results, formulated the assessment and plan and placed orders.  The Patient requires high complexity decision making for assessment and support, frequent evaluation and titration of therapies, application of advanced monitoring technologies and extensive interpretation of multiple databases.  Critical Care Time devoted to patient care services described in this note is 35 minutes.   Stephanie Acre, MD [place] Pager [phone] (please enter 7-digits) On Call Pager - [phone] (please enter 7-digits)

## 2015-08-28 NOTE — Progress Notes (Signed)
[facility] [place] [facility]   PATIENT NAME: Ross [name]    MR#:  [id number]  DATE OF BIRTH:  [birth year]  SUBJECTIVE: Patient complains of left calf pain. No other complaints.   CHIEF COMPLAINT:   Chief Complaint  Patient presents with  . Seizures     REVIEW OF SYSTEMS:   Review of Systems  Constitutional: Negative for fever and chills.  HENT: Negative for hearing loss.   Eyes: Negative for blurred vision, double vision and photophobia.  Respiratory: Positive for cough and sputum production. Negative for hemoptysis, shortness of breath and wheezing.   Cardiovascular: Negative for chest pain, palpitations, orthopnea and leg swelling.  Gastrointestinal: Negative for nausea, vomiting, abdominal pain and diarrhea.  Genitourinary: Negative for dysuria and urgency.  Musculoskeletal: Positive for myalgias. Negative for neck pain.  Skin: Negative for rash.  Neurological: Negative for dizziness, focal weakness, seizures, weakness and headaches.  Psychiatric/Behavioral: Negative for memory loss. The patient does not have insomnia.      DRUG ALLERGIES:   Allergies  Allergen Reactions  . Antihistamines, Diphenhydramine-Type Anaphylaxis and Rash  . Benadryl [Diphenhydramine Hcl] Anaphylaxis and Rash  . Hydroxyzine Anaphylaxis and Rash    VITALS:  Blood pressure 129/84, pulse 105, temperature 99 F (37.2 C), temperature source Core (Comment), resp. rate 32, height  (1.803 m), weight 122.5 kg (270 lb 1 oz), SpO2 95 %.  PHYSICAL EXAMINATION:  GENERAL:  49 y.o.-year-old patient weak, sitting up eating lunch with assistance LUNGS: CTAB, good air movement CARDIOVASCULAR: S1, S2 normal. No murmurs, rubs, or gallops.  ABDOMEN: Soft, nontender, nondistended. Bowel sounds present. No organomegaly or mass.  EXTREMITIES: No pedal edema, cyanosis, or clubbing. Peripheral pulses 2+ NEUROLOGIC: alert, CN's grossly intact, moves all extremities,  oriented to person, follows comands SKIN: No obvious rash, lesion, or ulcer.    LABORATORY PANEL:   CBC  Recent Labs Lab 08/28/15 0500  WBC 17.4*  HGB 12.3*  HCT 37.7*  PLT 145*   ------------------------------------------------------------------------------------------------------------------  Chemistries   Recent Labs Lab 08/26/15 1819  08/28/15 0500  NA  --   < > 145  K 3.6  < > 3.6  CL  --   < > 115*  CO2  --   < > 21*  GLUCOSE  --   < > 152*  BUN  --   < > 70*  CREATININE  --   < > 4.72*  CALCIUM  --   < > 8.4*  MG 2.0  --   --   AST  --   < > 474*  ALT  --   < > 270*  ALKPHOS  --   < > 361*  BILITOT  --   < > 1.0  < > = values in this interval not displayed. ------------------------------------------------------------------------------------------------------------------  Cardiac Enzymes  Recent Labs Lab 08/22/15 0240  TROPONINI 1.79*   ------------------------------------------------------------------------------------------------------------------  RADIOLOGY:  No results found.  EKG:   Orders placed or performed during the hospital encounter of 08/21/15  . EKG 12-Lead  . EKG 12-Lead    ASSESSMENT AND PLAN:   #1 fever/sepsis: Likely due to C. difficile colitis - Influenza negative, urine and blood cultures negative, HIV hepatitis panel negative - Clostridium difficile positive on oral vancomycin,fevers going down. - Bronchoscopy today BAL culture NTD, UA on 11/6 positive, started on meropenem and vancomycin empirically but now dc'ed - fever may also be related to ETOH withdrawal   #2 acute respiratory failure with hypoxia and  altered mental status: resolved - Pulmonology following, bronchoscopy 11/8 with BAL, thick secretions in right airway Wbc up due to steroids  #3 rhabdomyolysis: CK trending down - Possibly due to prolonged immobilization, hypotension, diarrhea, possible withdrawal seizures prior to admission. Patient does not  remember time prior to admission. - Continue hydration, HD, CK decreasing slowly Myalgias secondary to rhabdomyolysis.  #4 acute renal failure: Temp HD catheter placed and will have treatments as needed. - Due to ATN, rhabdomyolysis - Appreciate nephrology following - Continue IV fluids and bicarbonate - HD as per nephro  #5 thrombocytopenia: Improving - Possibly due to sepsis in the setting of C. difficile infection - patient also with history of heavy ETOH use. - stable, no bleeding noted  #6 elevated troponin - No concerning EKG changes, no events on telemetry.  - Heparin drip stopped due to thrombocytopenia - 2-D echocardiogram is normal with no focal wall motion abnormalities - Likely related to rhabdo - appreciate cardiology consultation  #7 altered mental status - responding appropriately today  #8 transaminitis: improving,LFTS trending down. - Viral hepatitis panel negative - Some of the AST likely due to rhabdomyolysis - Likely shock liver as well as possible ETOH hepatitis  9.bradycardia;asymptomatic,not  On any rate controlling meds Position  physical therapy is recommending SNF placement  All the records are reviewed and case discussed with Care Management/Social Workerr. Management plans discussed with the patient, family and they are in agreement.  CODE STATUS: Full   TOTAL CRITICAL CARE TIME TAKING CARE OF THIS PATIENT: 35 minutes.  Greater than 50% of time spent in care coordination and counseling. POSSIBLE D/C IN ? DAYS, DEPENDING ON CLINICAL CONDITION.   Katha HammingKONIDENA,Maley Venezia M.D on 08/28/2015 at 1:55 PM  Between 7am to 6pm - Pager - [phone]  After 6pm go to [email or web address] - password [email or web address] [facility]  [facility] [place]  Office  [phone]  CC: Primary care physician; No primary care provider on file.

## 2015-08-28 NOTE — Evaluation (Signed)
Physical Therapy Evaluation Patient Details Name: Ross [name] MRN: [id number] DOB: March 26, 1966 Today's Date: 08/28/2015   History of Present Illness  presented to ER after being found unresponsive and noted to have seizure-like activity, intubated in ED due to respiratory failure with hypoxia; admitted with sepsis (likely related to c-diff) with multi-organ failure.  Noted with mild elevation in troponin, likely secondary to rhabdomyolysis.  Hospital course additionally significant for placement of R temp femoral dialysis catheter for HD; self-extubated on 11/9, now on RA and tolerating well.  Clinical Impression  Upon evaluation, patient alert and oriented to self only; follows simple, one-step commands, but globally confused and notably restless.  Bilat UE/LE strength and ROM grossly WFL for basic mobility; R LE not tested secondary to R temp fem cath placement.  Does report mild pain in L shoulder, but with additional palpation/assessment, appears to be more point tender over L lateral forearm.  Able to complete bed mobility and self-repositioning with min assist (for management of lines/tubes); additional mobility deferred secondary to R temp cath placement.  Will continue to assess mobility as patient medically appropriate (temp cath removed vs. converted to permcath). Would benefit from skilled PT to address above deficits and promote optimal return to PLOF; recommend transition to STR upon discharge from acute hospitalization (pending additional mobility assessment).     Follow Up Recommendations SNF (pending additional mobility assessment)    Equipment Recommendations       Recommendations for Other Services       Precautions / Restrictions Precautions Precautions: Fall Precaution Comments: R temp fem cath; enteric isolation Restrictions Weight Bearing Restrictions: No      Mobility  Bed Mobility Overal bed mobility: Needs Assistance Bed Mobility: Rolling Rolling: Min  assist            Transfers                 General transfer comment: contraindicated due to R temp fem cath  Ambulation/Gait             General Gait Details: contraindicated due to R temp fem cath  Stairs            Wheelchair Mobility    Modified Rankin (Stroke Patients Only)       Balance                                             Pertinent Vitals/Pain Pain Assessment: 0-10 Pain Score: 2  Pain Location: L UE, shoulder and forearm Pain Descriptors / Indicators: Aching Pain Intervention(s): Monitored during session;Repositioned;Limited activity within patient's tolerance    Home Living Family/patient expects to be discharged to:: Private residence Living Arrangements: Alone   Type of Home: Apartment Home Access: Level entry     Home Layout: One level Home Equipment: Cane - single point Additional Comments: patient unreliable historian, will verify with family as available    Prior Function Level of Independence: Independent with assistive device(s)         Comments: Per patient report, mod indep with use of SPC; does endorse intermittent fall history.  Questionable historian.     Hand Dominance        Extremity/Trunk Assessment   Upper Extremity Assessment: Overall WFL for tasks assessed           Lower Extremity Assessment:  (R LE not assessed secondary to R  temp fem cath; L LE grossly WFL, strength at least 4/5)         Communication   Communication: No difficulties  Cognition Arousal/Alertness: Awake/alert Behavior During Therapy: Restless Overall Cognitive Status: Difficult to assess (oriented to self only; follows simple one-step commands.  Generally confused and restless)       Memory: Decreased short-term memory              General Comments      Exercises Other Exercises Other Exercises: L LE AROM, 1x10: ankle pumps, quad sets, heel slides (with resisted extension), hip  abdut/adduct. Other Exercises: Bilat UE AROM, 1x10: shoulder flex/ext, horiz abduct/adduct, bicep flex/ext.  Mild pain reported in L UE, forearm > shoulder      Assessment/Plan    PT Assessment Patient needs continued PT services  PT Diagnosis Difficulty walking;Generalized weakness;Altered mental status   PT Problem List Decreased range of motion;Decreased activity tolerance;Decreased balance;Decreased mobility;Decreased cognition;Decreased knowledge of use of DME;Decreased safety awareness;Decreased knowledge of precautions;Cardiopulmonary status limiting activity;Pain  PT Treatment Interventions Gait training;DME instruction;Stair training;Functional mobility training;Therapeutic activities;Therapeutic exercise;Balance training;Cognitive remediation;Patient/family education   PT Goals (Current goals can be found in the Care Plan section) Acute Rehab PT Goals Patient Stated Goal: to move around a little bit PT Goal Formulation: With patient Time For Goal Achievement: 09/11/15 Potential to Achieve Goals: Fair Additional Goals Additional Goal #1: Assess and establish goals for OOB activity as appropriate.    Frequency Min 2X/week   Barriers to discharge        Co-evaluation               End of Session   Activity Tolerance: Patient tolerated treatment well Patient left: in bed;with call bell/phone within reach;with nursing/sitter in room           Time: 0922-0941 PT Time Calculation (min) (ACUTE ONLY): 19 min   Charges:   PT Evaluation $Initial PT Evaluation Tier I: 1 Procedure PT Treatments $Therapeutic Exercise: 8-22 mins   PT G Codes:       Dekari Bures H. Manson PasseyBrown, PT, DPT, NCS 08/28/2015, 10:01 AM [phone]

## 2015-08-28 NOTE — Progress Notes (Signed)
Speech Therapy Note: reviewed chart notes; consulted NSG re: pt's status today. NSG reported pt was tolerating his oral diet well w/ no swallowing deficits noted. Rec. Pt continue w/ current diet w/ general aspiration precautions and tray setup support as needed. NSG to reconsult ST services if any change in status.

## 2015-08-29 LAB — BASIC METABOLIC PANEL
ANION GAP: 9 (ref 5–15)
BUN: 83 mg/dL — AB (ref 6–20)
CHLORIDE: 120 mmol/L — AB (ref 101–111)
CO2: 20 mmol/L — ABNORMAL LOW (ref 22–32)
Calcium: 8.6 mg/dL — ABNORMAL LOW (ref 8.9–10.3)
Creatinine, Ser: 4.75 mg/dL — ABNORMAL HIGH (ref 0.61–1.24)
GFR, EST AFRICAN AMERICAN: 15 mL/min — AB (ref >60)
GFR, EST NON AFRICAN AMERICAN: 13 mL/min — AB (ref >60)
Glucose, Bld: 121 mg/dL — ABNORMAL HIGH (ref 65–99)
POTASSIUM: 3 mmol/L — AB (ref 3.5–5.1)
SODIUM: 149 mmol/L — AB (ref 135–145)

## 2015-08-29 LAB — GLUCOSE, CAPILLARY
GLUCOSE-CAPILLARY: 189 mg/dL — AB (ref 65–99)
GLUCOSE-CAPILLARY: 110 mg/dL — AB (ref 65–99)
GLUCOSE-CAPILLARY: 101 mg/dL — AB (ref 65–99)
GLUCOSE-CAPILLARY: 146 mg/dL — AB (ref 65–99)
GLUCOSE-CAPILLARY: 143 mg/dL — AB (ref 65–99)
Glucose-Capillary: 122 mg/dL — ABNORMAL HIGH (ref 65–99)
Glucose-Capillary: 174 mg/dL — ABNORMAL HIGH (ref 65–99)
Glucose-Capillary: 132 mg/dL — ABNORMAL HIGH (ref 65–99)

## 2015-08-29 LAB — CK: Total CK: 3614 U/L — ABNORMAL HIGH (ref 49–397)

## 2015-08-29 MED ORDER — TRAZODONE HCL 50 MG PO TABS
25.0000 mg | ORAL_TABLET | Freq: Every evening | ORAL | Status: DC | PRN
  Administered 2015-09-01 – 2015-09-02 (×2): 25 mg via ORAL
  Filled 2015-08-29 (×2): qty 1

## 2015-08-29 MED ORDER — HYDRALAZINE HCL 20 MG/ML IJ SOLN
10.0000 mg | INTRAMUSCULAR | Status: DC | PRN
  Administered 2015-09-08: 10 mg via INTRAVENOUS
  Filled 2015-08-29: qty 1

## 2015-08-29 MED ORDER — KCL IN DEXTROSE-NACL 20-5-0.45 MEQ/L-%-% IV SOLN
INTRAVENOUS | Status: DC
  Administered 2015-08-29 – 2015-08-31 (×4): via INTRAVENOUS
  Filled 2015-08-29 (×6): qty 1000

## 2015-08-29 MED ORDER — OXYCODONE HCL 5 MG PO TABS
5.0000 mg | ORAL_TABLET | Freq: Four times a day (QID) | ORAL | Status: DC | PRN
  Administered 2015-08-29 – 2015-09-01 (×5): 5 mg via ORAL
  Filled 2015-08-29 (×5): qty 1

## 2015-08-29 MED ORDER — POTASSIUM CHLORIDE CRYS ER 20 MEQ PO TBCR: 30.0000 meq | EXTENDED_RELEASE_TABLET | Freq: Once | ORAL | Status: DC

## 2015-08-29 MED ORDER — POTASSIUM CHLORIDE 10 MEQ/100ML IV SOLN
10.0000 meq | INTRAVENOUS | Status: AC
  Administered 2015-08-29 (×3): 10 meq via INTRAVENOUS
  Filled 2015-08-29 (×3): qty 100

## 2015-08-29 NOTE — Progress Notes (Signed)
No distress No complaints Cognition intact  Filed Vitals:   08/29/15 0900 08/29/15 1000 08/29/15 1054 08/29/15 1200  BP:  145/132 143/92 137/80  Pulse: 73 149 89   Temp: 99.1 F (37.3 C) 99.1 F (37.3 C)    TempSrc:      Resp: 20 21 20 21   Height:      Weight:      SpO2: 100% 96% 100%    NAD HEENT WNL Chest clear RRR s M NABS, soft, NT Ext warm without edema Neuro without focal deficits  BMP Latest Ref Rng 08/29/2015 08/28/2015 08/27/2015  Glucose 65 - 99 mg/dL 478(G121(H) 956(O152(H) 130(Q133(H)  BUN 6 - 20 mg/dL 65(H83(H) 84(O70(H) 96(E52(H)  Creatinine 0.61 - 1.24 mg/dL 9.52(W4.75(H) 4.13(K4.72(H) 4.40(N4.52(H)  Sodium 135 - 145 mmol/L 149(H) 145 145  Potassium 3.5 - 5.1 mmol/L 3.0(L) 3.6 3.4(L)  Chloride 101 - 111 mmol/L 120(H) 115(H) 112(H)  CO2 22 - 32 mmol/L 20(L) 21(L) 23  Calcium 8.9 - 10.3 mg/dL 0.2(V8.6(L) 2.5(D8.4(L) 8.3(L)    CBC Latest Ref Rng 08/28/2015 08/28/2015 08/27/2015  WBC 3.8 - 10.6 K/uL 21.5(H) 17.4(H) 15.8(H)  Hemoglobin 13.0 - 18.0 g/dL 66.413.1 12.3(L) 12.2(L)  Hematocrit 40.0 - 52.0 % 38.9(L) 37.7(L) 37.5(L)  Platelets 150 - 440 K/uL 167 145(L) 120(L)    No new CXR  IMPRESSION: Acute respiratory failure, resolved AKI, nonoliguric Rhabdomyolysis HypoK HyperNa C diff  PLAN/REC: IVFs adjusted Trf to med-surg Cont PO vanc CVL removal if PIV can be established  PCCM will sign off. Please call if we can be of further assistance  Billy Fischeravid Neena Beecham, MD PCCM service Mobile [phone] Pager [phone]

## 2015-08-29 NOTE — Progress Notes (Signed)
Spoke with Dr. Luberta MutterKonidena in regards to PO potassium order and let her know that pt has I.V. Potassium run ordered. Dr. Luberta MutterKonidena ordered for PO potasium to be discontinued. Taneika Choi E 2:06 PM 08/29/2015

## 2015-08-29 NOTE — Progress Notes (Signed)
Subjective:  Patient still appears confused. Creatinine still high at 4.75. However good urine output noted of 1.5 L over the past 24 hours. Hypernatremia noted at this time as well with a sodium of 149.   Objective:  Vital signs in last 24 hours:  Temp:  [97.2 F (36.2 C)-99.5 F (37.5 C)] 99.1 F (37.3 C) (11/12 1000) Pulse Rate:  [69-149] 89 (11/12 1054) Resp:  [17-34] 20 (11/12 1054) BP: (97-170)/(70-132) 143/92 mmHg (11/12 1054) SpO2:  [67 %-100 %] 100 % (11/12 1054) Weight:  [120.4 kg (265 lb 6.9 oz)] 120.4 kg (265 lb 6.9 oz) (11/12 0456)  Weight change: -2.1 kg (-4 lb 10.1 oz) Filed Weights   08/26/15 1400 08/28/15 0520 08/29/15 0456  Weight: 114.9 kg (253 lb 4.9 oz) 122.5 kg (270 lb 1 oz) 120.4 kg (265 lb 6.9 oz)    Intake/Output:    Intake/Output Summary (Last 24 hours) at 08/29/15 1156 Last data filed at 08/29/15 1100  Gross per 24 hour  Intake   2280 ml  Output   2278 ml  Net      2 ml     Physical Exam: General:  laying in bed  HEENT  [place]/ET EOMI hearing intact OM dry  Neck  supple  Pulm/lungs  CTAB normal effort  CVS/Heart  S1S2 no rubs  Abdomen:   soft, +bowel sounds, nontender  Extremities:  trace b/l LE edema  Neurologic:  awake, alert, confused, follows simple commands  Skin:  no acute rashes  Access: Right femoral non tunneled catheter Dr. Belia HemanKasa 11/6       Basic Metabolic Panel:   Recent Labs Lab 08/23/15 1346 08/24/15 0943 08/25/15 0503 08/26/15 0515 08/26/15 1819 08/27/15 0452 08/28/15 0500 08/29/15 0530  NA 144 143 143 142  --  145 145 149*  K 4.2 3.7 3.4* 3.1* 3.6 3.4* 3.6 3.0*  CL 107 110 106 107  --  112* 115* 120*  CO2 24 24 28 28   --  23 21* 20*  GLUCOSE 97 95 95 132*  --  133* 152* 121*  BUN 45* 45* 38* 36*  --  52* 70* 83*  CREATININE 4.15* 4.68* 4.18* 3.89*  --  4.52* 4.72* 4.75*  CALCIUM 6.5* 6.6* 7.5* 8.1*  --  8.3* 8.4* 8.6*  MG  --   --   --   --  2.0  --   --   --   PHOS 4.7* 3.3 3.7 2.9  --  2.8  --   --       CBC:  Recent Labs Lab 08/23/15 0139  08/25/15 0459 08/26/15 0515 08/27/15 0452 08/28/15 0500 08/28/15 1632  WBC 5.7  < > 8.5 10.2 15.8* 17.4* 21.5*  NEUTROABS 4.9  --   --   --   --   --   --   HGB 13.6  < > 11.4* 11.4* 12.2* 12.3* 13.1  HCT 41.8  < > 34.6* 34.8* 37.5* 37.7* 38.9*  MCV 91.7  < > 90.8 89.7 88.7 88.5 87.7  PLT 51*  < > 84* 97* 120* 145* 167  < > = values in this interval not displayed.    Microbiology:  Recent Results (from the past 720 hour(s))  Blood Culture (routine x 2)     Status: None (Preliminary result)   Collection Time: 08/21/15 12:47 PM  Result Value Ref Range Status   Specimen Description BLOOD UNKNOWN  Final   Special Requests BOTTLES DRAWN AEROBIC AND ANAEROBIC  1CC  Final   Culture NO GROWTH 4 DAYS  Final   Report Status PENDING  Incomplete  Blood Culture (routine x 2)     Status: None (Preliminary result)   Collection Time: 08/21/15 12:47 PM  Result Value Ref Range Status   Specimen Description BLOOD LEFT AC  Final   Special Requests BOTTLES DRAWN AEROBIC AND ANAEROBIC  6CC  Final   Culture NO GROWTH 4 DAYS  Final   Report Status PENDING  Incomplete  Urine culture     Status: None   Collection Time: 08/21/15 12:47 PM  Result Value Ref Range Status   Specimen Description URINE, CATHETERIZED  Final   Special Requests Normal  Final   Culture NO GROWTH 2 DAYS  Final   Report Status 08/23/2015 FINAL  Final  MRSA PCR Screening     Status: None   Collection Time: 08/21/15 11:47 PM  Result Value Ref Range Status   MRSA by PCR NEGATIVE NEGATIVE Final    Comment:        The GeneXpert MRSA Assay (FDA approved for NASAL specimens only), is one component of a comprehensive MRSA colonization surveillance program. It is not intended to diagnose MRSA infection nor to guide or monitor treatment for MRSA infections.   C difficile quick scan w PCR reflex     Status: Abnormal   Collection Time: 08/22/15  2:31 PM  Result Value Ref Range  Status   C Diff antigen POSITIVE (A) NEGATIVE Final   C Diff toxin POSITIVE (A) NEGATIVE Final   C Diff interpretation   Final    Positive for toxigenic C. difficile, active toxin production present.    Comment: CRITICAL RESULT CALLED TO, READ BACK BY AND VERIFIED WITH: CHARLIE FLEETWOOD,RN 08/22/2015 1544 BY J RAZZAKSUAREZ,MT   Culture, bal-quantitative     Status: None   Collection Time: 08/25/15  3:15 PM  Result Value Ref Range Status   Specimen Description BRONCHIAL ALVEOLAR LAVAGE  Final   Special Requests NONE  Final   Gram Stain   Final    GOOD SPECIMEN - 80-90% WBCS FEW WBC SEEN NO ORGANISMS SEEN    Culture HOLDING FOR POSSIBLE PATHOGEN  Final   Report Status 08/27/2015 FINAL  Final    Coagulation Studies: No results for input(s): LABPROT, INR in the last 72 hours.  Urinalysis: No results for input(s): COLORURINE, LABSPEC, PHURINE, GLUCOSEU, HGBUR, BILIRUBINUR, KETONESUR, PROTEINUR, UROBILINOGEN, NITRITE, LEUKOCYTESUR in the last 72 hours.  Invalid input(s): APPERANCEUR    Imaging: No results found.   Medications:   . sodium chloride 150 mL/hr at 08/29/15 0600  . dextrose 5 % and 0.45 % NaCl with KCl 20 mEq/L 100 mL/hr at 08/29/15 1058   . feeding supplement (ENSURE ENLIVE)  237 mL Oral BID WC  . insulin aspart  0-5 Units Subcutaneous QHS  . insulin aspart  0-9 Units Subcutaneous TID WC  . vancomycin  125 mg Oral 4 times per day   acetaminophen **OR** acetaminophen, hydrALAZINE, Influenza vac split quadrivalent PF, ipratropium-albuterol, ondansetron (ZOFRAN) IV, pneumococcal 23 valent vaccine  Assessment/ Plan:  49 y.o. male With alcohol abuse, schizophrenia, seizure disorder, coronary disease/MI 2012 was admitted to [facility] on November 4 after being found unresponsive in his apartment. Urine toxicology screen is positive for benzodiazepines and cannabinoids. Stool positive for C. Difficile  1. Acute renal failure, likely secondary to ATN from concurrent illness,  volume depletion, C. difficile, rhabdomyolysis and sepsis. Has had 3 HD treatments. -Good urine output noted at the  moment. Therefore no urgent indication for dialysis at present. We will continue to monitor serum electrolytes.  2. Acute rhabdomyolysis M62.82: No new CK today. CK was 5313 yesterday. Continue IV fluid hydration however we will change IV fluids to half-normal saline.  3.  Hypernatremia. Serum sodium 149. DC normal saline and switch over to half-normal saline at 150 cc per hour.  4.  Hypokalemia. Patient receiving some supplementation with IV fluids. We will provide another 30 mEq of potassium chloride today.  5. Infective Colitis A09: c. Diff positive.  - PO vancomycin     LOS: 8 Karsten Howry 11/12/201611:56 AM

## 2015-08-29 NOTE — Progress Notes (Signed)
[facility] - [place] at [facility]   PATIENT NAME: Ross [name]    MR#:  [id number]  DATE OF BIRTH:  07-Apr-1966  SUBJECTIVE: Patient complains of left calf pain. Tolerating the diet. Had one loose stool today.   CHIEF COMPLAINT:   Chief Complaint  Patient presents with  . Seizures     REVIEW OF SYSTEMS:   Review of Systems  Constitutional: Negative for fever and chills.  HENT: Negative for hearing loss.   Eyes: Negative for blurred vision, double vision and photophobia.  Respiratory: Negative for cough, hemoptysis, sputum production, shortness of breath and wheezing.   Cardiovascular: Negative for chest pain, palpitations, orthopnea and leg swelling.  Gastrointestinal: Negative for nausea, vomiting, abdominal pain and diarrhea.  Genitourinary: Negative for dysuria and urgency.  Musculoskeletal: Positive for myalgias. Negative for neck pain.  Skin: Negative for rash.  Neurological: Negative for dizziness, focal weakness, seizures, weakness and headaches.  Psychiatric/Behavioral: Negative for memory loss. The patient does not have insomnia.      DRUG ALLERGIES:   Allergies  Allergen Reactions  . Antihistamines, Diphenhydramine-Type Anaphylaxis and Rash  . Benadryl [Diphenhydramine Hcl] Anaphylaxis and Rash  . Hydroxyzine Anaphylaxis and Rash    VITALS:  Blood pressure 137/80, pulse 89, temperature 99.1 F (37.3 C), temperature source Core (Comment), resp. rate 21, height  (1.803 m), weight 120.4 kg (265 lb 6.9 oz), SpO2 100 %.  PHYSICAL EXAMINATION:  GENERAL:  49 y.o.-year-old patient weak, sitting up eating lunch with assistance LUNGS: CTAB, good air movement CARDIOVASCULAR: S1, S2 normal. No murmurs, rubs, or gallops.  ABDOMEN: Soft, nontender, nondistended. Bowel sounds present. No organomegaly or mass.  EXTREMITIES: No pedal edema, cyanosis, or clubbing. Peripheral pulses 2+ NEUROLOGIC: alert, CN's grossly intact, moves all  extremities, oriented to person, follows comands SKIN: No obvious rash, lesion, or ulcer.    LABORATORY PANEL:   CBC  Recent Labs Lab 08/28/15 1632  WBC 21.5*  HGB 13.1  HCT 38.9*  PLT 167   ------------------------------------------------------------------------------------------------------------------  Chemistries   Recent Labs Lab 08/26/15 1819  08/28/15 0500 08/29/15 0530  NA  --   < > 145 149*  K 3.6  < > 3.6 3.0*  CL  --   < > 115* 120*  CO2  --   < > 21* 20*  GLUCOSE  --   < > 152* 121*  BUN  --   < > 70* 83*  CREATININE  --   < > 4.72* 4.75*  CALCIUM  --   < > 8.4* 8.6*  MG 2.0  --   --   --   AST  --   < > 474*  --   ALT  --   < > 270*  --   ALKPHOS  --   < > 361*  --   BILITOT  --   < > 1.0  --   < > = values in this interval not displayed. ------------------------------------------------------------------------------------------------------------------  Cardiac Enzymes No results for input(s): TROPONINI in the last 168 hours. ------------------------------------------------------------------------------------------------------------------  RADIOLOGY:  No results found.  EKG:   Orders placed or performed during the hospital encounter of 08/21/15  . EKG 12-Lead  . EKG 12-Lead    ASSESSMENT AND PLAN:   #1 fever/sepsis: Likely due to C. difficile colitis - Influenza negative, urine and blood cultures negative, HIV hepatitis panel negative - Clostridium difficile positive on oral vancomycin,fevers going down. - Bronchoscopy today BAL culture NTD, UA on 11/6 positive, started  on meropenem and vancomycin empirically but now dc'ed - fever may also be related to ETOH withdrawal  Continue by mouth vancomycin.  #2 acute respiratory failure with hypoxia and altered mental status: resolved - Pulmonology following, bronchoscopy 11/8 with BAL, thick secretions in right airway Wbc up due to steroids  #3 rhabdomyolysis: CK trending down - Possibly due  to prolonged immobilization, hypotension, diarrhea, possible withdrawal seizures prior to admission. Patient does not remember time prior to admission. - Continue hydration, , CK decreasing slowly Myalgias secondary to rhabdomyolysis.  #4 acute renal failure: Temp HD catheter placed and will have treatments as needed. - Due to ATN, rhabdomyolysis - Appreciate nephrology following - Continue IV fluids and bicarbonate - HD as per nephro received 3 sessions of hemodialysis  Till now.   #5 thrombocytopenia: Improving - Possibly due to sepsis in the setting of C. difficile infection - patient also with history of heavy ETOH use. - stable, no bleeding noted  #6 elevated troponin - No concerning EKG changes, no events on telemetry.  - Heparin drip stopped due to thrombocytopenia - 2-D echocardiogram is normal with no focal wall motion abnormalities - Likely related to rhabdo - appreciate cardiology consultation  #7 altered mental status - responding appropriately today  #8 transaminitis: improving,LFTS trending down. - Viral hepatitis panel negative - Some of the AST likely due to rhabdomyolysis - Likely shock liver as well as possible ETOH hepatitis  9.bradycardia;asymptomatic, resolved.  physical therapy is recommending SNF placement  All the records are reviewed and case discussed with Care Management/Social Workerr. Management plans discussed with the patient, family and they are in agreement.  CODE STATUS: Full   TOTAL CRITICAL CARE TIME TAKING CARE OF THIS PATIENT: 35 minutes.  Greater than 50% of time spent in care coordination and counseling. POSSIBLE D/C IN ? DAYS, DEPENDING ON CLINICAL CONDITION.   Katha HammingKONIDENA,Yesennia Hirota M.D on 08/29/2015 at 1:26 PM  Between 7am to 6pm - Pager - [phone]  After 6pm go to [email or web address] - password [email or web address] [facility]  [facility]  Office  [phone]  CC: Primary care physician; No primary care provider on file.

## 2015-08-30 LAB — GLUCOSE, CAPILLARY
GLUCOSE-CAPILLARY: 99 mg/dL (ref 65–99)
GLUCOSE-CAPILLARY: 91 mg/dL (ref 65–99)
Glucose-Capillary: 133 mg/dL — ABNORMAL HIGH (ref 65–99)
Glucose-Capillary: 116 mg/dL — ABNORMAL HIGH (ref 65–99)

## 2015-08-30 LAB — BASIC METABOLIC PANEL
ANION GAP: 7 (ref 5–15)
BUN: 86 mg/dL — ABNORMAL HIGH (ref 6–20)
CHLORIDE: 119 mmol/L — AB (ref 101–111)
CO2: 18 mmol/L — ABNORMAL LOW (ref 22–32)
Calcium: 9.1 mg/dL (ref 8.9–10.3)
Creatinine, Ser: 4.93 mg/dL — ABNORMAL HIGH (ref 0.61–1.24)
GFR calc Af Amer: 15 mL/min — ABNORMAL LOW (ref >60)
GFR, EST NON AFRICAN AMERICAN: 13 mL/min — AB (ref >60)
GLUCOSE: 119 mg/dL — AB (ref 65–99)
POTASSIUM: 3.2 mmol/L — AB (ref 3.5–5.1)
Sodium: 144 mmol/L (ref 135–145)

## 2015-08-30 MED ORDER — POTASSIUM CHLORIDE 10 MEQ/100ML IV SOLN
10.0000 meq | INTRAVENOUS | Status: AC
  Administered 2015-08-30 (×4): 10 meq via INTRAVENOUS
  Filled 2015-08-30 (×5): qty 100

## 2015-08-30 NOTE — Progress Notes (Signed)
Subjective:  Pt in better spirits today. Na down to 144 this AM.  Cr still high at 4.9.   Objective:  Vital signs in last 24 hours:  Temp:  [97.8 F (36.6 C)-99 F (37.2 C)] 97.8 F (36.6 C) (11/13 0620) Pulse Rate:  [64-85] 85 (11/13 1152) Resp:  [20-24] 20 (11/13 1152) BP: (143-152)/(69-83) 148/69 mmHg (11/13 1152) SpO2:  [100 %] 100 % (11/13 1152) Weight:  [123.152 kg (271 lb 8 oz)] 123.152 kg (271 lb 8 oz) (11/13 0439)  Weight change: 2.752 kg (6 lb 1.1 oz) Filed Weights   08/28/15 0520 08/29/15 0456 08/30/15 0439  Weight: 122.5 kg (270 lb 1 oz) 120.4 kg (265 lb 6.9 oz) 123.152 kg (271 lb 8 oz)    Intake/Output:    Intake/Output Summary (Last 24 hours) at 08/30/15 1200 Last data filed at 08/30/15 0700  Gross per 24 hour  Intake 1585.33 ml  Output      0 ml  Net 1585.33 ml     Physical Exam: General:  laying in bed  HEENT  [place]/ET EOMI hearing intact OM dry  Neck  supple  Pulm/lungs  CTAB normal effort  CVS/Heart  S1S2 no rubs  Abdomen:   soft, +bowel sounds, nontender  Extremities:  trace b/l LE edema  Neurologic:  awake, alert, confused, follows simple commands  Skin:  no acute rashes  Access: Right femoral non tunneled catheter Dr. Belia HemanKasa 11/6       Basic Metabolic Panel:   Recent Labs Lab 08/23/15 1346 08/24/15 0943 08/25/15 0503 08/26/15 0515 08/26/15 1819 08/27/15 0452 08/28/15 0500 08/29/15 0530 08/30/15 0643  NA 144 143 143 142  --  145 145 149* 144  K 4.2 3.7 3.4* 3.1* 3.6 3.4* 3.6 3.0* 3.2*  CL 107 110 106 107  --  112* 115* 120* 119*  CO2 24 24 28 28   --  23 21* 20* 18*  GLUCOSE 97 95 95 132*  --  133* 152* 121* 119*  BUN 45* 45* 38* 36*  --  52* 70* 83* 86*  CREATININE 4.15* 4.68* 4.18* 3.89*  --  4.52* 4.72* 4.75* 4.93*  CALCIUM 6.5* 6.6* 7.5* 8.1*  --  8.3* 8.4* 8.6* 9.1  MG  --   --   --   --  2.0  --   --   --   --   PHOS 4.7* 3.3 3.7 2.9  --  2.8  --   --   --      CBC:  Recent Labs Lab 08/25/15 0459 08/26/15 0515  08/27/15 0452 08/28/15 0500 08/28/15 1632  WBC 8.5 10.2 15.8* 17.4* 21.5*  HGB 11.4* 11.4* 12.2* 12.3* 13.1  HCT 34.6* 34.8* 37.5* 37.7* 38.9*  MCV 90.8 89.7 88.7 88.5 87.7  PLT 84* 97* 120* 145* 167      Microbiology:  Recent Results (from the past 720 hour(s))  Blood Culture (routine x 2)     Status: None (Preliminary result)   Collection Time: 08/21/15 12:47 PM  Result Value Ref Range Status   Specimen Description BLOOD UNKNOWN  Final   Special Requests BOTTLES DRAWN AEROBIC AND ANAEROBIC  1CC  Final   Culture NO GROWTH 4 DAYS  Final   Report Status PENDING  Incomplete  Blood Culture (routine x 2)     Status: None (Preliminary result)   Collection Time: 08/21/15 12:47 PM  Result Value Ref Range Status   Spec[facility]  Final   Special Requests BOTTLES  DRAWN AEROBIC AND ANAEROBIC  6CC  Final   Culture NO GROWTH 4 DAYS  Final   Report Status PENDING  Incomplete  Urine culture     Status: None   Collection Time: 08/21/15 12:47 PM  Result Value Ref Range Status   Specimen Description URINE, CATHETERIZED  Final   Special Requests Normal  Final   Culture NO GROWTH 2 DAYS  Final   Report Status 08/23/2015 FINAL  Final  MRSA PCR Screening     Status: None   Collection Time: 08/21/15 11:47 PM  Result Value Ref Range Status   MRSA by PCR NEGATIVE NEGATIVE Final    Comment:        The GeneXpert MRSA Assay (FDA approved for NASAL specimens only), is one component of a comprehensive MRSA colonization surveillance program. It is not intended to diagnose MRSA infection nor to guide or monitor treatment for MRSA infections.   C difficile quick scan w PCR reflex     Status: Abnormal   Collection Time: 08/22/15  2:31 PM  Result Value Ref Range Status   C Diff antigen POSITIVE (A) NEGATIVE Final   C Diff toxin POSITIVE (A) NEGATIVE Final   C Diff interpretation   Final    Positive for toxigenic C. difficile, active toxin production present.    Comment:  CRITICAL RESULT CALLED TO, READ BACK BY AND VERIFIED WITH: CHARLIE FLEETWOOD,RN 08/22/2015 1544 BY J RAZZAKSUAREZ,MT   Culture, bal-quantitative     Status: None   Collection Time: 08/25/15  3:15 PM  Result Value Ref Range Status   Specimen Description BRONCHIAL ALVEOLAR LAVAGE  Final   Special Requests NONE  Final   Gram Stain   Final    GOOD SPECIMEN - 80-90% WBCS FEW WBC SEEN NO ORGANISMS SEEN    Culture HOLDING FOR POSSIBLE PATHOGEN  Final   Report Status 08/27/2015 FINAL  Final    Coagulation Studies: No results for input(s): LABPROT, INR in the last 72 hours.  Urinalysis: No results for input(s): COLORURINE, LABSPEC, PHURINE, GLUCOSEU, HGBUR, BILIRUBINUR, KETONESUR, PROTEINUR, UROBILINOGEN, NITRITE, LEUKOCYTESUR in the last 72 hours.  Invalid input(s): APPERANCEUR    Imaging: No results found.   Medications:   . dextrose 5 % and 0.45 % NaCl with KCl 20 mEq/L 100 mL/hr at 08/30/15 0917   . feeding supplement (ENSURE ENLIVE)  237 mL Oral BID WC  . insulin aspart  0-5 Units Subcutaneous QHS  . insulin aspart  0-9 Units Subcutaneous TID WC  . vancomycin  125 mg Oral 4 times per day   acetaminophen **OR** acetaminophen, hydrALAZINE, Influenza vac split quadrivalent PF, ipratropium-albuterol, ondansetron (ZOFRAN) IV, oxyCODONE, pneumococcal 23 valent vaccine, traZODone  Assessment/ Plan:  49 y.o. male With alcohol abuse, schizophrenia, seizure disorder, coronary disease/MI 2012 was admitted to [facility] on November 4 after being found unresponsive in his apartment. Urine toxicology screen is positive for benzodiazepines and cannabinoids. Stool positive for C. Difficile  1. Acute renal failure, likely secondary to ATN from concurrent illness, volume depletion, C. difficile, rhabdomyolysis and sepsis. Has had 3 HD treatments. -Cr remains high at 4.9, good UOP noted however, no urgent indication for HD, will consider this if BUN/Cr start rising however.    2. Acute  rhabdomyolysis M62.82: CK down to 3614, would continue to follow.  3.  Hypernatremia. Na down to 144, continue 1/2 NS.   4.  Hypokalemia. Will give KCL IV again today.  5. Infective Colitis A09: c. Diff positive.  - PO  vancomycin     LOS: 9 Olumide Dolinger 11/13/201612:00 PM

## 2015-08-30 NOTE — Progress Notes (Signed)
[facility] [place] at [facility]   PATIENT NAME: Ross [name]    MR#:  [id number]  DATE OF BIRTH:  [birth year]  SUBJECTIVE: denies any complaints.ck down to 3814.output improving to 2.3 L yesterday.  CHIEF COMPLAINT:   Chief Complaint  Patient presents with  . Seizures     REVIEW OF SYSTEMS:   Review of Systems  Constitutional: Negative for fever and chills.  HENT: Negative for hearing loss.   Eyes: Negative for blurred vision, double vision and photophobia.  Respiratory: Negative for cough, hemoptysis, sputum production, shortness of breath and wheezing.   Cardiovascular: Negative for chest pain, palpitations, orthopnea and leg swelling.  Gastrointestinal: Negative for nausea, vomiting, abdominal pain and diarrhea.  Genitourinary: Negative for dysuria and urgency.  Musculoskeletal: Positive for myalgias. Negative for neck pain.  Skin: Negative for rash.  Neurological: Negative for dizziness, focal weakness, seizures, weakness and headaches.  Psychiatric/Behavioral: Negative for memory loss. The patient does not have insomnia.      DRUG ALLERGIES:   Allergies  Allergen Reactions  . Antihistamines, Diphenhydramine-Type Anaphylaxis and Rash  . Benadryl [Diphenhydramine Hcl] Anaphylaxis and Rash  . Hydroxyzine Anaphylaxis and Rash    VITALS:  Blood pressure 148/69, pulse 85, temperature 97.8 F (36.6 C), temperature source Oral, resp. rate 20, height 5\' 11"  (1.803 m), weight 123.152 kg (271 lb 8 oz), SpO2 100 %.  PHYSICAL EXAMINATION:  GENERAL:  49 y.o.-year-old patient weak, sitting up eating lunch with assistance LUNGS: CTAB, good air movement CARDIOVASCULAR: S1, S2 normal. No murmurs, rubs, or gallops.  ABDOMEN: Soft, nontender, nondistended. Bowel sounds present. No organomegaly or mass.  EXTREMITIES: No pedal edema, cyanosis, or clubbing. Peripheral pulses 2+ NEUROLOGIC: alert, CN's grossly intact, moves all extremities, oriented to  person, follows comands SKIN: No obvious rash, lesion, or ulcer.    LABORATORY PANEL:   CBC  Recent Labs Lab 08/28/15 1632  WBC 21.5*  HGB 13.1  HCT 38.9*  PLT 167   ------------------------------------------------------------------------------------------------------------------  Chemistries   Recent Labs Lab 08/26/15 1819  08/28/15 0500  08/30/15 0643  NA  --   < > 145  < > 144  K 3.6  < > 3.6  < > 3.2*  CL  --   < > 115*  < > 119*  CO2  --   < > 21*  < > 18*  GLUCOSE  --   < > 152*  < > 119*  BUN  --   < > 70*  < > 86*  CREATININE  --   < > 4.72*  < > 4.93*  CALCIUM  --   < > 8.4*  < > 9.1  MG 2.0  --   --   --   --   AST  --   < > 474*  --   --   ALT  --   < > 270*  --   --   ALKPHOS  --   < > 361*  --   --   BILITOT  --   < > 1.0  --   --   < > = values in this interval not displayed. ------------------------------------------------------------------------------------------------------------------  Cardiac Enzymes No results for input(s): TROPONINI in the last 168 hours. ------------------------------------------------------------------------------------------------------------------  RADIOLOGY:  No results found.  EKG:   Orders placed or performed during the hospital encounter of 08/21/15  . EKG 12-Lead  . EKG 12-Lead    ASSESSMENT AND PLAN:   #1 fever/sepsis: Likely due to  C. difficile colitis - Influenza negative, urine and blood cultures negative, HIV hepatitis panel negative - Clostridium difficile positive on oral vancomycin,fevers going down. - Bronchoscopy today BAL culture NTD, UA on 11/6 positive, started on meropenem and vancomycin empirically but now dc'ed - fever may also be related to ETOH withdrawal  Continue by mouth vancomycin.  #2 acute respiratory failure with hypoxia and altered mental status: resolved - Pulmonology following, bronchoscopy 11/8 with BAL, thick secretions in right airway Wbc up due to steroids  #3  rhabdomyolysis: CK trending down - Possibly due to prolonged immobilization, hypotension, diarrhea, possible withdrawal seizures prior to admission. Patient does not remember time prior to admission. - Continue hydration, , CK decreasing slowly Myalgias secondary to rhabdomyolysis.  #4 acute renal failure: secondary to ATN from one depletion, rhabdomyolysis, C. Difficile. Patient renal function is worsening today but urine output is good. No urgent indication for hemodialysis.  #5 thrombocytopenia: Improving - Possibly due to sepsis in the setting of C. difficile infection - patient also with history of heavy ETOH use. - stable, no bleeding noted  #6 elevated troponin - No concerning EKG changes, no events on telemetry.  - Heparin drip stopped due to thrombocytopenia - 2-D echocardiogram is normal with no focal wall motion abnormalities - Likely related to rhabdo - appreciate cardiology consultation  #7 altered mental status - responding appropriately today  #8 transaminitis: improving,LFTS trending down. - Viral hepatitis panel negative - Some of the AST likely due to rhabdomyolysis - Likely shock liver as well as possible ETOH hepatitis  9.bradycardia;asymptomatic, resolved.  physical therapy is recommending SNF placement  All the records are reviewed and case discussed with Care Management/Social Workerr. Management plans discussed with the patient, family and they are in agreement.  CODE STATUS: Full   TOTAL CRITICAL CARE TIME TAKING CARE OF THIS PATIENT: 35 minutes.   POSSIBLE D/C  3-4DAYS, DEPENDING ON CLINICAL CONDITION.   Katha Hamming M.D on 08/30/2015 at 1:41 PM  Between 7am to 6pm - Pager - [phone]  After 6pm go to [email or web address] - password [email or web address] [facility]  [place] [place] [facility]  Office  [phone]  CC: Primary care physician; No primary care provider on file.

## 2015-08-31 ENCOUNTER — Inpatient Hospital Stay: Payer: Medicare Other

## 2015-08-31 LAB — BASIC METABOLIC PANEL
Anion gap: 5 (ref 5–15)
BUN: 83 mg/dL — AB (ref 6–20)
CALCIUM: 8.5 mg/dL — AB (ref 8.9–10.3)
CO2: 21 mmol/L — ABNORMAL LOW (ref 22–32)
Chloride: 120 mmol/L — ABNORMAL HIGH (ref 101–111)
Creatinine, Ser: 4.85 mg/dL — ABNORMAL HIGH (ref 0.61–1.24)
GFR calc Af Amer: 15 mL/min — ABNORMAL LOW (ref >60)
GFR, EST NON AFRICAN AMERICAN: 13 mL/min — AB (ref >60)
GLUCOSE: 109 mg/dL — AB (ref 65–99)
Potassium: 3.8 mmol/L (ref 3.5–5.1)
Sodium: 146 mmol/L — ABNORMAL HIGH (ref 135–145)

## 2015-08-31 LAB — CULTURE, BLOOD (ROUTINE X 2)
CULTURE: NO GROWTH
Culture: NO GROWTH

## 2015-08-31 LAB — GLUCOSE, CAPILLARY
GLUCOSE-CAPILLARY: 112 mg/dL — AB (ref 65–99)
Glucose-Capillary: 104 mg/dL — ABNORMAL HIGH (ref 65–99)
Glucose-Capillary: 84 mg/dL (ref 65–99)

## 2015-08-31 LAB — CK: Total CK: 1414 U/L — ABNORMAL HIGH (ref 49–397)

## 2015-08-31 MED ORDER — SODIUM CHLORIDE 0.45 % IV SOLN
INTRAVENOUS | Status: DC
Start: 2015-08-31 — End: 2015-09-01
  Administered 2015-08-31 (×2): via INTRAVENOUS

## 2015-08-31 NOTE — Progress Notes (Signed)
[facility] [place] [facility]   PATIENT NAME: Ross [name]    MR#:  [id number]  DATE OF BIRTH:  05-28-1966  SUBJECTIVE: family at bedside.pt denies any complaints.renal function is worsening.  CHIEF COMPLAINT:   Chief Complaint  Patient presents with  . Seizures     REVIEW OF SYSTEMS:   Review of Systems  Constitutional: Negative for fever and chills.  HENT: Negative for hearing loss.   Eyes: Negative for blurred vision, double vision and photophobia.  Respiratory: Negative for cough, hemoptysis, sputum production, shortness of breath and wheezing.   Cardiovascular: Negative for chest pain, palpitations, orthopnea and leg swelling.  Gastrointestinal: Negative for nausea, vomiting, abdominal pain and diarrhea.  Genitourinary: Negative for dysuria and urgency.  Musculoskeletal: Positive for myalgias. Negative for neck pain.  Skin: Negative for rash.  Neurological: Negative for dizziness, focal weakness, seizures, weakness and headaches.  Psychiatric/Behavioral: Negative for memory loss. The patient does not have insomnia.      DRUG ALLERGIES:   Allergies  Allergen Reactions  . Antihistamines, Diphenhydramine-Type Anaphylaxis and Rash  . Benadryl [Diphenhydramine Hcl] Anaphylaxis and Rash  . Hydroxyzine Anaphylaxis and Rash    VITALS:  Blood pressure 158/86, pulse 81, temperature 98 F (36.7 C), temperature source Oral, resp. rate 18, height 5\' 11"  (1.803 m), weight 124.376 kg (274 lb 3.2 oz), SpO2 93 %.  PHYSICAL EXAMINATION:  GENERAL:  49 y.o.-year-old patient weak, sitting up eating lunch with assistance LUNGS: CTAB, good air movement CARDIOVASCULAR: S1, S2 normal. No murmurs, rubs, or gallops.  ABDOMEN: Soft, nontender, nondistended. Bowel sounds present. No organomegaly or mass.  EXTREMITIES: left leg edema. cyanosis, or clubbing. Peripheral pulses 2+ NEUROLOGIC: alert, CN's grossly intact, moves all extremities, oriented to  person, follows comands SKIN: No obvious rash, lesion, or ulcer.    LABORATORY PANEL:   CBC  Recent Labs Lab 08/28/15 1632  WBC 21.5*  HGB 13.1  HCT 38.9*  PLT 167   ------------------------------------------------------------------------------------------------------------------  Chemistries   Recent Labs Lab 08/26/15 1819  08/28/15 0500  08/31/15 0622  NA  --   < > 145  < > 146*  K 3.6  < > 3.6  < > 3.8  CL  --   < > 115*  < > 120*  CO2  --   < > 21*  < > 21*  GLUCOSE  --   < > 152*  < > 109*  BUN  --   < > 70*  < > 83*  CREATININE  --   < > 4.72*  < > 4.85*  CALCIUM  --   < > 8.4*  < > 8.5*  MG 2.0  --   --   --   --   AST  --   < > 474*  --   --   ALT  --   < > 270*  --   --   ALKPHOS  --   < > 361*  --   --   BILITOT  --   < > 1.0  --   --   < > = values in this interval not displayed. ------------------------------------------------------------------------------------------------------------------  Cardiac Enzymes No results for input(s): TROPONINI in the last 168 hours. ------------------------------------------------------------------------------------------------------------------  RADIOLOGY:  [place] Venous Img Lower Unilateral Left  08/31/2015  CLINICAL DATA:  Recent history of fall, now with left lower extremity pain and edema for the past 3 weeks. History of smoking. Evaluate for DVT. EXAM: LEFT LOWER EXTREMITY VENOUS  DOPPLER ULTRASOUND TECHNIQUE: Gray-scale sonography with graded compression, as well as color Doppler and duplex ultrasound were performed to evaluate the lower extremity deep venous systems from the level of the common femoral vein and including the common femoral, femoral, profunda femoral, popliteal and calf veins including the posterior tibial, peroneal and gastrocnemius veins when visible. The superficial great saphenous vein was also interrogated. Spectral Doppler was utilized to evaluate flow at rest and with distal augmentation maneuvers  in the common femoral, femoral and popliteal veins. COMPARISON:  None. FINDINGS: Contralateral Common Femoral Vein: Respiratory phasicity is normal and symmetric with the symptomatic side. No evidence of thrombus. Normal compressibility. Common Femoral Vein: No evidence of thrombus. Normal compressibility, respiratory phasicity and response to augmentation. Saphenofemoral Junction: No evidence of thrombus. Normal compressibility and flow on color Doppler imaging. Profunda Femoral Vein: No evidence of thrombus. Normal compressibility and flow on color Doppler imaging. Femoral Vein: No evidence of thrombus. Normal compressibility, respiratory phasicity and response to augmentation. Popliteal Vein: No evidence of thrombus. Normal compressibility, respiratory phasicity and response to augmentation. Calf Veins: No evidence of thrombus. Normal compressibility and flow on color Doppler imaging. Superficial Great Saphenous Vein: No evidence of thrombus. Normal compressibility and flow on color Doppler imaging. Venous Reflux:  None. Other Findings:  None. IMPRESSION: No evidence of DVT within the left lower extremity. Electronically Signed   By: Simonne Come M.D.   On: 08/31/2015 12:22    EKG:   Orders placed or performed during the hospital encounter of 08/21/15  . EKG 12-Lead  . EKG 12-Lead    ASSESSMENT AND PLAN:   #1 fever/sepsis: Likely due to C. difficile colitis - Influenza negative, urine and blood cultures negative, HIV hepatitis panel negative - Clostridium difficile positive on oral vancomycin,fevers going down. - Bronchoscopy today BAL culture NTD, UA on 11/6 positive, started on meropenem and vancomycin empirically but now dc'ed - fever may also be related to ETOH withdrawal  Continue by mouth vancomycin.  #2 acute respiratory failure with hypoxia and altered mental status: resolved - Pulmonology following, bronchoscopy 11/8 with BAL, thick secretions in right airway Wbc up due to  steroids  #3 rhabdomyolysis: CK trending down,continue to monitor - Possibly due to prolonged immobilization, hypotension, diarrhea, possible withdrawal seizures prior to admission. Patient does not remember time prior to admission. - Continue hydration, , CK decreasing slowly Myalgias secondary to rhabdomyolysis.  #4 acute renal failure: secondary to ATN from one depletion, rhabdomyolysis, C. Difficile. Patient renal function is worsening today ,pt is incontinent,foley was discontinued when he was moved out  Of icu,nephro is following #5 thrombocytopenia: Improving - Possibly due to sepsis in the setting of C. difficile infection - patient also with history of heavy ETOH use. - stable, no bleeding noted  #6 elevated troponin - No concerning EKG changes, no events on telemetry.  - Heparin drip stopped due to thrombocytopenia - 2-D echocardiogram is normal with no focal wall motion abnormalities - Likely related to rhabdo - appreciate cardiology consultation  #7 altered mental status - responding appropriately today  #8 transaminitis: improving,LFTS trending down. - Viral hepatitis panel negative - Some of the AST likely due to rhabdomyolysis - Likely shock liver as well as possible ETOH hepatitis  9.bradycardia;asymptomatic, resolved.    10.left leg edema;dvt study is negative.  D/w mother and father physical therapy is recommending SNF placement  All the records are reviewed and case discussed with Care Management/Social Workerr. Management plans discussed with the patient, family and they are  in agreement.  CODE STATUS: Full   TOTAL CRITICAL CARE TIME TAKING CARE OF THIS PATIENT: 35 minutes.   POSSIBLE D/C  3-4DAYS, DEPENDING ON CLINICAL CONDITION.   Katha Hamming M.D on 08/31/2015 at 8:08 PM  Between 7am to 6pm - Pager - [phone]  After 6pm go to [email or web address] - password [email or web address] [facility]  [place] [facility]  Office  [phone]  CC: Primary care  physician; No primary care provider on file.

## 2015-08-31 NOTE — Progress Notes (Signed)
Subjective:  Foley catheter has been removed therefore difficult to measure urine at present. Creatinine remains high at 4.85.   Objective:  Vital signs in last 24 hours:  Temp:  [98 F (36.7 C)-98.2 F (36.8 C)] 98 F (36.7 C) (11/14 0414) Pulse Rate:  [85-97] 85 (11/14 1307) Resp:  [18-20] 18 (11/14 1307) BP: (152-159)/(82-83) 152/83 mmHg (11/14 1307) SpO2:  [96 %-99 %] 98 % (11/14 1307) Weight:  [124.376 kg (274 lb 3.2 oz)] 124.376 kg (274 lb 3.2 oz) (11/14 0500)  Weight change: 1.225 kg (2 lb 11.2 oz) Filed Weights   08/29/15 0456 08/30/15 0439 08/31/15 0500  Weight: 120.4 kg (265 lb 6.9 oz) 123.152 kg (271 lb 8 oz) 124.376 kg (274 lb 3.2 oz)    Intake/Output:    Intake/Output Summary (Last 24 hours) at 08/31/15 1421 Last data filed at 08/31/15 1212  Gross per 24 hour  Intake 3761.67 ml  Output      0 ml  Net 3761.67 ml     Physical Exam: General:  laying in bed  HEENT  [place]/ET EOMI hearing intact OM dry  Neck  supple  Pulm/lungs  CTAB normal effort  CVS/Heart  S1S2 no rubs  Abdomen:   soft, +bowel sounds, nontender  Extremities:  trace b/l LE edema  Neurologic:  awake, alert, confused, follows simple commands  Skin:  no acute rashes  Access: Right femoral non tunneled catheter Dr. Belia Heman 11/6       Basic Metabolic Panel:   Recent Labs Lab 08/25/15 0503 08/26/15 0515 08/26/15 1819 08/27/15 0452 08/28/15 0500 08/29/15 0530 08/30/15 0643 08/31/15 0622  NA 143 142  --  145 145 149* 144 146*  K 3.4* 3.1* 3.6 3.4* 3.6 3.0* 3.2* 3.8  CL 106 107  --  112* 115* 120* 119* 120*  CO2 28 28  --  23 21* 20* 18* 21*  GLUCOSE 95 132*  --  133* 152* 121* 119* 109*  BUN 38* 36*  --  52* 70* 83* 86* 83*  CREATININE 4.18* 3.89*  --  4.52* 4.72* 4.75* 4.93* 4.85*  CALCIUM 7.5* 8.1*  --  8.3* 8.4* 8.6* 9.1 8.5*  MG  --   --  2.0  --   --   --   --   --   PHOS 3.7 2.9  --  2.8  --   --   --   --      CBC:  Recent Labs Lab 08/25/15 0459 08/26/15 0515  08/27/15 0452 08/28/15 0500 08/28/15 1632  WBC 8.5 10.2 15.8* 17.4* 21.5*  HGB 11.4* 11.4* 12.2* 12.3* 13.1  HCT 34.6* 34.8* 37.5* 37.7* 38.9*  MCV 90.8 89.7 88.7 88.5 87.7  PLT 84* 97* 120* 145* 167      Microbiology:  Recent Results (from the past 720 hour(s))  Blood Culture (routine x 2)     Status: None   Collection Time: 08/21/15 12:47 PM  Result Value Ref Range Status   Specimen Description BLOOD UNKNOWN  Final   Special Requests BOTTLES DRAWN AEROBIC AND ANAEROBIC  1CC  Final   Culture NO GROWTH 10 DAYS  Final   Report Status 08/31/2015 FINAL  Final  Blood Culture (routine x 2)     Status: None   Collection Time: 08/21/15 12:47 PM  Result Value Ref Range Status   Specimen Description BLOOD LEFT AC  Final   Special Requests BOTTLES DRAWN AEROBIC AND ANAEROBIC  6CC  Final   Culture NO GROWTH 10  DAYS  Final   Report Status 08/31/2015 FINAL  Final  Urine culture     Status: None   Collection Time: 08/21/15 12:47 PM  Result Value Ref Range Status   Specimen Description URINE, CATHETERIZED  Final   Special Requests Normal  Final   Culture NO GROWTH 2 DAYS  Final   Report Status 08/23/2015 FINAL  Final  MRSA PCR Screening     Status: None   Collection Time: 08/21/15 11:47 PM  Result Value Ref Range Status   MRSA by PCR NEGATIVE NEGATIVE Final    Comment:        The GeneXpert MRSA Assay (FDA approved for NASAL specimens only), is one component of a comprehensive MRSA colonization surveillance program. It is not intended to diagnose MRSA infection nor to guide or monitor treatment for MRSA infections.   C difficile quick scan w PCR reflex     Status: Abnormal   Collection Time: 08/22/15  2:31 PM  Result Value Ref Range Status   C Diff antigen POSITIVE (A) NEGATIVE Final   C Diff toxin POSITIVE (A) NEGATIVE Final   C Diff interpretation   Final    Positive for toxigenic C. difficile, active toxin production present.    Comment: CRITICAL RESULT CALLED TO, READ  BACK BY AND VERIFIED WITH: CHARLIE FLEETWOOD,RN 08/22/2015 1544 BY J RAZZAKSUAREZ,MT   Culture, bal-quantitative     Status: None   Collection Time: 08/25/15  3:15 PM  Result Value Ref Range Status   Specimen Description BRONCHIAL ALVEOLAR LAVAGE  Final   Special Requests NONE  Final   Gram Stain   Final    GOOD SPECIMEN - 80-90% WBCS FEW WBC SEEN NO ORGANISMS SEEN    Culture HOLDING FOR POSSIBLE PATHOGEN  Final   Report Status 08/27/2015 FINAL  Final    Coagulation Studies: No results for input(s): LABPROT, INR in the last 72 hours.  Urinalysis: No results for input(s): COLORURINE, LABSPEC, PHURINE, GLUCOSEU, HGBUR, BILIRUBINUR, KETONESUR, PROTEINUR, UROBILINOGEN, NITRITE, LEUKOCYTESUR in the last 72 hours.  Invalid input(s): APPERANCEUR    Imaging: [place] Venous Img Lower Unilateral Left  08/31/2015  CLINICAL DATA:  Recent history of fall, now with left lower extremity pain and edema for the past 3 weeks. History of smoking. Evaluate for DVT. EXAM: LEFT LOWER EXTREMITY VENOUS DOPPLER ULTRASOUND TECHNIQUE: Gray-scale sonography with graded compression, as well as color Doppler and duplex ultrasound were performed to evaluate the lower extremity deep venous systems from the level of the common femoral vein and including the common femoral, femoral, profunda femoral, popliteal and calf veins including the posterior tibial, peroneal and gastrocnemius veins when visible. The superficial great saphenous vein was also interrogated. Spectral Doppler was utilized to evaluate flow at rest and with distal augmentation maneuvers in the common femoral, femoral and popliteal veins. COMPARISON:  None. FINDINGS: Contralateral Common Femoral Vein: Respiratory phasicity is normal and symmetric with the symptomatic side. No evidence of thrombus. Normal compressibility. Common Femoral Vein: No evidence of thrombus. Normal compressibility, respiratory phasicity and response to augmentation. Saphenofemoral  Junction: No evidence of thrombus. Normal compressibility and flow on color Doppler imaging. Profunda Femoral Vein: No evidence of thrombus. Normal compressibility and flow on color Doppler imaging. Femoral Vein: No evidence of thrombus. Normal compressibility, respiratory phasicity and response to augmentation. Popliteal Vein: No evidence of thrombus. Normal compressibility, respiratory phasicity and response to augmentation. Calf Veins: No evidence of thrombus. Normal compressibility and flow on color Doppler imaging. Superficial Great Saphenous Vein: No  evidence of thrombus. Normal compressibility and flow on color Doppler imaging. Venous Reflux:  None. Other Findings:  None. IMPRESSION: No evidence of DVT within the left lower extremity. Electronically Signed   By: Simonne Come M.D.   On: 08/31/2015 12:22     Medications:   . sodium chloride 100 mL/hr at 08/31/15 1052   . feeding supplement (ENSURE ENLIVE)  237 mL Oral BID WC  . insulin aspart  0-5 Units Subcutaneous QHS  . insulin aspart  0-9 Units Subcutaneous TID WC  . vancomycin  125 mg Oral 4 times per day   acetaminophen **OR** acetaminophen, hydrALAZINE, Influenza vac split quadrivalent PF, ipratropium-albuterol, ondansetron (ZOFRAN) IV, oxyCODONE, pneumococcal 23 valent vaccine, traZODone  Assessment/ Plan:  49 y.o. male With alcohol abuse, schizophrenia, seizure disorder, coronary disease/MI 2012 was admitted to [facility] on November 4 after being found unresponsive in his apartment. Urine toxicology screen is positive for benzodiazepines and cannabinoids. Stool positive for C. Difficile  1. Acute renal failure, likely secondary to ATN from concurrent illness, volume depletion, C. difficile, rhabdomyolysis and sepsis. Has had 3 HD treatments. -No significant improvement noted in creatinine. Difficult to no urine output now as he's incontinent. No urgent indication for dialysis however BUN and creatinine started trending up again we may need  to consider this. We will send off additional serologic workup today.  2. Acute rhabdomyolysis M62.82: CK down to 1414 today. Continue to monitor.  3.  Hypernatremia. Sodium higher at 146. DC 0.9 normal saline for half-normal saline.  4.  Hypokalemia. Improved after repletion, potassium 3.8.  5. Infective Colitis A09: c. Diff positive.  - PO vancomycin     LOS: 10 Autymn Omlor 11/14/20162:21 PM

## 2015-08-31 NOTE — Progress Notes (Signed)
Physical Therapy Treatment Patient Details Name: Ross [name] MRN: [id number] DOB: 25-May-1966 Today's Date: 08/31/2015    History of Present Illness presented to ER after being found unresponsive and noted to have seizure-like activity, intubated in ED due to respiratory failure with hypoxia; admitted with sepsis (likely related to c-diff) with multi-organ failure.  Noted with mild elevation in troponin, likely secondary to rhabdomyolysis.  Hospital course additionally significant for placement of R temp femoral dialysis catheter for HD; self-extubated on 11/9, now on RA and tolerating well.    PT Comments    New orders received this date for continuation of care (of original POC); patient without change in medical status to necessitate formal re-evaluation.  Patient now progressed to med-surg floor, RA throughout session. L LE doppler negative for DVT this date. Remains generally confused, requiring constant cuing for attention to and completion of task at hand.  Highly distractible; however, pleasant and cooperative.  Fatigues quickly with isolated UE/LE therex.  Will continue mobility progression as R temp fem cath removed and mobility appropriate.   Follow Up Recommendations  SNF     Equipment Recommendations       Recommendations for Other Services       Precautions / Restrictions Precautions Precautions: Fall Precaution Comments: R temp fem cath; enteric isolation Restrictions Weight Bearing Restrictions: No    Mobility  Bed Mobility                  Transfers                 General transfer comment: contraindicated due to R temp fem cath  Ambulation/Gait             General Gait Details: contraindicated due to R temp fem cath   Stairs            Wheelchair Mobility    Modified Rankin (Stroke Patients Only)       Balance                                    Cognition Arousal/Alertness: Awake/alert Behavior  During Therapy: WFL for tasks assessed/performed Overall Cognitive Status:  (confused to location, date and general situation; poor STM; constant cuing for attention to and recall of task at hand)                      Exercises Other Exercises Other Exercises: L LE AROM, 1x10: ankle pumps, heel slides (with resisted extension), hip abdut/adduct (with manual resistance), eccentric SLR.  R LE not assessed secondary to R temp fem cath. Other Exercises: Bilat UE AROM, 1x10: shoulder flex/ext, rowing with resistend extension, act assist D1 flex/ext, rhythmic stabilization.  No reports of pain in L UE this date; constant cuing for attention to and recall of task at hand.    General Comments        Pertinent Vitals/Pain Pain Score: 4  Pain Location: soreness L LE Pain Descriptors / Indicators: Sore Pain Intervention(s): Limited activity within patient's tolerance;Monitored during session;Repositioned    Home Living                      Prior Function            PT Goals (current goals can now be found in the care plan section) Acute Rehab PT Goals Patient Stated Goal: to move around a little  bit PT Goal Formulation: With patient Time For Goal Achievement: 09/11/15 Potential to Achieve Goals: Fair Progress towards PT goals: Progressing toward goals (mobility progression limited by placement of R temp fem cath)    Frequency  Min 2X/week    PT Plan Current plan remains appropriate    Co-evaluation             End of Session   Activity Tolerance: Patient tolerated treatment well Patient left: in bed;with call bell/phone within reach;with nursing/sitter in room     Time: 1448-1515 PT Time Calculation (min) (ACUTE ONLY): 27 min  Charges:  $Therapeutic Exercise: 23-37 mins                    G Codes:       Ozell Ferrera H. Manson PasseyBrown, PT, DPT, NCS 08/31/2015, 3:35 PM [phone]

## 2015-08-31 NOTE — Care Management Important Message (Signed)
Important Message  Patient Details  Name: Ross [name] MRN: [id number] Date of Birth: December 28, 1965   Medicare Important Message Given:  Yes    [name], Myesha Stillion L, RN 08/31/2015, 10:28 AM

## 2015-08-31 NOTE — Plan of Care (Signed)
Problem: Education: Goal: Knowledge of [facility] Education information/materials will improve Outcome: Not Progressing Pt confused  Problem: Health Behavior/Discharge Planning: Goal: Ability to manage health-related needs will improve Outcome: Not Progressing Pt confused     

## 2015-09-01 ENCOUNTER — Inpatient Hospital Stay: Payer: Medicare Other

## 2015-09-01 LAB — GLUCOSE, CAPILLARY
GLUCOSE-CAPILLARY: 87 mg/dL (ref 65–99)
GLUCOSE-CAPILLARY: 85 mg/dL (ref 65–99)
GLUCOSE-CAPILLARY: 86 mg/dL (ref 65–99)
GLUCOSE-CAPILLARY: 84 mg/dL (ref 65–99)
Glucose-Capillary: 93 mg/dL (ref 65–99)

## 2015-09-01 LAB — BASIC METABOLIC PANEL
Anion gap: 6 (ref 5–15)
BUN: 76 mg/dL — ABNORMAL HIGH (ref 6–20)
CHLORIDE: 119 mmol/L — AB (ref 101–111)
CO2: 19 mmol/L — ABNORMAL LOW (ref 22–32)
CREATININE: 4.78 mg/dL — AB (ref 0.61–1.24)
Calcium: 8.6 mg/dL — ABNORMAL LOW (ref 8.9–10.3)
GFR, EST AFRICAN AMERICAN: 15 mL/min — AB (ref >60)
GFR, EST NON AFRICAN AMERICAN: 13 mL/min — AB (ref >60)
Glucose, Bld: 89 mg/dL (ref 65–99)
Potassium: 4 mmol/L (ref 3.5–5.1)
SODIUM: 144 mmol/L (ref 135–145)

## 2015-09-01 LAB — CKMB (ARMC ONLY): CK, MB: 7.5 ng/mL — ABNORMAL HIGH (ref 0.5–5.0)

## 2015-09-01 LAB — PROTEIN ELECTRO, RANDOM URINE
ALPHA-2-GLOBULIN, U: 22.7 %
Albumin ELP, Urine: 23.6 %
Alpha-1-Globulin, U: 5.5 %
BETA GLOBULIN, U: 29.1 %
GAMMA GLOBULIN, U: 19.1 %
M Component, Ur: 19 % — ABNORMAL HIGH
TOTAL PROTEIN, URINE-UPE24: 11.9 mg/dL

## 2015-09-01 LAB — CBC
HCT: 34.2 % — ABNORMAL LOW (ref 40.0–52.0)
HEMOGLOBIN: 11.2 g/dL — AB (ref 13.0–18.0)
MCH: 28.8 pg (ref 26.0–34.0)
MCHC: 32.9 g/dL (ref 32.0–36.0)
MCV: 87.8 fL (ref 80.0–100.0)
PLATELETS: 173 10*3/uL (ref 150–440)
RBC: 3.9 MIL/uL — ABNORMAL LOW (ref 4.40–5.90)
RDW: 14.1 % (ref 11.5–14.5)
WBC: 18.5 10*3/uL — ABNORMAL HIGH (ref 3.8–10.6)

## 2015-09-01 MED ORDER — FUROSEMIDE 10 MG/ML IJ SOLN
60.0000 mg | Freq: Once | INTRAMUSCULAR | Status: AC
  Administered 2015-09-01: 60 mg via INTRAVENOUS
  Filled 2015-09-01: qty 6

## 2015-09-01 MED ORDER — HEPARIN SODIUM (PORCINE) 5000 UNIT/ML IJ SOLN
5000.0000 [IU] | Freq: Three times a day (TID) | INTRAMUSCULAR | Status: DC
  Administered 2015-09-01 – 2015-09-14 (×38): 5000 [IU] via SUBCUTANEOUS
  Filled 2015-09-01 (×38): qty 1

## 2015-09-01 MED ORDER — LORAZEPAM 0.5 MG PO TABS
0.5000 mg | ORAL_TABLET | Freq: Once | ORAL | Status: AC
  Administered 2015-09-01: 0.5 mg via ORAL
  Filled 2015-09-01: qty 1

## 2015-09-01 MED ORDER — LORAZEPAM 1 MG PO TABS
1.0000 mg | ORAL_TABLET | Freq: Once | ORAL | Status: AC
  Administered 2015-09-01: 1 mg via ORAL
  Filled 2015-09-01: qty 1

## 2015-09-01 NOTE — Progress Notes (Signed)
[facility] [place] [facility]   PATIENT NAME: Ross ManorJames Calix    MR#:  [id number]  DATE OF BIRTH:  03/16/1966  SUBJECTIVE: seen  today. Has shortness of breath. Patient received IV Lasix last night. Chest x-ray showed pulmonary edema. And denies any other complaints except shortness of breath.   CHIEF COMPLAINT:   Chief Complaint  Patient presents with  . Seizures     REVIEW OF SYSTEMS:   Review of Systems  Constitutional: Negative for fever and chills.  HENT: Negative for hearing loss.   Eyes: Negative for blurred vision, double vision and photophobia.  Respiratory: Positive for shortness of breath. Negative for cough, hemoptysis, sputum production and wheezing.   Cardiovascular: Negative for chest pain, palpitations, orthopnea and leg swelling.  Gastrointestinal: Negative for nausea, vomiting, abdominal pain and diarrhea.  Genitourinary: Negative for dysuria and urgency.  Musculoskeletal: Positive for myalgias. Negative for neck pain.  Skin: Negative for rash.  Neurological: Negative for dizziness, focal weakness, seizures, weakness and headaches.  Psychiatric/Behavioral: Negative for memory loss. The patient does not have insomnia.      DRUG ALLERGIES:   Allergies  Allergen Reactions  . Antihistamines, Diphenhydramine-Type Anaphylaxis and Rash  . Benadryl [Diphenhydramine Hcl] Anaphylaxis and Rash  . Hydroxyzine Anaphylaxis and Rash    VITALS:  Blood pressure 160/92, pulse 107, temperature 98 F (36.7 C), temperature source Oral, resp. rate 18, height 5\' 11"  (1.803 m), weight 123.016 kg (271 lb 3.2 oz), SpO2 93 %.  PHYSICAL EXAMINATION:  GENERAL:  49 y.o.-year-old patient weak, sitting up eating lunch with assistance LUNGS: CTAB, good air movement CARDIOVASCULAR: S1, S2 normal. No murmurs, rubs, or gallops.  ABDOMEN: Soft, nontender, nondistended. Bowel sounds present. No organomegaly or mass.  EXTREMITIES: left leg edema. cyanosis,  or clubbing. Peripheral pulses 2+ NEUROLOGIC: alert, CN's grossly intact, moves all extremities, oriented to person, follows comands SKIN: No obvious rash, lesion, or ulcer.    LABORATORY PANEL:   CBC  Recent Labs Lab 08/28/15 1632  WBC 21.5*  HGB 13.1  HCT 38.9*  PLT 167   ------------------------------------------------------------------------------------------------------------------  Chemistries   Recent Labs Lab 08/26/15 1819  08/28/15 0500  09/01/15 0541  NA  --   < > 145  < > 144  K 3.6  < > 3.6  < > 4.0  CL  --   < > 115*  < > 119*  CO2  --   < > 21*  < > 19*  GLUCOSE  --   < > 152*  < > 89  BUN  --   < > 70*  < > 76*  CREATININE  --   < > 4.72*  < > 4.78*  CALCIUM  --   < > 8.4*  < > 8.6*  MG 2.0  --   --   --   --   AST  --   < > 474*  --   --   ALT  --   < > 270*  --   --   ALKPHOS  --   < > 361*  --   --   BILITOT  --   < > 1.0  --   --   < > = values in this interval not displayed. ------------------------------------------------------------------------------------------------------------------  Cardiac Enzymes No results for input(s): TROPONINI in the last 168 hours. ------------------------------------------------------------------------------------------------------------------  RADIOLOGY:  Dg Chest 1 View  09/01/2015  CLINICAL DATA:  Acute onset of sternal chest pain. Found unresponsive, with  seizure like activity. Initial encounter. EXAM: CHEST 1 VIEW COMPARISON:  Chest radiograph performed 08/26/2015 FINDINGS: There is an increasing moderate left-sided pleural effusion, with left-sided airspace opacification, which may reflect asymmetric pulmonary edema or pneumonia. Underlying vascular congestion is noted. The right lung is otherwise clear. No pneumothorax is seen. The cardiomediastinal silhouette is borderline normal in size. The right IJ line is noted ending about the distal SVC. No acute osseous abnormalities are seen. IMPRESSION: Increasing  moderate left-sided pleural effusion, with left-sided airspace opacification, which may reflect asymmetric pulmonary edema or pneumonia. Underlying vascular congestion noted. Electronically Signed   By: Roanna Raider M.D.   On: 09/01/2015 03:39   US Venous Img Lower Unilateral Left  08/31/2015  CLINICAL DATA:  Recent history of fall, now with left lower extremity pain and edema for the past 3 weeks. History of smoking. Evaluate for DVT. EXAM: LEFT LOWER EXTREMITY VENOUS DOPPLER ULTRASOUND TECHNIQUE: Gray-scale sonography with graded compression, as well as color Doppler and duplex ultrasound were performed to evaluate the lower extremity deep venous systems from the level of the common femoral vein and including the common femoral, femoral, profunda femoral, popliteal and calf veins including the posterior tibial, peroneal and gastrocnemius veins when visible. The superficial great saphenous vein was also interrogated. Spectral Doppler was utilized to evaluate flow at rest and with distal augmentation maneuvers in the common femoral, femoral and popliteal veins. COMPARISON:  None. FINDINGS: Contralateral Common Femoral Vein: Respiratory phasicity is normal and symmetric with the symptomatic side. No evidence of thrombus. Normal compressibility. Common Femoral Vein: No evidence of thrombus. Normal compressibility, respiratory phasicity and response to augmentation. Saphenofemoral Junction: No evidence of thrombus. Normal compressibility and flow on color Doppler imaging. Profunda Femoral Vein: No evidence of thrombus. Normal compressibility and flow on color Doppler imaging. Femoral Vein: No evidence of thrombus. Normal compressibility, respiratory phasicity and response to augmentation. Popliteal Vein: No evidence of thrombus. Normal compressibility, respiratory phasicity and response to augmentation. Calf Veins: No evidence of thrombus. Normal compressibility and flow on color Doppler imaging. Superficial  Great Saphenous Vein: No evidence of thrombus. Normal compressibility and flow on color Doppler imaging. Venous Reflux:  None. Other Findings:  None. IMPRESSION: No evidence of DVT within the left lower extremity. Electronically Signed   By: Simonne Come M.D.   On: 08/31/2015 12:22    EKG:   Orders placed or performed during the hospital encounter of 08/21/15  . EKG 12-Lead  . EKG 12-Lead    ASSESSMENT AND PLAN:   #1 fever/sepsis: Likely due to C. difficile colitis - Influenza negative, urine and blood cultures negative, HIV hepatitis panel negative - Clostridium difficile positive on oral vancomycin,fevers going down. -  #2 acute respiratory failure with hypoxia and altered mental status: resolved - Pulmonology following, bronchoscopy 11/8 with BAL, thick secretions in right airway -  #3 rhabdomyolysis: CK trending down,continue to monitor - Possibly due to prolonged immobilization, hypotension, diarrhea, possible withdrawal seizures prior to admission. Patient does not remember time prior to admission. Hold off on IV fluids secondary to shortness of breath and pulmonary edema at this time., , CK decreasing slowly Myalgias secondary to rhabdomyolysis.  #4 acute renal failure: secondary to ATN from one depletion, rhabdomyolysis, C. Difficile. Patient renal function is worsening today , Pulmonary  edema, shortness of breath,, and renal failure: Patient needs hemodialysis, appreciate nephrology help. A#5 thrombocytopenia: Improving - Possibly due to sepsis in the setting of C. difficile infection - patient also with history of heavy ETOH  use. - stable, no bleeding noted  #6 elevated troponin - No concerning EKG changes, no events on telemetry.  - Heparin drip stopped due to thrombocytopenia - 2-D echocardiogram is normal with no focal wall motion abnormalities - Likely related to rhabdo - appreciate cardiology consultation  #7 altered mental status - responding appropriately  today  #8 transaminitis: improving,LFTS trending down. - Viral hepatitis panel negative - Some of the AST likely due to rhabdomyolysis - Likely shock liver as well as possible ETOH hepatitis  9.bradycardia;asymptomatic, resolved.    10.left leg edema;dvt study is negative.  D/w mother and father physical therapy is recommending SNF placement  All the records are reviewed and case discussed with Care Management/Social Workerr. Management plans discussed with the patient, family and they are in agreement.  CODE STATUS: Full   TOTAL CRITICAL CARE TIME TAKING CARE OF THIS PATIENT: 35 minutes.   POSSIBLE D/C  3-4DAYS, DEPENDING ON CLINICAL CONDITION.   Katha Hamming M.D on 09/01/2015 at 1:47 PM  Between 7am to 6pm - Pager - [phone]  After 6pm go to [email or web address] - password [email or web address] [facility]  [facility]  Office  [phone]  CC: Primary care physician; No primary care provider on file.

## 2015-09-01 NOTE — NC FL2 (Signed)
[place] MEDICAID FL2 LEVEL OF CARE SCREENING TOOL     IDENTIFICATION  Patient Name: Ross [name] Birthdate: September 26, 1966 Sex: male Admission Date (Current Location): 08/21/2015  [facility] [place]:     Facility and Address:  [facility], [address], [place], [place] [address]      Provider Number: [id number]  Attending Physician Name and Address:  Katha HammingSnehalatha Konidena, MD  Relative Name and Phone Number:       Current Level of Care: Hospital Recommended Level of Care: Skilled Nursing Facility Prior Approval Number:    Date Approved/Denied:   PASRR Number:    Discharge Plan: SNF    Current Diagnoses: Patient Active Problem List   Diagnosis Date Noted  . Acute respiratory failure with hypoxia (HCC)   . Altered mental status   . Acute respiratory failure (HCC) 08/23/2015  . Enteritis due to Clostridium difficile 08/23/2015  . Rhabdomyolysis 08/23/2015  . Acute renal failure (HCC) 08/23/2015  . Sepsis (HCC) 08/21/2015  . Psychoses   . Psychosis 03/28/2015  . HTN (hypertension) 09/12/2011  . Aspiration pneumonia (HCC) 09/11/2011  . RUQ abdominal pain 09/11/2011  . SOB (shortness of breath) 09/11/2011  . Pneumonia 09/04/2011  . Fever 09/04/2011  . Acute pancreatitis 09/03/2011  . Abdominal pain 09/03/2011  . Acute encephalopathy 08/28/2011  . Hypernatremia 08/27/2011  . Seizure (HCC) 08/23/2011  . Poisoning by anticholinergic drug 08/23/2011  . Eczema 08/23/2011  . Papular rash, generalized 08/23/2011  . HTN (hypertension), malignant 08/23/2011  . Hyperglycemia 08/23/2011  . PTSD (post-traumatic stress disorder) 08/23/2011  . Cardiac arrest (HCC) 08/23/2011  . QT prolongation 08/23/2011    Orientation ACTIVITIES/SOCIAL BLADDER RESPIRATION    Self, Place  Active Incontinent Normal  BEHAVIORAL SYMPTOMS/MOOD NEUROLOGICAL BOWEL NUTRITION STATUS   (none) Convulsions/Seizures Incontinent Diet  PHYSICIAN VISITS  COMMUNICATION OF NEEDS Height & Weight Skin  30 days Verbally   271 lbs. Normal          AMBULATORY STATUS RESPIRATION    Assist extensive Normal      Personal Care Assistance Level of Assistance  Bathing, Dressing Bathing Assistance: Maximum assistance   Dressing Assistance: Maximum assistance      Functional Limitations Info   (none)             SPECIAL CARE FACTORS FREQUENCY  PT (By licensed PT), OT (By licensed OT)                   Additional Factors Info  Code Status, Allergies Code Status Info: Full Allergies Info: antihistamines;benadryl;hydroxyzone           Current Medications (09/01/2015): Current Facility-Administered Medications  Medication Dose Route Frequency Provider Last Rate Last Dose  . acetaminophen (TYLENOL) tablet 650 mg  650 mg Oral Q6H PRN Stephanie AcreVishal Mungal, MD   650 mg at 08/27/15 1954   Or  . acetaminophen (TYLENOL) suppository 650 mg  650 mg Rectal Q6H PRN Vishal Mungal, MD      . feeding supplement (ENSURE ENLIVE) (ENSURE ENLIVE) liquid 237 mL  237 mL Oral BID WC Gale Journeyatherine P Walsh, MD   237 mL at 09/01/15 0834  . hydrALAZINE (APRESOLINE) injection 10 mg  10 mg Intravenous Q4H PRN Oralia Manisavid Willis, MD      . Influenza vac split quadrivalent PF (FLUARIX) injection 0.5 mL  0.5 mL Intramuscular Prior to discharge Katha HammingSnehalatha Konidena, MD      . insulin aspart (novoLOG) injection 0-5 Units  0-5 Units Subcutaneous QHS Santina Evansatherine  Barnett Abu, MD   1 Units at 08/29/15 1834  . insulin aspart (novoLOG) injection 0-9 Units  0-9 Units Subcutaneous TID WC Gale Journey, MD   1 Units at 08/30/15 1216  . ipratropium-albuterol (DUONEB) 0.5-2.5 (3) MG/3ML nebulizer solution 3 mL  3 mL Nebulization Q4H PRN Vishal Mungal, MD      . ondansetron (ZOFRAN) injection 4 mg  4 mg Intravenous Q8H PRN Vishal Mungal, MD   4 mg at 08/28/15 1052  . oxyCODONE (Oxy IR/ROXICODONE) immediate release tablet 5 mg  5 mg Oral Q6H PRN Oralia Manis, MD   5 mg at 09/01/15 0240  .  pneumococcal 23 valent vaccine (PNU-IMMUNE) injection 0.5 mL  0.5 mL Intramuscular Prior to discharge Katha Hamming, MD      . traZODone (DESYREL) tablet 25 mg  25 mg Oral QHS PRN Oralia Manis, MD   25 mg at 09/01/15 0028  . vancomycin (VANCOCIN) 50 mg/mL oral solution 125 mg  125 mg Oral 4 times per day Stephanie Acre, MD   125 mg at 09/01/15 0533   Do not use this list as official medication orders. Please verify with discharge summary.  Discharge Medications:   Medication List    ASK your doctor about these medications        acetaminophen 325 MG tablet  Commonly known as:  TYLENOL  Take 650 mg by mouth every 4 (four) hours as needed for mild pain or fever.     fluticasone 50 MCG/ACT nasal spray  Commonly known as:  FLONASE  Place 1-2 sprays into both nostrils at bedtime.     folic acid 1 MG tablet  Commonly known as:  FOLVITE  Take 1 mg by mouth daily.     Melatonin 3 MG Tabs  Take 3 mg by mouth at bedtime.     metFORMIN 500 MG tablet  Commonly known as:  GLUCOPHAGE  Take 500 mg by mouth daily.     metroNIDAZOLE 500 MG tablet  Commonly known as:  FLAGYL  Take 1 tablet (500 mg total) by mouth 3 (three) times daily.     multivitamin with minerals Tabs tablet  Take 1 tablet by mouth daily.     naltrexone 50 MG tablet  Commonly known as:  DEPADE  Take 50 mg by mouth daily.     thiamine 100 MG tablet  Take 100 mg by mouth daily.     triamcinolone cream 0.1 %  Commonly known as:  KENALOG  Apply 1 application topically 2 (two) times daily as needed (for rash).        Relevant Imaging Results:  Relevant Lab Results:  Recent Labs    Additional Information SS: [id number]  York Spaniel, LCSW

## 2015-09-01 NOTE — Clinical Social Work Note (Signed)
Clinical Social Work Assessment  Patient Details  Name: Ross [name] MRN: [id number] Date of Birth: 1966/05/14  Date of referral:  09/01/15               Reason for consult:  Facility Placement, Mental Health Concerns                Permission sought to share information with:    Permission granted to share information::     Name::        Agency::     Relationship::     Contact Information:     Housing/Transportation Living arrangements for the past 2 months:  Apartment Source of Information:  Parent Patient Interpreter Needed:  None Criminal Activity/Legal Involvement Pertinent to Current Situation/Hospitalization:  No - Comment as needed Significant Relationships:  Parents Lives with:  Self Do you feel safe going back to the place where you live?    Need for family participation in patient care:     Care giving concerns:  Patient lives alone.   Social Worker assessment / plan:  Patient with intermittent confusion. Patient's emergency contact is listed as his mother: Ross [name] (number listed on facesheet). CSW called Ms. Michon and she confirmed that patient lives alone and relies on friends to transport him as he stopped driving a while ago due to becoming paranoid when he would drive. Ms. Cletus GashWitherspoon is not sure if patient has been following with a psychiatrist or if he is taking medication for his schizophrenia. She did inform CSW that patient is a veteran and [place] and has PTSD from this. Patient's mother stated that patient has been able to obtain his necessities and has had an issue with drinking for a while. CSW explained the purpose of my call and she stated that she would be in agreement if it is recommended for short term rehab for patient. Patient's mother stated that patient's father would also be in agreement and that she is going out of town in a couple of days and that we can follow up with patient's father: Ross [name]: [phone].  Bedsearch intitiated today and pasrr initiated yesterday and is pending.  Patient currently is incontinent and PT consult is pending.   Employment status:  Disabled (Comment on whether or not currently receiving Disability) Insurance information:  Medicare PT Recommendations:  Skilled Nursing Facility Information / Referral to community resources:  Skilled Nursing Facility  Patient/Family's Response to care:  Patient's mother willing to assist where needed.  Patient/Family's Understanding of and Emotional Response to Diagnosis, Current Treatment, and Prognosis:  Patient's mother expressed appreciation for CSW assistance.  Emotional Assessment Appearance:    Attitude/Demeanor/Rapport:    Affect (typically observed):    Orientation:  Oriented to Self, Oriented to Place, Fluctuating Orientation (Suspected and/or reported Sundowners) Alcohol / Substance use:  Alcohol Use Psych involvement (Current and /or in the community):   (possibly outpatient but mom cannot confirm this)  Discharge Needs  Concerns to be addressed:  Care Coordination Readmission within the last 30 days:  No Current discharge risk:  None Barriers to Discharge:  No Barriers Identified   York SpanielMonica Annison Birchard, LCSW 09/01/2015, 10:07 AM

## 2015-09-01 NOTE — Progress Notes (Signed)
Notified Dr. Luberta MutterKonidena that patient did cough up a small amount of blood

## 2015-09-01 NOTE — Progress Notes (Signed)
Patient has worsening edema. CXR ordered by Dr. Anne HahnWillis reviewed. Pleural effusions with pulm edema. Ordered 1 dose lasix IV 60mg .

## 2015-09-01 NOTE — Progress Notes (Signed)
Pt complaining of ADB and chest tight. On assessment abd distended, edema in the legs, thighs and generalized. Pt. Using the urinal quite frequently with output at or about 25 mls and at times all over himself. Pt's gown has been changed at least 4 timed from start of shift to now.Pt also anxious and stating that he wants to sleep but can't.  MD notified and orders placed to DC fluids, stat chest Xray and Ativan.

## 2015-09-01 NOTE — Progress Notes (Signed)
Result of chest xray on file, MD made aware, orders placed.

## 2015-09-01 NOTE — Progress Notes (Signed)
Nutrition Follow-up     INTERVENTION:  Meals and snacks: Cater to pt preferences Medical Nutrition Supplement Therapy: Continue ensure BID for added nutrition   NUTRITION DIAGNOSIS:   Inadequate oral intake related to acute illness as evidenced by NPO status, on po diet    GOAL:   Provide needs based on ASPEN/SCCM guidelines  Progressing towards goal  MONITOR:    (Energy intake, Digestive system, Electrolyte and renal profile)  REASON FOR ASSESSMENT:   Ventilator    ASSESSMENT:      Pt with pleural effusion, fever, sepsis, c-diff   Current Nutrition: did not eat much breakfast this am, tray at bedside. 2 ensure at bedside untouched.  Pt reports fruit causing increased diarrhea.  Encouraged pt not to eat fruit but focus on lean proteins, bland foods   Gastrointestinal Profile: Last BM: 11/15   Scheduled Medications:  . feeding supplement (ENSURE ENLIVE)  237 mL Oral BID WC  . insulin aspart  0-5 Units Subcutaneous QHS  . insulin aspart  0-9 Units Subcutaneous TID WC  . vancomycin  125 mg Oral 4 times per day      Electrolyte/Renal Profile and Glucose Profile:   Recent Labs Lab 08/26/15 0515 08/26/15 1819 08/27/15 0452  08/30/15 0643 08/31/15 0622 09/01/15 0541  NA 142  --  145  < > 144 146* 144  K 3.1* 3.6 3.4*  < > 3.2* 3.8 4.0  CL 107  --  112*  < > 119* 120* 119*  CO2 28  --  23  < > 18* 21* 19*  BUN 36*  --  52*  < > 86* 83* 76*  CREATININE 3.89*  --  4.52*  < > 4.93* 4.85* 4.78*  CALCIUM 8.1*  --  8.3*  < > 9.1 8.5* 8.6*  MG  --  2.0  --   --   --   --   --   PHOS 2.9  --  2.8  --   --   --   --   GLUCOSE 132*  --  133*  < > 119* 109* 89  < > = values in this interval not displayed. Protein Profile:  Recent Labs Lab 08/26/15 0515 08/27/15 0452 08/28/15 0500  ALBUMIN 2.1*  2.1* 2.1*  2.1* 2.0*      Weight Trend since Admission: Filed Weights   08/30/15 0439 08/31/15 0500 09/01/15 0544  Weight: 271 lb 8 oz (123.152 kg) 274 lb  3.2 oz (124.376 kg) 271 lb 3.2 oz (123.016 kg)      Diet Order:  DIET DYS 3 Room service appropriate?: Yes; Fluid consistency:: Thin  Skin:   reviewed   Height:   Ht Readings from Last 1 Encounters:  08/21/15 5\' 11"  (1.803 m)    Weight:   Wt Readings from Last 1 Encounters:  09/01/15 271 lb 3.2 oz (123.016 kg)     BMI:  Body mass index is 37.84 kg/(m^2).  Estimated Nutritional Needs:   Kcal:  1950-2340 kcals using IBW 78 kg  Protein:  94-109 g (1.2-1.4 g/kg)   Fluid:  (25-830ml/kg) 1950-238240ml/d  EDUCATION NEEDS:   No education needs identified at this time  MODERATE Care Level  Jolynn Bajorek B. Freida BusmanAllen, RD, LDN [phone] (pager)

## 2015-09-01 NOTE — Progress Notes (Addendum)
Okay per Dr. Luberta MutterKonidena to change labs to CK total and CK MB instead of send out lab previously ordered

## 2015-09-01 NOTE — Care Management (Signed)
[facility] with [facility] at [facility]. Patient would meet for LTAC and this may be an option. Discussed with attending,  Dr Luberta MutterKonidena who would like to speak with Dr Cherylann RatelLateef nephrology concerning this possibility. More to follow.

## 2015-09-01 NOTE — Progress Notes (Signed)
Subjective:  Creatinine about the same at 4.7. BUN down to 76. Urine output at 2.4 L over the past 24 hours. Had some shortness of breath last night.  Objective:  Vital signs in last 24 hours:  Pulse Rate:  [81-107] 107 (11/15 0544) Resp:  [18] 18 (11/14 1807) BP: (158-160)/(86-92) 160/92 mmHg (11/15 0544) SpO2:  [93 %-99 %] 93 % (11/14 1807) Weight:  [123.016 kg (271 lb 3.2 oz)] 123.016 kg (271 lb 3.2 oz) (11/15 0544)  Weight change: -1.361 kg (-3 lb) Filed Weights   08/30/15 0439 08/31/15 0500 09/01/15 0544  Weight: 123.152 kg (271 lb 8 oz) 124.376 kg (274 lb 3.2 oz) 123.016 kg (271 lb 3.2 oz)    Intake/Output:    Intake/Output Summary (Last 24 hours) at 09/01/15 1501 Last data filed at 09/01/15 1428  Gross per 24 hour  Intake    653 ml  Output   2402 ml  Net  -1749 ml     Physical Exam: General:  laying in bed  HEENT  [place]/ET EOMI hearing intact OM moist  Neck  supple  Pulm/lungs  CTAB normal effort  CVS/Heart  S1S2 no rubs  Abdomen:   soft, +bowel sounds, nontender  Extremities:  trace b/l LE edema  Neurologic:  awake, alert, confused, follows simple commands  Skin:  no acute rashes  Access: Right femoral non tunneled catheter Dr. Belia HemanKasa 11/6       Basic Metabolic Panel:   Recent Labs Lab 08/26/15 0515 08/26/15 1819 08/27/15 0452 08/28/15 0500 08/29/15 0530 08/30/15 0643 08/31/15 0622 09/01/15 0541  NA 142  --  145 145 149* 144 146* 144  K 3.1* 3.6 3.4* 3.6 3.0* 3.2* 3.8 4.0  CL 107  --  112* 115* 120* 119* 120* 119*  CO2 28  --  23 21* 20* 18* 21* 19*  GLUCOSE 132*  --  133* 152* 121* 119* 109* 89  BUN 36*  --  52* 70* 83* 86* 83* 76*  CREATININE 3.89*  --  4.52* 4.72* 4.75* 4.93* 4.85* 4.78*  CALCIUM 8.1*  --  8.3* 8.4* 8.6* 9.1 8.5* 8.6*  MG  --  2.0  --   --   --   --   --   --   PHOS 2.9  --  2.8  --   --   --   --   --      CBC:  Recent Labs Lab 08/26/15 0515 08/27/15 0452 08/28/15 0500 08/28/15 1632  WBC 10.2 15.8* 17.4* 21.5*   HGB 11.4* 12.2* 12.3* 13.1  HCT 34.8* 37.5* 37.7* 38.9*  MCV 89.7 88.7 88.5 87.7  PLT 97* 120* 145* 167      Microbiology:  Recent Results (from the past 720 hour(s))  Blood Culture (routine x 2)     Status: None   Collection Time: 08/21/15 12:47 PM  Result Value Ref Range Status   Specimen Description BLOOD UNKNOWN  Final   Special Requests BOTTLES DRAWN AEROBIC AND ANAEROBIC  1CC  Final   Culture NO GROWTH 10 DAYS  Final   Report Status 08/31/2015 FINAL  Final  Blood Culture (routine x 2)     Status: None   Collection Time: 08/21/15 12:47 PM  Result Value Ref Range Status   Specimen Description BLOOD LEFT AC  Final   Special Requests BOTTLES DRAWN AEROBIC AND ANAEROBIC  6CC  Final   Culture NO GROWTH 10 DAYS  Final   Report Status 08/31/2015 FINAL  Final  Urine culture     Status: None   Collection Time: 08/21/15 12:47 PM  Result Value Ref Range Status   Specimen Description URINE, CATHETERIZED  Final   Special Requests Normal  Final   Culture NO GROWTH 2 DAYS  Final   Report Status 08/23/2015 FINAL  Final  MRSA PCR Screening     Status: None   Collection Time: 08/21/15 11:47 PM  Result Value Ref Range Status   MRSA by PCR NEGATIVE NEGATIVE Final    Comment:        The GeneXpert MRSA Assay (FDA approved for NASAL specimens only), is one component of a comprehensive MRSA colonization surveillance program. It is not intended to diagnose MRSA infection nor to guide or monitor treatment for MRSA infections.   C difficile quick scan w PCR reflex     Status: Abnormal   Collection Time: 08/22/15  2:31 PM  Result Value Ref Range Status   C Diff antigen POSITIVE (A) NEGATIVE Final   C Diff toxin POSITIVE (A) NEGATIVE Final   C Diff interpretation   Final    Positive for toxigenic C. difficile, active toxin production present.    Comment: CRITICAL RESULT CALLED TO, READ BACK BY AND VERIFIED WITH: CHARLIE FLEETWOOD,RN 08/22/2015 1544 BY J RAZZAKSUAREZ,MT   Culture,  bal-quantitative     Status: None   Collection Time: 08/25/15  3:15 PM  Result Value Ref Range Status   Specimen Description BRONCHIAL ALVEOLAR LAVAGE  Final   Special Requests NONE  Final   Gram Stain   Final    GOOD SPECIMEN - 80-90% WBCS FEW WBC SEEN NO ORGANISMS SEEN    Culture HOLDING FOR POSSIBLE PATHOGEN  Final   Report Status 08/27/2015 FINAL  Final    Coagulation Studies: No results for input(s): LABPROT, INR in the last 72 hours.  Urinalysis: No results for input(s): COLORURINE, LABSPEC, PHURINE, GLUCOSEU, HGBUR, BILIRUBINUR, KETONESUR, PROTEINUR, UROBILINOGEN, NITRITE, LEUKOCYTESUR in the last 72 hours.  Invalid input(s): APPERANCEUR    Imaging: Dg Chest 1 View  09/01/2015  CLINICAL DATA:  Acute onset of sternal chest pain. Found unresponsive, with seizure like activity. Initial encounter. EXAM: CHEST 1 VIEW COMPARISON:  Chest radiograph performed 08/26/2015 FINDINGS: There is an increasing moderate left-sided pleural effusion, with left-sided airspace opacification, which may reflect asymmetric pulmonary edema or pneumonia. Underlying vascular congestion is noted. The right lung is otherwise clear. No pneumothorax is seen. The cardiomediastinal silhouette is borderline normal in size. The right IJ line is noted ending about the distal SVC. No acute osseous abnormalities are seen. IMPRESSION: Increasing moderate left-sided pleural effusion, with left-sided airspace opacification, which may reflect asymmetric pulmonary edema or pneumonia. Underlying vascular congestion noted. Electronically Signed   By: Roanna Raider M.D.   On: 09/01/2015 03:39   US Venous Img Lower Unilateral Left  08/31/2015  CLINICAL DATA:  Recent history of fall, now with left lower extremity pain and edema for the past 3 weeks. History of smoking. Evaluate for DVT. EXAM: LEFT LOWER EXTREMITY VENOUS DOPPLER ULTRASOUND TECHNIQUE: Gray-scale sonography with graded compression, as well as color Doppler and  duplex ultrasound were performed to evaluate the lower extremity deep venous systems from the level of the common femoral vein and including the common femoral, femoral, profunda femoral, popliteal and calf veins including the posterior tibial, peroneal and gastrocnemius veins when visible. The superficial great saphenous vein was also interrogated. Spectral Doppler was utilized to evaluate flow at rest and with distal augmentation maneuvers in the  common femoral, femoral and popliteal veins. COMPARISON:  None. FINDINGS: Contralateral Common Femoral Vein: Respiratory phasicity is normal and symmetric with the symptomatic side. No evidence of thrombus. Normal compressibility. Common Femoral Vein: No evidence of thrombus. Normal compressibility, respiratory phasicity and response to augmentation. Saphenofemoral Junction: No evidence of thrombus. Normal compressibility and flow on color Doppler imaging. Profunda Femoral Vein: No evidence of thrombus. Normal compressibility and flow on color Doppler imaging. Femoral Vein: No evidence of thrombus. Normal compressibility, respiratory phasicity and response to augmentation. Popliteal Vein: No evidence of thrombus. Normal compressibility, respiratory phasicity and response to augmentation. Calf Veins: No evidence of thrombus. Normal compressibility and flow on color Doppler imaging. Superficial Great Saphenous Vein: No evidence of thrombus. Normal compressibility and flow on color Doppler imaging. Venous Reflux:  None. Other Findings:  None. IMPRESSION: No evidence of DVT within the left lower extremity. Electronically Signed   By: Simonne Come M.D.   On: 08/31/2015 12:22     Medications:     . feeding supplement (ENSURE ENLIVE)  237 mL Oral BID WC  . heparin subcutaneous  5,000 Units Subcutaneous 3 times per day  . insulin aspart  0-5 Units Subcutaneous QHS  . insulin aspart  0-9 Units Subcutaneous TID WC  . vancomycin  125 mg Oral 4 times per day    acetaminophen **OR** acetaminophen, hydrALAZINE, Influenza vac split quadrivalent PF, ipratropium-albuterol, ondansetron (ZOFRAN) IV, oxyCODONE, pneumococcal 23 valent vaccine, traZODone  Assessment/ Plan:  49 y.o. male With alcohol abuse, schizophrenia, seizure disorder, coronary disease/MI 2012 was admitted to [facility] on November 4 after being found unresponsive in his apartment. Urine toxicology screen is positive for benzodiazepines and cannabinoids. Stool positive for C. Difficile  1. Acute renal failure, likely secondary to ATN from concurrent illness, volume depletion, C. difficile, rhabdomyolysis and sepsis. Has had 3 HD treatments. -Renal function very slowly trending down. BUN down to 76 with a creatinine of 4.7. Rhabdomyolysis has significantly improved. However renal function not improving as fast as hoped. Hold off on dialysis for now however we will consider this if volume is a continuous issue.  2. Acute rhabdomyolysis M62.82: Significantly improved from admission.  3.  Hypernatremia. Sodium down to 144 today. Patient taken off of D5.  4.  Hypokalemia. Potassium normalized at 4.0. Continue to monitor.  5. Infective Colitis A09: c. Diff positive.  - PO vancomycin     LOS: 11 Melania Kirks 11/15/20163:01 PM

## 2015-09-02 ENCOUNTER — Inpatient Hospital Stay: Payer: Medicare Other

## 2015-09-02 DIAGNOSIS — J189 Pneumonia, unspecified organism: Secondary | ICD-10-CM

## 2015-09-02 LAB — GLUCOSE, CAPILLARY
Glucose-Capillary: 89 mg/dL (ref 65–99)
Glucose-Capillary: 124 mg/dL — ABNORMAL HIGH (ref 65–99)
Glucose-Capillary: 96 mg/dL (ref 65–99)
Glucose-Capillary: 105 mg/dL — ABNORMAL HIGH (ref 65–99)

## 2015-09-02 LAB — ANA W/REFLEX IF POSITIVE: ANA: NEGATIVE

## 2015-09-02 LAB — BASIC METABOLIC PANEL
Anion gap: 11 (ref 5–15)
BUN: 77 mg/dL — AB (ref 6–20)
CO2: 17 mmol/L — ABNORMAL LOW (ref 22–32)
CREATININE: 4.91 mg/dL — AB (ref 0.61–1.24)
Calcium: 8.6 mg/dL — ABNORMAL LOW (ref 8.9–10.3)
Chloride: 118 mmol/L — ABNORMAL HIGH (ref 101–111)
GFR calc Af Amer: 15 mL/min — ABNORMAL LOW (ref >60)
GFR, EST NON AFRICAN AMERICAN: 13 mL/min — AB (ref >60)
GLUCOSE: 100 mg/dL — AB (ref 65–99)
POTASSIUM: 4.2 mmol/L (ref 3.5–5.1)
SODIUM: 146 mmol/L — AB (ref 135–145)

## 2015-09-02 LAB — MRSA PCR SCREENING: MRSA BY PCR: NEGATIVE

## 2015-09-02 LAB — GLOMERULAR BASEMENT MEMBRANE ANTIBODIES: GBM Ab: 8 units (ref 0–20)

## 2015-09-02 LAB — ANTISTREPTOLYSIN O TITER: ASO: 91 IU/mL (ref 0.0–200.0)

## 2015-09-02 LAB — C3 COMPLEMENT: C3 COMPLEMENT: 113 mg/dL (ref 82–167)

## 2015-09-02 LAB — MPO/PR-3 (ANCA) ANTIBODIES: ANCA Proteinase 3: <3.5 U/mL (ref 0.0–3.5)

## 2015-09-02 LAB — TRIGLYCERIDES: Triglycerides: 88 mg/dL (ref <150)

## 2015-09-02 LAB — C4 COMPLEMENT: COMPLEMENT C4, BODY FLUID: 27 mg/dL (ref 14–44)

## 2015-09-02 MED ORDER — NOREPINEPHRINE BITARTRATE 1 MG/ML IV SOLN
0.0000 ug/min | INTRAVENOUS | Status: DC
  Administered 2015-09-02: 12 ug/min via INTRAVENOUS

## 2015-09-02 MED ORDER — NOREPINEPHRINE 4 MG/250ML-% IV SOLN
0.0000 ug/min | INTRAVENOUS | Status: DC
  Administered 2015-09-02 (×2): 4 mg via INTRAVENOUS
  Administered 2015-09-03: 5 ug/min via INTRAVENOUS
  Administered 2015-09-03: 10 ug/min via INTRAVENOUS
  Administered 2015-09-04: 3 ug/min via INTRAVENOUS
  Administered 2015-09-05: 3.013 ug/min via INTRAVENOUS
  Filled 2015-09-02 (×3): qty 250

## 2015-09-02 MED ORDER — PANTOPRAZOLE SODIUM 40 MG IV SOLR
40.0000 mg | Freq: Every day | INTRAVENOUS | Status: DC
  Administered 2015-09-02 – 2015-09-06 (×5): 40 mg via INTRAVENOUS
  Filled 2015-09-02 (×5): qty 40

## 2015-09-02 MED ORDER — PANTOPRAZOLE SODIUM 40 MG PO TBEC: 40.0000 mg | DELAYED_RELEASE_TABLET | Freq: Every day | ORAL | Status: DC

## 2015-09-02 MED ORDER — HALOPERIDOL LACTATE 5 MG/ML IJ SOLN
5.0000 mg | Freq: Once | INTRAMUSCULAR | Status: AC
  Administered 2015-09-02: 5 mg via INTRAVENOUS
  Filled 2015-09-02: qty 1

## 2015-09-02 MED ORDER — PIPERACILLIN-TAZOBACTAM 3.375 G IVPB
3.3750 g | Freq: Two times a day (BID) | INTRAVENOUS | Status: DC
  Filled 2015-09-02 (×2): qty 50

## 2015-09-02 MED ORDER — NOREPINEPHRINE 4 MG/250ML-% IV SOLN
INTRAVENOUS | Status: AC
  Administered 2015-09-02: 4 mg via INTRAVENOUS
  Filled 2015-09-02: qty 250

## 2015-09-02 MED ORDER — IPRATROPIUM-ALBUTEROL 0.5-2.5 (3) MG/3ML IN SOLN
3.0000 mL | RESPIRATORY_TRACT | Status: DC
  Administered 2015-09-02 – 2015-09-07 (×29): 3 mL via RESPIRATORY_TRACT
  Filled 2015-09-02 (×27): qty 3

## 2015-09-02 MED ORDER — PIPERACILLIN-TAZOBACTAM 3.375 G IVPB
3.3750 g | Freq: Two times a day (BID) | INTRAVENOUS | Status: DC
  Administered 2015-09-02 – 2015-09-05 (×6): 3.375 g via INTRAVENOUS
  Filled 2015-09-02 (×8): qty 50

## 2015-09-02 MED ORDER — FENTANYL CITRATE (PF) 100 MCG/2ML IJ SOLN
100.0000 ug | Freq: Once | INTRAMUSCULAR | Status: AC
  Administered 2015-09-02: 100 ug via INTRAVENOUS

## 2015-09-02 MED ORDER — NOREPINEPHRINE BITARTRATE 1 MG/ML IV SOLN
0.0000 ug/min | INTRAVENOUS | Status: DC
  Administered 2015-09-02: 3 ug/min via INTRAVENOUS
  Filled 2015-09-02: qty 4

## 2015-09-02 MED ORDER — IPRATROPIUM-ALBUTEROL 0.5-2.5 (3) MG/3ML IN SOLN
3.0000 mL | RESPIRATORY_TRACT | Status: DC | PRN
  Administered 2015-09-02: 3 mL via RESPIRATORY_TRACT

## 2015-09-02 MED ORDER — ANIDULAFUNGIN 100 MG IV SOLR
100.0000 mg | INTRAVENOUS | Status: DC
  Administered 2015-09-03 – 2015-09-06 (×4): 100 mg via INTRAVENOUS
  Filled 2015-09-02 (×5): qty 100

## 2015-09-02 MED ORDER — SODIUM CHLORIDE 0.9 % IV SOLN
200.0000 mg | Freq: Once | INTRAVENOUS | Status: DC
  Administered 2015-09-02: 200 mg via INTRAVENOUS
  Filled 2015-09-02: qty 200

## 2015-09-02 MED ORDER — FENTANYL 2500MCG IN NS 250ML (10MCG/ML) PREMIX INFUSION
0.0000 ug/h | INTRAVENOUS | Status: DC
  Administered 2015-09-02 – 2015-09-03 (×3): 10 ug/h via INTRAVENOUS
  Administered 2015-09-03: 20 ug/h via INTRAVENOUS
  Administered 2015-09-04: 200 ug/h via INTRAVENOUS
  Administered 2015-09-04: 10 ug/h via INTRAVENOUS
  Administered 2015-09-05: 200 ug/h via INTRAVENOUS
  Administered 2015-09-05 – 2015-09-06 (×3): 250 ug/h via INTRAVENOUS
  Administered 2015-09-07: 25 ug/h via INTRAVENOUS
  Administered 2015-09-07: 200 ug/h via INTRAVENOUS
  Administered 2015-09-07: 25 ug/h via INTRAVENOUS
  Administered 2015-09-07: 100 ug/h via INTRAVENOUS
  Administered 2015-09-08: 200 ug/h via INTRAVENOUS
  Filled 2015-09-02 (×13): qty 250

## 2015-09-02 MED ORDER — MIDAZOLAM HCL 2 MG/2ML IJ SOLN
4.0000 mg | Freq: Once | INTRAMUSCULAR | Status: AC
  Administered 2015-09-02: 4 mg via INTRAVENOUS

## 2015-09-02 MED ORDER — VANCOMYCIN HCL 10 G IV SOLR
2000.0000 mg | Freq: Once | INTRAVENOUS | Status: AC
  Administered 2015-09-02: 2000 mg via INTRAVENOUS
  Filled 2015-09-02: qty 2000

## 2015-09-02 MED ORDER — SODIUM CHLORIDE 0.9 % IV SOLN
200.0000 mg | Freq: Once | INTRAVENOUS | Status: AC
  Administered 2015-09-02: 200 mg via INTRAVENOUS
  Filled 2015-09-02 (×2): qty 200

## 2015-09-02 MED ORDER — PROPOFOL 1000 MG/100ML IV EMUL
5.0000 ug/kg/min | INTRAVENOUS | Status: DC
  Administered 2015-09-02: 40 ug/kg/min via INTRAVENOUS
  Administered 2015-09-02: 50 ug/kg/min via INTRAVENOUS
  Administered 2015-09-02: 30 ug/kg/min via INTRAVENOUS
  Administered 2015-09-02 – 2015-09-03 (×6): 40 ug/kg/min via INTRAVENOUS
  Administered 2015-09-03: 40.006 ug/kg/min via INTRAVENOUS
  Administered 2015-09-04 (×5): 45 ug/kg/min via INTRAVENOUS
  Administered 2015-09-04: 44.529 ug/kg/min via INTRAVENOUS
  Administered 2015-09-04: 40 ug/kg/min via INTRAVENOUS
  Administered 2015-09-04 – 2015-09-05 (×5): 45 ug/kg/min via INTRAVENOUS
  Administered 2015-09-05: 43.116 ug/kg/min via INTRAVENOUS
  Administered 2015-09-05 (×2): 45 ug/kg/min via INTRAVENOUS
  Administered 2015-09-06 (×2): 50 ug/kg/min via INTRAVENOUS
  Administered 2015-09-06: 60 ug/kg/min via INTRAVENOUS
  Administered 2015-09-06: 50 ug/kg/min via INTRAVENOUS
  Administered 2015-09-06: 60 ug/kg/min via INTRAVENOUS
  Administered 2015-09-06: 50 ug/kg/min via INTRAVENOUS
  Administered 2015-09-06 – 2015-09-07 (×4): 60 ug/kg/min via INTRAVENOUS
  Administered 2015-09-07: 70 ug/kg/min via INTRAVENOUS
  Administered 2015-09-07: 60 ug/kg/min via INTRAVENOUS
  Administered 2015-09-07: 35 ug/kg/min via INTRAVENOUS
  Filled 2015-09-02 (×40): qty 100

## 2015-09-02 MED ORDER — CHLORHEXIDINE GLUCONATE 0.12% ORAL RINSE (MEDLINE KIT)
15.0000 mL | Freq: Two times a day (BID) | OROMUCOSAL | Status: DC
  Administered 2015-09-02 – 2015-09-05 (×7): 15 mL via OROMUCOSAL
  Filled 2015-09-02 (×10): qty 15

## 2015-09-02 MED ORDER — ANTISEPTIC ORAL RINSE SOLUTION (CORINZ)
7.0000 mL | Freq: Four times a day (QID) | OROMUCOSAL | Status: DC
  Administered 2015-09-02 – 2015-09-06 (×13): 7 mL via OROMUCOSAL
  Filled 2015-09-02 (×18): qty 7

## 2015-09-02 MED ORDER — VANCOMYCIN HCL IN DEXTROSE 1-5 GM/200ML-% IV SOLN
1000.0000 mg | INTRAVENOUS | Status: DC
  Filled 2015-09-02 (×2): qty 200

## 2015-09-02 MED ORDER — VECURONIUM BROMIDE 10 MG IV SOLR
10.0000 mg | Freq: Once | INTRAVENOUS | Status: AC
Start: 2015-09-02 — End: 2015-09-02
  Administered 2015-09-02: 10 mg via INTRAVENOUS

## 2015-09-02 MED ORDER — STERILE WATER FOR INJECTION IJ SOLN
INTRAMUSCULAR | Status: AC
  Administered 2015-09-02: 14:00:00
  Filled 2015-09-02: qty 20

## 2015-09-02 NOTE — Progress Notes (Signed)
PT Cancellation Note  Patient Details Name: Ross [name] MRN: [id number] DOB: 1966-09-14   Cancelled Treatment:    Reason Eval/Treat Not Completed: Medical issues which prohibited therapy (Per chart review, patient noted with transfer to CCU due to respiratory distress; emergently intubated.  Due to decline in status and transfer to higher level of care, will require new orders to resume care as medically appropriate.)   Krupa Stege H. Manson PasseyBrown, PT, DPT, NCS 09/02/2015, 3:33 PM [phone]

## 2015-09-02 NOTE — Progress Notes (Signed)
Pt ST with HR at 129 and RR at 48. Pt also hallucinating, Ativan 1 mg admin but no change. MD made aware, orders placed.

## 2015-09-02 NOTE — Progress Notes (Signed)
Subjective:  Patient worse this AM.  Febrile to 101. Also tachypneic and HR 122.  Having active rigors.  Possible pneumonia on CXR. Case discussed immediately with Drs. Kasa and MaltaKonidena. Seen during HD, we will stop this and transfer to CCU.  Objective:  Vital signs in last 24 hours:  Temp:  [98.7 F (37.1 C)-98.8 F (37.1 C)] 98.8 F (37.1 C) (11/16 1025) Pulse Rate:  [117-129] 122 (11/16 1130) Resp:  [16-42] 42 (11/16 1130) BP: (157-168)/(90-117) 168/102 mmHg (11/16 1130) SpO2:  [86 %-96 %] 90 % (11/16 1130) FiO2 (%):  [55 %] 55 % (11/16 0458) Weight:  [117.9 kg (259 lb 14.8 oz)-117.935 kg (260 lb)] 117.9 kg (259 lb 14.8 oz) (11/16 1015)  Weight change: -5.08 kg (-11 lb 3.2 oz) Filed Weights   09/01/15 0544 09/02/15 0500 09/02/15 1015  Weight: 123.016 kg (271 lb 3.2 oz) 117.935 kg (260 lb) 117.9 kg (259 lb 14.8 oz)    Intake/Output:    Intake/Output Summary (Last 24 hours) at 09/02/15 1152 Last data filed at 09/02/15 0500  Gross per 24 hour  Intake    180 ml  Output   1450 ml  Net  -1270 ml     Physical Exam: General:  critically ill appearing.  HEENT  [place]/ET EOMI hearing intact OM moist  Neck  supple  Pulm/lungs  bilateral rhonchi, increased work of breathing.  CVS/Heart  S1S2 tachycardic  Abdomen:   soft, +bowel sounds, nontender  Extremities:  trace b/l LE edema  Neurologic:  awake, but confused  Skin:  no acute rashes  Access: Right femoral non tunneled catheter Dr. Belia HemanKasa 11/6       Basic Metabolic Panel:   Recent Labs Lab 08/26/15 1819 08/27/15 0452  08/29/15 0530 08/30/15 0643 08/31/15 0622 09/01/15 0541 09/02/15 0507  NA  --  145  < > 149* 144 146* 144 146*  K 3.6 3.4*  < > 3.0* 3.2* 3.8 4.0 4.2  CL  --  112*  < > 120* 119* 120* 119* 118*  CO2  --  23  < > 20* 18* 21* 19* 17*  GLUCOSE  --  133*  < > 121* 119* 109* 89 100*  BUN  --  52*  < > 83* 86* 83* 76* 77*  CREATININE  --  4.52*  < > 4.75* 4.93* 4.85* 4.78* 4.91*  CALCIUM  --   8.3*  < > 8.6* 9.1 8.5* 8.6* 8.6*  MG 2.0  --   --   --   --   --   --   --   PHOS  --  2.8  --   --   --   --   --   --   < > = values in this interval not displayed.   CBC:  Recent Labs Lab 08/27/15 0452 08/28/15 0500 08/28/15 1632 09/01/15 2131  WBC 15.8* 17.4* 21.5* 18.5*  HGB 12.2* 12.3* 13.1 11.2*  HCT 37.5* 37.7* 38.9* 34.2*  MCV 88.7 88.5 87.7 87.8  PLT 120* 145* 167 173      Microbiology:  Recent Results (from the past 720 hour(s))  Blood Culture (routine x 2)     Status: None   Collection Time: 08/21/15 12:47 PM  Result Value Ref Range Status   Specimen Description BLOOD UNKNOWN  Final   Special Requests BOTTLES DRAWN AEROBIC AND ANAEROBIC  1CC  Final   Culture NO GROWTH 10 DAYS  Final   Report Status 08/31/2015 FINAL  Final  Blood Culture (routine x 2)     Status: None   Collection Time: 08/21/15 12:47 PM  Result Value Ref Range Status   Specimen Description BLOOD LEFT AC  Final   Special Requests BOTTLES DRAWN AEROBIC AND ANAEROBIC  6CC  Final   Culture NO GROWTH 10 DAYS  Final   Report Status 08/31/2015 FINAL  Final  Urine culture     Status: None   Collection Time: 08/21/15 12:47 PM  Result Value Ref Range Status   Specimen Description URINE, CATHETERIZED  Final   Special Requests Normal  Final   Culture NO GROWTH 2 DAYS  Final   Report Status 08/23/2015 FINAL  Final  MRSA PCR Screening     Status: None   Collection Time: 08/21/15 11:47 PM  Result Value Ref Range Status   MRSA by PCR NEGATIVE NEGATIVE Final    Comment:        The GeneXpert MRSA Assay (FDA approved for NASAL specimens only), is one component of a comprehensive MRSA colonization surveillance program. It is not intended to diagnose MRSA infection nor to guide or monitor treatment for MRSA infections.   C difficile quick scan w PCR reflex     Status: Abnormal   Collection Time: 08/22/15  2:31 PM  Result Value Ref Range Status   C Diff antigen POSITIVE (A) NEGATIVE Final   C  Diff toxin POSITIVE (A) NEGATIVE Final   C Diff interpretation   Final    Positive for toxigenic C. difficile, active toxin production present.    Comment: CRITICAL RESULT CALLED TO, READ BACK BY AND VERIFIED WITH: CHARLIE FLEETWOOD,RN 08/22/2015 1544 BY J RAZZAKSUAREZ,MT   Culture, bal-quantitative     Status: None   Collection Time: 08/25/15  3:15 PM  Result Value Ref Range Status   Specimen Description BRONCHIAL ALVEOLAR LAVAGE  Final   Special Requests NONE  Final   Gram Stain   Final    GOOD SPECIMEN - 80-90% WBCS FEW WBC SEEN NO ORGANISMS SEEN    Culture HOLDING FOR POSSIBLE PATHOGEN  Final   Report Status 08/27/2015 FINAL  Final    Coagulation Studies: No results for input(s): LABPROT, INR in the last 72 hours.  Urinalysis: No results for input(s): COLORURINE, LABSPEC, PHURINE, GLUCOSEU, HGBUR, BILIRUBINUR, KETONESUR, PROTEINUR, UROBILINOGEN, NITRITE, LEUKOCYTESUR in the last 72 hours.  Invalid input(s): APPERANCEUR    Imaging: Dg Chest 1 View  09/02/2015  CLINICAL DATA:  Shortness of breath tonight. EXAM: CHEST 1 VIEW COMPARISON:  Most recent radiograph yesterday at 0301 hour. Additional prior exams reviewed. FINDINGS: Tip of the right central line in the distal SVC. Increasing hazy opacity in the left lung consistent pleural effusion. This is progressed from prior exam. Progressive left airspace opacity. Minimal vascular congestion the right lung with suprahilar atelectasis. Cardiomediastinal contours are unchanged, partially obscured by pleural effusion. IMPRESSION: Progressive left pleural effusion and left-sided airspace opacity, pulmonary edema, atelectasis or pneumonia. Vascular congestion in the right lung again seen. Electronically Signed   By: Rubye Oaks M.D.   On: 09/02/2015 00:42   Dg Chest 1 View  09/01/2015  CLINICAL DATA:  Acute onset of sternal chest pain. Found unresponsive, with seizure like activity. Initial encounter. EXAM: CHEST 1 VIEW COMPARISON:   Chest radiograph performed 08/26/2015 FINDINGS: There is an increasing moderate left-sided pleural effusion, with left-sided airspace opacification, which may reflect asymmetric pulmonary edema or pneumonia. Underlying vascular congestion is noted. The right lung is otherwise clear. No pneumothorax is seen. The  cardiomediastinal silhouette is borderline normal in size. The right IJ line is noted ending about the distal SVC. No acute osseous abnormalities are seen. IMPRESSION: Increasing moderate left-sided pleural effusion, with left-sided airspace opacification, which may reflect asymmetric pulmonary edema or pneumonia. Underlying vascular congestion noted. Electronically Signed   By: Roanna Raider M.D.   On: 09/01/2015 03:39     Medications:     . feeding supplement (ENSURE ENLIVE)  237 mL Oral BID WC  . heparin subcutaneous  5,000 Units Subcutaneous 3 times per day  . insulin aspart  0-5 Units Subcutaneous QHS  . insulin aspart  0-9 Units Subcutaneous TID WC  . piperacillin-tazobactam (ZOSYN)  IV  3.375 g Intravenous Q12H  . vancomycin  125 mg Oral 4 times per day   acetaminophen **OR** acetaminophen, hydrALAZINE, Influenza vac split quadrivalent PF, ipratropium-albuterol, ondansetron (ZOFRAN) IV, oxyCODONE, pneumococcal 23 valent vaccine, traZODone  Assessment/ Plan:  49 y.o. male With alcohol abuse, schizophrenia, seizure disorder, coronary disease/MI 2012 was admitted to [facility] on November 4 after being found unresponsive in his apartment. Urine toxicology screen is positive for benzodiazepines and cannabinoids. Stool positive for C. Difficile  1. Acute renal failure, likely secondary to ATN from concurrent illness, volume depletion, C. difficile, rhabdomyolysis and sepsis. Has had 3 HD treatments. -Pt seen during HD today but worse.  Having active rigors at the moment with tachypnea, tachycardia, and high temp.  Could be septic.  Discussed with Dr. Luberta Mutter.  Will stop dialysis.  Proceed  with transfer to CCU.  Blood cultures x 2 sets taken during HD.   2. Acute rhabdomyolysis M62.82: was likely leading to his ARF, has improved.  3.  Hypernatremia. Sodium higher today at 146, will monitor.  4.  Hypokalemia. Resolved..  5. Infective Colitis A09: c. Diff positive.  - PO vancomycin   6.  Fever, tachycardia, rigors:  Suspect sepsis, blood cultures x 2 sets sent during HD, antibiotic coverage to be broadened, consulted with Dr. Belia Heman as well, will transfer back to CCU given respiratory distress as well.    LOS: 12 Ross [name] 11/16/201611:52 AM

## 2015-09-02 NOTE — Care Management Note (Addendum)
Case Management Note  Patient Details  Name: Ross [name] MRN: [id number] Date of Birth: 1966/01/04  Subjective/Objective:  [facility] can accept patient. [place] discussed case with Dr. Belia HemanKasa. He wants to watch patient for 24 hour and then proceed it hospitalist agrees. TC to patient's mother, Ross [name] [phone]). Discussed LTACH at [facility]. She is agreeable to transfer when patient is ready. RNCM will need to call her when patient is to transfer.                  Action/Plan:   Expected Discharge Date:                  Expected Discharge Plan:     In-House Referral:     Discharge planning Services     Post Acute Care Choice:    Choice offered to:     DME Arranged:    DME Agency:     HH Arranged:    HH Agency:     Status of Service:     Medicare Important Message Given:  Yes Date Medicare IM Given:    Medicare IM give by:    Date Additional Medicare IM Given:    Additional Medicare Important Message give by:     If discussed at Long Length of Stay Meetings, dates discussed:    Additional Comments:  Marily MemosLisa M Mylia Pondexter, RN 09/02/2015, 2:51 PM

## 2015-09-02 NOTE — Consult Note (Signed)
ANTIBIOTIC CONSULT NOTE - INITIAL  Pharmacy Consult for Vancomycin Indication: Sepsis  Allergies  Allergen Reactions  . Antihistamines, Diphenhydramine-Type Anaphylaxis and Rash  . Benadryl [Diphenhydramine Hcl] Anaphylaxis and Rash  . Hydroxyzine Anaphylaxis and Rash    Patient Measurements: Height:  (180.3 cm) Weight: 259 lb 14.8 oz (117.9 kg) IBW/kg (Calculated) : 75.3 Adjusted Body Weight:   Vital Signs: Temp: 101.7 F (38.7 C) (11/16 1200) Temp Source: Axillary (11/16 1200) BP: 151/100 mmHg (11/16 1200) Pulse Rate: 124 (11/16 1200) Intake/Output from previous day: 11/15 0701 - 11/16 0700 In: 180 [P.O.:180] Out: 2550 [Urine:2550] Intake/Output from this shift: Total I/O In: 230 [I.V.:230] Out: 1145 [Urine:600; Other:545]  Labs:  Recent Labs  08/31/15 0622 09/01/15 0541 09/01/15 2131 09/02/15 0507  WBC  --   --  18.5*  --   HGB  --   --  11.2*  --   PLT  --   --  173  --   CREATININE 4.85* 4.78*  --  4.91*   Estimated Creatinine Clearance: 24.3 mL/min (by C-G formula based on Cr of 4.91). No results for input(s): VANCOTROUGH, VANCOPEAK, VANCORANDOM, GENTTROUGH, GENTPEAK, GENTRANDOM, TOBRATROUGH, TOBRAPEAK, TOBRARND, AMIKACINPEAK, AMIKACINTROU, AMIKACIN in the last 72 hours.   Microbiology: Recent Results (from the past 720 hour(s))  Blood Culture (routine x 2)     Status: None   Collection Time: 08/21/15 12:47 PM  Result Value Ref Range Status   Specimen Description BLOOD UNKNOWN  Final   Special Requests BOTTLES DRAWN AEROBIC AND ANAEROBIC  1CC  Final   Culture NO GROWTH 10 DAYS  Final   Report Status 08/31/2015 FINAL  Final  Blood Culture (routine x 2)     Status: None   Collection Time: 08/21/15 12:47 PM  Result Value Ref Range Status   Specimen Description BLOOD LEFT AC  Final   Special Requests BOTTLES DRAWN AEROBIC AND ANAEROBIC  6CC  Final   Culture NO GROWTH 10 DAYS  Final   Report Status 08/31/2015 FINAL  Final  Urine culture      Status: None   Collection Time: 08/21/15 12:47 PM  Result Value Ref Range Status   Specimen Description URINE, CATHETERIZED  Final   Special Requests Normal  Final   Culture NO GROWTH 2 DAYS  Final   Report Status 08/23/2015 FINAL  Final  MRSA PCR Screening     Status: None   Collection Time: 08/21/15 11:47 PM  Result Value Ref Range Status   MRSA by PCR NEGATIVE NEGATIVE Final    Comment:        The GeneXpert MRSA Assay (FDA approved for NASAL specimens only), is one component of a comprehensive MRSA colonization surveillance program. It is not intended to diagnose MRSA infection nor to guide or monitor treatment for MRSA infections.   C difficile quick scan w PCR reflex     Status: Abnormal   Collection Time: 08/22/15  2:31 PM  Result Value Ref Range Status   C Diff antigen POSITIVE (A) NEGATIVE Final   C Diff toxin POSITIVE (A) NEGATIVE Final   C Diff interpretation   Final    Positive for toxigenic C. difficile, active toxin production present.    Comment: CRITICAL RESULT CALLED TO, READ BACK BY AND VERIFIED WITH: CHARLIE FLEETWOOD,RN 08/22/2015 1544 BY J RAZZAKSUAREZ,MT   Culture, bal-quantitative     Status: None   Collection Time: 08/25/15  3:15 PM  Result Value Ref Range Status   Specimen Description BRONCHIAL ALVEOLAR LAVAGE  Final   Special Requests NONE  Final   Gram Stain   Final    GOOD SPECIMEN - 80-90% WBCS FEW WBC SEEN NO ORGANISMS SEEN    Culture HOLDING FOR POSSIBLE PATHOGEN  Final   Report Status 08/27/2015 FINAL  Final    Medical History: Past Medical History  Diagnosis Date  . Sleep apnea     had surgery to correct it  . Mental disorder     PTSD  . Seizures (HCC)     currently dx with seizures  . Cardiac arrest (HCC) 08/23/2011  . Pneumonia 09/04/2011  . Pancreatitis   . SOB (shortness of breath) 09/11/2011  . Hypertension   . Anxiety   . Myocardial infarction (HCC)   . Schizophrenia (HCC)   . Rhabdomyolysis 08/23/2015  . Acute renal  failure (HCC) 08/23/2015    Medications:  Scheduled:  . heparin subcutaneous  5,000 Units Subcutaneous 3 times per day  . insulin aspart  0-5 Units Subcutaneous QHS  . insulin aspart  0-9 Units Subcutaneous TID WC  . ipratropium-albuterol  3 mL Nebulization Q4H  . pantoprazole  40 mg Oral Q1200  . piperacillin-tazobactam (ZOSYN)  IV  3.375 g Intravenous Q12H  . vancomycin  2,000 mg Intravenous Once  . vancomycin  125 mg Oral 4 times per day  . [START ON 09/04/2015] vancomycin  1,000 mg Intravenous Q M,W,F-HD   Assessment: Pt is a 49 year old male with hypoxia, worsening renal failure requiring HD, sepsis, rhabdo, c.diff, seizures, treated with vanc (11/4-11/10) and zosyn 11/4-11/6 and po vanc 11/5 to current. Chest x-ray shows this AM possible PNA. Zosyn added. Now adding vancomycin to broaden coverage.   Goal of Therapy:  Vancomycin pre-HD level: 15-25 mcg/ml  Plan:  Will load vancomycin 2000 mg iv in this dialysis patient. Will order vancomycin 1000 mg iv qHD. Will need to f/u HD schedule as patient did not tolerated HD today and may require dialysis outside of his regular MWF schedule. Will tentatively order a vancomycin level with the expected third dialysis session on 11/23.   Luisa Harthristy, Keia Rask D 09/02/2015,2:18 PM

## 2015-09-02 NOTE — Consult Note (Signed)
ANTIBIOTIC CONSULT NOTE - INITIAL  Pharmacy Consult for zosyn Indication: aspiration pna  Allergies  Allergen Reactions  . Antihistamines, Diphenhydramine-Type Anaphylaxis and Rash  . Benadryl [Diphenhydramine Hcl] Anaphylaxis and Rash  . Hydroxyzine Anaphylaxis and Rash    Patient Measurements: Height:  (180.3 cm) Weight: 260 lb (117.935 kg) IBW/kg (Calculated) : 75.3 Adjusted Body Weight:   Vital Signs: Temp: 98.7 F (37.1 C) (11/16 0436) BP: 168/103 mmHg (11/16 0421) Pulse Rate: 129 (11/16 0421) Intake/Output from previous day: 11/15 0701 - 11/16 0700 In: 180 [P.O.:180] Out: 2550 [Urine:2550] Intake/Output from this shift:    Labs:  Recent Labs  08/31/15 0622 09/01/15 0541 09/01/15 2131 09/02/15 0507  WBC  --   --  18.5*  --   HGB  --   --  11.2*  --   PLT  --   --  173  --   CREATININE 4.85* 4.78*  --  4.91*   Estimated Creatinine Clearance: 24.3 mL/min (by C-G formula based on Cr of 4.91). No results for input(s): VANCOTROUGH, VANCOPEAK, VANCORANDOM, GENTTROUGH, GENTPEAK, GENTRANDOM, TOBRATROUGH, TOBRAPEAK, TOBRARND, AMIKACINPEAK, AMIKACINTROU, AMIKACIN in the last 72 hours.   Microbiology: Recent Results (from the past 720 hour(s))  Blood Culture (routine x 2)     Status: None   Collection Time: 08/21/15 12:47 PM  Result Value Ref Range Status   Specimen Description BLOOD UNKNOWN  Final   Special Requests BOTTLES DRAWN AEROBIC AND ANAEROBIC  1CC  Final   Culture NO GROWTH 10 DAYS  Final   Report Status 08/31/2015 FINAL  Final  Blood Culture (routine x 2)     Status: None   Collection Time: 08/21/15 12:47 PM  Result Value Ref Range Status   Specimen Description BLOOD LEFT AC  Final   Special Requests BOTTLES DRAWN AEROBIC AND ANAEROBIC  6CC  Final   Culture NO GROWTH 10 DAYS  Final   Report Status 08/31/2015 FINAL  Final  Urine culture     Status: None   Collection Time: 08/21/15 12:47 PM  Result Value Ref Range Status   Specimen Description  URINE, CATHETERIZED  Final   Special Requests Normal  Final   Culture NO GROWTH 2 DAYS  Final   Report Status 08/23/2015 FINAL  Final  MRSA PCR Screening     Status: None   Collection Time: 08/21/15 11:47 PM  Result Value Ref Range Status   MRSA by PCR NEGATIVE NEGATIVE Final    Comment:        The GeneXpert MRSA Assay (FDA approved for NASAL specimens only), is one component of a comprehensive MRSA colonization surveillance program. It is not intended to diagnose MRSA infection nor to guide or monitor treatment for MRSA infections.   C difficile quick scan w PCR reflex     Status: Abnormal   Collection Time: 08/22/15  2:31 PM  Result Value Ref Range Status   C Diff antigen POSITIVE (A) NEGATIVE Final   C Diff toxin POSITIVE (A) NEGATIVE Final   C Diff interpretation   Final    Positive for toxigenic C. difficile, active toxin production present.    Comment: CRITICAL RESULT CALLED TO, READ BACK BY AND VERIFIED WITH: CHARLIE FLEETWOOD,RN 08/22/2015 1544 BY J RAZZAKSUAREZ,MT   Culture, bal-quantitative     Status: None   Collection Time: 08/25/15  3:15 PM  Result Value Ref Range Status   Specimen Description BRONCHIAL ALVEOLAR LAVAGE  Final   Special Requests NONE  Final   Gram Stain  Final    GOOD SPECIMEN - 80-90% WBCS FEW WBC SEEN NO ORGANISMS SEEN    Culture HOLDING FOR POSSIBLE PATHOGEN  Final   Report Status 08/27/2015 FINAL  Final    Medical History: Past Medical History  Diagnosis Date  . Sleep apnea     had surgery to correct it  . Mental disorder     PTSD  . Seizures (HCC)     currently dx with seizures  . Cardiac arrest (HCC) 08/23/2011  . Pneumonia 09/04/2011  . Pancreatitis   . SOB (shortness of breath) 09/11/2011  . Hypertension   . Anxiety   . Myocardial infarction (HCC)   . Schizophrenia (HCC)   . Rhabdomyolysis 08/23/2015  . Acute renal failure (HCC) 08/23/2015    Medications:  Scheduled:  . feeding supplement (ENSURE ENLIVE)  237 mL Oral  BID WC  . heparin subcutaneous  5,000 Units Subcutaneous 3 times per day  . insulin aspart  0-5 Units Subcutaneous QHS  . insulin aspart  0-9 Units Subcutaneous TID WC  . vancomycin  125 mg Oral 4 times per day   Assessment: Pt is a 49 year old male with hypoxia, worsening renal failure, sepsis, rhabdo, c.diff, seizures, treated with vanc (11/4-11/10) and zosyn 11/4-11/6 and po vanc 11/5 to current. Chest x-ray shows this AM possible PNA. Pharamacy consulted to dose zosyn.  Goal of Therapy:  resolution of infection  Plan:  Measure antibiotic drug levels at steady state Follow up culture results Since pt is to recive HD will give 3.375g q 12 hours EI dosing. Pharmacy will continue to monitor renal function/need for diaylsis for any dose adjustments  Nera Haworth D Kataleia Quaranta 09/02/2015,10:14 AM

## 2015-09-02 NOTE — Progress Notes (Signed)
Patient intubated for airway protection by Dr. Belia HemanKasa.  Patient was in respiratory distress while in dialysis- temp of 101.7- tachycardic- now patient sedated with propofol and fentanyl drip- levophed started due to BP in 60's systolic.  Patient's mother Ross [name] was called and updated on patient's condition at this time- she stated she will come visit tomorrow.

## 2015-09-02 NOTE — Care Management Important Message (Signed)
Important Message  Patient Details  Name: Ross [name] MRN: [id number] Date of Birth: Nov 17, 1965   Medicare Important Message Given:  Yes    [name], Aundra Espin L, RN 09/02/2015, 10:38 AM

## 2015-09-02 NOTE — Consult Note (Signed)
[facility] [place] Pulmonary Medicine Consultation      Name: Ross [name] MRN: [id number] DOB: 1966-10-14    ADMISSION DATE:  08/21/2015   CHIEF COMPLAINT:    acute resp failure   SUBJECTIVE   Patient transferred back to ICU for acute hypoxic resp failure with increased WOB and using accessory muscles to breathe' Patient emergently intubated,placed on full vent support  SIGNIFICANT EVENTS   11/4 intubated 11/5 failed vent wean due to encephalopathy and acidosis 11/6 RIJ CVL, R Fem Vein VasCath 11/8 bronchoscopy with BAL 11/9 self extubated 11/15 transferred back to ICU 11/15 intubated  Review of Systems  Unable to perform ROS: critical illness      VITAL SIGNS    Temp:  [98.7 F (37.1 C)-101.7 F (38.7 C)] 101.7 F (38.7 C) (11/16 1200) Pulse Rate:  [117-129] 124 (11/16 1200) Resp:  [16-43] 43 (11/16 1200) BP: (151-168)/(90-117) 151/100 mmHg (11/16 1200) SpO2:  [86 %-96 %] 90 % (11/16 1200) FiO2 (%):  [55 %] 55 % (11/16 0458) Weight:  [259 lb 14.8 oz (117.9 kg)-260 lb (117.935 kg)] 259 lb 14.8 oz (117.9 kg) (11/16 1015) HEMODYNAMICS:   VENTILATOR SETTINGS: Vent Mode:  [-]  FiO2 (%):  [55 %] 55 % INTAKE / OUTPUT:  Intake/Output Summary (Last 24 hours) at 09/02/15 1301 Last data filed at 09/02/15 1200  Gross per 24 hour  Intake    180 ml  Output   1895 ml  Net  -1715 ml       PHYSICAL EXAM   Physical Exam  Constitutional: He appears distressed.  HENT:  Head: Normocephalic and atraumatic.  Eyes: Pupils are equal, round, and reactive to light. No scleral icterus.  Neck: Normal range of motion. Neck supple.  Cardiovascular: Normal rate and regular rhythm.   No murmur heard. Pulmonary/Chest: He is in respiratory distress. He has wheezes. He has rales.  Extubated, still with coarse BS, but improving  Abdominal: Soft.  Musculoskeletal: He exhibits no edema.  Neurological: He is alert.  [id number]  Skin: Skin is warm. He is diaphoretic.        LABS   LABS:  CBC  Recent Labs Lab 08/28/15 0500 08/28/15 1632 09/01/15 2131  WBC 17.4* 21.5* 18.5*  HGB 12.3* 13.1 11.2*  HCT 37.7* 38.9* 34.2*  PLT 145* 167 173   Coag's No results for input(s): APTT, INR in the last 168 hours. BMET  Recent Labs Lab 08/31/15 0622 09/01/15 0541 09/02/15 0507  NA 146* 144 146*  K 3.8 4.0 4.2  CL 120* 119* 118*  CO2 21* 19* 17*  BUN 83* 76* 77*  CREATININE 4.85* 4.78* 4.91*  GLUCOSE 109* 89 100*   Electrolytes  Recent Labs Lab 08/26/15 1819 08/27/15 0452  08/31/15 0622 09/01/15 0541 09/02/15 0507  CALCIUM  --  8.3*  < > 8.5* 8.6* 8.6*  MG 2.0  --   --   --   --   --   PHOS  --  2.8  --   --   --   --   < > = values in this interval not displayed. Sepsis Markers No results for input(s): LATICACIDVEN, PROCALCITON, O2SATVEN in the last 168 hours. ABG No results for input(s): PHART, PCO2ART, PO2ART in the last 168 hours. Liver Enzymes  Recent Labs Lab 08/27/15 0452 08/28/15 0500  AST 693* 474*  ALT 320* 270*  ALKPHOS 375* 361*  BILITOT 1.1 1.0  ALBUMIN 2.1*  2.1* 2.0*   Cardiac Enzymes No results for input(s):  TROPONINI, PROBNP in the last 168 hours. Glucose  Recent Labs Lab 09/01/15 0041 09/01/15 0758 09/01/15 1121 09/01/15 1645 09/01/15 2120 09/02/15 0731  GLUCAP 93 87 85 86 84 89     Recent Results (from the past 240 hour(s))  Culture, bal-quantitative     Status: None   Collection Time: 08/25/15  3:15 PM  Result Value Ref Range Status   Specimen Description BRONCHIAL ALVEOLAR LAVAGE  Final   Special Requests NONE  Final   Gram Stain   Final    GOOD SPECIMEN - 80-90% WBCS FEW WBC SEEN NO ORGANISMS SEEN    Culture HOLDING FOR POSSIBLE PATHOGEN  Final   Report Status 08/27/2015 FINAL  Final     Current facility-administered medications:  .  acetaminophen (TYLENOL) tablet 650 mg, 650 mg, Oral, Q6H PRN, 650 mg at 09/02/15 1231 **OR** acetaminophen (TYLENOL) suppository 650 mg, 650  mg, Rectal, Q6H PRN, Vishal Mungal, MD .  feeding supplement (ENSURE ENLIVE) (ENSURE ENLIVE) liquid 237 mL, 237 mL, Oral, BID WC, Gale Journey, MD, 237 mL at 09/01/15 0834 .  fentaNYL (SUBLIMAZE) injection 100 mcg, 100 mcg, Intravenous, Once, Erin Fulling, MD .  fentaNYL in NS (70mcg/ml) infusion-PREMIX, 10 mcg/hr, Intravenous, Continuous, Erin Fulling, MD .  heparin injection 5,000 Units, 5,000 Units, Subcutaneous, 3 times per day, Katha Hamming, MD, 5,000 Units at 09/02/15 0514 .  hydrALAZINE (APRESOLINE) injection 10 mg, 10 mg, Intravenous, Q4H PRN, Oralia Manis, MD .  Influenza vac split quadrivalent PF (FLUARIX) injection 0.5 mL, 0.5 mL, Intramuscular, Prior to discharge, Katha Hamming, MD .  insulin aspart (novoLOG) injection 0-5 Units, 0-5 Units, Subcutaneous, QHS, Gale Journey, MD, 1 Units at 08/29/15 1834 .  insulin aspart (novoLOG) injection 0-9 Units, 0-9 Units, Subcutaneous, TID WC, Gale Journey, MD, 1 Units at 08/30/15 1216 .  ipratropium-albuterol (DUONEB) 0.5-2.5 (3) MG/3ML nebulizer solution 3 mL, 3 mL, Nebulization, Q4H PRN, Oralia Manis, MD, 3 mL at 09/02/15 0128 .  midazolam (VERSED) injection 4 mg, 4 mg, Intravenous, Once, Erin Fulling, MD .  ondansetron (ZOFRAN) injection 4 mg, 4 mg, Intravenous, Q8H PRN, Vishal Mungal, MD, 4 mg at 08/28/15 1052 .  oxyCODONE (Oxy IR/ROXICODONE) immediate release tablet 5 mg, 5 mg, Oral, Q6H PRN, Oralia Manis, MD, 5 mg at 09/01/15 2125 .  piperacillin-tazobactam (ZOSYN) IVPB 3.375 g, 3.375 g, Intravenous, Q12H, Melissa D Maccia, RPH .  pneumococcal 23 valent vaccine (PNU-IMMUNE) injection 0.5 mL, 0.5 mL, Intramuscular, Prior to discharge, Katha Hamming, MD .  propofol (DIPRIVAN) 1000 MG/100ML infusion, 5-80 mcg/kg/min, Intravenous, Titrated, Erin Fulling, MD .  sterile water (preservative free) injection, , , ,  .  traZODone (DESYREL) tablet 25 mg, 25 mg, Oral, QHS PRN, Oralia Manis, MD, 25 mg at  09/02/15 0047 .  vancomycin (VANCOCIN) 50 mg/mL oral solution 125 mg, 125 mg, Oral, 4 times per day, Stephanie Acre, MD, 125 mg at 09/02/15 0514 .  vecuronium (NORCURON) injection 10 mg, 10 mg, Intravenous, Once, Erin Fulling, MD  IMAGING    Dg Chest 1 View  09/02/2015  CLINICAL DATA:  Shortness of breath tonight. EXAM: CHEST 1 VIEW COMPARISON:  Most recent radiograph yesterday at 0301 hour. Additional prior exams reviewed. FINDINGS: Tip of the right central line in the distal SVC. Increasing hazy opacity in the left lung consistent pleural effusion. This is progressed from prior exam. Progressive left airspace opacity. Minimal vascular congestion the right lung with suprahilar atelectasis. Cardiomediastinal contours are unchanged, partially obscured by pleural effusion.  IMPRESSION: Progressive left pleural effusion and left-sided airspace opacity, pulmonary edema, atelectasis or pneumonia. Vascular congestion in the right lung again seen. Electronically Signed   By: Rubye OaksMelanie  Ehinger M.D.   On: 09/02/2015 00:42      Indwelling Urinary Catheter continued, requirement due to   Reason to continue Indwelling Urinary Catheter for strict Intake/Output monitoring for hemodynamic instability         Ventilator continued, requirement due to, resp failure    Ventilator Sedation RASS 0 to -2   MAJOR EVENTS/TEST RESULTS: 11/4 intubated 11/5 failed vent wean 11/8 bronchoscopy 11/9 self extubated  INDWELLING DEVICES:: 11/6 RIJ CVL, R Fem Vein VasCath>>  MICRO DATA: MRSA PCR >>neg Urine 11/4>>negative Blood x 2 11/4>>No growth to date Resp BAL 11/8>>neg FLU H1N1>> NEG c diff + (antigend and toxin) 11/5  ANTIMICROBIALS:  Oral vanc>>11/5 Flagy>>11/5    ASSESSMENT/PLAN   49 yo AAM with acute resp failure with acute encephalopathy from acute sepsis from acute recurrent c diff colitis With acute renal failure from rhabdomyolysis with acute drug abuse with etoh abuse  Transferred back  to ICU for acute resp failure from sepsis and pneumonia left sided  PULMONARY- 1.Respiratory Failure -continue Full MV support -continue Bronchodilator Therapy -Wean Fio2 and PEEP as tolerated Follow up ABg and CXR as needed -will obtain sputum culture  CARDIOVASCULAR -blood pressure stable Vasopressors if needed   RENAL Rhabdomyolysis - ck's trending down Acute renal failure from ATN -currently on intermittent HD -appreciate nephro input  GASTROINTESTINAL GI prophylaxis  HEMATOLOGIC Follow CBC  INFECTIOUS + cdiff and HCAP - vancomycin and flagyl -pneumonia-start zosyn -obtain blood cultures and sputum culture-will consider Bronch for BAL  ENDOCRINE - ICU hypoglycemic\Hyperglycemia protocol   NEUROLOGIC - intubated and sedated - minimal sedation to achieve a RASS goal: -1  I have personally obtained a history, examined the patient, evaluated Pertinent laboratory and RadioGraphic/imaging results, and  formulated the assessment and plan The Patient requires high complexity decision making for assessment and support, frequent evaluation and titration of therapies, application of advanced monitoring technologies and extensive interpretation of multiple databases. Critical Care Time devoted to patient care services described in this note is 50 minutes.   Overall, patient is critically ill, prognosis is guarded.  Patient with Multiorgan failure and at high risk for cardiac arrest and death.    Lucie LeatherKurian David Adrik Khim, M.D.  [facility]  Medical Director [facility] Medical Director [facility]

## 2015-09-02 NOTE — Progress Notes (Signed)
Called for svn for this patient. Patient with hr 128 [id number] bilateral breath sounds course. No improvement post svn treatment. Condition unchanged.

## 2015-09-02 NOTE — Progress Notes (Signed)
Placed patient on 55% venturi mask. o2 saturation increased to 95-96%. o2 saturation on nasal cannula noted in 80's. Patient's condition unchanged from previous note. Remains tachycardic and tachypneic.

## 2015-09-02 NOTE — Consult Note (Signed)
[facility] Infectious Disease     Reason for Consult: Fever, sepsis   Referring Physician: Governor Specking Date of Admission:  08/21/2015   Active Problems:   Sepsis ([place])   Acute respiratory failure ([place])   Enteritis due to Clostridium difficile   Rhabdomyolysis   Acute renal failure ([place])   Acute respiratory failure with hypoxia ([place])   Altered mental status   HPI: Ross [name] is a 49 y.o. male with x ETOH use, schizophrenia, sx disorder CAD admitted 11/4 after being found unresponsive. Was in resp and renal failure, with rhabdo and CK 38K.  He had complicated hospital course with intubation and required repeat dialysis.  He tested + for C diff.  He had improved however and was transferred to floor. Today at HD he had fever, and increased resp distress. Was transferred to ICU and intubated.  He has a groing HD cath in place and a neck line as well.    ABX  Recent Vanco po from 11/6 IV zosyn 11/4 -11/5 Meropenem 11/8-11/9 IV vanco 11/5-9  Past Medical History  Diagnosis Date  . Sleep apnea     had surgery to correct it  . Mental disorder     PTSD  . Seizures ([place])     currently dx with seizures  . Cardiac arrest ([place]) 08/23/2011  . Pneumonia 09/04/2011  . Pancreatitis   . SOB (shortness of breath) 09/11/2011  . Hypertension   . Anxiety   . Myocardial infarction ([place])   . Schizophrenia ([place])   . Rhabdomyolysis 08/23/2015  . Acute renal failure ([place]) 08/23/2015   Past Surgical History  Procedure Laterality Date  . Tonsillectomy    . Uvulopalatopharyngoplasty, tonsillectomy and septoplasty  06/16/2000  . Left heart catheterization with coronary angiogram N/A 08/24/2011    Procedure: LEFT HEART CATHETERIZATION WITH CORONARY ANGIOGRAM;  Surgeon: Jettie Booze, MD;  Location: [facility] CATH LAB;  Service: Cardiovascular;  Laterality: N/A;  . Intubation  08/26/2015        Social History  Substance Use Topics  . Smoking status: Former Smoker    Types: Cigarettes  .  Smokeless tobacco: Never Used  . Alcohol Use: Yes     Comment: heavy, has a "drinking problem" per mother   Family History  Problem Relation Age of Onset  . Heart attack Mother     Allergies:  Allergies  Allergen Reactions  . Antihistamines, Diphenhydramine-Type Anaphylaxis and Rash  . Benadryl [Diphenhydramine Hcl] Anaphylaxis and Rash  . Hydroxyzine Anaphylaxis and Rash    Current antibiotics: Antibiotics Given (last 72 hours)    Date/Time Action Medication Dose Rate   08/30/15 1734 Given   vancomycin (VANCOCIN) 50 mg/mL oral solution 125 mg 125 mg    08/31/15 0058 Given   vancomycin (VANCOCIN) 50 mg/mL oral solution 125 mg 125 mg    08/31/15 0648 Given   vancomycin (VANCOCIN) 50 mg/mL oral solution 125 mg 125 mg    08/31/15 1243 Given   vancomycin (VANCOCIN) 50 mg/mL oral solution 125 mg 125 mg    08/31/15 1703 Given   vancomycin (VANCOCIN) 50 mg/mL oral solution 125 mg 125 mg    09/01/15 0047 Given   vancomycin (VANCOCIN) 50 mg/mL oral solution 125 mg 125 mg    09/01/15 0533 Given   vancomycin (VANCOCIN) 50 mg/mL oral solution 125 mg 125 mg    09/01/15 1311 Given   vancomycin (VANCOCIN) 50 mg/mL oral solution 125 mg 125 mg    09/01/15 1819 Given  vancomycin (VANCOCIN) 50 mg/mL oral solution 125 mg 125 mg    09/01/15 2324 Given   vancomycin (VANCOCIN) 50 mg/mL oral solution 125 mg 125 mg    09/02/15 0514 Given   vancomycin (VANCOCIN) 50 mg/mL oral solution 125 mg 125 mg    09/02/15 1452 Given   piperacillin-tazobactam (ZOSYN) IVPB 3.375 g 3.375 g 12.5 mL/hr      MEDICATIONS: . heparin subcutaneous  5,000 Units Subcutaneous 3 times per day  . insulin aspart  0-5 Units Subcutaneous QHS  . insulin aspart  0-9 Units Subcutaneous TID WC  . ipratropium-albuterol  3 mL Nebulization Q4H  . pantoprazole  40 mg Oral Q1200  . piperacillin-tazobactam (ZOSYN)  IV  3.375 g Intravenous Q12H  . vancomycin  2,000 mg Intravenous Once  . vancomycin  125 mg Oral 4 times per day   . [START ON 09/04/2015] vancomycin  1,000 mg Intravenous Q M,W,F-HD    Review of Systems - 11 systems reviewed and negative per HPI   OBJECTIVE: Temp:  [98.7 F (37.1 C)-101.7 F (38.7 C)] 100.1 F (37.8 C) (11/16 1400) Pulse Rate:  [102-130] 102 (11/16 1600) Resp:  [16-43] 16 (11/16 1600) BP: (62-174)/(45-119) 62/45 mmHg (11/16 1600) SpO2:  [86 %-100 %] 100 % (11/16 1600) FiO2 (%):  [50 %-80 %] 50 % (11/16 1537) Weight:  [117.9 kg (259 lb 14.8 oz)-117.935 kg (260 lb)] 117.9 kg (259 lb 14.8 oz) (11/16 1015) Physical Exam  Constitutional: intubated, sedated, hypotensive HENT: anicteric Mouth/Throat: ett in place Cardiovascular: tachy Pulmonary/Chest: mech breath sounds Abdominal: Soft. Bowel sounds are normal. He exhibits no distension. There is no tenderness.  Lymphadenopathy:  He has no cervical adenopathy.  Neurological:sedated  Skin: Skin is warm and dry. No rash noted. No erythema.  Access R groin HD cath site wnl. R neck IJ line wnl   LABS: Results for orders placed or performed during the hospital encounter of 08/21/15 (from the past 48 hour(s))  Glucose, capillary     Status: None   Collection Time: 08/31/15  4:27 PM  Result Value Ref Range   Glucose-Capillary 84 65 - 99 mg/dL   Comment 1 Notify RN    Comment 2 Document in Chart   ANA w/Reflex if Positive     Status: None   Collection Time: 08/31/15  4:53 PM  Result Value Ref Range   Anit Nuclear Antibody(ANA) Negative Negative    Comment: (NOTE) Performed At: [facility] [place], [place] [id number] Lindon Romp MD [id number]   Mpo/pr-3 (anca) antibodies     Status: None   Collection Time: 08/31/15  4:53 PM  Result Value Ref Range   Myeloperoxidase Abs <9.0 0.0 - 9.0 U/mL   ANCA Proteinase 3 <3.5 0.0 - 3.5 U/mL    Comment: (NOTE) Performed At: [facility] [address] [place], [place] [id number] Lindon Romp MD [id number]   C3 complement     Status:  None   Collection Time: 08/31/15  4:53 PM  Result Value Ref Range   C3 Complement 113 82 - 167 mg/dL    Comment: (NOTE) Performed At: [facility] [address] [place], [place] [id number] Lindon Romp MD [id number]   C4 complement     Status: None   Collection Time: 08/31/15  4:53 PM  Result Value Ref Range   Complement C4, Body Fluid 27 14 - 44 mg/dL    Comment: (NOTE) Performed At: [facility] [address] [place], [place] [id number]  Lindon Romp MD [id number]   Glomerular basement membrane antibodies     Status: None   Collection Time: 08/31/15  4:53 PM  Result Value Ref Range   GBM Ab 8 0 - 20 units    Comment: (NOTE)                   Negative                   0 - 20                   Weak Positive             21 - 30                   Moderate to Strong Positive   >30 Performed At: [facility] [place], [place] [id number] Lindon Romp MD [id number]   Antistreptolysin O titer     Status: None   Collection Time: 08/31/15  4:53 PM  Result Value Ref Range   ASO 91.0 0.0 - 200.0 IU/mL    Comment: (NOTE) Performed At: [facility] [place], [place] [id number] Lindon Romp MD [id number]   Protein Electro, Random Urine     Status: Abnormal   Collection Time: 08/31/15  8:03 PM  Result Value Ref Range   Total Protein, Urine 11.9 Not Estab. mg/dL   Albumin ELP, Urine 23.6 %   Alpha-1-Globulin, U 5.5 %   Alpha-2-Globulin, U 22.7 %   Beta Globulin, U 29.1 %   Gamma Globulin, U 19.1 %   M Component, Ur 19.0 (H) Not Observed %   PLEASE NOTE: Comment     Comment: (NOTE) Protein electrophoresis scan will follow via computer, mail, or courier delivery. Performed At: [facility] [place], [place] [id number] Lindon Romp MD [id number]   Glucose, capillary     Status: None   Collection Time: 09/01/15 12:41 AM  Result Value Ref Range    Glucose-Capillary 93 65 - 99 mg/dL  Basic metabolic panel     Status: Abnormal   Collection Time: 09/01/15  5:41 AM  Result Value Ref Range   Sodium 144 135 - 145 mmol/L   Potassium 4.0 3.5 - 5.1 mmol/L   Chloride 119 (H) 101 - 111 mmol/L   CO2 19 (L) 22 - 32 mmol/L   Glucose, Bld 89 65 - 99 mg/dL   BUN 76 (H) 6 - 20 mg/dL   Creatinine, Ser 4.78 (H) 0.61 - 1.24 mg/dL   Calcium 8.6 (L) 8.9 - 10.3 mg/dL   GFR calc non Af Amer 13 (L) >60 mL/min   GFR calc Af Amer 15 (L) >60 mL/min    Comment: (NOTE) The eGFR has been calculated using the CKD EPI equation. This calculation has not been validated in all clinical situations. eGFR's persistently <60 mL/min signify possible Chronic Kidney Disease.    Anion gap 6 5 - 15  Glucose, capillary     Status: None   Collection Time: 09/01/15  7:58 AM  Result Value Ref Range   Glucose-Capillary 87 65 - 99 mg/dL  Glucose, capillary     Status: None   Collection Time: 09/01/15 11:21 AM  Result Value Ref Range   Glucose-Capillary 85 65 - 99 mg/dL  Glucose, capillary     Status: None   Collection Time: 09/01/15  4:45 PM  Result Value Ref Range  Glucose-Capillary 86 65 - 99 mg/dL   Comment 1 Notify RN    Comment 2 Document in Chart   CKMB([facility] only)     Status: Abnormal   Collection Time: 09/01/15  6:20 PM  Result Value Ref Range   CK, MB 7.5 (H) 0.5 - 5.0 ng/mL  Glucose, capillary     Status: None   Collection Time: 09/01/15  9:20 PM  Result Value Ref Range   Glucose-Capillary 84 65 - 99 mg/dL   Comment 1 Notify RN   CBC     Status: Abnormal   Collection Time: 09/01/15  9:31 PM  Result Value Ref Range   WBC 18.5 (H) 3.8 - 10.6 K/uL   RBC 3.90 (L) 4.40 - 5.90 MIL/uL   Hemoglobin 11.2 (L) 13.0 - 18.0 g/dL   HCT 34.2 (L) 40.0 - 52.0 %   MCV 87.8 80.0 - 100.0 fL   MCH 28.8 26.0 - 34.0 pg   MCHC 32.9 32.0 - 36.0 g/dL   RDW 14.1 11.5 - 14.5 %   Platelets 173 150 - 440 K/uL  Basic metabolic panel     Status: Abnormal   Collection Time:  09/02/15  5:07 AM  Result Value Ref Range   Sodium 146 (H) 135 - 145 mmol/L   Potassium 4.2 3.5 - 5.1 mmol/L   Chloride 118 (H) 101 - 111 mmol/L   CO2 17 (L) 22 - 32 mmol/L   Glucose, Bld 100 (H) 65 - 99 mg/dL   BUN 77 (H) 6 - 20 mg/dL   Creatinine, Ser 4.91 (H) 0.61 - 1.24 mg/dL   Calcium 8.6 (L) 8.9 - 10.3 mg/dL   GFR calc non Af Amer 13 (L) >60 mL/min   GFR calc Af Amer 15 (L) >60 mL/min    Comment: (NOTE) The eGFR has been calculated using the CKD EPI equation. This calculation has not been validated in all clinical situations. eGFR's persistently <60 mL/min signify possible Chronic Kidney Disease.    Anion gap 11 5 - 15  Glucose, capillary     Status: None   Collection Time: 09/02/15  7:31 AM  Result Value Ref Range   Glucose-Capillary 89 65 - 99 mg/dL  Triglycerides     Status: None   Collection Time: 09/02/15 12:36 PM  Result Value Ref Range   Triglycerides 88 <150 mg/dL  Glucose, capillary     Status: Abnormal   Collection Time: 09/02/15  2:42 PM  Result Value Ref Range   Glucose-Capillary 124 (H) 65 - 99 mg/dL  Blood gas, arterial     Status: Abnormal (Preliminary result)   Collection Time: 09/02/15  3:55 PM  Result Value Ref Range   FIO2 0.50    Delivery systems VENTILATOR    Mode PRESSURE REGULATED VOLUME CONTROL    VT 500 mL   LHR 20 resp/min   Peep/cpap 10.0 cm H20   Inspiratory PAP PENDING    Expiratory PAP PENDING    pH, Arterial 7.29 (L) 7.350 - 7.450   pCO2 arterial 46 32.0 - 48.0 mmHg   pO2, Arterial 126 (H) 83.0 - 108.0 mmHg   Bicarbonate 22.1 21.0 - 28.0 mEq/L   Acid-base deficit 4.4 (H) 0.0 - 2.0 mmol/L   O2 Saturation 98.5 %   Patient temperature 37.0    Oxygen index PENDING    Collection site RIGHT RADIAL    Sample type ARTERIAL DRAW    Allens test (pass/fail) POSITIVE (A) PASS   No components found for: ESR,  C REACTIVE PROTEIN MICRO: Recent Results (from the past 720 hour(s))  Blood Culture (routine x 2)     Status: None   Collection  Time: 08/21/15 12:47 PM  Result Value Ref Range Status   Specimen Description BLOOD UNKNOWN  Final   Special Requests BOTTLES DRAWN AEROBIC AND ANAEROBIC  1CC  Final   Culture NO GROWTH 10 DAYS  Final   Report Status 08/31/2015 FINAL  Final  Blood Culture (routine x 2)     Status: None   Collection Time: 08/21/15 12:47 PM  Result Value Ref Range Status   Specimen Description BLOOD LEFT AC  Final   Special Requests BOTTLES DRAWN AEROBIC AND ANAEROBIC  6CC  Final   Culture NO GROWTH 10 DAYS  Final   Report Status 08/31/2015 FINAL  Final  Urine culture     Status: None   Collection Time: 08/21/15 12:47 PM  Result Value Ref Range Status   Specimen Description URINE, CATHETERIZED  Final   Special Requests Normal  Final   Culture NO GROWTH 2 DAYS  Final   Report Status 08/23/2015 FINAL  Final  MRSA PCR Screening     Status: None   Collection Time: 08/21/15 11:47 PM  Result Value Ref Range Status   MRSA by PCR NEGATIVE NEGATIVE Final    Comment:        The GeneXpert MRSA Assay (FDA approved for NASAL specimens only), is one component of a comprehensive MRSA colonization surveillance program. It is not intended to diagnose MRSA infection nor to guide or monitor treatment for MRSA infections.   C difficile quick scan w PCR reflex     Status: Abnormal   Collection Time: 08/22/15  2:31 PM  Result Value Ref Range Status   C Diff antigen POSITIVE (A) NEGATIVE Final   C Diff toxin POSITIVE (A) NEGATIVE Final   C Diff interpretation   Final    Positive for toxigenic C. difficile, active toxin production present.    Comment: CRITICAL RESULT CALLED TO, READ BACK BY AND VERIFIED WITH: CHARLIE FLEETWOOD,RN 08/22/2015 1544 BY J RAZZAKSUAREZ,MT   Culture, bal-quantitative     Status: None   Collection Time: 08/25/15  3:15 PM  Result Value Ref Range Status   Specimen Description BRONCHIAL ALVEOLAR LAVAGE  Final   Special Requests NONE  Final   Gram Stain   Final    GOOD SPECIMEN - 80-90%  WBCS FEW WBC SEEN NO ORGANISMS SEEN    Culture HOLDING FOR POSSIBLE PATHOGEN  Final   Report Status 08/27/2015 FINAL  Final    IMAGING: Dg Chest 1 View  09/02/2015  CLINICAL DATA:  Shortness of breath tonight. EXAM: CHEST 1 VIEW COMPARISON:  Most recent radiograph yesterday at 0301 hour. Additional prior exams reviewed. FINDINGS: Tip of the right central line in the distal SVC. Increasing hazy opacity in the left lung consistent pleural effusion. This is progressed from prior exam. Progressive left airspace opacity. Minimal vascular congestion the right lung with suprahilar atelectasis. Cardiomediastinal contours are unchanged, partially obscured by pleural effusion. IMPRESSION: Progressive left pleural effusion and left-sided airspace opacity, pulmonary edema, atelectasis or pneumonia. Vascular congestion in the right lung again seen. Electronically Signed   By: Jeb Levering M.D.   On: 09/02/2015 00:42   Dg Chest 1 View  09/01/2015  CLINICAL DATA:  Acute onset of sternal chest pain. Found unresponsive, with seizure like activity. Initial encounter. EXAM: CHEST 1 VIEW COMPARISON:  Chest radiograph performed 08/26/2015 FINDINGS: There is an  increasing moderate left-sided pleural effusion, with left-sided airspace opacification, which may reflect asymmetric pulmonary edema or pneumonia. Underlying vascular congestion is noted. The right lung is otherwise clear. No pneumothorax is seen. The cardiomediastinal silhouette is borderline normal in size. The right IJ line is noted ending about the distal SVC. No acute osseous abnormalities are seen. IMPRESSION: Increasing moderate left-sided pleural effusion, with left-sided airspace opacification, which may reflect asymmetric pulmonary edema or pneumonia. Underlying vascular congestion noted. Electronically Signed   By: Garald Balding M.D.   On: 09/01/2015 03:39   Dg Chest 1 View  08/23/2015  CLINICAL DATA:  Central line placed on the right. EXAM:  CHEST 1 VIEW COMPARISON:  08/21/2015. FINDINGS: Normal sized heart. Clear lungs. Endotracheal tube in satisfactory position. Nasogastric tube tip in the proximal stomach. Interval right jugular catheter with its tip in the upper right atrium or superior cavoatrial junction and. No pneumothorax. Unremarkable bones. IMPRESSION: 1. Right jugular catheter tip in the upper right atrium more superior cavoatrial junction without pneumothorax. 2. No acute abnormality. Electronically Signed   By: Claudie Revering M.D.   On: 08/23/2015 11:20   Dg Abd 1 View  09/02/2015  CLINICAL DATA:  OG tube placement. EXAM: ABDOMEN - 1 VIEW COMPARISON:  08/26/2015 FINDINGS: OG tube tip is in the midbody of the stomach. Bowel gas pattern is normal. No acute osseous abnormality. Double-lumen central venous catheter tip is at the L3 level in the inferior vena cava. IMPRESSION: OG tube in the body of the stomach. No change in the central venous catheter. Normal bowel gas pattern. Electronically Signed   By: Lorriane Shire M.D.   On: 09/02/2015 13:49   Dg Abd 1 View  08/26/2015  CLINICAL DATA:  Nasogastric tube placement EXAM: ABDOMEN - 1 VIEW COMPARISON:  07/21/2015 FINDINGS: Motion degraded study showing nasogastric tube tip at the proximal stomach. Dialysis catheter by right femoral approach, tip at the L2-3 disc level. Moderate gaseous distension of bowel with nonobstructive pattern. IMPRESSION: Nasogastric tube tip at the proximal stomach. Electronically Signed   By: Monte Fantasia M.D.   On: 08/26/2015 05:47   Ct Head Wo Contrast  08/21/2015  CLINICAL DATA:  Refractory seizures EXAM: CT HEAD WITHOUT CONTRAST TECHNIQUE: Contiguous axial images were obtained from the base of the skull through the vertex without intravenous contrast. COMPARISON:  Head CT August 24, 2011; brain MRI August 26, 2011 FINDINGS: The ventricles are normal in size and configuration. There is no intracranial mass, hemorrhage, extra-axial fluid collection, or  midline shift. By CT, the gray-white compartments appear normal. The increased T2 signal in the periventricular white matter noted on prior MR is not appreciable as abnormality on this noncontrast enhanced CT. No acute infarct in particular is evident on this study. The bony calvarium appears intact. The mastoid air cells are clear. IMPRESSION: Study within normal limits. Electronically Signed   By: Lowella Grip III M.D.   On: 08/21/2015 13:33   US Venous Img Lower Unilateral Left  08/31/2015  CLINICAL DATA:  Recent history of fall, now with left lower extremity pain and edema for the past 3 weeks. History of smoking. Evaluate for DVT. EXAM: LEFT LOWER EXTREMITY VENOUS DOPPLER ULTRASOUND TECHNIQUE: Gray-scale sonography with graded compression, as well as color Doppler and duplex ultrasound were performed to evaluate the lower extremity deep venous systems from the level of the common femoral vein and including the common femoral, femoral, profunda femoral, popliteal and calf veins including the posterior tibial, peroneal and gastrocnemius veins  when visible. The superficial great saphenous vein was also interrogated. Spectral Doppler was utilized to evaluate flow at rest and with distal augmentation maneuvers in the common femoral, femoral and popliteal veins. COMPARISON:  None. FINDINGS: Contralateral Common Femoral Vein: Respiratory phasicity is normal and symmetric with the symptomatic side. No evidence of thrombus. Normal compressibility. Common Femoral Vein: No evidence of thrombus. Normal compressibility, respiratory phasicity and response to augmentation. Saphenofemoral Junction: No evidence of thrombus. Normal compressibility and flow on color Doppler imaging. Profunda Femoral Vein: No evidence of thrombus. Normal compressibility and flow on color Doppler imaging. Femoral Vein: No evidence of thrombus. Normal compressibility, respiratory phasicity and response to augmentation. Popliteal Vein: No  evidence of thrombus. Normal compressibility, respiratory phasicity and response to augmentation. Calf Veins: No evidence of thrombus. Normal compressibility and flow on color Doppler imaging. Superficial Great Saphenous Vein: No evidence of thrombus. Normal compressibility and flow on color Doppler imaging. Venous Reflux:  None. Other Findings:  None. IMPRESSION: No evidence of DVT within the left lower extremity. Electronically Signed   By: Sandi Mariscal M.D.   On: 08/31/2015 12:22   Dg Chest Port 1 View  09/02/2015  CLINICAL DATA:  Endotracheal and feeding tube placement. EXAM: PORTABLE CHEST 1 VIEW COMPARISON:  Earlier today at zero 2600 hours. FINDINGS: 1321 hours. Endotracheal tube terminates 3.2 cm above carina. Nasogastric tube extends beyond the inferior aspect of the film. Right internal jugular line tip at low SVC. Borderline cardiomegaly. Clear right lung. Similar appearance of left hemi thorax opacification secondary to pleural fluid and lower lobe predominant airspace disease. Residual aerated left upper lung remains. IMPRESSION: Appropriate position of endotracheal tube. Nasogastric tube extends beyond the inferior aspect of the film. Similar left-sided pleural fluid and airspace disease. Electronically Signed   By: Abigail Miyamoto M.D.   On: 09/02/2015 13:49   Dg Chest Port 1 View  08/26/2015  CLINICAL DATA:  Self extubated EXAM: PORTABLE CHEST 1 VIEW COMPARISON:  Yesterday FINDINGS: Replaced endotracheal tube tip in good position between clavicular heads and carina. Right IJ central line remains in good position, tip at the upper right atrium. An orogastric tube at least reaches the stomach. New hazy opacity at the bases with bandlike atelectasis along the right minor fissure. No overt edema. No effusion or pneumothorax. IMPRESSION: 1. Replaced endotracheal tube in good position. 2. New basilar lung opacities favor atelectasis or aspiration given the circumstances. Electronically Signed   By:  Monte Fantasia M.D.   On: 08/26/2015 05:46   Dg Chest Port 1 View  08/25/2015  CLINICAL DATA:  Respiratory failure, intubated. EXAM: PORTABLE CHEST 1 VIEW COMPARISON:  08/23/2015 FINDINGS: Endotracheal tube, NG tube and right central line remain in place, unchanged. Low lung volumes with increasing vascular congestion. No overt edema. Heart is upper limits normal in size. No visible effusions. IMPRESSION: Decreasing lung volumes with increasing vascular congestion. Electronically Signed   By: Rolm Baptise M.D.   On: 08/25/2015 11:57   Dg Chest Portable 1 View  08/21/2015  CLINICAL DATA:  Status post intubation.  Seizure. EXAM: PORTABLE CHEST 1 VIEW COMPARISON:  Earlier today FINDINGS: Satisfactory position of the ET tube with tip above the carina. Heart size appears normal. No pleural effusion or edema. No airspace consolidation. IMPRESSION: 1. No complications after E ET tube placement. The tip is in satisfactory position above the carina. Electronically Signed   By: Kerby Moors M.D.   On: 08/21/2015 15:20   Dg Chest Port 1 View  08/21/2015  CLINICAL DATA:  Unresponsive.  Seizure activity. EXAM: PORTABLE CHEST 1 VIEW COMPARISON:  09/11/2011 FINDINGS: The heart size is normal. There is no pleural effusion or edema. No airspace consolidation identified. The visualized bony structures appear intact. IMPRESSION: 1. No acute cardiopulmonary abnormalities. Electronically Signed   By: Kerby Moors M.D.   On: 08/21/2015 13:51   Dg Abd Portable 1v  08/21/2015  CLINICAL DATA:  Bedside orogastric tube placement. Patient found unresponsive by a maintenance person at his apartment complex. Multiple witnessed seizures during the ambulance tried to the hospital. EXAM: PORTABLE ABDOMEN - 1 VIEW COMPARISON:  CT abdomen and pelvis 09/03/2011. FINDINGS: Orogastric tube tip barely in the fundus of the stomach and could be advanced. Nonobstructive bowel gas pattern. Gas within normal caliber colon from cecum to  rectum. Gas within normal caliber small bowel loops throughout the abdomen. An opaque foreign body (ring) projects over the right upper pelvis. Foley catheter noted within the urinary bladder. Phleboliths low in both sides of the pelvis. Degenerative changes involving the lower lumbar spine. IMPRESSION: 1. Orogastric tube tip just into the fundus of the stomach. This could be further advanced. 2. No acute abdominal abnormality. 3. Opaque foreign body (ring) projects over the right upper pelvis. If this is not in the patient's pocket, it may be in a loop of distal small bowel. Electronically Signed   By: Evangeline Dakin M.D.   On: 08/21/2015 16:04   US Abdomen Limited Ruq  08/21/2015  CLINICAL DATA:  Elevated liver function tests. EXAM: US ABDOMEN LIMITED - RIGHT UPPER QUADRANT COMPARISON:  09/03/2011 FINDINGS: Gallbladder: No gallstones or wall thickening visualized. No sonographic Murphy sign noted. Common bile duct: Diameter: 3.2 mm Liver: Liver shows increased echogenicity and coarsened echotexture. No mass or focal lesion. Hepatopetal flow documented in the portal vein. IMPRESSION: 1. No acute finding.  Normal gallbladder.  No bile duct dilation. 2. Increased echogenicity of the liver consistent with fatty infiltration. This is similar to the prior ultrasound. Electronically Signed   By: Lajean Manes M.D.   On: 08/21/2015 21:59    Assessment:   Ross [name] is a 49 y.o. male with prolonged admission after being found unresponsive, with ARF, rhabdo, sepsis, prolonged intubated, now with repeat sepsis in setting of HD with possible recurrent PNA and two central lines in place. Also has C diff. HIV is negative. [place] pending  Recommendations Cont vanco and zosyn pending cxs Added anidulafungin for candidemia coverage Consider repeat bronch or CT chest  Would change neck line and consider removing HD cath  Thank you very much for allowing me to participate in the care of this patient. Please  call with questions.   Cheral Marker. Ola Spurr, MD

## 2015-09-02 NOTE — Procedures (Signed)
Endotracheal Intubation: Patient required placement of an artificial airway secondary to resp failure.   Consent: Emergent.   Hand washing performed prior to starting the procedure.   Medications administered for sedation prior to procedure: Midazolam 4 mg IV,  Vecuronium 10 mg IV, Fentanyl 100 mcg IV.   Procedure: A time out procedure was called and correct patient, name, & ID confirmed. Needed supplies and equipment were assembled and checked to include ETT, 10 ml syringe, Glidescope, Mac and Miller blades, suction, oxygen and bag mask valve, end tidal CO2 monitor. Patient was positioned to align the mouth and pharynx to facilitate visualization of the glottis.  Heart rate, SpO2 and blood pressure was continuously monitored during the procedure. Pre-oxygenation was conducted prior to intubation and endotracheal tube was placed through the vocal cords into the trachea.  During intubation an assistant applied gentle pressure to the cricoid cartilage.   The artificial airway was placed under direct visualization via glidescope route using a 8.5 ETT on the first attempt.    ETT was secured at 24 cm mark.    Placement was confirmed by auscuitation of lungs with good breath sounds bilaterally and no stomach sounds.  Condensation was noted on endotracheal tube.  Pulse ox 98%.  CO2 detector in place with appropriate color change.   Complications: None .   Operator: Luther Newhouse.   Chest radiograph ordered and pending.   Comments: OGT placed via glidescope.  Lucie LeatherKurian David Lamont Tant, M.D.  [facility]  Medical Director [facility] Medical Director [facility] Cardio[facility]

## 2015-09-02 NOTE — Progress Notes (Signed)
Pt. Remained ST with HR 127-128 and RR. In the 40's. Pt's O2  SATS dropped into the 80's, RT placed on venti mask. SAT's went back up into the 90's but pt kept taking it off. Pt continued to breathing rapidly hrough mouth and had to be prompted to breath slowly. Pt. AO but had some hallucinations. Pt voiding well, condom cath used most of the shift. MD notified of Pt condition and order for Haldol 1mg  placed. Not much change in Pt's condition.

## 2015-09-02 NOTE — Clinical Social Work Note (Signed)
CSW received call from patient's sister: Stacie GlazeStephanie Green: [phone] wanting an update regarding his "mental health." Ms. Green had patient's password. CSW directed Ms. Green to contact ICU nurse for a medical update as patient's medical condition declined and was transferred back to ICU and placed on a ventilator. CSW made Ms. Green aware that all plans for PT rehab have been placed on hold for now.  York SpanielMonica Georgann Bramble MSW,LCSW

## 2015-09-02 NOTE — Progress Notes (Addendum)
[facility] [place] [facility]   PATIENT NAME: Ross [name]    MR#:  [id number]  DATE OF BIRTH:  Aug 24, 1966  SUBJECTIVE: Patient has shortness of breath, hypoxia last night. Patient rates rate was 48, her hypoxia with O2 sat dropping to 80%. Patient put on Ventimask, saturation improved to 92%. Had course breath sounds, worsening renal failure. Patient is still tachypneic. Discussed the case with Dr.Munsoor Lateef ,we will arrange for hemodialysis today.   CHIEF COMPLAINT:   Chief Complaint  Patient presents with  . Seizures     REVIEW OF SYSTEMS:   Review of Systems  Constitutional: Negative for fever and chills.  HENT: Negative for hearing loss.   Eyes: Negative for blurred vision, double vision and photophobia.  Respiratory: Positive for shortness of breath. Negative for cough, hemoptysis, sputum production and wheezing.   Cardiovascular: Negative for chest pain, palpitations, orthopnea and leg swelling.  Gastrointestinal: Negative for nausea, vomiting, abdominal pain and diarrhea.  Genitourinary: Negative for dysuria and urgency.  Musculoskeletal: Positive for myalgias. Negative for neck pain.  Skin: Negative for rash.  Neurological: Negative for dizziness, focal weakness, seizures, weakness and headaches.  Psychiatric/Behavioral: Negative for memory loss. The patient does not have insomnia.     Have anxiety, PTSD issues. Received some Haldol for hallucinations. DRUG ALLERGIES:   Allergies  Allergen Reactions  . Antihistamines, Diphenhydramine-Type Anaphylaxis and Rash  . Benadryl [Diphenhydramine Hcl] Anaphylaxis and Rash  . Hydroxyzine Anaphylaxis and Rash    VITALS:  Blood pressure 168/103, pulse 129, temperature 98.7 F (37.1 C), temperature source Oral, resp. rate 20, height 5\' 11"  (1.803 m), weight 117.935 kg (260 lb), SpO2 94 %.  PHYSICAL EXAMINATION:  GENERAL:  49 y.o.-year-old patient   seen in the room, has some respiratory  distress. LUNGS: Some coarse breath sounds bilaterally. CARDIOVASCULAR: S1, S2 normal. No murmurs, rubs, or gallops. Tachycardic today with the heart rate 1 27 bpm. ABDOMEN: Soft, nontender, nondistended. Bowel sounds present. No organomegaly or mass.  EXTREMITIES: left leg edema. cyanosis, or clubbing. Peripheral pulses 2+ NEUROLOGIC: alert, CN's grossly intact, moves all extremities, oriented to person, follows comands SKIN: No obvious rash, lesion, or ulcer.  Bilateral low excellent edema present.  Psych;anxious .  LABORATORY PANEL:   CBC  Recent Labs Lab 09/01/15 2131  WBC 18.5*  HGB 11.2*  HCT 34.2*  PLT 173   ------------------------------------------------------------------------------------------------------------------  Chemistries   Recent Labs Lab 08/26/15 1819  08/28/15 0500  09/02/15 0507  NA  --   < > 145  < > 146*  K 3.6  < > 3.6  < > 4.2  CL  --   < > 115*  < > 118*  CO2  --   < > 21*  < > 17*  GLUCOSE  --   < > 152*  < > 100*  BUN  --   < > 70*  < > 77*  CREATININE  --   < > 4.72*  < > 4.91*  CALCIUM  --   < > 8.4*  < > 8.6*  MG 2.0  --   --   --   --   AST  --   < > 474*  --   --   ALT  --   < > 270*  --   --   ALKPHOS  --   < > 361*  --   --   BILITOT  --   < > 1.0  --   --   < > =  values in this interval not displayed. ------------------------------------------------------------------------------------------------------------------  Cardiac Enzymes No results for input(s): TROPONINI in the last 168 hours. ------------------------------------------------------------------------------------------------------------------  RADIOLOGY:  Dg Chest 1 View  09/02/2015  CLINICAL DATA:  Shortness of breath tonight. EXAM: CHEST 1 VIEW COMPARISON:  Most recent radiograph yesterday at 0301 hour. Additional prior exams reviewed. FINDINGS: Tip of the right central line in the distal SVC. Increasing hazy opacity in the left lung consistent pleural effusion.  This is progressed from prior exam. Progressive left airspace opacity. Minimal vascular congestion the right lung with suprahilar atelectasis. Cardiomediastinal contours are unchanged, partially obscured by pleural effusion. IMPRESSION: Progressive left pleural effusion and left-sided airspace opacity, pulmonary edema, atelectasis or pneumonia. Vascular congestion in the right lung again seen. Electronically Signed   By: Rubye Oaks M.D.   On: 09/02/2015 00:42   Dg Chest 1 View  09/01/2015  CLINICAL DATA:  Acute onset of sternal chest pain. Found unresponsive, with seizure like activity. Initial encounter. EXAM: CHEST 1 VIEW COMPARISON:  Chest radiograph performed 08/26/2015 FINDINGS: There is an increasing moderate left-sided pleural effusion, with left-sided airspace opacification, which may reflect asymmetric pulmonary edema or pneumonia. Underlying vascular congestion is noted. The right lung is otherwise clear. No pneumothorax is seen. The cardiomediastinal silhouette is borderline normal in size. The right IJ line is noted ending about the distal SVC. No acute osseous abnormalities are seen. IMPRESSION: Increasing moderate left-sided pleural effusion, with left-sided airspace opacification, which may reflect asymmetric pulmonary edema or pneumonia. Underlying vascular congestion noted. Electronically Signed   By: Roanna Raider M.D.   On: 09/01/2015 03:39   US Venous Img Lower Unilateral Left  08/31/2015  CLINICAL DATA:  Recent history of fall, now with left lower extremity pain and edema for the past 3 weeks. History of smoking. Evaluate for DVT. EXAM: LEFT LOWER EXTREMITY VENOUS DOPPLER ULTRASOUND TECHNIQUE: Gray-scale sonography with graded compression, as well as color Doppler and duplex ultrasound were performed to evaluate the lower extremity deep venous systems from the level of the common femoral vein and including the common femoral, femoral, profunda femoral, popliteal and calf veins  including the posterior tibial, peroneal and gastrocnemius veins when visible. The superficial great saphenous vein was also interrogated. Spectral Doppler was utilized to evaluate flow at rest and with distal augmentation maneuvers in the common femoral, femoral and popliteal veins. COMPARISON:  None. FINDINGS: Contralateral Common Femoral Vein: Respiratory phasicity is normal and symmetric with the symptomatic side. No evidence of thrombus. Normal compressibility. Common Femoral Vein: No evidence of thrombus. Normal compressibility, respiratory phasicity and response to augmentation. Saphenofemoral Junction: No evidence of thrombus. Normal compressibility and flow on color Doppler imaging. Profunda Femoral Vein: No evidence of thrombus. Normal compressibility and flow on color Doppler imaging. Femoral Vein: No evidence of thrombus. Normal compressibility, respiratory phasicity and response to augmentation. Popliteal Vein: No evidence of thrombus. Normal compressibility, respiratory phasicity and response to augmentation. Calf Veins: No evidence of thrombus. Normal compressibility and flow on color Doppler imaging. Superficial Great Saphenous Vein: No evidence of thrombus. Normal compressibility and flow on color Doppler imaging. Venous Reflux:  None. Other Findings:  None. IMPRESSION: No evidence of DVT within the left lower extremity. Electronically Signed   By: Simonne Come M.D.   On: 08/31/2015 12:22    EKG:   Orders placed or performed during the hospital encounter of 08/21/15  . EKG 12-Lead  . EKG 12-Lead    ASSESSMENT AND PLAN:   #1 fever/sepsis: Likely due to C. difficile  colitis - Influenza negative, urine and blood cultures negative, HIV hepatitis panel negative - Clostridium difficile positive on oral vancomycin,fevers going down.  started on November 7. Will continue for 14 days. -  #2 acute respiratory failure with hypoxia and altered mental status:   -Now has hypoxia, worsening renal  failure: Patient is a chest x-ray was done which showed pulmonary vascular congestion, possible left-sided pneumonia. Discussed with nephrology,, patient will get hemodialysis today.  #3 Rhabdomyolysis: CK trending down,continue to monitor. Stopped  IV hydration. - Possibly due to prolonged immobilization, hypotension, diarrhea, possible withdrawal seizures prior to admission. Patient does not remember time prior to admission. Hold off on IV fluids secondary to shortness of breath and pulmonary edema at this time., , CK decreasing slowly Myalgias secondary to rhabdomyolysis.  #4 acute renal failure: secondary to ATN from one depletion, rhabdomyolysis, C. Difficile. Patient renal function is worsening today , Pulmonary  edema, shortness of breath,, and renal failure: Patient needs hemodialysis, appreciate nephrology help.  A#5 thrombocytopenia: Improving - Possibly due to sepsis in the setting of C. difficile infection - patient also with history of heavy ETOH use. - stable, no bleeding noted  #6 elevated troponin - No concerning EKG changes, no events on telemetry.  - Heparin drip stopped due to thrombocytopenia - 2-D echocardiogram is normal with no focal wall motion abnormalities - Likely related to rhabdo   #7 altered mental status - responding appropriately today  #8 transaminitis: improving,LFTS trending down. - Viral hepatitis panel negative - Some of the AST likely due to rhabdomyolysis - Likely shock liver as well as possible ETOH hepatitis    9.bradycardia;asymptomatic, resolved.    10.left leg edema;dvt study is negative.   #11 ,axiety, mental disorder with PTSD: .schizophrenia;consult psych   Fever, hypoxia and chills patient seen in the dialysis unit.. Transferring him to ICU for possible line sepsis and acute respiratory failure. Continue oxygen, cultures from the dialysis catheter, blood cultures are present. Giving empiric IV vancomycin, Zosyn. Follow culture  data. Condition is critical. Patient may need intubation.  Patient is still needing hemodialysis so he may not go SNF> he needs hemodialysis unable to discharge to [facility]. Today.likely d/c to [facility] tomorrow.  All the records are reviewed and case discussed with Care Management/Social Workerr. Management plans discussed with the patient, family and they are in agreement.  CODE STATUS: Full   TOTAL CRITICAL CARE TIME TAKING CARE OF THIS PATIENT: 35 minutes.  CCT today  POSSIBLE D/C  3-4DAYS, DEPENDING ON CLINICAL CONDITION.   Katha Hamming M.D on 09/02/2015 at 9:52 AM  Between 7am to 6pm - Pager - [phone]  After 6pm go to [email or web address] - password [email or web address] [facility]  [facility] [facility]  Office  [phone]  CC: Primary care physician; No primary care provider on file.

## 2015-09-02 NOTE — Care Management Note (Signed)
Case Management Note  Patient Details  Name: Pasty SpillersJames L Bonczek MRN: [id number] Date of Birth: 1966-08-04  Subjective/Objective:  Transferred back to CCU follow respiratory distress and increased work of breathing. Intubated.                     Action/Plan: TC to Santa GeneraAndrea Pike with [facility] regarding LTACH. She is coming to evaluate patient today.   Expected Discharge Date:                  Expected Discharge Plan:     In-House Referral:     Discharge planning Services     Post Acute Care Choice:    Choice offered to:     DME Arranged:    DME Agency:     HH Arranged:    HH Agency:     Status of Service:     Medicare Important Message Given:  Yes Date Medicare IM Given:    Medicare IM give by:    Date Additional Medicare IM Given:    Additional Medicare Important Message give by:     If discussed at Long Length of Stay Meetings, dates discussed:    Additional Comments:  Marily MemosLisa M Kafi Dotter, RN 09/02/2015, 1:40 PM

## 2015-09-03 LAB — BASIC METABOLIC PANEL
Anion gap: 10 (ref 5–15)
BUN: 68 mg/dL — AB (ref 6–20)
CHLORIDE: 112 mmol/L — AB (ref 101–111)
CO2: 22 mmol/L (ref 22–32)
CREATININE: 4.87 mg/dL — AB (ref 0.61–1.24)
Calcium: 8.1 mg/dL — ABNORMAL LOW (ref 8.9–10.3)
GFR calc Af Amer: 15 mL/min — ABNORMAL LOW (ref >60)
GFR calc non Af Amer: 13 mL/min — ABNORMAL LOW (ref >60)
GLUCOSE: 127 mg/dL — AB (ref 65–99)
POTASSIUM: 5.1 mmol/L (ref 3.5–5.1)
SODIUM: 144 mmol/L (ref 135–145)

## 2015-09-03 LAB — CBC
HEMATOCRIT: 31.7 % — AB (ref 40.0–52.0)
Hemoglobin: 10 g/dL — ABNORMAL LOW (ref 13.0–18.0)
MCH: 28.6 pg (ref 26.0–34.0)
MCHC: 31.5 g/dL — AB (ref 32.0–36.0)
MCV: 90.9 fL (ref 80.0–100.0)
PLATELETS: 199 10*3/uL (ref 150–440)
RBC: 3.48 MIL/uL — ABNORMAL LOW (ref 4.40–5.90)
RDW: 14.7 % — AB (ref 11.5–14.5)
WBC: 15.6 10*3/uL — ABNORMAL HIGH (ref 3.8–10.6)

## 2015-09-03 LAB — EXPECTORATED SPUTUM ASSESSMENT W GRAM STAIN, RFLX TO RESP C: Special Requests: NORMAL

## 2015-09-03 LAB — GLUCOSE, CAPILLARY
GLUCOSE-CAPILLARY: 94 mg/dL (ref 65–99)
GLUCOSE-CAPILLARY: 106 mg/dL — AB (ref 65–99)
Glucose-Capillary: 116 mg/dL — ABNORMAL HIGH (ref 65–99)
Glucose-Capillary: 106 mg/dL — ABNORMAL HIGH (ref 65–99)
Glucose-Capillary: 104 mg/dL — ABNORMAL HIGH (ref 65–99)

## 2015-09-03 LAB — RAPID HIV SCREEN (HIV 1/2 AB+AG)
HIV 1/2 ANTIBODIES: NONREACTIVE
HIV-1 P24 ANTIGEN - HIV24: NONREACTIVE

## 2015-09-03 MED ORDER — FREE WATER
30.0000 mL | Status: DC
  Administered 2015-09-03 – 2015-09-07 (×21): 30 mL

## 2015-09-03 MED ORDER — VITAL HIGH PROTEIN PO LIQD
1000.0000 mL | ORAL | Status: DC
  Administered 2015-09-03 – 2015-09-06 (×5): 1000 mL

## 2015-09-03 MED ORDER — PRO-STAT SUGAR FREE PO LIQD
30.0000 mL | Freq: Four times a day (QID) | ORAL | Status: DC
  Administered 2015-09-03 – 2015-09-04 (×4): 30 mL via ORAL

## 2015-09-03 NOTE — Clinical Social Work Note (Signed)
RN CM was [facility] which mother was in agreement with and now has possibly changed her mind.  York SpanielMonica Jaryiah [name] MSW,LCSW [phone]

## 2015-09-03 NOTE — Progress Notes (Signed)
Nutrition Follow-up  DOCUMENTATION CODES:      INTERVENTION:  Coordination of care: MD Kasa verbal order received to start enteral nutrition at this time. Recommend vital high protein at goal rate of 6020ml/hr with prostat 4 times per day.  Will provide 880 kcals and 102 g of protein, free water from tube feeding 42503ml/d.  Additional 747 kcals from diprivan.     NUTRITION DIAGNOSIS:   Inadequate oral intake related to acute illness as evidenced by NPO status.    GOAL:   Provide needs based on ASPEN/SCCM guidelines    MONITOR:    (Energy intake, Digestive system, Electrolyte and renal profile)  REASON FOR ASSESSMENT:   Consult Enteral/tube feeding initiation and management  ASSESSMENT:      Pt acute respiratory failure on vent with sepsis and pneumonia   Current Nutrition: NPO   Gastrointestinal Profile: Last BM: c-diff positive 11/16   Scheduled Medications:  . anidulafungin  100 mg Intravenous Q24H  . antiseptic oral rinse  7 mL Mouth Rinse QID  . chlorhexidine gluconate  15 mL Mouth Rinse BID  . heparin subcutaneous  5,000 Units Subcutaneous 3 times per day  . insulin aspart  0-5 Units Subcutaneous QHS  . insulin aspart  0-9 Units Subcutaneous TID WC  . ipratropium-albuterol  3 mL Nebulization Q4H  . pantoprazole (PROTONIX) IV  40 mg Intravenous Q2000  . piperacillin-tazobactam (ZOSYN)  IV  3.375 g Intravenous Q12H  . vancomycin  125 mg Oral 4 times per day  . [START ON 09/04/2015] vancomycin  1,000 mg Intravenous Q M,W,F-HD    Continuous Medications:  . fentaNYL infusion INTRAVENOUS 200 mcg/hr (09/03/15 1239)  . norepinephrine 5 mcg/min (09/03/15 1238)  . propofol (DIPRIVAN) infusion 40 mcg/kg/min (09/03/15 0900)   Diprivan providing 747 kcals/d with rate of 28.293ml/hr   Electrolyte/Renal Profile and Glucose Profile:   Recent Labs Lab 09/01/15 0541 09/02/15 0507 09/03/15 0807  NA 144 146* 144  K 4.0 4.2 5.1  CL 119* 118* 112*  CO2 19* 17*  22  BUN 76* 77* 68*  CREATININE 4.78* 4.91* 4.87*  CALCIUM 8.6* 8.6* 8.1*  GLUCOSE 89 100* 127*   Protein Profile:  Recent Labs Lab 08/28/15 0500  ALBUMIN 2.0*      Weight Trend since Admission: Filed Weights   09/02/15 1015 09/02/15 1400 09/03/15 0500  Weight: 259 lb 14.8 oz (117.9 kg) 249 lb 1.9 oz (113 kg) 257 lb 15 oz (117 kg)      Diet Order:  Diet NPO time specified  Skin:   reviewed   Height:   Ht Readings from Last 1 Encounters:  08/21/15 5\' 11"  (1.803 m)    Weight:   Wt Readings from Last 1 Encounters:  09/03/15 257 lb 15 oz (117 kg)     BMI:  Body mass index is 35.99 kg/(m^2).  Estimated Nutritional Needs:   Kcal:  [id number] kcals/d (11-14 kcals/kg Actual BW 117kg)  Protein:  (2.0-2.5 g/kg) Using IBW 78 kg 156-195 g/d  Fluid:  (1000ml + UOP)  EDUCATION NEEDS:   No education needs identified at this time  HIGH Care Level  Terez Montee B. Freida BusmanAllen, RD, LDN [phone] (pager)

## 2015-09-03 NOTE — Progress Notes (Signed)
Patient's mother Janina MayoShirley Soderholm called and said she does not want patient to transfer to [facility] since he is critical. I passed this information along to Alum CreekNan with case management.

## 2015-09-03 NOTE — Care Management (Signed)
Intensivist will consult with attending but [facility]  have paged attending to discuss.  Santa GeneraAndrea Pike with [facility]  has spoken with patient's mother and informed she is in agreement with transfer.  Sue Lushndrea will speak also to patient's father.  Nephrology note indicates that HD is not being considered for today.

## 2015-09-03 NOTE — Progress Notes (Signed)
Subjective:  Pt seen at bedside.. Now intubated and on vent. Sating well. Had HD yesterday. UOP 2.1 liters in the past 24 hours.  Objective:  Vital signs in last 24 hours:  Temp:  [97.5 F (36.4 C)-101.7 F (38.7 C)] 97.7 F (36.5 C) (11/17 0900) Pulse Rate:  [64-130] 77 (11/17 0900) Resp:  [16-43] 20 (11/17 0900) BP: (62-174)/(45-119) 101/65 mmHg (11/17 0900) SpO2:  [90 %-100 %] 98 % (11/17 0900) FiO2 (%):  [40 %-80 %] 40 % (11/17 0800) Weight:  [113 kg (249 lb 1.9 oz)-117.9 kg (259 lb 14.8 oz)] 117 kg (257 lb 15 oz) (11/17 0500)  Weight change: -0.035 kg (-1.2 oz) Filed Weights   09/02/15 1015 09/02/15 1400 09/03/15 0500  Weight: 117.9 kg (259 lb 14.8 oz) 113 kg (249 lb 1.9 oz) 117 kg (257 lb 15 oz)    Intake/Output:    Intake/Output Summary (Last 24 hours) at 09/03/15 0954 Last data filed at 09/03/15 0900  Gross per 24 hour  Intake 4497.24 ml  Output   2120 ml  Net 2377.24 ml     Physical Exam: General:  critically ill appearing.  HEENT  [place]/AT ETT in place  Neck  [name]  Pulm/lungs  bilateral rhonchi, vent assisted  CVS/Heart  S1S2 no rubs  Abdomen:   soft, +bowel sounds, nontender  Extremities:  1+ b/l LE edema  Neurologic:  on the vent but arousable  Skin:  no acute rashes  Access: Right femoral non tunneled catheter Dr. Belia Heman 11/6       Basic Metabolic Panel:   Recent Labs Lab 08/30/15 0643 08/31/15 0622 09/01/15 0541 09/02/15 0507 09/03/15 0807  NA 144 146* 144 146* 144  K 3.2* 3.8 4.0 4.2 5.1  CL 119* 120* 119* 118* 112*  CO2 18* 21* 19* 17* 22  GLUCOSE 119* 109* 89 100* 127*  BUN 86* 83* 76* 77* 68*  CREATININE 4.93* 4.85* 4.78* 4.91* 4.87*  CALCIUM 9.1 8.5* 8.6* 8.6* 8.1*     CBC:  Recent Labs Lab 08/28/15 0500 08/28/15 1632 09/01/15 2131 09/03/15 0807  WBC 17.4* 21.5* 18.5* 15.6*  HGB 12.3* 13.1 11.2* 10.0*  HCT 37.7* 38.9* 34.2* 31.7*  MCV 88.5 87.7 87.8 90.9  PLT 145* 167 173 199      Microbiology:  Recent  Results (from the past 720 hour(s))  Blood Culture (routine x 2)     Status: None   Collection Time: 08/21/15 12:47 PM  Result Value Ref Range Status   Specimen Description BLOOD UNKNOWN  Final   Special Requests BOTTLES DRAWN AEROBIC AND ANAEROBIC  1CC  Final   Culture NO GROWTH 10 DAYS  Final   Report Status 08/31/2015 FINAL  Final  Blood Culture (routine x 2)     Status: None   Collection Time: 08/21/15 12:47 PM  Result Value Ref Range Status   Specimen Description BLOOD LEFT AC  Final   Special Requests BOTTLES DRAWN AEROBIC AND ANAEROBIC  6CC  Final   Culture NO GROWTH 10 DAYS  Final   Report Status 08/31/2015 FINAL  Final  Urine culture     Status: None   Collection Time: 08/21/15 12:47 PM  Result Value Ref Range Status   Specimen Description URINE, CATHETERIZED  Final   Special Requests Normal  Final   Culture NO GROWTH 2 DAYS  Final   Report Status 08/23/2015 FINAL  Final  MRSA PCR Screening     Status: None   Collection Time: 08/21/15 11:47 PM  Result Value Ref Range Status   MRSA by PCR NEGATIVE NEGATIVE Final    Comment:        The GeneXpert MRSA Assay (FDA approved for NASAL specimens only), is one component of a comprehensive MRSA colonization surveillance program. It is not intended to diagnose MRSA infection nor to guide or monitor treatment for MRSA infections.   C difficile quick scan w PCR reflex     Status: Abnormal   Collection Time: 08/22/15  2:31 PM  Result Value Ref Range Status   C Diff antigen POSITIVE (A) NEGATIVE Final   C Diff toxin POSITIVE (A) NEGATIVE Final   C Diff interpretation   Final    Positive for toxigenic C. difficile, active toxin production present.    Comment: CRITICAL RESULT CALLED TO, READ BACK BY AND VERIFIED WITH: CHARLIE FLEETWOOD,RN 08/22/2015 1544 BY J RAZZAKSUAREZ,MT   Culture, bal-quantitative     Status: None   Collection Time: 08/25/15  3:15 PM  Result Value Ref Range Status   Specimen Description BRONCHIAL  ALVEOLAR LAVAGE  Final   Special Requests NONE  Final   Gram Stain   Final    GOOD SPECIMEN - 80-90% WBCS FEW WBC SEEN NO ORGANISMS SEEN    Culture HOLDING FOR POSSIBLE PATHOGEN  Final   Report Status 08/27/2015 FINAL  Final  MRSA PCR Screening     Status: None   Collection Time: 09/02/15 10:27 PM  Result Value Ref Range Status   MRSA by PCR NEGATIVE NEGATIVE Final    Comment:        The GeneXpert MRSA Assay (FDA approved for NASAL specimens only), is one component of a comprehensive MRSA colonization surveillance program. It is not intended to diagnose MRSA infection nor to guide or monitor treatment for MRSA infections.     Coagulation Studies: No results for input(s): LABPROT, INR in the last 72 hours.  Urinalysis: No results for input(s): COLORURINE, LABSPEC, PHURINE, GLUCOSEU, HGBUR, BILIRUBINUR, KETONESUR, PROTEINUR, UROBILINOGEN, NITRITE, LEUKOCYTESUR in the last 72 hours.  Invalid input(s): APPERANCEUR    Imaging: Dg Chest 1 View  09/02/2015  CLINICAL DATA:  PICC line adjustment. EXAM: CHEST 1 VIEW COMPARISON:  Same day. FINDINGS: Stable cardiomediastinal silhouette. Endotracheal nasogastric tubes are unchanged in position. Right internal jugular catheter is noted with distal tip in expected position of SVC. Left-sided PICC line has been adjusted with distal tip in expected position of SVC. No pneumothorax is noted. Moderate to large left pleural effusion is noted with probable adjacent atelectasis. Right lung is unremarkable. IMPRESSION: Right-sided PICC line has been adjusted with tip in expected position of SVC. Stable moderate to large left pleural effusion is noted with probable associated atelectasis. Right internal jugular catheter, endotracheal and nasogastric tubes are unchanged in position. Electronically Signed   By: Lupita Raider, M.D.   On: 09/02/2015 18:53   Dg Chest 1 View  09/02/2015  CLINICAL DATA:  PICC line placement. EXAM: CHEST 1 VIEW  COMPARISON:  09/02/2015 radiographs. FINDINGS: 1832 hours. New left arm PICC projects to the upper right atrial level, just inferior to the right IJ position. Endotracheal tube is unchanged with the tip in the mid trachea. A nasogastric tube projects below the diaphragm, tip not visualized. The heart size and mediastinal contours are stable. Left pleural effusion appears unchanged, likely partially loculated. There is associated partial left lung opacification. The right lung is clear. No evidence of pneumothorax. IMPRESSION: 1. Left arm PICC tip extends to the upper right atrial level.  2. The additional support system is stable. 3. Unchanged pleural parenchymal opacities in the left hemithorax. Electronically Signed   By: Carey Bullocks M.D.   On: 09/02/2015 18:44   Dg Chest 1 View  09/02/2015  CLINICAL DATA:  Shortness of breath tonight. EXAM: CHEST 1 VIEW COMPARISON:  Most recent radiograph yesterday at 0301 hour. Additional prior exams reviewed. FINDINGS: Tip of the right central line in the distal SVC. Increasing hazy opacity in the left lung consistent pleural effusion. This is progressed from prior exam. Progressive left airspace opacity. Minimal vascular congestion the right lung with suprahilar atelectasis. Cardiomediastinal contours are unchanged, partially obscured by pleural effusion. IMPRESSION: Progressive left pleural effusion and left-sided airspace opacity, pulmonary edema, atelectasis or pneumonia. Vascular congestion in the right lung again seen. Electronically Signed   By: Rubye Oaks M.D.   On: 09/02/2015 00:42   Dg Abd 1 View  09/02/2015  CLINICAL DATA:  OG tube placement. EXAM: ABDOMEN - 1 VIEW COMPARISON:  08/26/2015 FINDINGS: OG tube tip is in the midbody of the stomach. Bowel gas pattern is normal. No acute osseous abnormality. Double-lumen central venous catheter tip is at the L3 level in the inferior vena cava. IMPRESSION: OG tube in the body of the stomach. No change in the  central venous catheter. Normal bowel gas pattern. Electronically Signed   By: Francene Boyers M.D.   On: 09/02/2015 13:49   Dg Chest Port 1 View  09/02/2015  CLINICAL DATA:  Endotracheal and feeding tube placement. EXAM: PORTABLE CHEST 1 VIEW COMPARISON:  Earlier today at zero 2600 hours. FINDINGS: 1321 hours. Endotracheal tube terminates 3.2 cm above carina. Nasogastric tube extends beyond the inferior aspect of the film. Right internal jugular line tip at low SVC. Borderline cardiomegaly. Clear right lung. Similar appearance of left hemi thorax opacification secondary to pleural fluid and lower lobe predominant airspace disease. Residual aerated left upper lung remains. IMPRESSION: Appropriate position of endotracheal tube. Nasogastric tube extends beyond the inferior aspect of the film. Similar left-sided pleural fluid and airspace disease. Electronically Signed   By: Jeronimo Greaves M.D.   On: 09/02/2015 13:49     Medications:   . fentaNYL infusion INTRAVENOUS 10 mcg/hr (09/03/15 0800)  . norepinephrine 10 mcg/min (09/03/15 0800)  . propofol (DIPRIVAN) infusion 40.006 mcg/kg/min (09/03/15 0800)   . anidulafungin  100 mg Intravenous Q24H  . antiseptic oral rinse  7 mL Mouth Rinse QID  . chlorhexidine gluconate  15 mL Mouth Rinse BID  . heparin subcutaneous  5,000 Units Subcutaneous 3 times per day  . insulin aspart  0-5 Units Subcutaneous QHS  . insulin aspart  0-9 Units Subcutaneous TID WC  . ipratropium-albuterol  3 mL Nebulization Q4H  . pantoprazole (PROTONIX) IV  40 mg Intravenous Q2000  . piperacillin-tazobactam (ZOSYN)  IV  3.375 g Intravenous Q12H  . vancomycin  125 mg Oral 4 times per day  . [START ON 09/04/2015] vancomycin  1,000 mg Intravenous Q M,W,F-HD   acetaminophen **OR** acetaminophen, hydrALAZINE, Influenza vac split quadrivalent PF, ondansetron (ZOFRAN) IV, oxyCODONE, pneumococcal 23 valent vaccine, traZODone  Assessment/ Plan:  49 y.o. male With alcohol abuse,  schizophrenia, seizure disorder, coronary disease/MI 2012 was admitted to [facility] on November 4 after being found unresponsive in his apartment. Urine toxicology screen is positive for benzodiazepines and cannabinoids. Stool positive for C. Difficile  1. Acute renal failure, likely secondary to ATN from concurrent illness, volume depletion, C. difficile, rhabdomyolysis and sepsis. Has had 3 HD treatments. -Pt had  HD yesterday, no acute indication for HD today, will consider HD again tomorrow based upon electrolytes/UOP.  Good UOP at present.  2. Acute rhabdomyolysis M62.82: resolved, but had led to his ARF.  3.  Hypernatremia. Resolved with HD.  4.  Hypokalemia. Resolved, in fact K up to 5.1  5. Infective Colitis A09: c. Diff positive.  - now off of PO vancomycin as pt transitioned to IV vancomycin.  6.  Acute respiratory failure due to bacterial pneumonia:  Pt now on vent, on zosyn/vancomycin IV.     LOS: 13 Ross [name] 11/17/20169:54 AM

## 2015-09-03 NOTE — Discharge Summary (Signed)
Ross SpillersJames L [name], is a 49 y.o. male  DOB 1966/02/03  MRN [id number].  Admission date:  08/21/2015  Admitting Physician  Gale Journeyatherine P Walsh, MD  Discharge Date:  09/03/2015   Primary MD  No primary care provider on file.  Recommendations for primary care physician for things to follow:  Needs ongoing care for respiratory failure/renal failure and sepsis. Going to [facility].  Admission Diagnosis  Elevated LFTs [R79.89] History of alcohol abuse [F10.10] NSTEMI (non-ST elevated myocardial infarction) (HCC) [I21.4] Acute respiratory failure with hypoxia (HCC) [J96.01] History of seizure [Z86.69] Sepsis, due to unspecified organism [facility](HCC) [A41.9] Acute renal failure, unspecified acute renal failure type (HCC) [N17.9] Altered mental status, unspecified altered mental status type [R41.82]   Discharge Diagnosis  Elevated LFTs [R79.89] History of alcohol abuse [F10.10] NSTEMI (non-ST elevated myocardial infarction) (HCC) [I21.4] Acute respiratory failure with hypoxia (HCC) [J96.01] History of seizure [Z86.69] Sepsis, due to unspecified organism (HCC) [A41.9] Acute renal failure, unspecified acute renal failure type (HCC) [N17.9] Altered mental status, unspecified altered mental status type [R41.82]   Active Problems:   Sepsis (HCC)   Acute respiratory failure (HCC)   Enteritis due to Clostridium difficile   Rhabdomyolysis   Acute renal failure (HCC)   Acute respiratory failure with hypoxia (HCC)   Altered mental status      Past Medical History  Diagnosis Date  . Sleep apnea     had surgery to correct it  . Mental disorder     PTSD  . Seizures (HCC)     currently dx with seizures  . Cardiac arrest (HCC) 08/23/2011  . Pneumonia 09/04/2011  . Pancreatitis   . SOB (shortness of breath) 09/11/2011  . Hypertension   .  Anxiety   . Myocardial infarction (HCC)   . Schizophrenia (HCC)   . Rhabdomyolysis 08/23/2015  . Acute renal failure (HCC) 08/23/2015    Past Surgical History  Procedure Laterality Date  . Tonsillectomy    . Uvulopalatopharyngoplasty, tonsillectomy and septoplasty  06/16/2000  . Left heart catheterization with coronary angiogram N/A 08/24/2011    Procedure: LEFT HEART CATHETERIZATION WITH CORONARY ANGIOGRAM;  Surgeon: Corky CraftsJayadeep S Varanasi, MD;  [facility] C[facility];  Service: Cardiovascular;  Laterality: N/A;  . Intubation  08/26/2015            History of present illness and  Hospital Course:     Kindly see H&P for history of present illness and admission details, please review complete Labs, Consult reports and Test reports for all details in brief  HPI  from the history and physical done on the day of admission  49 year old male patient with history of alcohol abuse, schizophrenia, seizure disorder, coronary artery disease admitted for secondary to seizure. Family found him unresponsive. O2 sats 88% on 6 L so intubated in the emergency room and admitted to intensive care unit. Patient noted to have fever of 103 Fahrenheit, lactic acid 2.3 on admission.  Hospital Course  #1 acute respiratory failure with hypoxia: Patient to TMJ chest is not done on admission due to renal failure. Admitted in the care unit, pulmonary consult obtained for vent management. Initially failed a weaning secondary to encephalopathy. Neurology consult. Pain secondary to altered mental status. Patient had EEG showed no seizure activity.self d on nov 9thd moved to floor next day. And became hypoxic, tachypneic, intubated and transferred back to ICU. And had a bronchoscopy with bronchial alveolar lavage on November 8 it showed no organism. Patient to medical received IV antibiotics which  were stopped after negative BAL. 2.Acute renal failure due  To rhabdomyolysis; CK was  38,327 on admission. BUN was 38 creatinine  2.91 on admission, 4 sessions of hemodialysis 7 IV hydration and also for rhabdomyolysis. Patient is CK was down to 1414. But the renal function did not improve and a creatinine is stable at 4.78, BUN 76. Patient needs ongoing nephrology care at [facility] for further dialysis needs.  right femoral catheter for dialysis needs. #3 infective colitis patient is C. difficile is positive. Tolerating by mouth vancomycin. Patient condition deteriorated on Norma 16th with fever respiratory failure and chills. Patient transferred back to ICU and intubated. ID Dr. Sampson Goon for the patient and he recommended starting the vancomycin and Zosyn, Given for Candidemia Coverage. 4.septic shock at this time patient started on Levophed. His bili was 18.5 yesterday and it is down to 15.6 today. Patient is afebrile since starting IV antibiotics yesterday. #5 transaminitis secondary to rhabdomyolysis: Patient LFTs improved. #6 elevated troponins without EKG changes. 2-D echo showed normal ejection fraction. Seen by cardiology but did not have any further workup. 7.left leg edema.;DVT study negative. #8 schizophrenia, PTSD: Needs psychiatric consult after extubation and stable.  Disposition patient will go to [facility]  . Discharge Condition: critical    Follow UP      Discharge Instructions  and  Discharge Medications   Days check MAR for active medication list.     Medication List    ASK your doctor about these medications        acetaminophen 325 MG tablet  Commonly known as:  TYLENOL  Take 650 mg by mouth every 4 (four) hours as needed for mild pain or fever.     fluticasone 50 MCG/ACT nasal spray  Commonly known as:  FLONASE  Place 1-2 sprays into both nostrils at bedtime.     folic acid 1 MG tablet  Commonly known as:  FOLVITE  Take 1 mg by mouth daily.     Melatonin 3 MG Tabs  Take 3 mg by mouth at bedtime.     metFORMIN 500 MG tablet  Commonly known as:  GLUCOPHAGE  Take 500 mg by mouth  daily.     metroNIDAZOLE 500 MG tablet  Commonly known as:  FLAGYL  Take 1 tablet (500 mg total) by mouth 3 (three) times daily.     multivitamin with minerals Tabs tablet  Take 1 tablet by mouth daily.     naltrexone 50 MG tablet  Commonly known as:  DEPADE  Take 50 mg by mouth daily.     thiamine 100 MG tablet  Take 100 mg by mouth daily.     triamcinolone cream 0.1 %  Commonly known as:  KENALOG  Apply 1 application topically 2 (two) times daily as needed (for rash).          Diet and Activity recommendation: See Discharge Instructions above   Consults obtained -pulmonary ID Nephrology   Major procedures and Radiology Reports - PLEASE review detailed and final reports for all details, in brief -     Dg Chest 1 View  09/02/2015  CLINICAL DATA:  PICC line adjustment. EXAM: CHEST 1 VIEW COMPARISON:  Same day. FINDINGS: Stable cardiomediastinal silhouette. Endotracheal nasogastric tubes are unchanged in position. Right internal jugular catheter is noted with distal tip in expected position of SVC. Left-sided PICC line has been adjusted with distal tip in expected position of SVC. No pneumothorax is noted. Moderate to large left pleural effusion is noted with  probable adjacent atelectasis. Right lung is unremarkable. IMPRESSION: Right-sided PICC line has been adjusted with tip in expected position of SVC. Stable moderate to large left pleural effusion is noted with probable associated atelectasis. Right internal jugular catheter, endotracheal and nasogastric tubes are unchanged in position. Electronically Signed   By: Lupita Raider, M.D.   On: 09/02/2015 18:53   Dg Chest 1 View  09/02/2015  CLINICAL DATA:  PICC line placement. EXAM: CHEST 1 VIEW COMPARISON:  09/02/2015 radiographs. FINDINGS: 1832 hours. New left arm PICC projects to the upper right atrial level, just inferior to the right IJ position. Endotracheal tube is unchanged with the tip in the mid trachea. A  nasogastric tube projects below the diaphragm, tip not visualized. The heart size and mediastinal contours are stable. Left pleural effusion appears unchanged, likely partially loculated. There is associated partial left lung opacification. The right lung is clear. No evidence of pneumothorax. IMPRESSION: 1. Left arm PICC tip extends to the upper right atrial level. 2. The additional support system is stable. 3. Unchanged pleural parenchymal opacities in the left hemithorax. Electronically Signed   By: Carey Bullocks M.D.   On: 09/02/2015 18:44   Dg Chest 1 View  09/02/2015  CLINICAL DATA:  Shortness of breath tonight. EXAM: CHEST 1 VIEW COMPARISON:  Most recent radiograph yesterday at 0301 hour. Additional prior exams reviewed. FINDINGS: Tip of the right central line in the distal SVC. Increasing hazy opacity in the left lung consistent pleural effusion. This is progressed from prior exam. Progressive left airspace opacity. Minimal vascular congestion the right lung with suprahilar atelectasis. Cardiomediastinal contours are unchanged, partially obscured by pleural effusion. IMPRESSION: Progressive left pleural effusion and left-sided airspace opacity, pulmonary edema, atelectasis or pneumonia. Vascular congestion in the right lung again seen. Electronically Signed   By: Rubye Oaks M.D.   On: 09/02/2015 00:42   Dg Chest 1 View  09/01/2015  CLINICAL DATA:  Acute onset of sternal chest pain. Found unresponsive, with seizure like activity. Initial encounter. EXAM: CHEST 1 VIEW COMPARISON:  Chest radiograph performed 08/26/2015 FINDINGS: There is an increasing moderate left-sided pleural effusion, with left-sided airspace opacification, which may reflect asymmetric pulmonary edema or pneumonia. Underlying vascular congestion is noted. The right lung is otherwise clear. No pneumothorax is seen. The cardiomediastinal silhouette is borderline normal in size. The right IJ line is noted ending about the  distal SVC. No acute osseous abnormalities are seen. IMPRESSION: Increasing moderate left-sided pleural effusion, with left-sided airspace opacification, which may reflect asymmetric pulmonary edema or pneumonia. Underlying vascular congestion noted. Electronically Signed   By: Roanna Raider M.D.   On: 09/01/2015 03:39   Dg Chest 1 View  08/23/2015  CLINICAL DATA:  Central line placed on the right. EXAM: CHEST 1 VIEW COMPARISON:  08/21/2015. FINDINGS: Normal sized heart. Clear lungs. Endotracheal tube in satisfactory position. Nasogastric tube tip in the proximal stomach. Interval right jugular catheter with its tip in the upper right atrium or superior cavoatrial junction and. No pneumothorax. Unremarkable bones. IMPRESSION: 1. Right jugular catheter tip in the upper right atrium more superior cavoatrial junction without pneumothorax. 2. No acute abnormality. Electronically Signed   By: Beckie Salts M.D.   On: 08/23/2015 11:20   Dg Abd 1 View  09/02/2015  CLINICAL DATA:  OG tube placement. EXAM: ABDOMEN - 1 VIEW COMPARISON:  08/26/2015 FINDINGS: OG tube tip is in the midbody of the stomach. Bowel gas pattern is normal. No acute osseous abnormality. Double-lumen central venous catheter  tip is at the L3 level in the inferior vena cava. IMPRESSION: OG tube in the body of the stomach. No change in the central venous catheter. Normal bowel gas pattern. Electronically Signed   By: Francene Boyers M.D.   On: 09/02/2015 13:49   Dg Abd 1 View  08/26/2015  CLINICAL DATA:  Nasogastric tube placement EXAM: ABDOMEN - 1 VIEW COMPARISON:  07/21/2015 FINDINGS: Motion degraded study showing nasogastric tube tip at the proximal stomach. Dialysis catheter by right femoral approach, tip at the L2-3 disc level. Moderate gaseous distension of bowel with nonobstructive pattern. IMPRESSION: Nasogastric tube tip at the proximal stomach. Electronically Signed   By: Marnee Spring M.D.   On: 08/26/2015 05:47   Ct Head Wo  Contrast  08/21/2015  CLINICAL DATA:  Refractory seizures EXAM: CT HEAD WITHOUT CONTRAST TECHNIQUE: Contiguous axial images were obtained from the base of the skull through the vertex without intravenous contrast. COMPARISON:  Head CT August 24, 2011; brain MRI August 26, 2011 FINDINGS: The ventricles are normal in size and configuration. There is no intracranial mass, hemorrhage, extra-axial fluid collection, or midline shift. By CT, the gray-white compartments appear normal. The increased T2 signal in the periventricular white matter noted on prior MR is not appreciable as abnormality on this noncontrast enhanced CT. No acute infarct in particular is evident on this study. The bony calvarium appears intact. The mastoid air cells are clear. IMPRESSION: Study within normal limits. Electronically Signed   By: Bretta Bang III M.D.   On: 08/21/2015 13:33   US Venous Img Lower Unilateral Left  08/31/2015  CLINICAL DATA:  Recent history of fall, now with left lower extremity pain and edema for the past 3 weeks. History of smoking. Evaluate for DVT. EXAM: LEFT LOWER EXTREMITY VENOUS DOPPLER ULTRASOUND TECHNIQUE: Gray-scale sonography with graded compression, as well as color Doppler and duplex ultrasound were performed to evaluate the lower extremity deep venous systems from the level of the common femoral vein and including the common femoral, femoral, profunda femoral, popliteal and calf veins including the posterior tibial, peroneal and gastrocnemius veins when visible. The superficial great saphenous vein was also interrogated. Spectral Doppler was utilized to evaluate flow at rest and with distal augmentation maneuvers in the common femoral, femoral and popliteal veins. COMPARISON:  None. FINDINGS: Contralateral Common Femoral Vein: Respiratory phasicity is normal and symmetric with the symptomatic side. No evidence of thrombus. Normal compressibility. Common Femoral Vein: No evidence of thrombus. Normal  compressibility, respiratory phasicity and response to augmentation. Saphenofemoral Junction: No evidence of thrombus. Normal compressibility and flow on color Doppler imaging. Profunda Femoral Vein: No evidence of thrombus. Normal compressibility and flow on color Doppler imaging. Femoral Vein: No evidence of thrombus. Normal compressibility, respiratory phasicity and response to augmentation. Popliteal Vein: No evidence of thrombus. Normal compressibility, respiratory phasicity and response to augmentation. Calf Veins: No evidence of thrombus. Normal compressibility and flow on color Doppler imaging. Superficial Great Saphenous Vein: No evidence of thrombus. Normal compressibility and flow on color Doppler imaging. Venous Reflux:  None. Other Findings:  None. IMPRESSION: No evidence of DVT within the left lower extremity. Electronically Signed   By: Simonne Come M.D.   On: 08/31/2015 12:22   Dg Chest Port 1 View  09/02/2015  CLINICAL DATA:  Endotracheal and feeding tube placement. EXAM: PORTABLE CHEST 1 VIEW COMPARISON:  Earlier today at zero 2600 hours. FINDINGS: 1321 hours. Endotracheal tube terminates 3.2 cm above carina. Nasogastric tube extends beyond the inferior aspect of  the film. Right internal jugular line tip at low SVC. Borderline cardiomegaly. Clear right lung. Similar appearance of left hemi thorax opacification secondary to pleural fluid and lower lobe predominant airspace disease. Residual aerated left upper lung remains. IMPRESSION: Appropriate position of endotracheal tube. Nasogastric tube extends beyond the inferior aspect of the film. Similar left-sided pleural fluid and airspace disease. Electronically Signed   By: Jeronimo Greaves M.D.   On: 09/02/2015 13:49   Dg Chest Port 1 View  08/26/2015  CLINICAL DATA:  Self extubated EXAM: PORTABLE CHEST 1 VIEW COMPARISON:  Yesterday FINDINGS: Replaced endotracheal tube tip in good position between clavicular heads and carina. Right IJ central line  remains in good position, tip at the upper right atrium. An orogastric tube at least reaches the stomach. New hazy opacity at the bases with bandlike atelectasis along the right minor fissure. No overt edema. No effusion or pneumothorax. IMPRESSION: 1. Replaced endotracheal tube in good position. 2. New basilar lung opacities favor atelectasis or aspiration given the circumstances. Electronically Signed   By: Marnee Spring M.D.   On: 08/26/2015 05:46   Dg Chest Port 1 View  08/25/2015  CLINICAL DATA:  Respiratory failure, intubated. EXAM: PORTABLE CHEST 1 VIEW COMPARISON:  08/23/2015 FINDINGS: Endotracheal tube, NG tube and right central line remain in place, unchanged. Low lung volumes with increasing vascular congestion. No overt edema. Heart is upper limits normal in size. No visible effusions. IMPRESSION: Decreasing lung volumes with increasing vascular congestion. Electronically Signed   By: Charlett Nose M.D.   On: 08/25/2015 11:57   Dg Chest Portable 1 View  08/21/2015  CLINICAL DATA:  Status post intubation.  Seizure. EXAM: PORTABLE CHEST 1 VIEW COMPARISON:  Earlier today FINDINGS: Satisfactory position of the ET tube with tip above the carina. Heart size appears normal. No pleural effusion or edema. No airspace consolidation. IMPRESSION: 1. No complications after E ET tube placement. The tip is in satisfactory position above the carina. Electronically Signed   By: Signa Kell M.D.   On: 08/21/2015 15:20   Dg Chest Port 1 View  08/21/2015  CLINICAL DATA:  Unresponsive.  Seizure activity. EXAM: PORTABLE CHEST 1 VIEW COMPARISON:  09/11/2011 FINDINGS: The heart size is normal. There is no pleural effusion or edema. No airspace consolidation identified. The visualized bony structures appear intact. IMPRESSION: 1. No acute cardiopulmonary abnormalities. Electronically Signed   By: Signa Kell M.D.   On: 08/21/2015 13:51   Dg Abd Portable 1v  08/21/2015  CLINICAL DATA:  Bedside orogastric tube  placement. Patient found unresponsive by a maintenance person at his apartment complex. Multiple witnessed seizures during the ambulance tried to the hospital. EXAM: PORTABLE ABDOMEN - 1 VIEW COMPARISON:  CT abdomen and pelvis 09/03/2011. FINDINGS: Orogastric tube tip barely in the fundus of the stomach and could be advanced. Nonobstructive bowel gas pattern. Gas within normal caliber colon from cecum to rectum. Gas within normal caliber small bowel loops throughout the abdomen. An opaque foreign body (ring) projects over the right upper pelvis. Foley catheter noted within the urinary bladder. Phleboliths low in both sides of the pelvis. Degenerative changes involving the lower lumbar spine. IMPRESSION: 1. Orogastric tube tip just into the fundus of the stomach. This could be further advanced. 2. No acute abdominal abnormality. 3. Opaque foreign body (ring) projects over the right upper pelvis. If this is not in the patient's pocket, it may be in a loop of distal small bowel. Electronically Signed   By: Hulan Saas  M.D.   On: 08/21/2015 16:04   US Abdomen Limited Ruq  08/21/2015  CLINICAL DATA:  Elevated liver function tests. EXAM: US ABDOMEN LIMITED - RIGHT UPPER QUADRANT COMPARISON:  09/03/2011 FINDINGS: Gallbladder: No gallstones or wall thickening visualized. No sonographic Murphy sign noted. Common bile duct: Diameter: 3.2 mm Liver: Liver shows increased echogenicity and coarsened echotexture. No mass or focal lesion. Hepatopetal flow documented in the portal vein. IMPRESSION: 1. No acute finding.  Normal gallbladder.  No bile duct dilation. 2. Increased echogenicity of the liver consistent with fatty infiltration. This is similar to the prior ultrasound. Electronically Signed   By: Amie Portland M.D.   On: 08/21/2015 21:59    Micro Results     Recent Results (from the past 240 hour(s))  Culture, bal-quantitative     Status: None   Collection Time: 08/25/15  3:15 PM  Result Value Ref Range  Status   Specimen Description BRONCHIAL ALVEOLAR LAVAGE  Final   Special Requests NONE  Final   Gram Stain   Final    GOOD SPECIMEN - 80-90% WBCS FEW WBC SEEN NO ORGANISMS SEEN    Culture HOLDING FOR POSSIBLE PATHOGEN  Final   Report Status 08/27/2015 FINAL  Final  MRSA PCR Screening     Status: None   Collection Time: 09/02/15 10:27 PM  Result Value Ref Range Status   MRSA by PCR NEGATIVE NEGATIVE Final    Comment:        The GeneXpert MRSA Assay (FDA approved for NASAL specimens only), is one component of a comprehensive MRSA colonization surveillance program. It is not intended to diagnose MRSA infection nor to guide or monitor treatment for MRSA infections.   Culture, expectorated sputum-assessment     Status: None   Collection Time: 09/03/15 12:18 AM  Result Value Ref Range Status   Specimen Description SPUTUM  Final   Special Requests Normal  Final   Sputum evaluation THIS SPECIMEN IS ACCEPTABLE FOR SPUTUM CULTURE  Final   Report Status 09/03/2015 FINAL  Final       Today   Subjective:   Ross [name] is critically ill intubated, sedated. Patient is seen in the intensive care unit, he is going to [facility] for ongoing respiratory failure, renal failure.  Objective:   Blood pressure 99/61, pulse 76, temperature 97.7 F (36.5 C), temperature source Oral, resp. rate 20, height 5\' 11"  (1.803 m), weight 117 kg (257 lb 15 oz), SpO2 99 %.   Intake/Output Summary (Last 24 hours) at 09/03/15 1137 Last data filed at 09/03/15 0900  Gross per 24 hour  Intake 4644.54 ml  Output   2120 ml  Net 2524.54 ml    critcally ill, intubated, sedated , on pressors with Levophed. .AT,PERRAL Supple Neck,No JVD, No cervical lymphadenopathy appriciated.  Symmetrical Chest wall movement, Good air movement bilaterally, CTAB RRR,No Gallops,Rubs or new Murmurs, No Parasternal Heave +ve B.Sounds, Abd Soft, Non tender, No organomegaly appriciated, No rebound -guarding or  rigidity. No Cyanosis, bilateral edema present  Data Review   CBC w Diff: Lab Results  Component Value Date   WBC 15.6* 09/03/2015   HGB 10.0* 09/03/2015   HCT 31.7* 09/03/2015   PLT 199 09/03/2015   LYMPHOPCT 6% 08/23/2015   MONOPCT 8% 08/23/2015   EOSPCT 0% 08/23/2015   BASOPCT 0% 08/23/2015    CMP: Lab Results  Component Value Date   NA 144 09/03/2015   K 5.1 09/03/2015   CL 112* 09/03/2015   CO2 22 09/03/2015  BUN 68* 09/03/2015   CREATININE 4.87* 09/03/2015   PROT 5.4* 08/28/2015   ALBUMIN 2.0* 08/28/2015   BILITOT 1.0 08/28/2015   ALKPHOS 361* 08/28/2015   AST 474* 08/28/2015   ALT 270* 08/28/2015  .   Total Time in preparing paper work, data evaluation and todays exam - 35 minutes  Ross [name] M.D on 09/03/2015 at 11:37 AM    Note: This dictation was prepared with Dragon dictation along with smaller phrase technology. Any transcriptional errors that result from this process are unintentional.

## 2015-09-03 NOTE — Consult Note (Signed)
ANTIBIOTIC CONSULT NOTE - INITIAL  Pharmacy Consult for Vancomycin/Zosyn Indication: Sepsis  Allergies  Allergen Reactions  . Antihistamines, Diphenhydramine-Type Anaphylaxis and Rash  . Benadryl [Diphenhydramine Hcl] Anaphylaxis and Rash  . Hydroxyzine Anaphylaxis and Rash    Patient Measurements: Height:  (180.3 cm) Weight: 257 lb 15 oz (117 kg) IBW/kg (Calculated) : 75.3 Adjusted Body Weight:   Vital Signs: Temp: 97.7 F (36.5 C) (11/17 0900) Temp Source: Oral (11/17 0900) BP: 99/61 mmHg (11/17 1000) Pulse Rate: 76 (11/17 1000) Intake/Output from previous day: 11/16 0701 - 11/17 0700 In: 4430.4 [I.V.:3310.4; IV Piggyback:1120] Out: 1895 [Urine:1350] Intake/Output from this shift: Total I/O In: 214.1 [I.V.:214.1] Out: 225 [Urine:225]  Labs:  Recent Labs  09/01/15 0541 09/01/15 2131 09/02/15 0507 09/03/15 0807  WBC  --  18.5*  --  15.6*  HGB  --  11.2*  --  10.0*  PLT  --  173  --  199  CREATININE 4.78*  --  4.91* 4.87*   Estimated Creatinine Clearance: 24.4 mL/min (by C-G formula based on Cr of 4.87). No results for input(s): VANCOTROUGH, VANCOPEAK, VANCORANDOM, GENTTROUGH, GENTPEAK, GENTRANDOM, TOBRATROUGH, TOBRAPEAK, TOBRARND, AMIKACINPEAK, AMIKACINTROU, AMIKACIN in the last 72 hours.   Microbiology: Recent Results (from the past 720 hour(s))  Blood Culture (routine x 2)     Status: None   Collection Time: 08/21/15 12:47 PM  Result Value Ref Range Status   Specimen Description BLOOD UNKNOWN  Final   Special Requests BOTTLES DRAWN AEROBIC AND ANAEROBIC  1CC  Final   Culture NO GROWTH 10 DAYS  Final   Report Status 08/31/2015 FINAL  Final  Blood Culture (routine x 2)     Status: None   Collection Time: 08/21/15 12:47 PM  Result Value Ref Range Status   Specimen Description BLOOD LEFT AC  Final   Special Requests BOTTLES DRAWN AEROBIC AND ANAEROBIC  6CC  Final   Culture NO GROWTH 10 DAYS  Final   Report Status 08/31/2015 FINAL  Final  Urine  culture     Status: None   Collection Time: 08/21/15 12:47 PM  Result Value Ref Range Status   Specimen Description URINE, CATHETERIZED  Final   Special Requests Normal  Final   Culture NO GROWTH 2 DAYS  Final   Report Status 08/23/2015 FINAL  Final  MRSA PCR Screening     Status: None   Collection Time: 08/21/15 11:47 PM  Result Value Ref Range Status   MRSA by PCR NEGATIVE NEGATIVE Final    Comment:        The GeneXpert MRSA Assay (FDA approved for NASAL specimens only), is one component of a comprehensive MRSA colonization surveillance program. It is not intended to diagnose MRSA infection nor to guide or monitor treatment for MRSA infections.   C difficile quick scan w PCR reflex     Status: Abnormal   Collection Time: 08/22/15  2:31 PM  Result Value Ref Range Status   C Diff antigen POSITIVE (A) NEGATIVE Final   C Diff toxin POSITIVE (A) NEGATIVE Final   C Diff interpretation   Final    Positive for toxigenic C. difficile, active toxin production present.    Comment: CRITICAL RESULT CALLED TO, READ BACK BY AND VERIFIED WITH: CHARLIE FLEETWOOD,RN 08/22/2015 1544 BY J RAZZAKSUAREZ,MT   Culture, bal-quantitative     Status: None   Collection Time: 08/25/15  3:15 PM  Result Value Ref Range Status   Specimen Description BRONCHIAL ALVEOLAR LAVAGE  Final   Special  Requests NONE  Final   Gram Stain   Final    GOOD SPECIMEN - 80-90% WBCS FEW WBC SEEN NO ORGANISMS SEEN    Culture HOLDING FOR POSSIBLE PATHOGEN  Final   Report Status 08/27/2015 FINAL  Final  MRSA PCR Screening     Status: None   Collection Time: 09/02/15 10:27 PM  Result Value Ref Range Status   MRSA by PCR NEGATIVE NEGATIVE Final    Comment:        The GeneXpert MRSA Assay (FDA approved for NASAL specimens only), is one component of a comprehensive MRSA colonization surveillance program. It is not intended to diagnose MRSA infection nor to guide or monitor treatment for MRSA infections.   Culture,  expectorated sputum-assessment     Status: None   Collection Time: 09/03/15 12:18 AM  Result Value Ref Range Status   Specimen Description SPUTUM  Final   Special Requests Normal  Final   Sputum evaluation THIS SPECIMEN IS ACCEPTABLE FOR SPUTUM CULTURE  Final   Report Status 09/03/2015 FINAL  Final    Medical History: Past Medical History  Diagnosis Date  . Sleep apnea     had surgery to correct it  . Mental disorder     PTSD  . Seizures (HCC)     currently dx with seizures  . Cardiac arrest (HCC) 08/23/2011  . Pneumonia 09/04/2011  . Pancreatitis   . SOB (shortness of breath) 09/11/2011  . Hypertension   . Anxiety   . Myocardial infarction (HCC)   . Schizophrenia (HCC)   . Rhabdomyolysis 08/23/2015  . Acute renal failure (HCC) 08/23/2015    Medications:  Scheduled:  . anidulafungin  100 mg Intravenous Q24H  . antiseptic oral rinse  7 mL Mouth Rinse QID  . chlorhexidine gluconate  15 mL Mouth Rinse BID  . heparin subcutaneous  5,000 Units Subcutaneous 3 times per day  . insulin aspart  0-5 Units Subcutaneous QHS  . insulin aspart  0-9 Units Subcutaneous TID WC  . ipratropium-albuterol  3 mL Nebulization Q4H  . pantoprazole (PROTONIX) IV  40 mg Intravenous Q2000  . piperacillin-tazobactam (ZOSYN)  IV  3.375 g Intravenous Q12H  . vancomycin  125 mg Oral 4 times per day  . [START ON 09/04/2015] vancomycin  1,000 mg Intravenous Q M,W,F-HD   Assessment: Pt is a 49 year old male with hypoxia, worsening renal failure requiring HD, sepsis, rhabdo, c.diff, seizures, treated with vanc (11/4-11/10) and zosyn 11/4-11/6 and po vanc 11/5 to current.   Goal of Therapy:  Vancomycin pre-HD level: 15-25 mcg/ml  Plan:  Will continue vancomycin 1000 mg iv qHD. Will need to f/u HD schedule as patient did not tolerate HD yesterday and may require dialysis outside of his regular MWF schedule. No plans for HD today per renal note. Will tentatively order a vancomycin level with the expected  third dialysis session on 11/23.   Luisa Harthristy, Param Capri D 09/03/2015,11:31 AM

## 2015-09-03 NOTE — Consult Note (Signed)
[facility] Pulmonary Medicine Consultation      Name: CLARA SMOLEN MRN: [id number] DOB: 10-26-1965    ADMISSION DATE:  08/21/2015   CHIEF COMPLAINT:    acute resp failure   SUBJECTIVE   Remains intubated,sedated fio2 40% On vasopressors  SIGNIFICANT EVENTS   11/4 intubated 11/5 failed vent wean due to encephalopathy and acidosis 11/6 RIJ CVL, R Fem Vein VasCath 11/8 bronchoscopy with BAL 11/9 self extubated 11/15 transferred back to ICU 11/15 intubated  Review of Systems  Unable to perform ROS: critical illness      VITAL SIGNS    Temp:  [97.5 F (36.4 C)-101.7 F (38.7 C)] 98.9 F (37.2 C) (11/17 0400) Pulse Rate:  [64-130] 83 (11/17 0800) Resp:  [16-43] 19 (11/17 0800) BP: (62-174)/(45-119) 89/58 mmHg (11/17 0800) SpO2:  [90 %-100 %] 100 % (11/17 0800) FiO2 (%):  [40 %-80 %] 40 % (11/17 0800) Weight:  [249 lb 1.9 oz (113 kg)-259 lb 14.8 oz (117.9 kg)] 257 lb 15 oz (117 kg) (11/17 0500) HEMODYNAMICS:   VENTILATOR SETTINGS: Vent Mode:  [-] PRVC FiO2 (%):  [40 %-80 %] 40 % Set Rate:  [20 bmp] 20 bmp Vt Set:  [500 mL] 500 mL PEEP:  [5 cmH20-12 cmH20] 5 cmH20 Plateau Pressure:  [23 cmH20] 23 cmH20 INTAKE / OUTPUT:  Intake/Output Summary (Last 24 hours) at 09/03/15 0827 Last data filed at 09/03/15 0600  Gross per 24 hour  Intake 4402.14 ml  Output   1895 ml  Net 2507.14 ml       PHYSICAL EXAM   Physical Exam  Constitutional: He appears distressed.  HENT:  Head: Normocephalic and atraumatic.  Eyes: Pupils are equal, round, and reactive to light. No scleral icterus.  Neck: Normal range of motion. Neck supple.  Cardiovascular: Normal rate and regular rhythm.   No murmur heard. Pulmonary/Chest: He is in respiratory distress. He has wheezes. He has rales.  Extubated, still with coarse BS, but improving  Abdominal: Soft.  Musculoskeletal: He exhibits no edema.  Neurological: He is alert.  [id number]  Skin: Skin is warm. He is  diaphoretic.       LABS   LABS:  CBC  Recent Labs Lab 08/28/15 0500 08/28/15 1632 09/01/15 2131  WBC 17.4* 21.5* 18.5*  HGB 12.3* 13.1 11.2*  HCT 37.7* 38.9* 34.2*  PLT 145* 167 173   Coag's No results for input(s): APTT, INR in the last 168 hours. BMET  Recent Labs Lab 08/31/15 0622 09/01/15 0541 09/02/15 0507  NA 146* 144 146*  K 3.8 4.0 4.2  CL 120* 119* 118*  CO2 21* 19* 17*  BUN 83* 76* 77*  CREATININE 4.85* 4.78* 4.91*  GLUCOSE 109* 89 100*   Electrolytes  Recent Labs Lab 08/31/15 0622 09/01/15 0541 09/02/15 0507  CALCIUM 8.5* 8.6* 8.6*   Sepsis Markers No results for input(s): LATICACIDVEN, PROCALCITON, O2SATVEN in the last 168 hours. ABG  Recent Labs Lab 09/02/15 1555  PHART 7.29*  PCO2ART 46  PO2ART 126*   Liver Enzymes  Recent Labs Lab 08/28/15 0500  AST 474*  ALT 270*  ALKPHOS 361*  BILITOT 1.0  ALBUMIN 2.0*   Cardiac Enzymes No results for input(s): TROPONINI, PROBNP in the last 168 hours. Glucose  Recent Labs Lab 09/01/15 2120 09/02/15 0731 09/02/15 1442 09/02/15 1653 09/02/15 2149 09/03/15 0540  GLUCAP 84 89 124* 96 105* 116*     Recent Results (from the past 240 hour(s))  Culture, bal-quantitative     Status: None  Collection Time: 08/25/15  3:15 PM  Result Value Ref Range Status   Specimen Description BRONCHIAL ALVEOLAR LAVAGE  Final   Special Requests NONE  Final   Gram Stain   Final    GOOD SPECIMEN - 80-90% WBCS FEW WBC SEEN NO ORGANISMS SEEN    Culture HOLDING FOR POSSIBLE PATHOGEN  Final   Report Status 08/27/2015 FINAL  Final  MRSA PCR Screening     Status: None   Collection Time: 09/02/15 10:27 PM  Result Value Ref Range Status   MRSA by PCR NEGATIVE NEGATIVE Final    Comment:        The GeneXpert MRSA Assay (FDA approved for NASAL specimens only), is one component of a comprehensive MRSA colonization surveillance program. It is not intended to diagnose MRSA infection nor to guide  or monitor treatment for MRSA infections.      Current facility-administered medications:  .  acetaminophen (TYLENOL) tablet 650 mg, 650 mg, Oral, Q6H PRN, 650 mg at 09/02/15 1231 **OR** acetaminophen (TYLENOL) suppository 650 mg, 650 mg, Rectal, Q6H PRN, Vishal Mungal, MD .  anidulafungin (ERAXIS) 100 mg in sodium chloride 0.9 % 100 mL IVPB, 100 mg, Intravenous, Q24H, Clydie Braun, MD .  antiseptic oral rinse solution (CORINZ), 7 mL, Mouth Rinse, QID, Erin Fulling, MD, 7 mL at 09/03/15 0405 .  chlorhexidine gluconate (PERIDEX) 0.12 % solution 15 mL, 15 mL, Mouth Rinse, BID, Erin Fulling, MD, 15 mL at 09/03/15 0800 .  fentaNYL in NS (40mcg/ml) infusion-PREMIX, 10 mcg/hr, Intravenous, Continuous, Erin Fulling, MD, Last Rate: 1 mL/hr at 09/03/15 0743, 10 mcg/hr at 09/03/15 0743 .  heparin injection 5,000 Units, 5,000 Units, Subcutaneous, 3 times per day, Katha Hamming, MD, 5,000 Units at 09/03/15 0527 .  hydrALAZINE (APRESOLINE) injection 10 mg, 10 mg, Intravenous, Q4H PRN, Oralia Manis, MD .  Influenza vac split quadrivalent PF (FLUARIX) injection 0.5 mL, 0.5 mL, Intramuscular, Prior to discharge, Katha Hamming, MD .  insulin aspart (novoLOG) injection 0-5 Units, 0-5 Units, Subcutaneous, QHS, Gale Journey, MD, 1 Units at 08/29/15 1834 .  insulin aspart (novoLOG) injection 0-9 Units, 0-9 Units, Subcutaneous, TID WC, Gale Journey, MD, 1 Units at 08/30/15 1216 .  ipratropium-albuterol (DUONEB) 0.5-2.5 (3) MG/3ML nebulizer solution 3 mL, 3 mL, Nebulization, Q4H, Erin Fulling, MD, 3 mL at 09/03/15 0746 .  norepinephrine (LEVOPHED) 4mg  in D5W premix infusion, 0-40 mcg/min, Intravenous, Titrated, Erin Fulling, MD, Last Rate: 37.5 mL/hr at 09/03/15 0545, 10 mcg/min at 09/03/15 0545 .  ondansetron (ZOFRAN) injection 4 mg, 4 mg, Intravenous, Q8H PRN, Vishal Mungal, MD, 4 mg at 08/28/15 1052 .  oxyCODONE (Oxy IR/ROXICODONE) immediate release tablet 5 mg, 5 mg, Oral,  Q6H PRN, Oralia Manis, MD, 5 mg at 09/01/15 2125 .  pantoprazole (PROTONIX) injection 40 mg, 40 mg, Intravenous, Q2000, Erin Fulling, MD, 40 mg at 09/02/15 2001 .  piperacillin-tazobactam (ZOSYN) IVPB 3.375 g, 3.375 g, Intravenous, Q12H, Katha Hamming, MD, 3.375 g at 09/03/15 0048 .  pneumococcal 23 valent vaccine (PNU-IMMUNE) injection 0.5 mL, 0.5 mL, Intramuscular, Prior to discharge, Katha Hamming, MD .  propofol (DIPRIVAN) 1000 MG/100ML infusion, 5-80 mcg/kg/min, Intravenous, Titrated, Erin Fulling, MD, Last Rate: 28.3 mL/hr at 09/03/15 0743, 40 mcg/kg/min at 09/03/15 0743 .  traZODone (DESYREL) tablet 25 mg, 25 mg, Oral, QHS PRN, Oralia Manis, MD, 25 mg at 09/02/15 0047 .  vancomycin (VANCOCIN) 50 mg/mL oral solution 125 mg, 125 mg, Oral, 4 times per day, Vishal Mungal, MD, 125 mg at  09/03/15 0527 .  [START ON 09/04/2015] vancomycin (VANCOCIN) IVPB 1000 mg/200 mL premix, 1,000 mg, Intravenous, Q M,W,F-HD, Katha Hamming, MD  IMAGING    Dg Chest 1 View  09/02/2015  CLINICAL DATA:  PICC line adjustment. EXAM: CHEST 1 VIEW COMPARISON:  Same day. FINDINGS: Stable cardiomediastinal silhouette. Endotracheal nasogastric tubes are unchanged in position. Right internal jugular catheter is noted with distal tip in expected position of SVC. Left-sided PICC line has been adjusted with distal tip in expected position of SVC. No pneumothorax is noted. Moderate to large left pleural effusion is noted with probable adjacent atelectasis. Right lung is unremarkable. IMPRESSION: Right-sided PICC line has been adjusted with tip in expected position of SVC. Stable moderate to large left pleural effusion is noted with probable associated atelectasis. Right internal jugular catheter, endotracheal and nasogastric tubes are unchanged in position. Electronically Signed   By: Lupita Raider, M.D.   On: 09/02/2015 18:53   Dg Chest 1 View  09/02/2015  CLINICAL DATA:  PICC line placement. EXAM: CHEST 1 VIEW  COMPARISON:  09/02/2015 radiographs. FINDINGS: 1832 hours. New left arm PICC projects to the upper right atrial level, just inferior to the right IJ position. Endotracheal tube is unchanged with the tip in the mid trachea. A nasogastric tube projects below the diaphragm, tip not visualized. The heart size and mediastinal contours are stable. Left pleural effusion appears unchanged, likely partially loculated. There is associated partial left lung opacification. The right lung is clear. No evidence of pneumothorax. IMPRESSION: 1. Left arm PICC tip extends to the upper right atrial level. 2. The additional support system is stable. 3. Unchanged pleural parenchymal opacities in the left hemithorax. Electronically Signed   By: Carey Bullocks M.D.   On: 09/02/2015 18:44   Dg Abd 1 View  09/02/2015  CLINICAL DATA:  OG tube placement. EXAM: ABDOMEN - 1 VIEW COMPARISON:  08/26/2015 FINDINGS: OG tube tip is in the midbody of the stomach. Bowel gas pattern is normal. No acute osseous abnormality. Double-lumen central venous catheter tip is at the L3 level in the inferior vena cava. IMPRESSION: OG tube in the body of the stomach. No change in the central venous catheter. Normal bowel gas pattern. Electronically Signed   By: Francene Boyers M.D.   On: 09/02/2015 13:49   Dg Chest Port 1 View  09/02/2015  CLINICAL DATA:  Endotracheal and feeding tube placement. EXAM: PORTABLE CHEST 1 VIEW COMPARISON:  Earlier today at zero 2600 hours. FINDINGS: 1321 hours. Endotracheal tube terminates 3.2 cm above carina. Nasogastric tube extends beyond the inferior aspect of the film. Right internal jugular line tip at low SVC. Borderline cardiomegaly. Clear right lung. Similar appearance of left hemi thorax opacification secondary to pleural fluid and lower lobe predominant airspace disease. Residual aerated left upper lung remains. IMPRESSION: Appropriate position of endotracheal tube. Nasogastric tube extends beyond the inferior  aspect of the film. Similar left-sided pleural fluid and airspace disease. Electronically Signed   By: Jeronimo Greaves M.D.   On: 09/02/2015 13:49      Indwelling Urinary Catheter continued, requirement due to   Reason to continue Indwelling Urinary Catheter for strict Intake/Output monitoring for hemodynamic instability         Ventilator continued, requirement due to, resp failure    Ventilator Sedation RASS 0 to -2   MAJOR EVENTS/TEST RESULTS: 11/4 intubated 11/5 failed vent wean 11/8 bronchoscopy 11/9 self extubated 11/16 intubated 11/16 CXR c/w Left sided opacity likely pnuemonia  INDWELLING DEVICES:: 11/6  RIJ CVL, R Fem Vein VasCath>>  MICRO DATA: MRSA PCR >>neg Urine 11/4>>negative Blood x 2 11/4>>No growth to date Resp BAL 11/8>>neg FLU H1N1>> NEG c diff + (antigend and toxin) 11/5  ANTIMICROBIALS:  Oral vanc>>11/5 Flagy>>11/5 Vancomycin/Zosyn 11/16    ASSESSMENT/PLAN   49 yo AAM with acute resp failure with acute encephalopathy from acute sepsis from acute recurrent c diff colitis With acute renal failure from rhabdomyolysis with acute drug abuse with etoh abuse  Transferred back to ICU for acute resp failure from sepsis and pneumonia left sided  PULMONARY- 1.Respiratory Failure -continue Full MV support -continue Bronchodilator Therapy -Wean Fio2 and PEEP as tolerated Follow up ABg and CXR as needed -will obtain sputum culture  CARDIOVASCULAR -blood pressure stable Vasopressors if needed-keep MAP>65   RENAL ATN from Rhabdomyolysis - ck's trending down Acute renal failure from ATN -currently on intermittent HD -appreciate nephro input  GASTROINTESTINAL GI prophylaxis  HEMATOLOGIC Follow CBC  INFECTIOUS + cdiff and HCAP - vancomycin and flagyl -pneumonia-start zosyn -obtain blood cultures and sputum culture- -started IV vanc and sosyn  ENDOCRINE - ICU hypoglycemic\Hyperglycemia protocol   NEUROLOGIC - intubated and sedated -  minimal sedation to achieve a RASS goal: -1  I have personally obtained a history, examined the patient, evaluated Pertinent laboratory and RadioGraphic/imaging results, and  formulated the assessment and plan The Patient requires high complexity decision making for assessment and support, frequent evaluation and titration of therapies, application of advanced monitoring technologies and extensive interpretation of multiple databases. Critical Care Time devoted to patient care services described in this note is 35 minutes.   Overall, patient is critically ill, prognosis is guarded.    LTACH REFERRAL. OK TO TRANSFER TO LTACH IF BED AVAILABLE   Lucie LeatherKurian David Nic Lampe, M.D.  [facility]  Medical Director [facility] Medical Director [facility] Card[facility]

## 2015-09-03 NOTE — Progress Notes (Signed)
[facility] - [place] at [facility]   PATIENT NAME: Ross [name]    MR#:  [id number]  DATE OF BIRTH:  1966-08-16  SUBJECTIVE; patient is intubated and sedated at this time started on Levophed drip because of hypotension. Patient mother refused transfer to [facility]. He is on fentanyl for evaluation. No further fever..   CHIEF COMPLAINT:   Chief Complaint  Patient presents with  . Seizures     REVIEW OF SYSTEMS:   Review of Systems  Unable to perform ROS: intubated  Constitutional: Negative for fever and chills.  HENT: Negative for hearing loss.   Eyes: Negative for blurred vision, double vision and photophobia.  Respiratory: Negative for cough, hemoptysis, sputum production and wheezing.   Cardiovascular: Negative for chest pain, palpitations, orthopnea and leg swelling.  Gastrointestinal: Negative for nausea, vomiting, abdominal pain and diarrhea.  Genitourinary: Negative for dysuria and urgency.  Musculoskeletal: Negative for neck pain.  Skin: Negative for rash.  Neurological: Negative for dizziness, focal weakness, seizures, weakness and headaches.  Psychiatric/Behavioral: Negative for memory loss. The patient does not have insomnia.     Have anxiety, PTSD issues. Received some Haldol for hallucinations. DRUG ALLERGIES:   Allergies  Allergen Reactions  . Antihistamines, Diphenhydramine-Type Anaphylaxis and Rash  . Benadryl [Diphenhydramine Hcl] Anaphylaxis and Rash  . Hydroxyzine Anaphylaxis and Rash    VITALS:  Blood pressure 102/66, pulse 85, temperature 97.7 F (36.5 C), temperature source Oral, resp. rate 20, height  (1.803 m), weight 117 kg (257 lb 15 oz), SpO2 100 %.  PHYSICAL EXAMINATION:  GENERAL:  49 y.o.-year-old patient   seen in the room, currently intubated, sedated.  PERLA; ENT;orally intubated.  Pulmonary ; his breath sounds at bases.   CARDIOVASCULAR: S1, S2 normal. No murmurs, rubs, or gallops. Tachycardic today  with the heart rate 1 27 bpm. ABDOMEN: Soft, nontender, nondistended. Bowel sounds present. No organomegaly or mass.  EXTREMITIES: left leg edema. cyanosis, or clubbing. Peripheral pulses 2+ NEUROLOGIC: intubated/sedated.  SKIN: No obvious rash, lesion, or ulcer.  Bilateral leg edema  Psych;.'intubated/sedated.  LABORATORY PANEL:   CBC  Recent Labs Lab 09/03/15 0807  WBC 15.6*  HGB 10.0*  HCT 31.7*  PLT 199   ------------------------------------------------------------------------------------------------------------------  Chemistries   Recent Labs Lab 08/28/15 0500  09/03/15 0807  NA 145  < > 144  K 3.6  < > 5.1  CL 115*  < > 112*  CO2 21*  < > 22  GLUCOSE 152*  < > 127*  BUN 70*  < > 68*  CREATININE 4.72*  < > 4.87*  CALCIUM 8.4*  < > 8.1*  AST 474*  --   --   ALT 270*  --   --   ALKPHOS 361*  --   --   BILITOT 1.0  --   --   < > = values in this interval not displayed. ------------------------------------------------------------------------------------------------------------------  Cardiac Enzymes No results for input(s): TROPONINI in the last 168 hours. ------------------------------------------------------------------------------------------------------------------  RADIOLOGY:  Dg Chest 1 View  09/02/2015  CLINICAL DATA:  PICC line adjustment. EXAM: CHEST 1 VIEW COMPARISON:  Same day. FINDINGS: Stable cardiomediastinal silhouette. Endotracheal nasogastric tubes are unchanged in position. Right internal jugular catheter is noted with distal tip in expected position of SVC. Left-sided PICC line has been adjusted with distal tip in expected position of SVC. No pneumothorax is noted. Moderate to large left pleural effusion is noted with probable adjacent atelectasis. Right lung is unremarkable. IMPRESSION: Right-sided PICC line has  been adjusted with tip in expected position of SVC. Stable moderate to large left pleural effusion is noted with probable associated  atelectasis. Right internal jugular catheter, endotracheal and nasogastric tubes are unchanged in position. Electronically Signed   By: Lupita Raider, M.D.   On: 09/02/2015 18:53   Dg Chest 1 View  09/02/2015  CLINICAL DATA:  PICC line placement. EXAM: CHEST 1 VIEW COMPARISON:  09/02/2015 radiographs. FINDINGS: 1832 hours. New left arm PICC projects to the upper right atrial level, just inferior to the right IJ position. Endotracheal tube is unchanged with the tip in the mid trachea. A nasogastric tube projects below the diaphragm, tip not visualized. The heart size and mediastinal contours are stable. Left pleural effusion appears unchanged, likely partially loculated. There is associated partial left lung opacification. The right lung is clear. No evidence of pneumothorax. IMPRESSION: 1. Left arm PICC tip extends to the upper right atrial level. 2. The additional support system is stable. 3. Unchanged pleural parenchymal opacities in the left hemithorax. Electronically Signed   By: Carey Bullocks M.D.   On: 09/02/2015 18:44   Dg Chest 1 View  09/02/2015  CLINICAL DATA:  Shortness of breath tonight. EXAM: CHEST 1 VIEW COMPARISON:  Most recent radiograph yesterday at 0301 hour. Additional prior exams reviewed. FINDINGS: Tip of the right central line in the distal SVC. Increasing hazy opacity in the left lung consistent pleural effusion. This is progressed from prior exam. Progressive left airspace opacity. Minimal vascular congestion the right lung with suprahilar atelectasis. Cardiomediastinal contours are unchanged, partially obscured by pleural effusion. IMPRESSION: Progressive left pleural effusion and left-sided airspace opacity, pulmonary edema, atelectasis or pneumonia. Vascular congestion in the right lung again seen. Electronically Signed   By: Rubye Oaks M.D.   On: 09/02/2015 00:42   Dg Abd 1 View  09/02/2015  CLINICAL DATA:  OG tube placement. EXAM: ABDOMEN - 1 VIEW COMPARISON:   08/26/2015 FINDINGS: OG tube tip is in the midbody of the stomach. Bowel gas pattern is normal. No acute osseous abnormality. Double-lumen central venous catheter tip is at the L3 level in the inferior vena cava. IMPRESSION: OG tube in the body of the stomach. No change in the central venous catheter. Normal bowel gas pattern. Electronically Signed   By: Francene Boyers M.D.   On: 09/02/2015 13:49   Dg Chest Port 1 View  09/02/2015  CLINICAL DATA:  Endotracheal and feeding tube placement. EXAM: PORTABLE CHEST 1 VIEW COMPARISON:  Earlier today at zero 2600 hours. FINDINGS: 1321 hours. Endotracheal tube terminates 3.2 cm above carina. Nasogastric tube extends beyond the inferior aspect of the film. Right internal jugular line tip at low SVC. Borderline cardiomegaly. Clear right lung. Similar appearance of left hemi thorax opacification secondary to pleural fluid and lower lobe predominant airspace disease. Residual aerated left upper lung remains. IMPRESSION: Appropriate position of endotracheal tube. Nasogastric tube extends beyond the inferior aspect of the film. Similar left-sided pleural fluid and airspace disease. Electronically Signed   By: Jeronimo Greaves M.D.   On: 09/02/2015 13:49    EKG:   Orders placed or performed during the hospital encounter of 08/21/15  . EKG 12-Lead  . EKG 12-Lead    ASSESSMENT AND PLAN:   #1 -  #2 acute respiratory failure with hypoxia and altered mental status: ` Recurrent acute respiratory failure right now has pneumonia: Patient started on vancomycin, Zosyn. Seen by Dr. Sampson Goon. Continue full vent support. Follow daily x-rays, sputum cultures.  Septic shock  secondary to pneumonia and also possible line sepsis: Patient is on IV Vanco, Zosyn, caspofungin for  Possible fungemia, Continue levaphed - #3 Rhabdomyolysis: CK trending down,continue to monitor. Stopped  IV hydration. - Possibly due to prolonged immobilization, hypotension, diarrhea, possible withdrawal  seizures prior to admission.     P#4 acute renal failure: secondary to ATN from one depletion, rhabdomyolysis, C. Difficile. Patient renal function is worsening today , Pulmonary  edema, shortness of breath,, and renal failure: Patient needs hemodialysis, appreciate nephrology help.  A#5 thrombocytopenia: Improving - Possibly due to sepsis in the setting of C. difficile infection - patient also with history of heavy ETOH use. - stable, no bleeding noted  #6 elevated troponin - No concerning EKG changes, no events on telemetry.  - - 2-D echocardiogram is normal with no focal wall motion abnormalities - Likely related to rhabdo   #7 altered mental status; now intubated, sedated. y  #8 transaminitis: improving,LFTS trending down. - Viral hepatitis panel negative - Some of the AST likely due to rhabdomyolysis - Likely shock liver as well as possible ETOH hepatitis  10.left leg edema;dvt study is negative.   #11 ,aniety, mental disorder with PTSD: .schizophrenia;consult psych when extubated.  [facility]. All the records are reviewed and case discussed with Care Management/Social Workerr. Management plans discussed with the patient, family and they are in agreement.  CODE STATUS: Full   TOTAL CRITICAL CARE TIME TAKING CARE OF THIS PATIENT: 35 minutes.  CCT today  POSSIBLE D/C  3-4DAYS, DEPENDING ON CLINICAL CONDITION.   Katha HammingKONIDENA,Leniya Breit M.D on 09/03/2015 at 3:30 PM  Between 7am to 6pm - Pager - [phone]  After 6pm go to [email or web address] - password [email or web address] [facility]  [facility]  Office  [phone]  CC: Primary care physician; No primary care provider on file.

## 2015-09-03 NOTE — Plan of Care (Signed)
Problem: Fluid Volume: Goal: Hemodynamic stability will improve Outcome: Not Progressing Still requires levophed for BP maintenance

## 2015-09-04 ENCOUNTER — Inpatient Hospital Stay: Payer: Medicare Other

## 2015-09-04 LAB — BODY FLUID CELL COUNT WITH DIFFERENTIAL
Eos, Fluid: 2 %
LYMPHS FL: 10 %
Monocyte-Macrophage-Serous Fluid: 1 %
Neutrophil Count, Fluid: 87 %
OTHER CELLS FL: 0 %
Total Nucleated Cell Count, Fluid: 3087 cu mm

## 2015-09-04 LAB — PROTEIN, BODY FLUID: Total protein, fluid: 3.7 g/dL

## 2015-09-04 LAB — GLUCOSE, CAPILLARY
GLUCOSE-CAPILLARY: 104 mg/dL — AB (ref 65–99)
GLUCOSE-CAPILLARY: 75 mg/dL (ref 65–99)
GLUCOSE-CAPILLARY: 88 mg/dL (ref 65–99)
GLUCOSE-CAPILLARY: 83 mg/dL (ref 65–99)
Glucose-Capillary: 76 mg/dL (ref 65–99)
Glucose-Capillary: 99 mg/dL (ref 65–99)

## 2015-09-04 LAB — BASIC METABOLIC PANEL
ANION GAP: 9 (ref 5–15)
BUN: 66 mg/dL — AB (ref 6–20)
CHLORIDE: 112 mmol/L — AB (ref 101–111)
CO2: 22 mmol/L (ref 22–32)
CREATININE: 4.92 mg/dL — AB (ref 0.61–1.24)
Calcium: 8.1 mg/dL — ABNORMAL LOW (ref 8.9–10.3)
GFR, EST AFRICAN AMERICAN: 15 mL/min — AB (ref >60)
GFR, EST NON AFRICAN AMERICAN: 13 mL/min — AB (ref >60)
GLUCOSE: 96 mg/dL (ref 65–99)
POTASSIUM: 4.8 mmol/L (ref 3.5–5.1)
Sodium: 143 mmol/L (ref 135–145)

## 2015-09-04 LAB — LACTATE DEHYDROGENASE, PLEURAL OR PERITONEAL FLUID: LD FL: 432 U/L — AB (ref 3–23)

## 2015-09-04 LAB — PATHOLOGIST SMEAR REVIEW

## 2015-09-04 MED ORDER — PRO-STAT SUGAR FREE PO LIQD
30.0000 mL | ORAL | Status: DC
  Administered 2015-09-04 – 2015-09-07 (×17): 30 mL via ORAL

## 2015-09-04 NOTE — Progress Notes (Signed)
Subjective:  Pt seen at bedside. Still on the vent. Good UOP noted.    Objective:  Vital signs in last 24 hours:  Temp:  [97 F (36.1 C)-98.9 F (37.2 C)] 97 F (36.1 C) (11/18 0800) Pulse Rate:  [72-98] 78 (11/18 0800) Resp:  [8-22] 20 (11/18 0800) BP: (94-116)/(56-74) 112/67 mmHg (11/18 0800) SpO2:  [98 %-100 %] 100 % (11/18 0800) FiO2 (%):  [40 %] 40 % (11/18 0800) Weight:  [114.5 kg (252 lb 6.8 oz)] 114.5 kg (252 lb 6.8 oz) (11/18 0400)  Weight change: -3.4 kg (-7 lb 7.9 oz) Filed Weights   09/02/15 1400 09/03/15 0500 09/04/15 0400  Weight: 113 kg (249 lb 1.9 oz) 117 kg (257 lb 15 oz) 114.5 kg (252 lb 6.8 oz)    Intake/Output:    Intake/Output Summary (Last 24 hours) at 09/04/15 0900 Last data filed at 09/04/15 0800  Gross per 24 hour  Intake 3053.74 ml  Output   1940 ml  Net 1113.74 ml     Physical Exam: General:  critically ill appearing.  HEENT  [place]/AT ETT in place  Neck  supple  Pulm/lungs  bilateral rhonchi, vent assisted  CVS/Heart  S1S2 no rubs  Abdomen:   soft, +bowel sounds, nontender  Extremities:  1+ b/l LE edema  Neurologic:  on the vent, sedated.  Skin:  no acute rashes  Access: Right femoral non tunneled catheter Dr. Belia Heman 11/6       Basic Metabolic Panel:   Recent Labs Lab 08/30/15 0643 08/31/15 0622 09/01/15 0541 09/02/15 0507 09/03/15 0807  NA 144 146* 144 146* 144  K 3.2* 3.8 4.0 4.2 5.1  CL 119* 120* 119* 118* 112*  CO2 18* 21* 19* 17* 22  GLUCOSE 119* 109* 89 100* 127*  BUN 86* 83* 76* 77* 68*  CREATININE 4.93* 4.85* 4.78* 4.91* 4.87*  CALCIUM 9.1 8.5* 8.6* 8.6* 8.1*     CBC:  Recent Labs Lab 08/28/15 1632 09/01/15 2131 09/03/15 0807  WBC 21.5* 18.5* 15.6*  HGB 13.1 11.2* 10.0*  HCT 38.9* 34.2* 31.7*  MCV 87.7 87.8 90.9  PLT 167 173 199      Microbiology:  Recent Results (from the past 720 hour(s))  Blood Culture (routine x 2)     Status: None   Collection Time: 08/21/15 12:47 PM  Result Value Ref  Range Status   Specimen Description BLOOD UNKNOWN  Final   Special Requests BOTTLES DRAWN AEROBIC AND ANAEROBIC  1CC  Final   Culture NO GROWTH 10 DAYS  Final   Report Status 08/31/2015 FINAL  Final  Blood Culture (routine x 2)     Status: None   Collection Time: 08/21/15 12:47 PM  Result Value Ref Range Status   Specimen Description BLOOD LEFT AC  Final   Special Requests BOTTLES DRAWN AEROBIC AND ANAEROBIC  6CC  Final   Culture NO GROWTH 10 DAYS  Final   Report Status 08/31/2015 FINAL  Final  Urine culture     Status: None   Collection Time: 08/21/15 12:47 PM  Result Value Ref Range Status   Specimen Description URINE, CATHETERIZED  Final   Special Requests Normal  Final   Culture NO GROWTH 2 DAYS  Final   Report Status 08/23/2015 FINAL  Final  MRSA PCR Screening     Status: None   Collection Time: 08/21/15 11:47 PM  Result Value Ref Range Status   MRSA by PCR NEGATIVE NEGATIVE Final    Comment:  The GeneXpert MRSA Assay (FDA approved for NASAL specimens only), is one component of a comprehensive MRSA colonization surveillance program. It is not intended to diagnose MRSA infection nor to guide or monitor treatment for MRSA infections.   C difficile quick scan w PCR reflex     Status: Abnormal   Collection Time: 08/22/15  2:31 PM  Result Value Ref Range Status   C Diff antigen POSITIVE (A) NEGATIVE Final   C Diff toxin POSITIVE (A) NEGATIVE Final   C Diff interpretation   Final    Positive for toxigenic C. difficile, active toxin production present.    Comment: CRITICAL RESULT CALLED TO, READ BACK BY AND VERIFIED WITH: CHARLIE FLEETWOOD,RN 08/22/2015 1544 BY J RAZZAKSUAREZ,MT   Culture, bal-quantitative     Status: None   Collection Time: 08/25/15  3:15 PM  Result Value Ref Range Status   Specimen Description BRONCHIAL ALVEOLAR LAVAGE  Final   Special Requests NONE  Final   Gram Stain   Final    GOOD SPECIMEN - 80-90% WBCS FEW WBC SEEN NO ORGANISMS SEEN     Culture HOLDING FOR POSSIBLE PATHOGEN  Final   Report Status 08/27/2015 FINAL  Final  Culture, blood (routine x 2)     Status: None (Preliminary result)   Collection Time: 09/02/15 12:00 PM  Result Value Ref Range Status   Specimen Description BLOOD RIGHT GROIN  Final   Special Requests   Final    BOTTLES DRAWN AEROBIC AND ANAEROBIC  6CC AERO 10CC ANAERO   Culture NO GROWTH 2 DAYS  Final   Report Status PENDING  Incomplete  Culture, blood (routine x 2)     Status: None (Preliminary result)   Collection Time: 09/02/15 12:15 PM  Result Value Ref Range Status   Specimen Description BLOOD RIGHT GROIN  Final   Special Requests BOTTLES DRAWN AEROBIC AND ANAEROBIC  10 CC  Final   Culture NO GROWTH 2 DAYS  Final   Report Status PENDING  Incomplete  Culture, blood (routine x 2)     Status: None (Preliminary result)   Collection Time: 09/02/15  1:47 PM  Result Value Ref Range Status   Specimen Description BLOOD RIGHT HAND  Final   Special Requests BOTTLES DRAWN AEROBIC AND ANAEROBIC 4CC  Final   Culture NO GROWTH 2 DAYS  Final   Report Status PENDING  Incomplete  Culture, blood (routine x 2)     Status: None (Preliminary result)   Collection Time: 09/02/15  2:06 PM  Result Value Ref Range Status   Specimen Description BLOOD LEFT HAND  Final   Special Requests BOTTLES DRAWN AEROBIC AND ANAEROBIC 1CC  Final   Culture NO GROWTH 2 DAYS  Final   Report Status PENDING  Incomplete  MRSA PCR Screening     Status: None   Collection Time: 09/02/15 10:27 PM  Result Value Ref Range Status   MRSA by PCR NEGATIVE NEGATIVE Final    Comment:        The GeneXpert MRSA Assay (FDA approved for NASAL specimens only), is one component of a comprehensive MRSA colonization surveillance program. It is not intended to diagnose MRSA infection nor to guide or monitor treatment for MRSA infections.   Culture, expectorated sputum-assessment     Status: None   Collection Time: 09/03/15 12:18 AM  Result Value  Ref Range Status   Specimen Description SPUTUM  Final   Special Requests Normal  Final   Sputum evaluation THIS SPECIMEN IS ACCEPTABLE FOR  SPUTUM CULTURE  Final   Report Status 09/03/2015 FINAL  Final  Culture, respiratory (NON-Expectorated)     Status: None (Preliminary result)   Collection Time: 09/03/15 12:18 AM  Result Value Ref Range Status   Specimen Description SPUTUM  Final   Special Requests Normal Reflexed from [id number]  Final   Gram Stain   Final    EXCELLENT SPECIMEN - 90-100% WBCS MANY WBC SEEN MANY GRAM NEGATIVE COCCOBACILLI    Culture PENDING  Incomplete   Report Status PENDING  Incomplete    Coagulation Studies: No results for input(s): LABPROT, INR in the last 72 hours.  Urinalysis: No results for input(s): COLORURINE, LABSPEC, PHURINE, GLUCOSEU, HGBUR, BILIRUBINUR, KETONESUR, PROTEINUR, UROBILINOGEN, NITRITE, LEUKOCYTESUR in the last 72 hours.  Invalid input(s): APPERANCEUR    Imaging: Dg Chest 1 View  09/02/2015  CLINICAL DATA:  PICC line adjustment. EXAM: CHEST 1 VIEW COMPARISON:  Same day. FINDINGS: Stable cardiomediastinal silhouette. Endotracheal nasogastric tubes are unchanged in position. Right internal jugular catheter is noted with distal tip in expected position of SVC. Left-sided PICC line has been adjusted with distal tip in expected position of SVC. No pneumothorax is noted. Moderate to large left pleural effusion is noted with probable adjacent atelectasis. Right lung is unremarkable. IMPRESSION: Right-sided PICC line has been adjusted with tip in expected position of SVC. Stable moderate to large left pleural effusion is noted with probable associated atelectasis. Right internal jugular catheter, endotracheal and nasogastric tubes are unchanged in position. Electronically Signed   By: Lupita Raider, M.D.   On: 09/02/2015 18:53   Dg Chest 1 View  09/02/2015  CLINICAL DATA:  PICC line placement. EXAM: CHEST 1 VIEW COMPARISON:  09/02/2015 radiographs.  FINDINGS: 1832 hours. New left arm PICC projects to the upper right atrial level, just inferior to the right IJ position. Endotracheal tube is unchanged with the tip in the mid trachea. A nasogastric tube projects below the diaphragm, tip not visualized. The heart size and mediastinal contours are stable. Left pleural effusion appears unchanged, likely partially loculated. There is associated partial left lung opacification. The right lung is clear. No evidence of pneumothorax. IMPRESSION: 1. Left arm PICC tip extends to the upper right atrial level. 2. The additional support system is stable. 3. Unchanged pleural parenchymal opacities in the left hemithorax. Electronically Signed   By: Carey Bullocks M.D.   On: 09/02/2015 18:44   Dg Abd 1 View  09/02/2015  CLINICAL DATA:  OG tube placement. EXAM: ABDOMEN - 1 VIEW COMPARISON:  08/26/2015 FINDINGS: OG tube tip is in the midbody of the stomach. Bowel gas pattern is normal. No acute osseous abnormality. Double-lumen central venous catheter tip is at the L3 level in the inferior vena cava. IMPRESSION: OG tube in the body of the stomach. No change in the central venous catheter. Normal bowel gas pattern. Electronically Signed   By: Francene Boyers M.D.   On: 09/02/2015 13:49   Dg Chest Port 1 View  09/02/2015  CLINICAL DATA:  Endotracheal and feeding tube placement. EXAM: PORTABLE CHEST 1 VIEW COMPARISON:  Earlier today at zero 2600 hours. FINDINGS: 1321 hours. Endotracheal tube terminates 3.2 cm above carina. Nasogastric tube extends beyond the inferior aspect of the film. Right internal jugular line tip at low SVC. Borderline cardiomegaly. Clear right lung. Similar appearance of left hemi thorax opacification secondary to pleural fluid and lower lobe predominant airspace disease. Residual aerated left upper lung remains. IMPRESSION: Appropriate position of endotracheal tube. Nasogastric tube extends beyond  the inferior aspect of the film. Similar left-sided  pleural fluid and airspace disease. Electronically Signed   By: Jeronimo GreavesKyle  Talbot M.D.   On: 09/02/2015 13:49     Medications:   . fentaNYL infusion INTRAVENOUS 200 mcg/hr (09/04/15 0729)  . norepinephrine 3 mcg/min (09/04/15 0417)  . propofol (DIPRIVAN) infusion 45 mcg/kg/min (09/04/15 0717)   . anidulafungin  100 mg Intravenous Q24H  . antiseptic oral rinse  7 mL Mouth Rinse QID  . chlorhexidine gluconate  15 mL Mouth Rinse BID  . feeding supplement (PRO-STAT SUGAR FREE 64)  30 mL Oral QID  . feeding supplement (VITAL HIGH PROTEIN)  1,000 mL Per Tube Q24H  . free water  30 mL Per Tube Q4H  . heparin subcutaneous  5,000 Units Subcutaneous 3 times per day  . insulin aspart  0-5 Units Subcutaneous QHS  . insulin aspart  0-9 Units Subcutaneous TID WC  . ipratropium-albuterol  3 mL Nebulization Q4H  . pantoprazole (PROTONIX) IV  40 mg Intravenous Q2000  . piperacillin-tazobactam (ZOSYN)  IV  3.375 g Intravenous Q12H  . vancomycin  125 mg Oral 4 times per day  . vancomycin  1,000 mg Intravenous Q M,W,F-HD   acetaminophen **OR** acetaminophen, hydrALAZINE, Influenza vac split quadrivalent PF, ondansetron (ZOFRAN) IV, oxyCODONE, pneumococcal 23 valent vaccine, traZODone  Assessment/ Plan:  49 y.o. male With alcohol abuse, schizophrenia, seizure disorder, coronary disease/MI 2012 [facility] after being found unresponsive in his apartment. Urine toxicology screen is positive for benzodiazepines and cannabinoids. Stool positive for C. Difficile  1. Acute renal failure, likely secondary to ATN from concurrent illness, volume depletion, C. difficile, rhabdomyolysis and sepsis. Has had 3 HD treatments. -Good UOP noted, almost 2 liters over the past 24 hours, therefore no urgent indication for HD at the moment, will continue to follow renal parameters and UOP.    2. Acute rhabdomyolysis M62.82: resolved with conservative management.  3.  Hypernatremia. Last sodium check  showed a Na of 144, continue to monitor.  4.  Hypokalemia. Resolved, in fact K up to 5.1  5. Infective Colitis A09: c. Diff positive.  - back on PO vancomycin.  6.  Acute respiratory failure due to bacterial pneumonia:  Pt for thoracentesis today, continue zosyn and vancomycin.    LOS: 14 Latarsha Zani 11/18/20169:00 AM

## 2015-09-04 NOTE — Care Management (Signed)
CM was Informed that Dr Belia HemanKasa has spoken with patient's mother and she is now declining any kind of transfer to [facility].CM contacted patient's mother and she states that she herself does not have a problem with patient going to another facility but patient's father and sister who live in [place] feel patient"should stay put where he can get all the care he needs.'  Discussed briefly with Ms Cletus GashWitherspoon  patient may receive the [facility] because of their speciality service and skilled at dealing with long term complicated cases.  Discussed that would report to the established unit CM to contact patient's father to discuss his concerns.

## 2015-09-04 NOTE — Consult Note (Signed)
ANTIBIOTIC CONSULT NOTE - INITIAL  Pharmacy Consult for Zosyn Indication: Sepsis  Allergies  Allergen Reactions  . Antihistamines, Diphenhydramine-Type Anaphylaxis and Rash  . Benadryl [Diphenhydramine Hcl] Anaphylaxis and Rash  . Hydroxyzine Anaphylaxis and Rash    Patient Measurements: Height:  (180.3 cm) Weight: 252 lb 6.8 oz (114.5 kg) IBW/kg (Calculated) : 75.3 Adjusted Body Weight:   Vital Signs: Temp: 97 F (36.1 C) (11/18 0800) Temp Source: Oral (11/18 0800) BP: 110/75 mmHg (11/18 1102) Pulse Rate: 94 (11/18 1102) Intake/Output from previous day: 11/17 0701 - 11/18 0700 In: 3162.2 [I.V.:2412.2; NG/GT:520; IV Piggyback:230] Out: 2040 [Urine:2040] Intake/Output from this shift: Total I/O In: 307.2 [I.V.:247.2; Other:60] Out: 500 [Urine:500]  Labs:  Recent Labs  09/01/15 2131 09/02/15 0507 09/03/15 0807 09/04/15 0925  WBC 18.5*  --  15.6*  --   HGB 11.2*  --  10.0*  --   PLT 173  --  199  --   CREATININE  --  4.91* 4.87* 4.92*   Estimated Creatinine Clearance: 23.9 mL/min (by C-G formula based on Cr of 4.92). No results for input(s): VANCOTROUGH, VANCOPEAK, VANCORANDOM, GENTTROUGH, GENTPEAK, GENTRANDOM, TOBRATROUGH, TOBRAPEAK, TOBRARND, AMIKACINPEAK, AMIKACINTROU, AMIKACIN in the last 72 hours.   Microbiology: Recent Results (from the past 720 hour(s))  Blood Culture (routine x 2)     Status: None   Collection Time: 08/21/15 12:47 PM  Result Value Ref Range Status   Specimen Description BLOOD UNKNOWN  Final   Special Requests BOTTLES DRAWN AEROBIC AND ANAEROBIC  1CC  Final   Culture NO GROWTH 10 DAYS  Final   Report Status 08/31/2015 FINAL  Final  Blood Culture (routine x 2)     Status: None   Collection Time: 08/21/15 12:47 PM  Result Value Ref Range Status   Specimen Description BLOOD LEFT AC  Final   Special Requests BOTTLES DRAWN AEROBIC AND ANAEROBIC  6CC  Final   Culture NO GROWTH 10 DAYS  Final   Report Status 08/31/2015 FINAL  Final   Urine culture     Status: None   Collection Time: 08/21/15 12:47 PM  Result Value Ref Range Status   Specimen Description URINE, CATHETERIZED  Final   Special Requests Normal  Final   Culture NO GROWTH 2 DAYS  Final   Report Status 08/23/2015 FINAL  Final  MRSA PCR Screening     Status: None   Collection Time: 08/21/15 11:47 PM  Result Value Ref Range Status   MRSA by PCR NEGATIVE NEGATIVE Final    Comment:        The GeneXpert MRSA Assay (FDA approved for NASAL specimens only), is one component of a comprehensive MRSA colonization surveillance program. It is not intended to diagnose MRSA infection nor to guide or monitor treatment for MRSA infections.   C difficile quick scan w PCR reflex     Status: Abnormal   Collection Time: 08/22/15  2:31 PM  Result Value Ref Range Status   C Diff antigen POSITIVE (A) NEGATIVE Final   C Diff toxin POSITIVE (A) NEGATIVE Final   C Diff interpretation   Final    Positive for toxigenic C. difficile, active toxin production present.    Comment: CRITICAL RESULT CALLED TO, READ BACK BY AND VERIFIED WITH: CHARLIE FLEETWOOD,RN 08/22/2015 1544 BY J RAZZAKSUAREZ,MT   Culture, bal-quantitative     Status: None   Collection Time: 08/25/15  3:15 PM  Result Value Ref Range Status   Specimen Description BRONCHIAL ALVEOLAR LAVAGE  Final  Special Requests NONE  Final   Gram Stain   Final    GOOD SPECIMEN - 80-90% WBCS FEW WBC SEEN NO ORGANISMS SEEN    Culture HOLDING FOR POSSIBLE PATHOGEN  Final   Report Status 08/27/2015 FINAL  Final  Culture, blood (routine x 2)     Status: None (Preliminary result)   Collection Time: 09/02/15 12:00 PM  Result Value Ref Range Status   Specimen Description BLOOD RIGHT GROIN  Final   Special Requests   Final    BOTTLES DRAWN AEROBIC AND ANAEROBIC  6CC AERO 10CC ANAERO   Culture NO GROWTH 2 DAYS  Final   Report Status PENDING  Incomplete  Culture, blood (routine x 2)     Status: None (Preliminary result)    Collection Time: 09/02/15 12:15 PM  Result Value Ref Range Status   Specimen Description BLOOD RIGHT GROIN  Final   Special Requests BOTTLES DRAWN AEROBIC AND ANAEROBIC  10 CC  Final   Culture NO GROWTH 2 DAYS  Final   Report Status PENDING  Incomplete  Culture, blood (routine x 2)     Status: None (Preliminary result)   Collection Time: 09/02/15  1:47 PM  Result Value Ref Range Status   Specimen Description BLOOD RIGHT HAND  Final   Special Requests BOTTLES DRAWN AEROBIC AND ANAEROBIC 4CC  Final   Culture NO GROWTH 2 DAYS  Final   Report Status PENDING  Incomplete  Culture, blood (routine x 2)     Status: None (Preliminary result)   Collection Time: 09/02/15  2:06 PM  Result Value Ref Range Status   Specimen Description BLOOD LEFT HAND  Final   Special Requests BOTTLES DRAWN AEROBIC AND ANAEROBIC 1CC  Final   Culture NO GROWTH 2 DAYS  Final   Report Status PENDING  Incomplete  MRSA PCR Screening     Status: None   Collection Time: 09/02/15 10:27 PM  Result Value Ref Range Status   MRSA by PCR NEGATIVE NEGATIVE Final    Comment:        The GeneXpert MRSA Assay (FDA approved for NASAL specimens only), is one component of a comprehensive MRSA colonization surveillance program. It is not intended to diagnose MRSA infection nor to guide or monitor treatment for MRSA infections.   Culture, expectorated sputum-assessment     Status: None   Collection Time: 09/03/15 12:18 AM  Result Value Ref Range Status   Specimen Description SPUTUM  Final   Special Requests Normal  Final   Sputum evaluation THIS SPECIMEN IS ACCEPTABLE FOR SPUTUM CULTURE  Final   Report Status 09/03/2015 FINAL  Final  Culture, respiratory (NON-Expectorated)     Status: None (Preliminary result)   Collection Time: 09/03/15 12:18 AM  Result Value Ref Range Status   Specimen Description SPUTUM  Final   Special Requests Normal Reflexed from [id number]  Final   Gram Stain   Final    EXCELLENT SPECIMEN - 90-100%  WBCS MANY WBC SEEN MANY GRAM NEGATIVE COCCOBACILLI    Culture   Final    MODERATE GROWTH GRAM NEGATIVE RODS IDENTIFICATION AND SUSCEPTIBILITIES TO FOLLOW    Report Status PENDING  Incomplete    Medical History: Past Medical History  Diagnosis Date  . Sleep apnea     had surgery to correct it  . Mental disorder     PTSD  . Seizures (HCC)     currently dx with seizures  . Cardiac arrest (HCC) 08/23/2011  . Pneumonia 09/04/2011  .  Pancreatitis   . SOB (shortness of breath) 09/11/2011  . Hypertension   . Anxiety   . Myocardial infarction (HCC)   . Schizophrenia (HCC)   . Rhabdomyolysis 08/23/2015  . Acute renal failure (HCC) 08/23/2015    Medications:  Scheduled:  . anidulafungin  100 mg Intravenous Q24H  . antiseptic oral rinse  7 mL Mouth Rinse QID  . chlorhexidine gluconate  15 mL Mouth Rinse BID  . feeding supplement (PRO-STAT SUGAR FREE 64)  30 mL Oral QID  . feeding supplement (VITAL HIGH PROTEIN)  1,000 mL Per Tube Q24H  . free water  30 mL Per Tube Q4H  . heparin subcutaneous  5,000 Units Subcutaneous 3 times per day  . insulin aspart  0-5 Units Subcutaneous QHS  . insulin aspart  0-9 Units Subcutaneous TID WC  . ipratropium-albuterol  3 mL Nebulization Q4H  . pantoprazole (PROTONIX) IV  40 mg Intravenous Q2000  . piperacillin-tazobactam (ZOSYN)  IV  3.375 g Intravenous Q12H  . vancomycin  125 mg Oral 4 times per day   Assessment: Pt is a 49 year old male with hypoxia, worsening renal failure requiring HD, sepsis, rhabdo, c.diff, seizures, treated with vanc (11/4-11/10) and zosyn 11/4-11/6 and po vanc 11/5 to current.   Plan:  Vancomycin d/c. Will continue Zosyn 3.375 g EI q 12 hours. Will continue to follow culture results.    Luisa Hart D 09/04/2015,12:01 PM

## 2015-09-04 NOTE — Progress Notes (Signed)
Spoke with Radiologist regarding no consent for thoracentesis. The MD will obtain consent

## 2015-09-04 NOTE — Progress Notes (Signed)
[facility] CLINIC INFECTIOUS DISEASE PROGRESS NOTE Date of Admission:  08/21/2015     ID: Ross [name] is a 49 y.o. male with  C diff, sepsis,   Active Problems:   Sepsis (HCC)   Acute respiratory failure (HCC)   Enteritis due to Clostridium difficile   Rhabdomyolysis   Acute renal failure (HCC)   Acute respiratory failure with hypoxia (HCC)   Altered mental status   Subjective: Remains intubated. On pressors but decreasing and HR BP stable. R neck line removed. groin HD cath in place Had thoracentesis today  ROS  Eleven systems are reviewed and negative except per hpi  Medications:  Antibiotics Given (last 72 hours)    Date/Time Action Medication Dose Rate   09/01/15 1819 Given   vancomycin (VANCOCIN) 50 mg/mL oral solution 125 mg 125 mg    09/01/15 2324 Given   vancomycin (VANCOCIN) 50 mg/mL oral solution 125 mg 125 mg    09/02/15 0514 Given   vancomycin (VANCOCIN) 50 mg/mL oral solution 125 mg 125 mg    09/02/15 1452 Given   piperacillin-tazobactam (ZOSYN) IVPB 3.375 g 3.375 g 12.5 mL/hr   09/02/15 1620 Given   vancomycin (VANCOCIN) 2,000 mg in sodium chloride 0.9 % 500 mL IVPB 2,000 mg 250 mL/hr   09/02/15 2311 Given   vancomycin (VANCOCIN) 50 mg/mL oral solution 125 mg 125 mg    09/03/15 0048 Given   piperacillin-tazobactam (ZOSYN) IVPB 3.375 g 3.375 g 12.5 mL/hr   09/03/15 0527 Given   vancomycin (VANCOCIN) 50 mg/mL oral solution 125 mg 125 mg    09/03/15 1200 Given   vancomycin (VANCOCIN) 50 mg/mL oral solution 125 mg 125 mg    09/03/15 1356 Given   piperacillin-tazobactam (ZOSYN) IVPB 3.375 g 3.375 g 12.5 mL/hr   09/04/15 0036 Given   vancomycin (VANCOCIN) 50 mg/mL oral solution 125 mg 125 mg    09/04/15 0049 Given   piperacillin-tazobactam (ZOSYN) IVPB 3.375 g 3.375 g 12.5 mL/hr   09/04/15 0535 Given   vancomycin (VANCOCIN) 50 mg/mL oral solution 125 mg 125 mg    09/04/15 1131 Given   vancomycin (VANCOCIN) 50 mg/mL oral solution 125 mg 125 mg     09/04/15 1325 Given   piperacillin-tazobactam (ZOSYN) IVPB 3.375 g 3.375 g 12.5 mL/hr     . anidulafungin  100 mg Intravenous Q24H  . antiseptic oral rinse  7 mL Mouth Rinse QID  . chlorhexidine gluconate  15 mL Mouth Rinse BID  . feeding supplement (PRO-STAT SUGAR FREE 64)  30 mL Oral 6 times per day  . feeding supplement (VITAL HIGH PROTEIN)  1,000 mL Per Tube Q24H  . free water  30 mL Per Tube Q4H  . heparin subcutaneous  5,000 Units Subcutaneous 3 times per day  . insulin aspart  0-5 Units Subcutaneous QHS  . insulin aspart  0-9 Units Subcutaneous TID WC  . ipratropium-albuterol  3 mL Nebulization Q4H  . pantoprazole (PROTONIX) IV  40 mg Intravenous Q2000  . piperacillin-tazobactam (ZOSYN)  IV  3.375 g Intravenous Q12H  . vancomycin  125 mg Oral 4 times per day    Objective: Vital signs in last 24 hours: Temp:  [97 F (36.1 C)-98.9 F (37.2 C)] 97 F (36.1 C) (11/18 0800) Pulse Rate:  [72-98] 83 (11/18 1300) Resp:  [19-34] 20 (11/18 1300) BP: (99-122)/(56-75) 109/65 mmHg (11/18 1300) SpO2:  [98 %-100 %] 99 % (11/18 1300) FiO2 (%):  [40 %] 40 % (11/18 1200) Weight:  [114.5 kg (252  lb 6.8 oz)] 114.5 kg (252 lb 6.8 oz) (11/18 0400) Constitutional: intubated, sedated, hypotensive HENT: anicteric Mouth/Throat: ett in place Cardiovascular: tachy Pulmonary/Chest: mech breath sounds Abdominal: Soft. Bowel sounds are normal. He exhibits no distension. There is no tenderness.  Lymphadenopathy:  He has no cervical adenopathy.  Neurological:sedated  Skin: Skin is warm and dry. No rash noted. No erythema.  Access R groin HD cath site wnl.  Lab Results  Recent Labs  09/01/15 2131  09/03/15 0807 09/04/15 0925  WBC 18.5*  --  15.6*  --   HGB 11.2*  --  10.0*  --   HCT 34.2*  --  31.7*  --   NA  --   < > 144 143  K  --   < > 5.1 4.8  CL  --   < > 112* 112*  CO2  --   < > 22 22  BUN  --   < > 68* 66*  CREATININE  --   < > 4.87* 4.92*  < > = values in this interval not  displayed.  Microbiology: Results for orders placed or performed during the hospital encounter of 08/21/15  Blood Culture (routine x 2)     Status: None   Collection Time: 08/21/15 12:47 PM  Result Value Ref Range Status   Specimen Description BLOOD UNKNOWN  Final   Special Requests BOTTLES DRAWN AEROBIC AND ANAEROBIC  1CC  Final   Culture NO GROWTH 10 DAYS  Final   Report Status 08/31/2015 FINAL  Final  Blood Culture (routine x 2)     Status: None   Collection Time: 08/21/15 12:47 PM  Result Value Ref Range Status   Specimen Description BLOOD LEFT AC  Final   Special Requests BOTTLES DRAWN AEROBIC AND ANAEROBIC  6CC  Final   Culture NO GROWTH 10 DAYS  Final   Report Status 08/31/2015 FINAL  Final  Urine culture     Status: None   Collection Time: 08/21/15 12:47 PM  Result Value Ref Range Status   Specimen Description URINE, CATHETERIZED  Final   Special Requests Normal  Final   Culture NO GROWTH 2 DAYS  Final   Report Status 08/23/2015 FINAL  Final  MRSA PCR Screening     Status: None   Collection Time: 08/21/15 11:47 PM  Result Value Ref Range Status   MRSA by PCR NEGATIVE NEGATIVE Final    Comment:        The GeneXpert MRSA Assay (FDA approved for NASAL specimens only), is one component of a comprehensive MRSA colonization surveillance program. It is not intended to diagnose MRSA infection nor to guide or monitor treatment for MRSA infections.   C difficile quick scan w PCR reflex     Status: Abnormal   Collection Time: 08/22/15  2:31 PM  Result Value Ref Range Status   C Diff antigen POSITIVE (A) NEGATIVE Final   C Diff toxin POSITIVE (A) NEGATIVE Final   C Diff interpretation   Final    Positive for toxigenic C. difficile, active toxin production present.    Comment: CRITICAL RESULT CALLED TO, READ BACK BY AND VERIFIED WITH: CHARLIE FLEETWOOD,RN 08/22/2015 1544 BY J RAZZAKSUAREZ,MT   Culture, bal-quantitative     Status: None   Collection Time: 08/25/15  3:15 PM   Result Value Ref Range Status   Specimen Description BRONCHIAL ALVEOLAR LAVAGE  Final   Special Requests NONE  Final   Gram Stain   Final    GOOD  SPECIMEN - 80-90% WBCS FEW WBC SEEN NO ORGANISMS SEEN    Culture HOLDING FOR POSSIBLE PATHOGEN  Final   Report Status 08/27/2015 FINAL  Final  Culture, blood (routine x 2)     Status: None (Preliminary result)   Collection Time: 09/02/15 12:00 PM  Result Value Ref Range Status   Specimen Description BLOOD RIGHT GROIN  Final   Special Requests   Final    BOTTLES DRAWN AEROBIC AND ANAEROBIC  6CC AERO 10CC ANAERO   Culture NO GROWTH 2 DAYS  Final   Report Status PENDING  Incomplete  Culture, blood (routine x 2)     Status: None (Preliminary result)   Collection Time: 09/02/15 12:15 PM  Result Value Ref Range Status   Specimen Description BLOOD RIGHT GROIN  Final   Special Requests BOTTLES DRAWN AEROBIC AND ANAEROBIC  10 CC  Final   Culture NO GROWTH 2 DAYS  Final   Report Status PENDING  Incomplete  Culture, blood (routine x 2)     Status: None (Preliminary result)   Collection Time: 09/02/15  1:47 PM  Result Value Ref Range Status   Specimen Description BLOOD RIGHT HAND  Final   Special Requests BOTTLES DRAWN AEROBIC AND ANAEROBIC 4CC  Final   Culture NO GROWTH 2 DAYS  Final   Report Status PENDING  Incomplete  Culture, blood (routine x 2)     Status: None (Preliminary result)   Collection Time: 09/02/15  2:06 PM  Result Value Ref Range Status   Specimen Description BLOOD LEFT HAND  Final   Special Requests BOTTLES DRAWN AEROBIC AND ANAEROBIC 1CC  Final   Culture NO GROWTH 2 DAYS  Final   Report Status PENDING  Incomplete  MRSA PCR Screening     Status: None   Collection Time: 09/02/15 10:27 PM  Result Value Ref Range Status   MRSA by PCR NEGATIVE NEGATIVE Final    Comment:        The GeneXpert MRSA Assay (FDA approved for NASAL specimens only), is one component of a comprehensive MRSA colonization surveillance program. It is  not intended to diagnose MRSA infection nor to guide or monitor treatment for MRSA infections.   Culture, expectorated sputum-assessment     Status: None   Collection Time: 09/03/15 12:18 AM  Result Value Ref Range Status   Specimen Description SPUTUM  Final   Special Requests Normal  Final   Sputum evaluation THIS SPECIMEN IS ACCEPTABLE FOR SPUTUM CULTURE  Final   Report Status 09/03/2015 FINAL  Final  Culture, respiratory (NON-Expectorated)     Status: None (Preliminary result)   Collection Time: 09/03/15 12:18 AM  Result Value Ref Range Status   Specimen Description SPUTUM  Final   Special Requests Normal Reflexed from [id number]  Final   Gram Stain   Final    EXCELLENT SPECIMEN - 90-100% WBCS MANY WBC SEEN MANY GRAM NEGATIVE COCCOBACILLI    Culture   Final    MODERATE GROWTH GRAM NEGATIVE RODS IDENTIFICATION AND SUSCEPTIBILITIES TO FOLLOW    Report Status PENDING  Incomplete    Studies/Results: Dg Chest 1 View  09/04/2015  CLINICAL DATA:  Status post left thoracentesis for 1.8 L EXAM: CHEST  1 VIEW COMPARISON:  11/16/6 FINDINGS: Persisting consolidation is noted in the left lower lobe. A moderate pleural effusion is noted but decreased from the prior exam. The residual fluid is likely related to some fibrin cross linking precluding complete drainage. No pneumothorax is noted. The endotracheal tube, nasogastric  catheter and left-sided PICC line are in stable position. The right jugular central line is been removed in the interval. The right lung remains clear. IMPRESSION: Reduction in pleural effusion on the left. No pneumothorax is noted. Electronically Signed   By: Alcide Clever M.D.   On: 09/04/2015 11:22   Dg Chest 1 View  09/02/2015  CLINICAL DATA:  PICC line adjustment. EXAM: CHEST 1 VIEW COMPARISON:  Same day. FINDINGS: Stable cardiomediastinal silhouette. Endotracheal nasogastric tubes are unchanged in position. Right internal jugular catheter is noted with distal tip in  expected position of SVC. Left-sided PICC line has been adjusted with distal tip in expected position of SVC. No pneumothorax is noted. Moderate to large left pleural effusion is noted with probable adjacent atelectasis. Right lung is unremarkable. IMPRESSION: Right-sided PICC line has been adjusted with tip in expected position of SVC. Stable moderate to large left pleural effusion is noted with probable associated atelectasis. Right internal jugular catheter, endotracheal and nasogastric tubes are unchanged in position. Electronically Signed   By: Lupita Raider, M.D.   On: 09/02/2015 18:53   Dg Chest 1 View  09/02/2015  CLINICAL DATA:  PICC line placement. EXAM: CHEST 1 VIEW COMPARISON:  09/02/2015 radiographs. FINDINGS: 1832 hours. New left arm PICC projects to the upper right atrial level, just inferior to the right IJ position. Endotracheal tube is unchanged with the tip in the mid trachea. A nasogastric tube projects below the diaphragm, tip not visualized. The heart size and mediastinal contours are stable. Left pleural effusion appears unchanged, likely partially loculated. There is associated partial left lung opacification. The right lung is clear. No evidence of pneumothorax. IMPRESSION: 1. Left arm PICC tip extends to the upper right atrial level. 2. The additional support system is stable. 3. Unchanged pleural parenchymal opacities in the left hemithorax. Electronically Signed   By: Carey Bullocks M.D.   On: 09/02/2015 18:44   Dg Abd 1 View  09/02/2015  CLINICAL DATA:  OG tube placement. EXAM: ABDOMEN - 1 VIEW COMPARISON:  08/26/2015 FINDINGS: OG tube tip is in the midbody of the stomach. Bowel gas pattern is normal. No acute osseous abnormality. Double-lumen central venous catheter tip is at the L3 level in the inferior vena cava. IMPRESSION: OG tube in the body of the stomach. No change in the central venous catheter. Normal bowel gas pattern. Electronically Signed   By: Francene Boyers M.D.    On: 09/02/2015 13:49   US Thoracentesis Asp Pleural Space W/img Guide  09/04/2015  CLINICAL DATA:  Left pleural effusion EXAM: ULTRASOUND GUIDED left THORACENTESIS PROCEDURE: An ultrasound guided thoracentesis was thoroughly discussed with the patient and questions answered. The benefits, risks, alternatives and complications were also discussed. The patient understands and wishes to proceed with the procedure. Written consent was obtained. Ultrasound was performed to localize and mark an adequate pocket of fluid in the left chest. The area was then prepped and draped in the normal sterile fashion. 1% Lidocaine was used for local anesthesia. Under ultrasound guidance a 6 French thoracentesis catheter was introduced. Thoracentesis was performed. The catheter was removed and a dressing applied. COMPLICATIONS: None FINDINGS: A total of approximately 1800 mL of bloody fluid was removed. A fluid sample was sent for laboratory analysis. IMPRESSION: Successful ultrasound guided left thoracentesis yielding 1.8 L of pleural fluid. Electronically Signed   By: Alcide Clever M.D.   On: 09/04/2015 11:21    Assessment/Plan: Ross [name] is a 49 y.o. male with prolonged admission  after being found unresponsive, with ARF, rhabdo, sepsis, prolonged intubated, now with repeat sepsis in setting of HD with possible recurrent PNA and two central lines in place. Also has C diff. HIV is negative. BCX ngtd.  Sputum cx with GNR pending.  Recommendations Cont vanco and zosyn pending cxs - if BCX remain neg can tailor gram negative coverage to organism grown over weekend Cont anidulafungin for candidemia coverage for now but if bcx neg at 5 days would stop Consider repeat bronch or CT chestif worsens His  neck line has been changed  - can consider removing HD cath Thank you very much for the consult. Will follow with you.  Dona Klemann   09/04/2015, 1:33 PM

## 2015-09-04 NOTE — Progress Notes (Signed)
[facility] [place] [facility]   PATIENT NAME: Ross ManorJames Konen    MR#:  [id number]  DATE OF BIRTH:  July 29, 1966    CHIEF COMPLAINT:   Chief Complaint  Patient presents with  . Seizures   Intubated. Sedated.  REVIEW OF SYSTEMS:   Review of Systems  Unable to perform ROS: intubated  Constitutional: Negative for fever and chills.  HENT: Negative for hearing loss.   Eyes: Negative for blurred vision, double vision and photophobia.  Respiratory: Negative for cough, hemoptysis, sputum production and wheezing.   Cardiovascular: Negative for chest pain, palpitations, orthopnea and leg swelling.  Gastrointestinal: Negative for nausea, vomiting, abdominal pain and diarrhea.  Genitourinary: Negative for dysuria and urgency.  Musculoskeletal: Negative for neck pain.  Skin: Negative for rash.  Neurological: Negative for dizziness, focal weakness, seizures, weakness and headaches.  Psychiatric/Behavioral: Negative for memory loss. The patient does not have insomnia.     Have anxiety, PTSD issues. Received some Haldol for hallucinations. DRUG ALLERGIES:   Allergies  Allergen Reactions  . Antihistamines, Diphenhydramine-Type Anaphylaxis and Rash  . Benadryl [Diphenhydramine Hcl] Anaphylaxis and Rash  . Hydroxyzine Anaphylaxis and Rash    VITALS:  Blood pressure 108/64, pulse 80, temperature 97 F (36.1 C), temperature source Oral, resp. rate 20, height 5\' 11"  (1.803 m), weight 114.5 kg (252 lb 6.8 oz), SpO2 100 %.  PHYSICAL EXAMINATION:  GENERAL:  49 y.o.-year-old patient   seen in the room, currently intubated, sedated.  PERLA; ENT;orally intubated.  Pulmonary ; his breath sounds are coarse, good air movement   CARDIOVASCULAR: S1, S2 normal. No murmurs, rubs, or gallops. ABDOMEN: Soft, nontender, nondistended. Bowel sounds present. No organomegaly or mass.  EXTREMITIES: left leg edema. cyanosis, or clubbing. Peripheral pulses 2+ NEUROLOGIC:  intubated/sedated.  SKIN: No obvious rash, lesion, or ulcer.  Bilateral leg edema  Psych;.'intubated/sedated.  LABORATORY PANEL:   CBC  Recent Labs Lab 09/03/15 0807  WBC 15.6*  HGB 10.0*  HCT 31.7*  PLT 199   ------------------------------------------------------------------------------------------------------------------  Chemistries   Recent Labs Lab 09/04/15 0925  NA 143  K 4.8  CL 112*  CO2 22  GLUCOSE 96  BUN 66*  CREATININE 4.92*  CALCIUM 8.1*   ------------------------------------------------------------------------------------------------------------------  Cardiac Enzymes No results for input(s): TROPONINI in the last 168 hours. ------------------------------------------------------------------------------------------------------------------  RADIOLOGY:  Dg Chest 1 View  09/04/2015  CLINICAL DATA:  Status post left thoracentesis for 1.8 L EXAM: CHEST  1 VIEW COMPARISON:  11/16/6 FINDINGS: Persisting consolidation is noted in the left lower lobe. A moderate pleural effusion is noted but decreased from the prior exam. The residual fluid is likely related to some fibrin cross linking precluding complete drainage. No pneumothorax is noted. The endotracheal tube, nasogastric catheter and left-sided PICC line are in stable position. The right jugular central line is been removed in the interval. The right lung remains clear. IMPRESSION: Reduction in pleural effusion on the left. No pneumothorax is noted. Electronically Signed   By: Alcide CleverMark  Lukens M.D.   On: 09/04/2015 11:22   Dg Chest 1 View  09/02/2015  CLINICAL DATA:  PICC line adjustment. EXAM: CHEST 1 VIEW COMPARISON:  Same day. FINDINGS: Stable cardiomediastinal silhouette. Endotracheal nasogastric tubes are unchanged in position. Right internal jugular catheter is noted with distal tip in expected position of SVC. Left-sided PICC line has been adjusted with distal tip in expected position of SVC. No  pneumothorax is noted. Moderate to large left pleural effusion is noted with probable adjacent atelectasis. Right  lung is unremarkable. IMPRESSION: Right-sided PICC line has been adjusted with tip in expected position of SVC. Stable moderate to large left pleural effusion is noted with probable associated atelectasis. Right internal jugular catheter, endotracheal and nasogastric tubes are unchanged in position. Electronically Signed   By: Lupita Raider, M.D.   On: 09/02/2015 18:53   Dg Chest 1 View  09/02/2015  CLINICAL DATA:  PICC line placement. EXAM: CHEST 1 VIEW COMPARISON:  09/02/2015 radiographs. FINDINGS: 1832 hours. New left arm PICC projects to the upper right atrial level, just inferior to the right IJ position. Endotracheal tube is unchanged with the tip in the mid trachea. A nasogastric tube projects below the diaphragm, tip not visualized. The heart size and mediastinal contours are stable. Left pleural effusion appears unchanged, likely partially loculated. There is associated partial left lung opacification. The right lung is clear. No evidence of pneumothorax. IMPRESSION: 1. Left arm PICC tip extends to the upper right atrial level. 2. The additional support system is stable. 3. Unchanged pleural parenchymal opacities in the left hemithorax. Electronically Signed   By: Carey Bullocks M.D.   On: 09/02/2015 18:44   US Thoracentesis Asp Pleural Space W/img Guide  09/04/2015  CLINICAL DATA:  Left pleural effusion EXAM: ULTRASOUND GUIDED left THORACENTESIS PROCEDURE: An ultrasound guided thoracentesis was thoroughly discussed with the patient and questions answered. The benefits, risks, alternatives and complications were also discussed. The patient understands and wishes to proceed with the procedure. Written consent was obtained. Ultrasound was performed to localize and mark an adequate pocket of fluid in the left chest. The area was then prepped and draped in the normal sterile fashion. 1%  Lidocaine was used for local anesthesia. Under ultrasound guidance a 6 French thoracentesis catheter was introduced. Thoracentesis was performed. The catheter was removed and a dressing applied. COMPLICATIONS: None FINDINGS: A total of approximately 1800 mL of bloody fluid was removed. A fluid sample was sent for laboratory analysis. IMPRESSION: Successful ultrasound guided left thoracentesis yielding 1.8 L of pleural fluid. Electronically Signed   By: Alcide Clever M.D.   On: 09/04/2015 11:21    EKG:   Orders placed or performed during the hospital encounter of 08/21/15  . EKG 12-Lead  . EKG 12-Lead    ASSESSMENT AND PLAN:    #1 acute respiratory failure with hypoxia and altered mental status: ` Recurrent acute respiratory failure right now has pneumonia: Patient started on vancomycin, Zosyn. Seen by Dr. Sampson Goon. Continue full vent support. Follow daily x-rays, sputum cultures. Could consider repeat chest CT or bronchoscopy if unable to extubate. Appreciate pulmonology following  2 Septic shock secondary to pneumonia and also possible line sepsis: Patient is on IV Vanco, Zosyn, caspofungin for  Possible fungemia. Continue to monitor cultures. Consider removing hemodialysis catheter if no further hemodialysis as planned  #3 Rhabdomyolysis: CK trending down,continue to monitor. Stopped  IV hydration. - Possibly due to prolonged immobilization, hypotension, diarrhea, possible withdrawal seizures prior to admission.   #4 acute renal failure: secondary to ATN from one depletion, rhabdomyolysis, C. Difficile. Good urine output. No need for hemodialysis at this time.  #5 transaminitis: improving,LFTS trending down. Recheck in a.m.  6 .left leg edema;dvt study is negative.  #7 ,anxiety, mental disorder with PTSD: .schizophrenia;consult psych when extubated.  Transfer to [facility] when family agrees. All the records are reviewed and case discussed with Care Management/Social Workerr. Management  plans discussed with the patient, family and they are in agreement.  CODE STATUS: Full  TOTAL CRITICAL CARE TIME TAKING CARE OF THIS PATIENT: 35 minutes.    POSSIBLE D/C  3-4DAYS, DEPENDING ON CLINICAL CONDITION.   Elby Showers M.D on 09/04/2015 at 5:08 PM  Between 7am to 6pm - Pager - [phone]  After 6pm go to [email or web address] - password [email or web address] [facility]  [place] [facility]  Office  [phone]  CC: Primary care physician; No primary care provider on file.

## 2015-09-04 NOTE — Progress Notes (Signed)
Nutrition Follow-up    INTERVENTION:   EN: recommend continuing Vital High Protein at rate of 20 ml/hr with increase in Prostat 6 times daily; additional estimated 733 kcals from diprivan). TF provides 1080 kcals, 132 g of protein. Meets 85% protein needs, 100% calorie needs. Continue to asses   NUTRITION DIAGNOSIS:   Inadequate oral intake related to acute illness as evidenced by NPO status.  GOAL:   Provide needs based on ASPEN/SCCM guidelines  MONITOR:    (Energy intake, Digestive system, Electrolyte and renal profile)  REASON FOR ASSESSMENT:   Consult Enteral/tube feeding initiation and management  ASSESSMENT:   Pt remains on vent, plan for US guided thoracentesis today; pt has received 3 HD treatments, no urgent indication for HD at present  Diet Order:  Diet NPO time specified   EN: tolerating Vital High Protein at rate of 20 ml/hr,   Digestive System: no signs of TF intolerance, last BM 11/16, Cdiff positive  Electrolyte and Renal Profile:  Recent Labs Lab 09/02/15 0507 09/03/15 0807 09/04/15 0925  BUN 77* 68* 66*  CREATININE 4.91* 4.87* 4.92*  NA 146* 144 143  K 4.2 5.1 4.8   Glucose Profile:  Recent Labs  09/04/15 0421 09/04/15 0722 09/04/15 1128  GLUCAP 104* 76 75   Lipid Profile:     Component Value Date/Time   CHOL 259* 09/05/2011 0535   TRIG 88 09/02/2015 1236   HDL 50 09/05/2011 0535   CHOLHDL 5.2 09/05/2011 0535   VLDL 26 09/05/2011 0535   LDLCALC 183* 09/05/2011 0535   Meds: diprivan (733 kcals in past 24 hours based on documentation)  Height:   Ht Readings from Last 1 Encounters:  08/21/15 5\' 11"  (1.803 m)    Weight:   Wt Readings from Last 1 Encounters:  09/04/15 252 lb 6.8 oz (114.5 kg)    Ideal Body Weight:     BMI:  Body mass index is 35.22 kg/(m^2).  Estimated Nutritional Needs:   Kcal:  [id number] kcals/d (11-14 kcals/kg Actual BW 117kg)  Protein:  (2.0-2.5 g/kg) Using IBW 78 kg 156-195 g/d  Fluid:  (1000ml  + UOP)  EDUCATION NEEDS:   No education needs identified at this time  HIGH Care Level  Romelle Starcherate Shunte Senseney MS, RD, LDN [phone] Pager

## 2015-09-04 NOTE — Consult Note (Signed)
[facility]      Name: Ross SpillersJames L [name] MRN: [id number] DOB: [birth year]    ADMISSION DATE:  08/21/2015   CHIEF COMPLAINT:    acute resp failure   SUBJECTIVE   Remains intubated,sedated fio2 40% On vasopressors, plan for US guided thoracentesis today by IR Mother has provided consent  SIGNIFICANT EVENTS   11/4 intubated 11/5 failed vent wean due to encephalopathy and acidosis 11/6 RIJ CVL, R Fem Vein VasCath 11/8 bronchoscopy with BAL 11/9 self extubated 11/15 transferred back to ICU 11/15 intubated  Review of Systems  Unable to perform ROS: critical illness      VITAL SIGNS    Temp:  [97.7 F (36.5 C)-98.9 F (37.2 C)] 98.3 F (36.8 C) (11/18 0400) Pulse Rate:  [72-98] 72 (11/18 0700) Resp:  [8-22] 20 (11/18 0700) BP: (94-116)/(56-74) 112/67 mmHg (11/18 0700) SpO2:  [98 %-100 %] 100 % (11/18 0700) FiO2 (%):  [40 %] 40 % (11/18 0313) Weight:  [252 lb 6.8 oz (114.5 kg)] 252 lb 6.8 oz (114.5 kg) (11/18 0400) HEMODYNAMICS:   VENTILATOR SETTINGS: Vent Mode:  [-] PRVC FiO2 (%):  [40 %] 40 % Set Rate:  [20 bmp] 20 bmp Vt Set:  [500 mL] 500 mL PEEP:  [5 cmH20] 5 cmH20 INTAKE / OUTPUT:  Intake/Output Summary (Last 24 hours) at 09/04/15 0809 Last data filed at 09/04/15 0600  Gross per 24 hour  Intake 3071.44 ml  Output   1565 ml  Net 1506.44 ml       PHYSICAL EXAM   Physical Exam  Constitutional: He appears distressed.  HENT:  Head: Normocephalic and atraumatic.  Eyes: Pupils are equal, round, and reactive to light. No scleral icterus.  Neck: Normal range of motion. Neck supple.  Cardiovascular: Normal rate and regular rhythm.   No murmur heard. Pulmonary/Chest: He is in respiratory distress. He has wheezes. He has rales.  Extubated, still with coarse BS, but improving  Abdominal: Soft.  Musculoskeletal: He exhibits no edema.  Neurological: He is alert.  [id number]  Skin: Skin is warm. He is diaphoretic.        LABS   LABS:  CBC  Recent Labs Lab 08/28/15 1632 09/01/15 2131 09/03/15 0807  WBC 21.5* 18.5* 15.6*  HGB 13.1 11.2* 10.0*  HCT 38.9* 34.2* 31.7*  PLT 167 173 199   Coag's No results for input(s): APTT, INR in the last 168 hours. BMET  Recent Labs Lab 09/01/15 0541 09/02/15 0507 09/03/15 0807  NA 144 146* 144  K 4.0 4.2 5.1  CL 119* 118* 112*  CO2 19* 17* 22  BUN 76* 77* 68*  CREATININE 4.78* 4.91* 4.87*  GLUCOSE 89 100* 127*   Electrolytes  Recent Labs Lab 09/01/15 0541 09/02/15 0507 09/03/15 0807  CALCIUM 8.6* 8.6* 8.1*   Sepsis Markers No results for input(s): LATICACIDVEN, PROCALCITON, O2SATVEN in the last 168 hours. ABG  Recent Labs Lab 09/02/15 1555  PHART 7.29*  PCO2ART 46  PO2ART 126*   Liver Enzymes No results for input(s): AST, ALT, ALKPHOS, BILITOT, ALBUMIN in the last 168 hours. Cardiac Enzymes No results for input(s): TROPONINI, PROBNP in the last 168 hours. Glucose  Recent Labs Lab 09/03/15 1621 09/03/15 1949 09/03/15 2247 09/04/15 0022 09/04/15 0421 09/04/15 0722  GLUCAP 94 106* 104* 99 104* 76     Recent Results (from the past 240 hour(s))  Culture, bal-quantitative     Status: None   Collection Time: 08/25/15  3:15 PM  Result Value Ref  Range Status   Specimen Description BRONCHIAL ALVEOLAR LAVAGE  Final   Special Requests NONE  Final   Gram Stain   Final    GOOD SPECIMEN - 80-90% WBCS FEW WBC SEEN NO ORGANISMS SEEN    Culture HOLDING FOR POSSIBLE PATHOGEN  Final   Report Status 08/27/2015 FINAL  Final  Culture, blood (routine x 2)     Status: None (Preliminary result)   Collection Time: 09/02/15 12:00 PM  Result Value Ref Range Status   Specimen Description BLOOD RIGHT GROIN  Final   Special Requests   Final    BOTTLES DRAWN AEROBIC AND ANAEROBIC  6CC AERO 10CC ANAERO   Culture NO GROWTH 2 DAYS  Final   Report Status PENDING  Incomplete  Culture, blood (routine x 2)     Status: None (Preliminary  result)   Collection Time: 09/02/15 12:15 PM  Result Value Ref Range Status   Specimen Description BLOOD RIGHT GROIN  Final   Special Requests BOTTLES DRAWN AEROBIC AND ANAEROBIC  10 CC  Final   Culture NO GROWTH 2 DAYS  Final   Report Status PENDING  Incomplete  Culture, blood (routine x 2)     Status: None (Preliminary result)   Collection Time: 09/02/15  1:47 PM  Result Value Ref Range Status   Specimen Description BLOOD RIGHT HAND  Final   Special Requests BOTTLES DRAWN AEROBIC AND ANAEROBIC 4CC  Final   Culture NO GROWTH 2 DAYS  Final   Report Status PENDING  Incomplete  Culture, blood (routine x 2)     Status: None (Preliminary result)   Collection Time: 09/02/15  2:06 PM  Result Value Ref Range Status   Specimen Description BLOOD LEFT HAND  Final   Special Requests BOTTLES DRAWN AEROBIC AND ANAEROBIC 1CC  Final   Culture NO GROWTH 2 DAYS  Final   Report Status PENDING  Incomplete  MRSA PCR Screening     Status: None   Collection Time: 09/02/15 10:27 PM  Result Value Ref Range Status   MRSA by PCR NEGATIVE NEGATIVE Final    Comment:        The GeneXpert MRSA Assay (FDA approved for NASAL specimens only), is one component of a comprehensive MRSA colonization surveillance program. It is not intended to diagnose MRSA infection nor to guide or monitor treatment for MRSA infections.   Culture, expectorated sputum-assessment     Status: None   Collection Time: 09/03/15 12:18 AM  Result Value Ref Range Status   Specimen Description SPUTUM  Final   Special Requests Normal  Final   Sputum evaluation THIS SPECIMEN IS ACCEPTABLE FOR SPUTUM CULTURE  Final   Report Status 09/03/2015 FINAL  Final  Culture, respiratory (NON-Expectorated)     Status: None (Preliminary result)   Collection Time: 09/03/15 12:18 AM  Result Value Ref Range Status   Specimen Description SPUTUM  Final   Special Requests Normal Reflexed from [id number]  Final   Gram Stain   Final    EXCELLENT SPECIMEN -  90-100% WBCS MANY WBC SEEN MANY GRAM NEGATIVE COCCOBACILLI    Culture PENDING  Incomplete   Report Status PENDING  Incomplete     Current facility-administered medications:  .  acetaminophen (TYLENOL) tablet 650 mg, 650 mg, Oral, Q6H PRN, 650 mg at 09/02/15 1231 **OR** acetaminophen (TYLENOL) suppository 650 mg, 650 mg, Rectal, Q6H PRN, Vishal Mungal, MD .  anidulafungin (ERAXIS) 100 mg in sodium chloride 0.9 % 100 mL IVPB, 100 mg, Intravenous, Q24H,  Clydie Braun, MD, 100 mg at 09/03/15 2125 .  antiseptic oral rinse solution (CORINZ), 7 mL, Mouth Rinse, QID, Erin Fulling, MD, 7 mL at 09/04/15 0411 .  chlorhexidine gluconate (PERIDEX) 0.12 % solution 15 mL, 15 mL, Mouth Rinse, BID, Erin Fulling, MD, 15 mL at 09/04/15 0727 .  feeding supplement (PRO-STAT SUGAR FREE 64) liquid 30 mL, 30 mL, Oral, QID, Erin Fulling, MD, 30 mL at 09/03/15 2239 .  feeding supplement (VITAL HIGH PROTEIN) liquid 1,000 mL, 1,000 mL, Per Tube, Q24H, Erin Fulling, MD, 1,000 mL at 09/03/15 1420 .  fentaNYL in NS (37mcg/ml) infusion-PREMIX, 10 mcg/hr, Intravenous, Continuous, Erin Fulling, MD, Last Rate: 20 mL/hr at 09/04/15 0729, 200 mcg/hr at 09/04/15 0729 .  free water 30 mL, 30 mL, Per Tube, Q4H, Erin Fulling, MD, 30 mL at 09/04/15 0130 .  heparin injection 5,000 Units, 5,000 Units, Subcutaneous, 3 times per day, Katha Hamming, MD, 5,000 Units at 09/03/15 2239 .  hydrALAZINE (APRESOLINE) injection 10 mg, 10 mg, Intravenous, Q4H PRN, Oralia Manis, MD .  Influenza vac split quadrivalent PF (FLUARIX) injection 0.5 mL, 0.5 mL, Intramuscular, Prior to discharge, Katha Hamming, MD .  insulin aspart (novoLOG) injection 0-5 Units, 0-5 Units, Subcutaneous, QHS, Gale Journey, MD, 1 Units at 08/29/15 1834 .  insulin aspart (novoLOG) injection 0-9 Units, 0-9 Units, Subcutaneous, TID WC, Gale Journey, MD, 1 Units at 09/03/15 [phone] .  ipratropium-albuterol (DUONEB) 0.5-2.5 (3) MG/3ML nebulizer  solution 3 mL, 3 mL, Nebulization, Q4H, Erin Fulling, MD, 3 mL at 09/04/15 0757 .  norepinephrine (LEVOPHED) 4mg  in D5W premix infusion, 0-40 mcg/min, Intravenous, Titrated, Erin Fulling, MD, Last Rate: 11.3 mL/hr at 09/04/15 0417, 3 mcg/min at 09/04/15 0417 .  ondansetron (ZOFRAN) injection 4 mg, 4 mg, Intravenous, Q8H PRN, Vishal Mungal, MD, 4 mg at 08/28/15 1052 .  oxyCODONE (Oxy IR/ROXICODONE) immediate release tablet 5 mg, 5 mg, Oral, Q6H PRN, Oralia Manis, MD, 5 mg at 09/01/15 2125 .  pantoprazole (PROTONIX) injection 40 mg, 40 mg, Intravenous, Q2000, Erin Fulling, MD, 40 mg at 09/03/15 2051 .  piperacillin-tazobactam (ZOSYN) IVPB 3.375 g, 3.375 g, Intravenous, Q12H, Katha Hamming, MD, 3.375 g at 09/04/15 0049 .  pneumococcal 23 valent vaccine (PNU-IMMUNE) injection 0.5 mL, 0.5 mL, Intramuscular, Prior to discharge, Katha Hamming, MD .  propofol (DIPRIVAN) 1000 MG/100ML infusion, 5-80 mcg/kg/min, Intravenous, Titrated, Erin Fulling, MD, Last Rate: 31.8 mL/hr at 09/04/15 0717, 45 mcg/kg/min at 09/04/15 0717 .  traZODone (DESYREL) tablet 25 mg, 25 mg, Oral, QHS PRN, Oralia Manis, MD, 25 mg at 09/02/15 0047 .  vancomycin (VANCOCIN) 50 mg/mL oral solution 125 mg, 125 mg, Oral, 4 times per day, Stephanie Acre, MD, 125 mg at 09/04/15 0535 .  vancomycin (VANCOCIN) IVPB 1000 mg/200 mL premix, 1,000 mg, Intravenous, Q M,W,F-HD, Katha Hamming, MD  IMAGING    No results found.    Indwelling Urinary Catheter continued, requirement due to   Reason to continue Indwelling Urinary Catheter for strict Intake/Output monitoring for hemodynamic instability         Ventilator continued, requirement due to, resp failure    Ventilator Sedation RASS 0 to -2   MAJOR EVENTS/TEST RESULTS: 11/4 intubated 11/5 failed vent wean 11/8 bronchoscopy 11/9 self extubated 11/16 intubated 11/16 CXR c/w Left sided opacity likely pnuemonia  INDWELLING DEVICES:: 11/6 RIJ CVL, R Fem Vein  VasCath>>  MICRO DATA: MRSA PCR >>neg Urine 11/4>>negative Blood x 2 11/4>>No growth to date Resp BAL 11/8>>neg FLU H1N1>> NEG c  diff + (antigend and toxin) 11/5  ANTIMICROBIALS:  Oral vanc>>11/5 Flagy>>11/5 Vancomycin/Zosyn 11/16    ASSESSMENT/PLAN   49 yo AAM with acute resp failure with acute encephalopathy from acute sepsis from acute recurrent c diff colitis With acute renal failure from rhabdomyolysis with acute drug abuse with etoh abuse  Transferred back to ICU for acute resp failure from sepsis and pneumonia left sided  PULMONARY- 1.Respiratory Failure -continue Full MV support -continue Bronchodilator Therapy -Wean Fio2 and PEEP as tolerated Follow up ABg and CXR as needed -will obtain sputum culture, plan for US guided thoracentesis for effusion  CARDIOVASCULAR -blood pressure stable Vasopressors if needed-keep MAP>65   RENAL ATN from Rhabdomyolysis - ck's trending down Acute renal failure from ATN -currently on intermittent HD -appreciate nephro input  GASTROINTESTINAL GI prophylaxis  HEMATOLOGIC Follow CBC  INFECTIOUS + cdiff and HCAP - vancomycin and flagyl -pneumonia-start zosyn -obtain blood cultures and sputum culture- -started IV vanc and sosyn  ENDOCRINE - ICU hypoglycemic\Hyperglycemia protocol   NEUROLOGIC - intubated and sedated - minimal sedation to achieve a RASS goal: -1  I have personally obtained a history, examined the patient, evaluated Pertinent laboratory and RadioGraphic/imaging results, and  formulated the assessment and plan The Patient requires high complexity decision making for assessment and support, frequent evaluation and titration of therapies, application of advanced monitoring technologies and extensive interpretation of multiple databases. Critical Care Time devoted to patient care services described in this note is 35 minutes.   Overall, patient is critically ill, prognosis is guarded.    LTACH REFERRAL.  OK TO TRANSFER TO LTACH IF BED AVAILABLE   Lucie Leather, M.D.  [facility]  Medical Director [facility] Medical Director [facility] Cardio-Pulmonary Department

## 2015-09-04 NOTE — Care Management (Signed)
Patient is to have thoracentesis today. Attending will discuss transfer to [facility] today with family.

## 2015-09-04 NOTE — Progress Notes (Addendum)
Pharmacy Antibiotic Time-Out Note  Ross [name] is a 49 y.o. year-old male admitted on 08/21/2015.  The patient is currently on vancomycin po and iv and Zosyn for C diff and recurrent PNA.  Assessment/Plan: After discussion with Dr. Belia HemanKasa, the current antibiotic(s) anidulafungin, vancomycin iv, vancomycin po, and Zosyn will be narrowed to anidulafungin,  Zosyn and po vancomycin.  Temp (24hrs), Avg:98.2 F (36.8 C), Min:97 F (36.1 C), Max:98.9 F (37.2 C)   Recent Labs Lab 08/28/15 1632 09/01/15 2131 09/03/15 0807  WBC 21.5* 18.5* 15.6*    Recent Labs Lab 08/31/15 0622 09/01/15 0541 09/02/15 0507 09/03/15 0807 09/04/15 0925  CREATININE 4.85* 4.78* 4.91* 4.87* 4.92*   Estimated Creatinine Clearance: 23.9 mL/min (by C-G formula based on Cr of 4.92).   Antimicrobial allergies: None  Antimicrobials this admission: Vancomycin  11/4 >> 11/10 Zosyn 11/4 >> 11/6 PO vancomycin 11/5 >> Vancomycin 11/16 >> 11/18 Zosyn 11/16 >> anidulafungin 11/16 >>  Microbiology Results: 11/16 BCx: NTD 11/17 Sputum: Gram negative coccobacilli  11/16 MRSA PCR: negative  Thank you for allowing pharmacy to be a part of this patient's care.  Luisa Hart)Edye Hainline D PharmD 09/04/2015 12:02 PM

## 2015-09-04 NOTE — Procedures (Signed)
Left thoracentesis with difficulty  1800 ml of bloody fluid  Sample sent to lab.  Complications:  None  Blood Loss: none  See dictation in canopy pacs

## 2015-09-05 LAB — CBC
HEMATOCRIT: 26.2 % — AB (ref 40.0–52.0)
Hemoglobin: 8.7 g/dL — ABNORMAL LOW (ref 13.0–18.0)
MCH: 30.4 pg (ref 26.0–34.0)
MCHC: 33.2 g/dL (ref 32.0–36.0)
MCV: 91.5 fL (ref 80.0–100.0)
Platelets: 204 10*3/uL (ref 150–440)
RBC: 2.87 MIL/uL — ABNORMAL LOW (ref 4.40–5.90)
RDW: 14.6 % — AB (ref 11.5–14.5)
WBC: 7.4 10*3/uL (ref 3.8–10.6)

## 2015-09-05 LAB — COMPREHENSIVE METABOLIC PANEL
ALK PHOS: 208 U/L — AB (ref 38–126)
ALT: 49 U/L (ref 17–63)
AST: 40 U/L (ref 15–41)
Albumin: 1.8 g/dL — ABNORMAL LOW (ref 3.5–5.0)
BILIRUBIN TOTAL: 0.8 mg/dL (ref 0.3–1.2)
BUN: 61 mg/dL — AB (ref 6–20)
CALCIUM: 8.1 mg/dL — AB (ref 8.9–10.3)
CO2: 23 mmol/L (ref 22–32)
CREATININE: 4.54 mg/dL — AB (ref 0.61–1.24)
Chloride: 113 mmol/L — ABNORMAL HIGH (ref 101–111)
GFR calc Af Amer: 16 mL/min — ABNORMAL LOW (ref >60)
GFR, EST NON AFRICAN AMERICAN: 14 mL/min — AB (ref >60)
Glucose, Bld: 114 mg/dL — ABNORMAL HIGH (ref 65–99)
POTASSIUM: 4.9 mmol/L (ref 3.5–5.1)
Sodium: UNDETERMINED mmol/L (ref 135–145)
TOTAL PROTEIN: UNDETERMINED g/dL (ref 6.5–8.1)

## 2015-09-05 LAB — CULTURE, RESPIRATORY: SPECIAL REQUESTS: NORMAL

## 2015-09-05 LAB — GLUCOSE, CAPILLARY
GLUCOSE-CAPILLARY: 83 mg/dL (ref 65–99)
GLUCOSE-CAPILLARY: 81 mg/dL (ref 65–99)
GLUCOSE-CAPILLARY: 81 mg/dL (ref 65–99)
Glucose-Capillary: 75 mg/dL (ref 65–99)
Glucose-Capillary: 91 mg/dL (ref 65–99)
Glucose-Capillary: 98 mg/dL (ref 65–99)

## 2015-09-05 LAB — CULTURE, RESPIRATORY W GRAM STAIN

## 2015-09-05 LAB — TRIGLYCERIDES
TRIGLYCERIDES: 1872 mg/dL — AB (ref <150)
Triglycerides: 203 mg/dL — ABNORMAL HIGH (ref <150)

## 2015-09-05 MED ORDER — LEVOFLOXACIN IN D5W 750 MG/150ML IV SOLN
750.0000 mg | INTRAVENOUS | Status: AC
  Administered 2015-09-05 – 2015-09-11 (×4): 750 mg via INTRAVENOUS
  Filled 2015-09-05 (×4): qty 150

## 2015-09-05 NOTE — Care Management Note (Signed)
Case Management Note  Patient Details  Name: Pasty SpillersJames L Legate MRN: [id number] Date of Birth: Mar 21, 1966  Subjective/Objective:     Discussed discharge planning with Dr Clent RidgesWalsh who reports that Mr Eichenberger's parents have not yet made a decision about Mr Cletus GashWitherspoon going to [facility].                Action/Plan:   Expected Discharge Date:                  Expected Discharge Plan:     In-House Referral:     Discharge planning Services     Post Acute Care Choice:    Choice offered to:     DME Arranged:    DME Agency:     HH Arranged:    HH Agency:     Status of Service:     Medicare Important Message Given:  Yes Date Medicare IM Given:    Medicare IM give by:    Date Additional Medicare IM Given:    Additional Medicare Important Message give by:     If discussed at Long Length of Stay Meetings, dates discussed:    Additional Comments:  Tiffaney Heimann A, RN 09/05/2015, 12:25 PM

## 2015-09-05 NOTE — Consult Note (Signed)
ANTIBIOTIC CONSULT NOTE - follow up  Pharmacy Consult for Zosyn Indication: Sepsis  Allergies  Allergen Reactions  . Antihistamines, Diphenhydramine-Type Anaphylaxis and Rash  . Benadryl [Diphenhydramine Hcl] Anaphylaxis and Rash  . Hydroxyzine Anaphylaxis and Rash    Patient Measurements: Height:  (180.3 cm) Weight: 243 lb 13.3 oz (110.6 kg) IBW/kg (Calculated) : 75.3 Adjusted Body Weight:   Vital Signs: Temp: 99.1 F (37.3 C) (11/19 0700) Temp Source: Oral (11/19 0400) BP: 115/62 mmHg (11/19 1300) Pulse Rate: 74 (11/19 1300) Intake/Output from previous day: 11/18 0701 - 11/19 0700 In: 1949.6 [I.V.:1349.6; NG/GT:400; IV Piggyback:50] Out: 4685 [Urine:2885] Intake/Output from this shift:    Labs:  Recent Labs  09/03/15 0807 09/04/15 0925 09/05/15 0507  WBC 15.6*  --  7.4  HGB 10.0*  --  8.7*  PLT 199  --  204  CREATININE 4.87* 4.92* 4.54*   Estimated Creatinine Clearance: 25.4 mL/min (by C-G formula based on Cr of 4.54).   Microbiology: Recent Results (from the past 720 hour(s))  Blood Culture (routine x 2)     Status: None   Collection Time: 08/21/15 12:47 PM  Result Value Ref Range Status   Specimen Description BLOOD UNKNOWN  Final   Special Requests BOTTLES DRAWN AEROBIC AND ANAEROBIC  1CC  Final   Culture NO GROWTH 10 DAYS  Final   Report Status 08/31/2015 FINAL  Final  Blood Culture (routine x 2)     Status: None   Collection Time: 08/21/15 12:47 PM  Result Value Ref Range Status   Specimen Description BLOOD LEFT AC  Final   Special Requests BOTTLES DRAWN AEROBIC AND ANAEROBIC  6CC  Final   Culture NO GROWTH 10 DAYS  Final   Report Status 08/31/2015 FINAL  Final  Urine culture     Status: None   Collection Time: 08/21/15 12:47 PM  Result Value Ref Range Status   Specimen Description URINE, CATHETERIZED  Final   Special Requests Normal  Final   Culture NO GROWTH 2 DAYS  Final   Report Status 08/23/2015 FINAL  Final  MRSA PCR Screening      Status: None   Collection Time: 08/21/15 11:47 PM  Result Value Ref Range Status   MRSA by PCR NEGATIVE NEGATIVE Final    Comment:        The GeneXpert MRSA Assay (FDA approved for NASAL specimens only), is one component of a comprehensive MRSA colonization surveillance program. It is not intended to diagnose MRSA infection nor to guide or monitor treatment for MRSA infections.   C difficile quick scan w PCR reflex     Status: Abnormal   Collection Time: 08/22/15  2:31 PM  Result Value Ref Range Status   C Diff antigen POSITIVE (A) NEGATIVE Final   C Diff toxin POSITIVE (A) NEGATIVE Final   C Diff interpretation   Final    Positive for toxigenic C. difficile, active toxin production present.    Comment: CRITICAL RESULT CALLED TO, READ BACK BY AND VERIFIED WITH: CHARLIE FLEETWOOD,RN 08/22/2015 1544 BY J RAZZAKSUAREZ,MT   Culture, bal-quantitative     Status: None   Collection Time: 08/25/15  3:15 PM  Result Value Ref Range Status   Specimen Description BRONCHIAL ALVEOLAR LAVAGE  Final   Special Requests NONE  Final   Gram Stain   Final    GOOD SPECIMEN - 80-90% WBCS FEW WBC SEEN NO ORGANISMS SEEN    Culture HOLDING FOR POSSIBLE PATHOGEN  Final   Report Status  08/27/2015 FINAL  Final  Culture, blood (routine x 2)     Status: None (Preliminary result)   Collection Time: 09/02/15 12:00 PM  Result Value Ref Range Status   Specimen Description BLOOD RIGHT GROIN  Final   Special Requests   Final    BOTTLES DRAWN AEROBIC AND ANAEROBIC  6CC AERO 10CC ANAERO   Culture NO GROWTH 3 DAYS  Final   Report Status PENDING  Incomplete  Culture, blood (routine x 2)     Status: None (Preliminary result)   Collection Time: 09/02/15 12:15 PM  Result Value Ref Range Status   Specimen Description BLOOD RIGHT GROIN  Final   Special Requests BOTTLES DRAWN AEROBIC AND ANAEROBIC  10 CC  Final   Culture NO GROWTH 3 DAYS  Final   Report Status PENDING  Incomplete  Culture, blood (routine x 2)      Status: None (Preliminary result)   Collection Time: 09/02/15  1:47 PM  Result Value Ref Range Status   Specimen Description BLOOD RIGHT HAND  Final   Special Requests BOTTLES DRAWN AEROBIC AND ANAEROBIC 4CC  Final   Culture NO GROWTH 3 DAYS  Final   Report Status PENDING  Incomplete  Culture, blood (routine x 2)     Status: None (Preliminary result)   Collection Time: 09/02/15  2:06 PM  Result Value Ref Range Status   Specimen Description BLOOD LEFT HAND  Final   Special Requests BOTTLES DRAWN AEROBIC AND ANAEROBIC 1CC  Final   Culture NO GROWTH 3 DAYS  Final   Report Status PENDING  Incomplete  MRSA PCR Screening     Status: None   Collection Time: 09/02/15 10:27 PM  Result Value Ref Range Status   MRSA by PCR NEGATIVE NEGATIVE Final    Comment:        The GeneXpert MRSA Assay (FDA approved for NASAL specimens only), is one component of a comprehensive MRSA colonization surveillance program. It is not intended to diagnose MRSA infection nor to guide or monitor treatment for MRSA infections.   Culture, expectorated sputum-assessment     Status: None   Collection Time: 09/03/15 12:18 AM  Result Value Ref Range Status   Specimen Description SPUTUM  Final   Special Requests Normal  Final   Sputum evaluation THIS SPECIMEN IS ACCEPTABLE FOR SPUTUM CULTURE  Final   Report Status 09/03/2015 FINAL  Final  Culture, respiratory (NON-Expectorated)     Status: None (Preliminary result)   Collection Time: 09/03/15 12:18 AM  Result Value Ref Range Status   Specimen Description SPUTUM  Final   Special Requests Normal Reflexed from [id number]  Final   Gram Stain   Final    EXCELLENT SPECIMEN - 90-100% WBCS MANY WBC SEEN MANY GRAM NEGATIVE COCCOBACILLI    Culture   Final    MODERATE GROWTH GRAM NEGATIVE RODS IDENTIFICATION AND SUSCEPTIBILITIES TO FOLLOW    Report Status PENDING  Incomplete  Body fluid culture     Status: None (Preliminary result)   Collection Time: 09/04/15 10:30 AM   Result Value Ref Range Status   Specimen Description CYTO PLEU  Final   Special Requests NONE  Final   Gram Stain PENDING  Incomplete   Culture NO GROWTH < 24 HOURS  Final   Report Status PENDING  Incomplete    Medical History: Past Medical History  Diagnosis Date  . Sleep apnea     had surgery to correct it  . Mental disorder  PTSD  . Seizures (HCC)     currently dx with seizures  . Cardiac arrest (HCC) 08/23/2011  . Pneumonia 09/04/2011  . Pancreatitis   . SOB (shortness of breath) 09/11/2011  . Hypertension   . Anxiety   . Myocardial infarction (HCC)   . Schizophrenia (HCC)   . Rhabdomyolysis 08/23/2015  . Acute renal failure (HCC) 08/23/2015    Medications:  Scheduled:  . anidulafungin  100 mg Intravenous Q24H  . antiseptic oral rinse  7 mL Mouth Rinse QID  . chlorhexidine gluconate  15 mL Mouth Rinse BID  . feeding supplement (PRO-STAT SUGAR FREE 64)  30 mL Oral 6 times per day  . feeding supplement (VITAL HIGH PROTEIN)  1,000 mL Per Tube Q24H  . free water  30 mL Per Tube Q4H  . heparin subcutaneous  5,000 Units Subcutaneous 3 times per day  . insulin aspart  0-5 Units Subcutaneous QHS  . insulin aspart  0-9 Units Subcutaneous TID WC  . ipratropium-albuterol  3 mL Nebulization Q4H  . pantoprazole (PROTONIX) IV  40 mg Intravenous Q2000  . piperacillin-tazobactam (ZOSYN)  IV  3.375 g Intravenous Q12H  . vancomycin  125 mg Oral 4 times per day   Assessment: Pt is a 49 year old male with hypoxia, worsening renal failure requiring HD, sepsis, rhabdo, c.diff, seizures, treated with vanc (11/4-11/10) and zosyn 11/4-11/6 and po vanc 11/5 to current.   Plan:  Will continue Zosyn 3.375 g EI q 12 hours for HD. Patient also on Oral Vancomycin for Cdiff and Anidulafungin for candidemia coverage per ID.  Will continue to follow culture results. Monitor for change in HD status. No HD today per Nephrology note.   Bari Mantis PharmD Clinical  Pharmacist 09/05/2015 1:39 PM

## 2015-09-05 NOTE — Progress Notes (Signed)
ANTIBIOTIC CONSULT NOTE - INITIAL  Pharmacy Consult for Levaquin Indication: HCAP  Allergies  Allergen Reactions  . Antihistamines, Diphenhydramine-Type Anaphylaxis and Rash  . Benadryl [Diphenhydramine Hcl] Anaphylaxis and Rash  . Hydroxyzine Anaphylaxis and Rash    Patient Measurements: Height: 5\' 11"  (180.3 cm) Weight: 243 lb 13.3 oz (110.6 kg) IBW/kg (Calculated) : 75.3 Adjusted Body Weight: na  Vital Signs: Temp: 99.1 F (37.3 C) (11/19 0700) Temp Source: Oral (11/19 0400) BP: 115/62 mmHg (11/19 1300) Pulse Rate: 74 (11/19 1300) Intake/Output from previous day: 11/18 0701 - 11/19 0700 In: 1949.6 [I.V.:1349.6; NG/GT:400; IV Piggyback:50] Out: 4685 [Urine:2885] Intake/Output from this shift: Total I/O In: -  Out: 1500 [Urine:1500]  Labs:  Recent Labs  09/03/15 0807 09/04/15 0925 09/05/15 0507  WBC 15.6*  --  7.4  HGB 10.0*  --  8.7*  PLT 199  --  204  CREATININE 4.87* 4.92* 4.54*   Estimated Creatinine Clearance: 25.4 mL/min (by C-G formula based on Cr of 4.54). No results for input(s): VANCOTROUGH, VANCOPEAK, VANCORANDOM, GENTTROUGH, GENTPEAK, GENTRANDOM, TOBRATROUGH, TOBRAPEAK, TOBRARND, AMIKACINPEAK, AMIKACINTROU, AMIKACIN in the last 72 hours.   Microbiology: Recent Results (from the past 720 hour(s))  Blood Culture (routine x 2)     Status: None   Collection Time: 08/21/15 12:47 PM  Result Value Ref Range Status   Specimen Description BLOOD UNKNOWN  Final   Special Requests BOTTLES DRAWN AEROBIC AND ANAEROBIC  1CC  Final   Culture NO GROWTH 10 DAYS  Final   Report Status 08/31/2015 FINAL  Final  Blood Culture (routine x 2)     Status: None   Collection Time: 08/21/15 12:47 PM  Result Value Ref Range Status   Specimen Description BLOOD LEFT AC  Final   Special Requests BOTTLES DRAWN AEROBIC AND ANAEROBIC  6CC  Final   Culture NO GROWTH 10 DAYS  Final   Report Status 08/31/2015 FINAL  Final  Urine culture     Status: None   Collection Time:  08/21/15 12:47 PM  Result Value Ref Range Status   Specimen Description URINE, CATHETERIZED  Final   Special Requests Normal  Final   Culture NO GROWTH 2 DAYS  Final   Report Status 08/23/2015 FINAL  Final  MRSA PCR Screening     Status: None   Collection Time: 08/21/15 11:47 PM  Result Value Ref Range Status   MRSA by PCR NEGATIVE NEGATIVE Final    Comment:        The GeneXpert MRSA Assay (FDA approved for NASAL specimens only), is one component of a comprehensive MRSA colonization surveillance program. It is not intended to diagnose MRSA infection nor to guide or monitor treatment for MRSA infections.   C difficile quick scan w PCR reflex     Status: Abnormal   Collection Time: 08/22/15  2:31 PM  Result Value Ref Range Status   C Diff antigen POSITIVE (A) NEGATIVE Final   C Diff toxin POSITIVE (A) NEGATIVE Final   C Diff interpretation   Final    Positive for toxigenic C. difficile, active toxin production present.    Comment: CRITICAL RESULT CALLED TO, READ BACK BY AND VERIFIED WITH: CHARLIE FLEETWOOD,RN 08/22/2015 1544 BY J RAZZAKSUAREZ,MT   Culture, bal-quantitative     Status: None   Collection Time: 08/25/15  3:15 PM  Result Value Ref Range Status   Specimen Description BRONCHIAL ALVEOLAR LAVAGE  Final   Special Requests NONE  Final   Gram Stain   Final  GOOD SPECIMEN - 80-90% WBCS FEW WBC SEEN NO ORGANISMS SEEN    Culture HOLDING FOR POSSIBLE PATHOGEN  Final   Report Status 08/27/2015 FINAL  Final  Culture, blood (routine x 2)     Status: None (Preliminary result)   Collection Time: 09/02/15 12:00 PM  Result Value Ref Range Status   Specimen Description BLOOD RIGHT GROIN  Final   Special Requests   Final    BOTTLES DRAWN AEROBIC AND ANAEROBIC  6CC AERO 10CC ANAERO   Culture NO GROWTH 3 DAYS  Final   Report Status PENDING  Incomplete  Culture, blood (routine x 2)     Status: None (Preliminary result)   Collection Time: 09/02/15 12:15 PM  Result Value Ref  Range Status   Specimen Description BLOOD RIGHT GROIN  Final   Special Requests BOTTLES DRAWN AEROBIC AND ANAEROBIC  10 CC  Final   Culture NO GROWTH 3 DAYS  Final   Report Status PENDING  Incomplete  Culture, blood (routine x 2)     Status: None (Preliminary result)   Collection Time: 09/02/15  1:47 PM  Result Value Ref Range Status   Specimen Description BLOOD RIGHT HAND  Final   Special Requests BOTTLES DRAWN AEROBIC AND ANAEROBIC 4CC  Final   Culture NO GROWTH 3 DAYS  Final   Report Status PENDING  Incomplete  Culture, blood (routine x 2)     Status: None (Preliminary result)   Collection Time: 09/02/15  2:06 PM  Result Value Ref Range Status   Specimen Description BLOOD LEFT HAND  Final   Special Requests BOTTLES DRAWN AEROBIC AND ANAEROBIC 1CC  Final   Culture NO GROWTH 3 DAYS  Final   Report Status PENDING  Incomplete  MRSA PCR Screening     Status: None   Collection Time: 09/02/15 10:27 PM  Result Value Ref Range Status   MRSA by PCR NEGATIVE NEGATIVE Final    Comment:        The GeneXpert MRSA Assay (FDA approved for NASAL specimens only), is one component of a comprehensive MRSA colonization surveillance program. It is not intended to diagnose MRSA infection nor to guide or monitor treatment for MRSA infections.   Culture, expectorated sputum-assessment     Status: None   Collection Time: 09/03/15 12:18 AM  Result Value Ref Range Status   Specimen Description SPUTUM  Final   Special Requests Normal  Final   Sputum evaluation THIS SPECIMEN IS ACCEPTABLE FOR SPUTUM CULTURE  Final   Report Status 09/03/2015 FINAL  Final  Culture, respiratory (NON-Expectorated)     Status: None   Collection Time: 09/03/15 12:18 AM  Result Value Ref Range Status   Specimen Description SPUTUM  Final   Special Requests Normal Reflexed from [id number]  Final   Gram Stain   Final    EXCELLENT SPECIMEN - 90-100% WBCS MANY WBC SEEN MANY GRAM NEGATIVE COCCOBACILLI    Culture MODERATE  GROWTH CITROBACTER KOSERI  Final   Report Status 09/05/2015 FINAL  Final   Organism ID, Bacteria CITROBACTER KOSERI  Final      Susceptibility   Citrobacter koseri - MIC*    CEFAZOLIN <=4 SENSITIVE Sensitive     CEFTRIAXONE <=1 SENSITIVE Sensitive     CIPROFLOXACIN <=0.25 SENSITIVE Sensitive     GENTAMICIN <=1 SENSITIVE Sensitive     IMIPENEM <=0.25 SENSITIVE Sensitive     TRIMETH/SULFA <=20 SENSITIVE Sensitive     PIP/TAZO Value in next row Sensitive  SENSITIVE<=4    LEVOFLOXACIN Value in next row Sensitive      SENSITIVE<=0.12    * MODERATE GROWTH CITROBACTER KOSERI  Body fluid culture     Status: None (Preliminary result)   Collection Time: 09/04/15 10:30 AM  Result Value Ref Range Status   Specimen Description CYTO PLEU  Final   Special Requests NONE  Final   Gram Stain PENDING  Incomplete   Culture NO GROWTH < 24 HOURS  Final   Report Status PENDING  Incomplete    Medical History: Past Medical History  Diagnosis Date  . Sleep apnea     had surgery to correct it  . Mental disorder     PTSD  . Seizures (HCC)     currently dx with seizures  . Cardiac arrest (HCC) 08/23/2011  . Pneumonia 09/04/2011  . Pancreatitis   . SOB (shortness of breath) 09/11/2011  . Hypertension   . Anxiety   . Myocardial infarction (HCC)   . Schizophrenia (HCC)   . Rhabdomyolysis 08/23/2015  . Acute renal failure (HCC) 08/23/2015    Medications:  Anti-infectives    Start     Dose/Rate Route Frequency Ordered Stop   09/05/15 1800  levofloxacin (LEVAQUIN) IVPB 750 mg     750 mg 100 mL/hr over 90 Minutes Intravenous Every 48 hours 09/05/15 1544     09/04/15 1200  vancomycin (VANCOCIN) IVPB 1000 mg/200 mL premix  Status:  Discontinued     1,000 mg 200 mL/hr over 60 Minutes Intravenous Every M-W-F (Hemodialysis) 09/02/15 1418 09/04/15 1059   09/03/15 2000  anidulafungin (ERAXIS) 100 mg in sodium chloride 0.9 % 100 mL IVPB     100 mg over 90 Minutes Intravenous Every 24 hours 09/02/15  1620     09/02/15 2000  anidulafungin (ERAXIS) 200 mg in sodium chloride 0.9 % 200 mL IVPB     200 mg over 180 Minutes Intravenous  Once 09/02/15 1812 09/02/15 2307   09/02/15 1630  anidulafungin (ERAXIS) 200 mg in sodium chloride 0.9 % 200 mL IVPB  Status:  Discontinued     200 mg over 180 Minutes Intravenous  Once 09/02/15 1620 09/02/15 2132   09/02/15 1600  piperacillin-tazobactam (ZOSYN) IVPB 3.375 g  Status:  Discontinued     3.375 g 12.5 mL/hr over 240 Minutes Intravenous Every 12 hours 09/02/15 1053 09/02/15 1307   09/02/15 1430  vancomycin (VANCOCIN) 2,000 mg in sodium chloride 0.9 % 500 mL IVPB     2,000 mg 250 mL/hr over 120 Minutes Intravenous  Once 09/02/15 1416 09/02/15 1820   09/02/15 1315  piperacillin-tazobactam (ZOSYN) IVPB 3.375 g  Status:  Discontinued     3.375 g 12.5 mL/hr over 240 Minutes Intravenous Every 12 hours 09/02/15 1307 09/05/15 1447   08/29/15 1700  vancomycin (VANCOCIN) IVPB 1000 mg/200 mL premix  Status:  Discontinued     1,000 mg 200 mL/hr over 60 Minutes Intravenous every 72 hours 08/26/15 1555 08/27/15 1023   08/27/15 1000  meropenem (MERREM) 1 g in sodium chloride 0.9 % 100 mL IVPB  Status:  Discontinued     1 g 200 mL/hr over 30 Minutes Intravenous Every 24 hours 08/26/15 1555 08/27/15 1023   08/26/15 1800  vancomycin (VANCOCIN) 500 mg in sodium chloride irrigation 0.9 % 100 mL ENEMA  Status:  Discontinued     500 mg Rectal 4 times per day 08/26/15 1531 08/27/15 1023   08/26/15 1700  vancomycin (VANCOCIN) IVPB 1000 mg/200 mL premix  1,000 mg 200 mL/hr over 60 Minutes Intravenous  Once 08/26/15 1555 08/26/15 1723   08/25/15 1600  meropenem (MERREM) 1 g in sodium chloride 0.9 % 100 mL IVPB  Status:  Discontinued     1 g 200 mL/hr over 30 Minutes Intravenous Every 12 hours 08/25/15 1545 08/26/15 1555   08/25/15 1600  vancomycin (VANCOCIN) IVPB 1000 mg/200 mL premix  Status:  Discontinued     1,000 mg 200 mL/hr over 60 Minutes Intravenous Every 24  hours 08/25/15 1545 08/26/15 1555   08/24/15 1200  vancomycin (VANCOCIN) 50 mg/mL oral solution 125 mg  Status:  Discontinued     125 mg Oral 4 times per day 08/24/15 1035 09/05/15 1447   08/22/15 2000  vancomycin (VANCOCIN) IVPB 1000 mg/200 mL premix  Status:  Discontinued     1,000 mg 200 mL/hr over 60 Minutes Intravenous Every 24 hours 08/21/15 1439 08/22/15 0956   08/22/15 1800  vancomycin (VANCOCIN) 50 mg/mL oral solution 500 mg  Status:  Discontinued     500 mg Oral 4 times per day 08/22/15 1547 08/24/15 1035   08/22/15 1600  vancomycin (VANCOCIN) IVPB 1000 mg/200 mL premix  Status:  Discontinued     1,000 mg 200 mL/hr over 60 Minutes Intravenous Every 18 hours 08/22/15 0956 08/22/15 1001   08/22/15 1600  vancomycin (VANCOCIN) 1,250 mg in sodium chloride 0.9 % 250 mL IVPB  Status:  Discontinued     1,250 mg 166.7 mL/hr over 90 Minutes Intravenous Every 18 hours 08/22/15 1001 08/23/15 0827   08/22/15 1200  vancomycin (VANCOCIN) 50 mg/mL oral solution 125 mg  Status:  Discontinued     125 mg Oral 4 times per day 08/22/15 1035 08/22/15 1550   08/21/15 2200  piperacillin-tazobactam (ZOSYN) IVPB 3.375 g  Status:  Discontinued     3.375 g 12.5 mL/hr over 240 Minutes Intravenous 3 times per day 08/21/15 1431 08/23/15 0827   08/21/15 2000  vancomycin (VANCOCIN) IVPB 1000 mg/200 mL premix     1,000 mg 200 mL/hr over 60 Minutes Intravenous  Once 08/21/15 1439 08/21/15 2248   08/21/15 1300  piperacillin-tazobactam (ZOSYN) IVPB 3.375 g     3.375 g 100 mL/hr over 30 Minutes Intravenous  Once 08/21/15 1245 08/21/15 1457   08/21/15 1300  vancomycin (VANCOCIN) IVPB 1000 mg/200 mL premix     1,000 mg 200 mL/hr over 60 Minutes Intravenous  Once 08/21/15 1245 08/21/15 1457     Assessment: Patient with pan sensitive citrobacter in sputum. MD changing antibiotics from Vancomycin and Zosyn to Levaquin.  Goal of Therapy:  Resolution of symptoms  Plan:  Will order Levaquin  IV q48h for  reduced renal function.  Clovia Cuff, PharmD, BCPS 09/05/2015 3:47 PM

## 2015-09-05 NOTE — Progress Notes (Signed)
Nutrition Follow-up    INTERVENTION:   EN: with kcals provided via diprivan over past 24 hours, recommend continuing TF at current rate   NUTRITION DIAGNOSIS:   Inadequate oral intake related to acute illness as evidenced by NPO status.  GOAL:   Provide needs based on ASPEN/SCCM guidelines  MONITOR:    (Energy intake, Digestive system, Electrolyte and renal profile)  REASON FOR ASSESSMENT:   Consult Enteral/tube feeding initiation and management  ASSESSMENT:    Pt remains on vent, no plan for HD today  Diet Order:  Diet NPO time specified  EN: tolerating Vital High Protein at rate of 20 ml/hr with Prostat 6 times per day  Digestive System: no signs of TF intolerance, last BM 11/16  Electrolyte and Renal Profile:  Recent Labs Lab 09/03/15 0807 09/04/15 0925 09/05/15 0507  BUN 68* 66* 61*  CREATININE 4.87* 4.92* 4.54*  NA 144 143 UNABLE TO PERFORM DUE TO LIPEMIC INTERFERENCE  K 5.1 4.8 4.9   Glucose Profile:  Recent Labs  09/05/15 0032 09/05/15 0404 09/05/15 0740  GLUCAP 98 91 83   Meds: ss novolog, levophed, diprivan (738 kcals in past 24 hours) Height:   Ht Readings from Last 1 Encounters:  08/21/15 5\' 11"  (1.803 m)    Weight:   Wt Readings from Last 1 Encounters:  09/05/15 243 lb 13.3 oz (110.6 kg)    BMI:  Body mass index is 34.02 kg/(m^2).  Estimated Nutritional Needs:   Kcal:  [id number] kcals/d (11-14 kcals/kg Actual BW 117kg)  Protein:  (2.0-2.5 g/kg) Using IBW 78 kg 156-195 g/d  Fluid:  (1000ml + UOP)  EDUCATION NEEDS:   No education needs identified at this time  HIGH Care Level  Romelle Starcherate Sherrina Zaugg MS, RD, LDN [phone] Pager

## 2015-09-05 NOTE — Progress Notes (Signed)
Subjective:  Pt remains critically ill. Pt's mother declined transfer to LTAC. Remains on the vent at the moment. Pt has good urine outpt.    Objective:  Vital signs in last 24 hours:  Temp:  [98 F (36.7 C)-99.1 F (37.3 C)] 99.1 F (37.3 C) (11/19 0700) Pulse Rate:  [74-94] 77 (11/19 0700) Resp:  [19-34] 20 (11/19 0700) BP: (99-122)/(58-75) 113/63 mmHg (11/19 0700) SpO2:  [97 %-100 %] 98 % (11/19 0700) FiO2 (%):  [35 %-40 %] 35 % (11/19 0747) Weight:  [110.6 kg (243 lb 13.3 oz)] 110.6 kg (243 lb 13.3 oz) (11/19 0400)  Weight change: -3.9 kg (-8 lb 9.6 oz) Filed Weights   09/03/15 0500 09/04/15 0400 09/05/15 0400  Weight: 117 kg (257 lb 15 oz) 114.5 kg (252 lb 6.8 oz) 110.6 kg (243 lb 13.3 oz)    Intake/Output:    Intake/Output Summary (Last 24 hours) at 09/05/15 0817 Last data filed at 09/05/15 0539  Gross per 24 hour  Intake 1877.8 ml  Output   4560 ml  Net -2682.2 ml     Physical Exam: General:  critically ill appearing  HEENT  [place]/AT ETT in place  Neck  supple  Pulm/lungs  bilateral rhonchi, vent assisted  CVS/Heart  S1S2 no rubs  Abdomen:   soft, +bowel sounds, nontender  Extremities:  1+ b/l LE edema  Neurologic:  on the vent, sedated.  Skin:  no acute rashes  Access: Right femoral non tunneled catheter Dr. Belia Heman 11/6       Basic Metabolic Panel:   Recent Labs Lab 09/01/15 0541 09/02/15 0507 09/03/15 0807 09/04/15 0925 09/05/15 0507  NA 144 146* 144 143 UNABLE TO PERFORM DUE TO LIPEMIC INTERFERENCE  K 4.0 4.2 5.1 4.8 4.9  CL 119* 118* 112* 112* 113*  CO2 19* 17* GLUCOSE 89 100* 127* 96 114*  BUN 76* 77* 68* 66* 61*  CREATININE 4.78* 4.91* 4.87* 4.92* 4.54*  CALCIUM 8.6* 8.6* 8.1* 8.1* 8.1*     CBC:  Recent Labs Lab 09/01/15 2131 09/03/15 0807 09/05/15 0507  WBC 18.5* 15.6* 7.4  HGB 11.2* 10.0* 8.7*  HCT 34.2* 31.7* 26.2*  MCV 87.8 90.9 91.5  PLT 173 199 204      Microbiology:  Recent Results (from the past 720  hour(s))  Blood Culture (routine x 2)     Status: None   Collection Time: 08/21/15 12:47 PM  Result Value Ref Range Status   Specimen Description BLOOD UNKNOWN  Final   Special Requests BOTTLES DRAWN AEROBIC AND ANAEROBIC  1CC  Final   Culture NO GROWTH 10 DAYS  Final   Report Status 08/31/2015 FINAL  Final  Blood Culture (routine x 2)     Status: None   Collection Time: 08/21/15 12:47 PM  Result Value Ref Range Status   Specimen Description BLOOD LEFT AC  Final   Special Requests BOTTLES DRAWN AEROBIC AND ANAEROBIC  6CC  Final   Culture NO GROWTH 10 DAYS  Final   Report Status 08/31/2015 FINAL  Final  Urine culture     Status: None   Collection Time: 08/21/15 12:47 PM  Result Value Ref Range Status   Specimen Description URINE, CATHETERIZED  Final   Special Requests Normal  Final   Culture NO GROWTH 2 DAYS  Final   Report Status 08/23/2015 FINAL  Final  MRSA PCR Screening     Status: None   Collection Time: 08/21/15 11:47 PM  Result Value Ref  Range Status   MRSA by PCR NEGATIVE NEGATIVE Final    Comment:        The GeneXpert MRSA Assay (FDA approved for NASAL specimens only), is one component of a comprehensive MRSA colonization surveillance program. It is not intended to diagnose MRSA infection nor to guide or monitor treatment for MRSA infections.   C difficile quick scan w PCR reflex     Status: Abnormal   Collection Time: 08/22/15  2:31 PM  Result Value Ref Range Status   C Diff antigen POSITIVE (A) NEGATIVE Final   C Diff toxin POSITIVE (A) NEGATIVE Final   C Diff interpretation   Final    Positive for toxigenic C. difficile, active toxin production present.    Comment: CRITICAL RESULT CALLED TO, READ BACK BY AND VERIFIED WITH: CHARLIE FLEETWOOD,RN 08/22/2015 1544 BY J RAZZAKSUAREZ,MT   Culture, bal-quantitative     Status: None   Collection Time: 08/25/15  3:15 PM  Result Value Ref Range Status   Specimen Description BRONCHIAL ALVEOLAR LAVAGE  Final   Special  Requests NONE  Final   Gram Stain   Final    GOOD SPECIMEN - 80-90% WBCS FEW WBC SEEN NO ORGANISMS SEEN    Culture HOLDING FOR POSSIBLE PATHOGEN  Final   Report Status 08/27/2015 FINAL  Final  Culture, blood (routine x 2)     Status: None (Preliminary result)   Collection Time: 09/02/15 12:00 PM  Result Value Ref Range Status   Specimen Description BLOOD RIGHT GROIN  Final   Special Requests   Final    BOTTLES DRAWN AEROBIC AND ANAEROBIC  6CC AERO 10CC ANAERO   Culture NO GROWTH 2 DAYS  Final   Report Status PENDING  Incomplete  Culture, blood (routine x 2)     Status: None (Preliminary result)   Collection Time: 09/02/15 12:15 PM  Result Value Ref Range Status   Specimen Description BLOOD RIGHT GROIN  Final   Special Requests BOTTLES DRAWN AEROBIC AND ANAEROBIC  10 CC  Final   Culture NO GROWTH 2 DAYS  Final   Report Status PENDING  Incomplete  Culture, blood (routine x 2)     Status: None (Preliminary result)   Collection Time: 09/02/15  1:47 PM  Result Value Ref Range Status   Specimen Description BLOOD RIGHT HAND  Final   Special Requests BOTTLES DRAWN AEROBIC AND ANAEROBIC 4CC  Final   Culture NO GROWTH 2 DAYS  Final   Report Status PENDING  Incomplete  Culture, blood (routine x 2)     Status: None (Preliminary result)   Collection Time: 09/02/15  2:06 PM  Result Value Ref Range Status   Specimen Description BLOOD LEFT HAND  Final   Special Requests BOTTLES DRAWN AEROBIC AND ANAEROBIC 1CC  Final   Culture NO GROWTH 2 DAYS  Final   Report Status PENDING  Incomplete  MRSA PCR Screening     Status: None   Collection Time: 09/02/15 10:27 PM  Result Value Ref Range Status   MRSA by PCR NEGATIVE NEGATIVE Final    Comment:        The GeneXpert MRSA Assay (FDA approved for NASAL specimens only), is one component of a comprehensive MRSA colonization surveillance program. It is not intended to diagnose MRSA infection nor to guide or monitor treatment for MRSA  infections.   Culture, expectorated sputum-assessment     Status: None   Collection Time: 09/03/15 12:18 AM  Result Value Ref Range Status  Specimen Description SPUTUM  Final   Special Requests Normal  Final   Sputum evaluation THIS SPECIMEN IS ACCEPTABLE FOR SPUTUM CULTURE  Final   Report Status 09/03/2015 FINAL  Final  Culture, respiratory (NON-Expectorated)     Status: None (Preliminary result)   Collection Time: 09/03/15 12:18 AM  Result Value Ref Range Status   Specimen Description SPUTUM  Final   Special Requests Normal Reflexed from [id number]  Final   Gram Stain   Final    EXCELLENT SPECIMEN - 90-100% WBCS MANY WBC SEEN MANY GRAM NEGATIVE COCCOBACILLI    Culture   Final    MODERATE GROWTH GRAM NEGATIVE RODS IDENTIFICATION AND SUSCEPTIBILITIES TO FOLLOW    Report Status PENDING  Incomplete    Coagulation Studies: No results for input(s): LABPROT, INR in the last 72 hours.  Urinalysis: No results for input(s): COLORURINE, LABSPEC, PHURINE, GLUCOSEU, HGBUR, BILIRUBINUR, KETONESUR, PROTEINUR, UROBILINOGEN, NITRITE, LEUKOCYTESUR in the last 72 hours.  Invalid input(s): APPERANCEUR    Imaging: Dg Chest 1 View  09/04/2015  CLINICAL DATA:  Status post left thoracentesis for 1.8 L EXAM: CHEST  1 VIEW COMPARISON:  11/16/6 FINDINGS: Persisting consolidation is noted in the left lower lobe. A moderate pleural effusion is noted but decreased from the prior exam. The residual fluid is likely related to some fibrin cross linking precluding complete drainage. No pneumothorax is noted. The endotracheal tube, nasogastric catheter and left-sided PICC line are in stable position. The right jugular central line is been removed in the interval. The right lung remains clear. IMPRESSION: Reduction in pleural effusion on the left. No pneumothorax is noted. Electronically Signed   By: Alcide Clever M.D.   On: 09/04/2015 11:22   US Thoracentesis Asp Pleural Space W/img Guide  09/04/2015  CLINICAL  DATA:  Left pleural effusion EXAM: ULTRASOUND GUIDED left THORACENTESIS PROCEDURE: An ultrasound guided thoracentesis was thoroughly discussed with the patient and questions answered. The benefits, risks, alternatives and complications were also discussed. The patient understands and wishes to proceed with the procedure. Written consent was obtained. Ultrasound was performed to localize and mark an adequate pocket of fluid in the left chest. The area was then prepped and draped in the normal sterile fashion. 1% Lidocaine was used for local anesthesia. Under ultrasound guidance a 6 French thoracentesis catheter was introduced. Thoracentesis was performed. The catheter was removed and a dressing applied. COMPLICATIONS: None FINDINGS: A total of approximately 1800 mL of bloody fluid was removed. A fluid sample was sent for laboratory analysis. IMPRESSION: Successful ultrasound guided left thoracentesis yielding 1.8 L of pleural fluid. Electronically Signed   By: Alcide Clever M.D.   On: 09/04/2015 11:21     Medications:   . fentaNYL infusion INTRAVENOUS 200 mcg/hr (09/05/15 0812)  . norepinephrine 3.013 mcg/min (09/05/15 0520)  . propofol (DIPRIVAN) infusion 45 mcg/kg/min (09/05/15 0812)   . anidulafungin  100 mg Intravenous Q24H  . antiseptic oral rinse  7 mL Mouth Rinse QID  . chlorhexidine gluconate  15 mL Mouth Rinse BID  . feeding supplement (PRO-STAT SUGAR FREE 64)  30 mL Oral 6 times per day  . feeding supplement (VITAL HIGH PROTEIN)  1,000 mL Per Tube Q24H  . free water  30 mL Per Tube Q4H  . heparin subcutaneous  5,000 Units Subcutaneous 3 times per day  . insulin aspart  0-5 Units Subcutaneous QHS  . insulin aspart  0-9 Units Subcutaneous TID WC  . ipratropium-albuterol  3 mL Nebulization Q4H  . pantoprazole (PROTONIX) IV  40 mg Intravenous Q2000  . piperacillin-tazobactam (ZOSYN)  IV  3.375 g Intravenous Q12H  . vancomycin  125 mg Oral 4 times per day   acetaminophen **OR**  acetaminophen, hydrALAZINE, Influenza vac split quadrivalent PF, ondansetron (ZOFRAN) IV, oxyCODONE, pneumococcal 23 valent vaccine, traZODone  Assessment/ Plan:  49 y.o. male With alcohol abuse, schizophrenia, seizure disorder, coronary disease/MI 2012 was admitted to [facility] on November 4 after being found unresponsive in his apartment. Urine toxicology screen is positive for benzodiazepines and cannabinoids. Stool positive for C. Difficile  1. Acute renal failure, likely secondary to ATN from concurrent illness, volume depletion, C. difficile, rhabdomyolysis and sepsis. Has had 4 HD treatments. -Pt resting comfortably on the vent at the moment, Cr still high, but UOP good.  Continue pressors to maintain a MAP of 65.  No indication for HD at the moment.  2. Acute rhabdomyolysis M62.82: resolved with conservative management.  3.  Hypernatremia. Na not calculated due to lipemia.  4.  Hypokalemia. Resolved.   5. Infective Colitis A09: c. Diff positive.  - continue PO vancomycin.  6.  Acute respiratory failure due to bacterial pneumonia/Left pleural effusion s/p thoracentesis 11/18. -had thoracentesis yesterday, 1.8 liters removed, continue vent support at this time.  Abx therapy per pulm/cc.  LOS: 15 Daevon Holdren 11/19/20168:17 AM

## 2015-09-05 NOTE — Consult Note (Signed)
[facility]  Pulmonary Medicine Consultation      Name: Ross [name] MRN: [id number] DOB: [birth year]    ADMISSION DATE:  08/21/2015   CHIEF COMPLAINT:    acute resp failure   SUBJECTIVE   Remains intubated,sedated fio2 40% On vasopressors, plan for US guided thoracentesis today by IR Mother has provided consent  SIGNIFICANT EVENTS   11/4 intubated 11/5 failed vent wean due to encephalopathy and acidosis 11/6 RIJ CVL, R Fem Vein VasCath 11/8 bronchoscopy with BAL 11/9 self extubated 11/15 transferred back to ICU 11/15 intubated  Review of Systems  Unable to perform ROS: critical illness      VITAL SIGNS    Temp:  [98 F (36.7 C)-99.1 F (37.3 C)] 99.1 F (37.3 C) (11/19 0700) Pulse Rate:  [74-94] 77 (11/19 0700) Resp:  [19-34] 20 (11/19 0700) BP: (100-122)/(58-75) 113/63 mmHg (11/19 0700) SpO2:  [97 %-100 %] 98 % (11/19 0700) FiO2 (%):  [35 %-40 %] 35 % (11/19 0747) Weight:  [243 lb 13.3 oz (110.6 kg)] 243 lb 13.3 oz (110.6 kg) (11/19 0400) HEMODYNAMICS:   VENTILATOR SETTINGS: Vent Mode:  [-] PRVC FiO2 (%):  [35 %-40 %] 35 % Set Rate:  [20 bmp] 20 bmp Vt Set:  [500 mL] 500 mL PEEP:  [5 cmH20] 5 cmH20 INTAKE / OUTPUT:  Intake/Output Summary (Last 24 hours) at 09/05/15 1040 Last data filed at 09/05/15 0539  Gross per 24 hour  Intake 1714.2 ml  Output   2510 ml  Net -795.8 ml       PHYSICAL EXAM   Physical Exam  Constitutional: No distress.  HENT:  Head: Normocephalic and atraumatic.  Eyes: Pupils are equal, round, and reactive to light. No scleral icterus.  Neck: Normal range of motion. Neck supple.  Cardiovascular: Normal rate and regular rhythm.   No murmur heard. Pulmonary/Chest: He is in respiratory distress. He has wheezes. He has rales.  Extubated, still with coarse BS, but improving  Abdominal: Soft.  Musculoskeletal: He exhibits no edema.  Neurological: He is alert.  [id number]  Skin: Skin is warm. He is not diaphoretic.        LABS   LABS:  CBC  Recent Labs Lab 09/01/15 2131 09/03/15 0807 09/05/15 0507  WBC 18.5* 15.6* 7.4  HGB 11.2* 10.0* 8.7*  HCT 34.2* 31.7* 26.2*  PLT 173 199 204   Coag's No results for input(s): APTT, INR in the last 168 hours. BMET  Recent Labs Lab 09/03/15 0807 09/04/15 0925 09/05/15 0507  NA 144 143 UNABLE TO PERFORM DUE TO LIPEMIC INTERFERENCE  K 5.1 4.8 4.9  CL 112* 112* 113*  CO2 BUN 68* 66* 61*  CREATININE 4.87* 4.92* 4.54*  GLUCOSE 127* 96 114*   Electrolytes  Recent Labs Lab 09/03/15 0807 09/04/15 0925 09/05/15 0507  CALCIUM 8.1* 8.1* 8.1*   Sepsis Markers No results for input(s): LATICACIDVEN, PROCALCITON, O2SATVEN in the last 168 hours. ABG  Recent Labs Lab 09/02/15 1555  PHART 7.29*  PCO2ART 46  PO2ART 126*   Liver Enzymes  Recent Labs Lab 09/05/15 0507  AST 40  ALT 49  ALKPHOS 208*  BILITOT 0.8  ALBUMIN 1.8*   Cardiac Enzymes No results for input(s): TROPONINI, PROBNP in the last 168 hours. Glucose  Recent Labs Lab 09/04/15 1128 09/04/15 1610 09/04/15 1936 09/05/15 0032 09/05/15 0404 09/05/15 0740  GLUCAP 75 88 83 98 91 83     Recent Results (from the past 240 hour(s))  Culture, blood (routine  x 2)     Status: None (Preliminary result)   Collection Time: 09/02/15 12:00 PM  Result Value Ref Range Status   Specimen Description BLOOD RIGHT GROIN  Final   Special Requests   Final    BOTTLES DRAWN AEROBIC AND ANAEROBIC  6CC AERO 10CC ANAERO   Culture NO GROWTH 3 DAYS  Final   Report Status PENDING  Incomplete  Culture, blood (routine x 2)     Status: None (Preliminary result)   Collection Time: 09/02/15 12:15 PM  Result Value Ref Range Status   Specimen Description BLOOD RIGHT GROIN  Final   Special Requests BOTTLES DRAWN AEROBIC AND ANAEROBIC  10 CC  Final   Culture NO GROWTH 3 DAYS  Final   Report Status PENDING  Incomplete  Culture, blood (routine x 2)     Status: None (Preliminary result)    Collection Time: 09/02/15  1:47 PM  Result Value Ref Range Status   Specimen Description BLOOD RIGHT HAND  Final   Special Requests BOTTLES DRAWN AEROBIC AND ANAEROBIC 4CC  Final   Culture NO GROWTH 3 DAYS  Final   Report Status PENDING  Incomplete  Culture, blood (routine x 2)     Status: None (Preliminary result)   Collection Time: 09/02/15  2:06 PM  Result Value Ref Range Status   Specimen Description BLOOD LEFT HAND  Final   Special Requests BOTTLES DRAWN AEROBIC AND ANAEROBIC 1CC  Final   Culture NO GROWTH 3 DAYS  Final   Report Status PENDING  Incomplete  MRSA PCR Screening     Status: None   Collection Time: 09/02/15 10:27 PM  Result Value Ref Range Status   MRSA by PCR NEGATIVE NEGATIVE Final    Comment:        The GeneXpert MRSA Assay (FDA approved for NASAL specimens only), is one component of a comprehensive MRSA colonization surveillance program. It is not intended to diagnose MRSA infection nor to guide or monitor treatment for MRSA infections.   Culture, expectorated sputum-assessment     Status: None   Collection Time: 09/03/15 12:18 AM  Result Value Ref Range Status   Specimen Description SPUTUM  Final   Special Requests Normal  Final   Sputum evaluation THIS SPECIMEN IS ACCEPTABLE FOR SPUTUM CULTURE  Final   Report Status 09/03/2015 FINAL  Final  Culture, respiratory (NON-Expectorated)     Status: None (Preliminary result)   Collection Time: 09/03/15 12:18 AM  Result Value Ref Range Status   Specimen Description SPUTUM  Final   Special Requests Normal Reflexed from [id number]  Final   Gram Stain   Final    EXCELLENT SPECIMEN - 90-100% WBCS MANY WBC SEEN MANY GRAM NEGATIVE COCCOBACILLI    Culture   Final    MODERATE GROWTH GRAM NEGATIVE RODS IDENTIFICATION AND SUSCEPTIBILITIES TO FOLLOW    Report Status PENDING  Incomplete     Current facility-administered medications:  .  acetaminophen (TYLENOL) tablet 650 mg, 650 mg, Oral, Q6H PRN, 650 mg at  09/02/15 1231 **OR** acetaminophen (TYLENOL) suppository 650 mg, 650 mg, Rectal, Q6H PRN, Vishal Mungal, MD .  anidulafungin (ERAXIS) 100 mg in sodium chloride 0.9 % 100 mL IVPB, 100 mg, Intravenous, Q24H, Clydie Braunavid Fitzgerald, MD, 100 mg at 09/04/15 1947 .  antiseptic oral rinse solution (CORINZ), 7 mL, Mouth Rinse, QID, Erin FullingKurian Kasa, MD, 7 mL at 09/05/15 0400 .  chlorhexidine gluconate (PERIDEX) 0.12 % solution 15 mL, 15 mL, Mouth Rinse, BID, Erin FullingKurian Kasa, MD, 15  mL at 09/05/15 0735 .  feeding supplement (PRO-STAT SUGAR FREE 64) liquid 30 mL, 30 mL, Oral, 6 times per day, Erin Fulling, MD, 30 mL at 09/05/15 0735 .  feeding supplement (VITAL HIGH PROTEIN) liquid 1,000 mL, 1,000 mL, Per Tube, Q24H, Erin Fulling, MD, 1,000 mL at 09/04/15 1606 .  fentaNYL in NS (35mcg/ml) infusion-PREMIX, 10 mcg/hr, Intravenous, Continuous, Erin Fulling, MD, Last Rate: 20 mL/hr at 09/05/15 0812, 200 mcg/hr at 09/05/15 0812 .  free water 30 mL, 30 mL, Per Tube, Q4H, Erin Fulling, MD, 30 mL at 09/05/15 0930 .  heparin injection 5,000 Units, 5,000 Units, Subcutaneous, 3 times per day, Katha Hamming, MD, 5,000 Units at 09/05/15 0531 .  hydrALAZINE (APRESOLINE) injection 10 mg, 10 mg, Intravenous, Q4H PRN, Oralia Manis, MD .  Influenza vac split quadrivalent PF (FLUARIX) injection 0.5 mL, 0.5 mL, Intramuscular, Prior to discharge, Katha Hamming, MD .  insulin aspart (novoLOG) injection 0-5 Units, 0-5 Units, Subcutaneous, QHS, Gale Journey, MD, 1 Units at 08/29/15 1834 .  insulin aspart (novoLOG) injection 0-9 Units, 0-9 Units, Subcutaneous, TID WC, Gale Journey, MD, 1 Units at 09/03/15 [phone] .  ipratropium-albuterol (DUONEB) 0.5-2.5 (3) MG/3ML nebulizer solution 3 mL, 3 mL, Nebulization, Q4H, Erin Fulling, MD, 3 mL at 09/05/15 0713 .  norepinephrine (LEVOPHED)  in D5W premix infusion, 0-40 mcg/min, Intravenous, Titrated, Erin Fulling, MD, Last Rate: 11.3 mL/hr at 09/05/15 0520, 3.013 mcg/min at  09/05/15 0520 .  ondansetron (ZOFRAN) injection 4 mg, 4 mg, Intravenous, Q8H PRN, Vishal Mungal, MD, 4 mg at 08/28/15 1052 .  oxyCODONE (Oxy IR/ROXICODONE) immediate release tablet 5 mg, 5 mg, Oral, Q6H PRN, Oralia Manis, MD, 5 mg at 09/01/15 2125 .  pantoprazole (PROTONIX) injection 40 mg, 40 mg, Intravenous, Q2000, Erin Fulling, MD, 40 mg at 09/04/15 1948 .  piperacillin-tazobactam (ZOSYN) IVPB 3.375 g, 3.375 g, Intravenous, Q12H, Katha Hamming, MD, 3.375 g at 09/05/15 0052 .  pneumococcal 23 valent vaccine (PNU-IMMUNE) injection 0.5 mL, 0.5 mL, Intramuscular, Prior to discharge, Katha Hamming, MD .  propofol (DIPRIVAN) 1000 MG/100ML infusion, 5-80 mcg/kg/min, Intravenous, Titrated, Erin Fulling, MD, Last Rate: 31.8 mL/hr at 09/05/15 0812, 45 mcg/kg/min at 09/05/15 0812 .  traZODone (DESYREL) tablet 25 mg, 25 mg, Oral, QHS PRN, Oralia Manis, MD, 25 mg at 09/02/15 0047 .  vancomycin (VANCOCIN) 50 mg/mL oral solution 125 mg, 125 mg, Oral, 4 times per day, Erin Fulling, MD, 125 mg at 09/05/15 0531  IMAGING        Indwelling Urinary Catheter continued, requirement due to   Reason to continue Indwelling Urinary Catheter for strict Intake/Output monitoring for hemodynamic instability         Ventilator continued, requirement due to, resp failure    Ventilator Sedation RASS 0 to -2   MAJOR EVENTS/TEST RESULTS: 11/4 intubated 11/5 failed vent wean 11/8 bronchoscopy 11/9 self extubated 11/16 intubated 11/16 CXR c/w Left sided opacity likely pnuemonia  INDWELLING DEVICES:: 11/6 RIJ CVL, R Fem Vein VasCath>>  MICRO DATA: MRSA PCR >>neg Urine 11/4>>negative Blood x 2 11/4>>No growth to date Resp BAL 11/8>>neg FLU H1N1>> NEG c diff + (antigend and toxin) 11/5  ANTIMICROBIALS:  Oral vanc>>11/5 Flagy>>11/5 Vancomycin/Zosyn 11/16    ASSESSMENT/PLAN   49 yo AAM with acute resp failure with acute encephalopathy from acute sepsis from acute recurrent c diff  colitis With acute renal failure from rhabdomyolysis with acute drug abuse with etoh abuse  Transferred back to ICU for acute resp failure from sepsis  and pneumonia left sided  PULMONARY- 1.Respiratory Failure -continue Full MV support -continue Bronchodilator Therapy -Wean Fio2 and PEEP as tolerated Follow up ABg and CXR as needed -s/p US guided thoracentesis for effusion, consistent with exudative effusion,   CARDIOVASCULAR -blood pressure stable Vasopressors if needed-keep MAP>65   RENAL ATN from Rhabdomyolysis - ck's trending down Acute renal failure from ATN -currently on intermittent HD -appreciate nephro input  GASTROINTESTINAL GI prophylaxis  HEMATOLOGIC Follow CBC  INFECTIOUS + cdiff and HCAP - vancomycin and flagyl -pneumonia-on zosyn and vancomycin -obtain blood cultures and sputum culture-   ENDOCRINE - ICU hypoglycemic\Hyperglycemia protocol   NEUROLOGIC - intubated and sedated - minimal sedation to achieve a RASS goal: -1   Critical Care Attestation.  I have personally obtained a history, examined the patient, evaluated laboratory and imaging results, formulated the assessment and plan and placed orders. The Patient requires high complexity decision making for assessment and support, frequent evaluation and titration of therapies, application of advanced monitoring technologies and extensive interpretation of multiple databases. The patient has critical illness that could lead imminently to failure of 1 or more organ systems and requires the highest level of physician preparedness to intervene.  Critical Care Time devoted to patient care services described in this note is 35 minutes and is exclusive of time spent in procedures.      Wells Guiles, M.D.

## 2015-09-05 NOTE — Progress Notes (Signed)
[facility] - [place] at [facility]   PATIENT NAME: Ross [name]    MR#:  [id number]  DATE OF BIRTH:  [birth year]    CHIEF COMPLAINT:   Chief Complaint  Patient presents with  . Seizures   Intubated. Sedated.  REVIEW OF SYSTEMS:   Review of Systems  Unable to perform ROS: intubated  Constitutional: Negative for fever and chills.  HENT: Negative for hearing loss.   Eyes: Negative for blurred vision, double vision and photophobia.  Respiratory: Negative for cough, hemoptysis, sputum production and wheezing.   Cardiovascular: Negative for chest pain, palpitations, orthopnea and leg swelling.  Gastrointestinal: Negative for nausea, vomiting, abdominal pain and diarrhea.  Genitourinary: Negative for dysuria and urgency.  Musculoskeletal: Negative for neck pain.  Skin: Negative for rash.  Neurological: Negative for dizziness, focal weakness, seizures, weakness and headaches.  Psychiatric/Behavioral: Negative for memory loss. The patient does not have insomnia.     Have anxiety, PTSD issues. Received some Haldol for hallucinations. DRUG ALLERGIES:   Allergies  Allergen Reactions  . Antihistamines, Diphenhydramine-Type Anaphylaxis and Rash  . Benadryl [Diphenhydramine Hcl] Anaphylaxis and Rash  . Hydroxyzine Anaphylaxis and Rash    VITALS:  Blood pressure 115/62, pulse 74, temperature 99.1 F (37.3 C), temperature source Oral, resp. rate 20, height  (1.803 m), weight 110.6 kg (243 lb 13.3 oz), SpO2 99 %.  PHYSICAL EXAMINATION:  GENERAL:  49 y.o.-year-old patient   seen in the room, currently intubated, sedated.  PERLA; ENT;orally intubated.  Pulmonary ; his breath sounds are coarse, good air movement   CARDIOVASCULAR: S1, S2 normal. No murmurs, rubs, or gallops. ABDOMEN: Soft, nontender, nondistended. Bowel sounds present. No organomegaly or mass.  EXTREMITIES: left leg edema. cyanosis, or clubbing. Peripheral pulses 2+ NEUROLOGIC:  intubated/sedated.  SKIN: No obvious rash, lesion, or ulcer.  Bilateral leg edema  Psych;.'intubated/sedated.  LABORATORY PANEL:   CBC  Recent Labs Lab 09/05/15 0507  WBC 7.4  HGB 8.7*  HCT 26.2*  PLT 204   ------------------------------------------------------------------------------------------------------------------  Chemistries   Recent Labs Lab 09/05/15 0507  NA UNABLE TO PERFORM DUE TO LIPEMIC INTERFERENCE  K 4.9  CL 113*  CO2 23  GLUCOSE 114*  BUN 61*  CREATININE 4.54*  CALCIUM 8.1*  AST 40  ALT 49  ALKPHOS 208*  BILITOT 0.8   ------------------------------------------------------------------------------------------------------------------  Cardiac Enzymes No results for input(s): TROPONINI in the last 168 hours. ------------------------------------------------------------------------------------------------------------------  RADIOLOGY:  Dg Chest 1 View  09/04/2015  CLINICAL DATA:  Status post left thoracentesis for 1.8 L EXAM: CHEST  1 VIEW COMPARISON:  11/16/6 FINDINGS: Persisting consolidation is noted in the left lower lobe. A moderate pleural effusion is noted but decreased from the prior exam. The residual fluid is likely related to some fibrin cross linking precluding complete drainage. No pneumothorax is noted. The endotracheal tube, nasogastric catheter and left-sided PICC line are in stable position. The right jugular central line is been removed in the interval. The right lung remains clear. IMPRESSION: Reduction in pleural effusion on the left. No pneumothorax is noted. Electronically Signed   By: Alcide Clever M.D.   On: 09/04/2015 11:22   US Thoracentesis Asp Pleural Space W/img Guide  09/04/2015  CLINICAL DATA:  Left pleural effusion EXAM: ULTRASOUND GUIDED left THORACENTESIS PROCEDURE: An ultrasound guided thoracentesis was thoroughly discussed with the patient and questions answered. The benefits, risks, alternatives and complications were  also discussed. The patient understands and wishes to proceed with the procedure. Written consent was obtained. Ultrasound  was performed to localize and mark an adequate pocket of fluid in the left chest. The area was then prepped and draped in the normal sterile fashion. 1% Lidocaine was used for local anesthesia. Under ultrasound guidance a 6 French thoracentesis catheter was introduced. Thoracentesis was performed. The catheter was removed and a dressing applied. COMPLICATIONS: None FINDINGS: A total of approximately 1800 mL of bloody fluid was removed. A fluid sample was sent for laboratory analysis. IMPRESSION: Successful ultrasound guided left thoracentesis yielding 1.8 L of pleural fluid. Electronically Signed   By: Alcide CleverMark  Lukens M.D.   On: 09/04/2015 11:21    EKG:   Orders placed or performed during the hospital encounter of 08/21/15  . EKG 12-Lead  . EKG 12-Lead    ASSESSMENT AND PLAN:    #1 acute respiratory failure with hypoxia and altered mental status:  Recurrent acute respiratory failure right now has pneumonia. Sputum cultures are growing pansensitive Citrobacter. Blood cultures are negative. Infectious disease following. We will transition from vancomycin and Zosyn to Levaquin at this time. We'll continue antifungals. Could consider repeat chest CT or bronchoscopy if unable to extubate. Appreciate pulmonology following. Did have a thoracentesis on 11/18 with removal of 1.8 L of fluid. Fluid is consistent with exudative effusion  2 Septic shock secondary to pneumonia and also possible line sepsis: See above. I have discussed removing the hemodialysis catheter with nephrology. They are in agreement.  #3 Rhabdomyolysis: Resolved - Possibly due to prolonged immobilization, hypotension, diarrhea, possible withdrawal seizures prior to admission.   #4 acute renal failure: secondary to ATN from one depletion, rhabdomyolysis, C. Difficile. Good urine output. No need for hemodialysis at this  time.  #5 transaminitis: Improved. Albumin very low.  6 .left leg edema;dvt study is negative.  #7 ,anxiety, mental disorder with PTSD: .schizophrenia;consult psych when extubated.   All the records are reviewed and case discussed with Care Management/Social Workerr. Management plans discussed with the patient, family and they are in agreement. I spoke with the patient's father today, was unable to reach his mother. The father understands the purpose of LTAC care and will discuss with the mother. They would prefer to wait until he is off of pressors and possibly off of the ventilator. We'll revisit this conversation on Monday.  CODE STATUS: Full   TOTAL CRITICAL CARE TIME TAKING CARE OF THIS PATIENT: 35 minutes.    POSSIBLE D/C  ? DAYS, DEPENDING ON CLINICAL CONDITION.   Elby ShowersWALSH, Byanka Landrus M.D on 09/05/2015 at 2:31 PM  Between 7am to 6pm - Pager - [phone]  After 6pm go to [email or web address] - password [email or web address] [facility]  [place]  Office  [phone]  CC: Primary care physician; No primary care provider on file.

## 2015-09-05 NOTE — Progress Notes (Signed)
Spoke with Dr Clent RidgesWalsh regarding triglycerides. Orders to recheck.

## 2015-09-06 ENCOUNTER — Inpatient Hospital Stay: Payer: Medicare Other

## 2015-09-06 DIAGNOSIS — J948 Other specified pleural conditions: Secondary | ICD-10-CM

## 2015-09-06 DIAGNOSIS — J690 Pneumonitis due to inhalation of food and vomit: Secondary | ICD-10-CM

## 2015-09-06 LAB — BLOOD GAS, ARTERIAL
ACID-BASE DEFICIT: 3.5 mmol/L — AB (ref 0.0–2.0)
Bicarbonate: 22.7 mEq/L (ref 21.0–28.0)
FIO2: 0.35
MECHANICAL RATE: 20
MECHVT: 500 mL
O2 Saturation: 98.9 %
PEEP: 5 cmH2O
PH ART: 7.32 — AB (ref 7.350–7.450)
Patient temperature: 37
pCO2 arterial: 44 mmHg (ref 32.0–48.0)
pO2, Arterial: 137 mmHg — ABNORMAL HIGH (ref 83.0–108.0)

## 2015-09-06 LAB — GLUCOSE, CAPILLARY
GLUCOSE-CAPILLARY: 76 mg/dL (ref 65–99)
GLUCOSE-CAPILLARY: 77 mg/dL (ref 65–99)
GLUCOSE-CAPILLARY: 80 mg/dL (ref 65–99)
GLUCOSE-CAPILLARY: 90 mg/dL (ref 65–99)
GLUCOSE-CAPILLARY: 93 mg/dL (ref 65–99)
GLUCOSE-CAPILLARY: 93 mg/dL (ref 65–99)
GLUCOSE-CAPILLARY: 94 mg/dL (ref 65–99)

## 2015-09-06 LAB — BASIC METABOLIC PANEL
Anion gap: 6 (ref 5–15)
BUN: 59 mg/dL — ABNORMAL HIGH (ref 6–20)
CALCIUM: 8.9 mg/dL (ref 8.9–10.3)
CO2: 23 mmol/L (ref 22–32)
CREATININE: 4.33 mg/dL — AB (ref 0.61–1.24)
Chloride: 115 mmol/L — ABNORMAL HIGH (ref 101–111)
GFR calc non Af Amer: 15 mL/min — ABNORMAL LOW (ref >60)
GFR, EST AFRICAN AMERICAN: 17 mL/min — AB (ref >60)
Glucose, Bld: 106 mg/dL — ABNORMAL HIGH (ref 65–99)
Potassium: 5.1 mmol/L (ref 3.5–5.1)
SODIUM: 144 mmol/L (ref 135–145)

## 2015-09-06 LAB — CBC
HEMATOCRIT: 28 % — AB (ref 40.0–52.0)
Hemoglobin: 9 g/dL — ABNORMAL LOW (ref 13.0–18.0)
MCH: 28.8 pg (ref 26.0–34.0)
MCHC: 31.9 g/dL — AB (ref 32.0–36.0)
MCV: 90.2 fL (ref 80.0–100.0)
Platelets: 250 10*3/uL (ref 150–440)
RBC: 3.11 MIL/uL — ABNORMAL LOW (ref 4.40–5.90)
RDW: 14.8 % — AB (ref 11.5–14.5)
WBC: 7.7 10*3/uL (ref 3.8–10.6)

## 2015-09-06 MED ORDER — ANTISEPTIC ORAL RINSE SOLUTION (CORINZ)
7.0000 mL | OROMUCOSAL | Status: DC
  Administered 2015-09-06 – 2015-09-08 (×26): 7 mL via OROMUCOSAL
  Filled 2015-09-06 (×33): qty 7

## 2015-09-06 MED ORDER — CHLORHEXIDINE GLUCONATE 0.12% ORAL RINSE (MEDLINE KIT)
15.0000 mL | Freq: Two times a day (BID) | OROMUCOSAL | Status: DC
  Administered 2015-09-06 – 2015-09-13 (×14): 15 mL via OROMUCOSAL
  Filled 2015-09-06 (×20): qty 15

## 2015-09-06 NOTE — Progress Notes (Signed)
[facility] - [place] at [facility]   PATIENT NAME: Ross [name]    MR#:  [id number]  DATE OF BIRTH:  08-29-1966    CHIEF COMPLAINT:   Chief Complaint  Patient presents with  . Seizures   Intubated. Sedated.  REVIEW OF SYSTEMS:   Review of Systems  Unable to perform ROS: intubated  Constitutional: Negative for fever and chills.  HENT: Negative for hearing loss.   Eyes: Negative for blurred vision, double vision and photophobia.  Respiratory: Negative for cough, hemoptysis, sputum production and wheezing.   Cardiovascular: Negative for chest pain, palpitations, orthopnea and leg swelling.  Gastrointestinal: Negative for nausea, vomiting, abdominal pain and diarrhea.  Genitourinary: Negative for dysuria and urgency.  Musculoskeletal: Negative for neck pain.  Skin: Negative for rash.  Neurological: Negative for dizziness, focal weakness, seizures, weakness and headaches.  Psychiatric/Behavioral: Negative for memory loss. The patient does not have insomnia.     Have anxiety, PTSD issues. Received some Haldol for hallucinations. DRUG ALLERGIES:   Allergies  Allergen Reactions  . Antihistamines, Diphenhydramine-Type Anaphylaxis and Rash  . Benadryl [Diphenhydramine Hcl] Anaphylaxis and Rash  . Hydroxyzine Anaphylaxis and Rash    VITALS:  Blood pressure 94/48, pulse 81, temperature 98.1 F (36.7 C), temperature source Axillary, resp. rate 20, height  (1.803 m), weight 107.4 kg (236 lb 12.4 oz), SpO2 98 %.  PHYSICAL EXAMINATION:  GENERAL:  49 y.o.-year-old patient  intubated, sedated, critically ill  HEENT: Pupils equal round and reactive, oral mucous membranes are moist, orally intubated Pulmonary: Coarse mechanical ventilations, good air movement decreased at bases  CARDIOVASCULAR: S1, S2 normal. No murmurs, rubs, or gallops. ABDOMEN: Soft, nontender, nondistended. Bowel sounds present. No organomegaly or mass.  EXTREMITIES: +1 edema  bilaterally edema. No cyanosis, or clubbing. Peripheral pulses 2+ NEUROLOGIC: intubated/sedated. SKIN: No obvious rash, lesion, or ulcer.  Psych;.'intubated/sedated.  LABORATORY PANEL:   CBC  Recent Labs Lab 09/06/15 0600  WBC 7.7  HGB 9.0*  HCT 28.0*  PLT 250   ------------------------------------------------------------------------------------------------------------------  Chemistries   Recent Labs Lab 09/05/15 0507 09/06/15 0600  NA UNABLE TO PERFORM DUE TO LIPEMIC INTERFERENCE 144  K 4.9 5.1  CL 113* 115*  CO2 23 23  GLUCOSE 114* 106*  BUN 61* 59*  CREATININE 4.54* 4.33*  CALCIUM 8.1* 8.9  AST 40  --   ALT 49  --   ALKPHOS 208*  --   BILITOT 0.8  --    ------------------------------------------------------------------------------------------------------------------  Cardiac Enzymes No results for input(s): TROPONINI in the last 168 hours. ------------------------------------------------------------------------------------------------------------------  RADIOLOGY:  Dg Chest 1 View  09/06/2015  CLINICAL DATA:  Shortness of breath for a prior left thoracentesis. EXAM: CHEST 1 VIEW COMPARISON:  Chest radiograph 09/04/2015. FINDINGS: ET tube terminates in the mid trachea. Enteric tube courses inferior to the diaphragm. Multiple monitoring leads overlie the patient. Stable cardiac and mediastinal contours. Minimal atelectasis right base. Large layering left pleural effusion with minimal aeration of the left upper lung, grossly stable when compared to prior. No definite pneumothorax. IMPRESSION: Persistent large layering left pleural effusion with minimal aeration of the left upper lung. Consolidative opacities within the remainder of left lung likely secondary to underlying atelectasis. Stable support apparatus. Electronically Signed   By: Annia Belt M.D.   On: 09/06/2015 09:25    EKG:   Orders placed or performed during the hospital encounter of 08/21/15  . EKG  12-Lead  . EKG 12-Lead    ASSESSMENT AND PLAN:    #1  acute respiratory failure with hypoxia and altered mental status:  Recurrent acute respiratory failure. Currently has pneumonia with pleural effusion. Sputum cultures are growing pansensitive Citrobacter. Blood cultures are negative. Infectious disease following. We will transition from vancomycin and Zosyn to Levaquin at this time. We'll continue antifungals.  Appreciate pulmonology following. Did have a thoracentesis on 11/18 with removal of 1.8 L of fluid. Fluid is consistent with exudative effusion. Effusion has reaccumulated and he will be set up for thoracentesis again tomorrow.  2 Septic shock secondary to pneumonia and also possible line sepsis: See above. I have discussed removing the hemodialysis catheter with nephrology. They are in agreement. We will discontinue this today. Still on low-dose Levaquin for blood pressure support  #3 Rhabdomyolysis: Resolved - Possibly due to prolonged immobilization, hypotension, diarrhea, possible withdrawal seizures prior to admission.   #4 acute renal failure: secondary to ATN from one depletion, rhabdomyolysis, C. Difficile. Good urine output. No need for hemodialysis at this time.  #5 transaminitis: Improved. Albumin very low.  6 .left leg edema;dvt study is negative.  #7 ,anxiety, mental disorder with PTSD: .schizophrenia;consult psych when extubated.   All the records are reviewed and case discussed with Care Management/Social Workerr. Management plans discussed with the patient, family and they are in agreement. I spoke with the patient's father today, was unable to reach his mother. The father understands the purpose of LTAC care and will discuss with the mother. They would prefer to wait until he is off of pressors and possibly off of the ventilator. We'll revisit this conversation on Monday.  CODE STATUS: Full   TOTAL CRITICAL CARE TIME TAKING CARE OF THIS PATIENT: 35 minutes.     POSSIBLE D/C  ? DAYS, DEPENDING ON CLINICAL CONDITION.   Elby ShowersWALSH, Rolanda Campa M.D on 09/06/2015 at 2:03 PM  Between 7am to 6pm - Pager - [phone]  After 6pm go to [email or web address] - password [email or web address] [email or web address]  [place] [place] [facility]  Office  [phone]  CC: Primary care physician; No primary care provider on file.

## 2015-09-06 NOTE — Consult Note (Signed)
[facility] Consultation      Name: Ross [name] MRN: [id number] DOB: Feb 22, 1966    ADMISSION DATE:  08/21/2015   CHIEF COMPLAINT:    acute resp failure   SUBJECTIVE   Remains intubated,sedated fio2 40% Off vasopressors, slight twitching of head noted.   SIGNIFICANT EVENTS   11/4 intubated 11/5 failed vent wean due to encephalopathy and acidosis 11/6 RIJ CVL, R Fem Vein VasCath 11/8 bronchoscopy with BAL 11/8 thoracentesis, 1800 cc removed.  11/9 self extubated 11/15 transferred back to ICU 11/15 intubated  Review of Systems  Unable to perform ROS: critical illness      VITAL SIGNS    Temp:  [97.3 F (36.3 C)-98.7 F (37.1 C)] 97.9 F (36.6 C) (11/20 0400) Pulse Rate:  [73-93] 86 (11/20 0600) Resp:  [16-22] 16 (11/20 0600) BP: (98-121)/(56-81) 112/68 mmHg (11/20 0600) SpO2:  [98 %-100 %] 100 % (11/20 0600) FiO2 (%):  [35 %] 35 % (11/20 0400) Weight:  [107.4 kg (236 lb 12.4 oz)] 107.4 kg (236 lb 12.4 oz) (11/20 0500) HEMODYNAMICS:   VENTILATOR SETTINGS: Vent Mode:  [-] PRVC FiO2 (%):  [35 %] 35 % Set Rate:  [20 bmp] 20 bmp Vt Set:  [500 mL] 500 mL PEEP:  [5 cmH20] 5 cmH20 Plateau Pressure:  [14 cmH20] 14 cmH20 INTAKE / OUTPUT:  Intake/Output Summary (Last 24 hours) at 09/06/15 0801 Last data filed at 09/06/15 0600  Gross per 24 hour  Intake 2472.32 ml  Output   3201 ml  Net -728.68 ml       PHYSICAL EXAM   Physical Exam  Constitutional: No distress.  HENT:  Head: Normocephalic and atraumatic.  Endotracheal tube in place.  Eyes: Pupils are equal, round, and reactive to light. No scleral icterus.  Neck: Normal range of motion. Neck supple.  Cardiovascular: Normal rate and regular rhythm.   No murmur heard. Pulmonary/Chest: No respiratory distress. He has no wheezes. He has no rales.  Extubated, still with coarse BS, but improving  Abdominal: Soft.  Musculoskeletal: He exhibits no edema.  Neurological:  The  patient is currently intubated on the ventilator.  Skin: Skin is warm. He is not diaphoretic.       LABS   LABS:  CBC  Recent Labs Lab 09/03/15 0807 09/05/15 0507 09/06/15 0600  WBC 15.6* 7.4 7.7  HGB 10.0* 8.7* 9.0*  HCT 31.7* 26.2* 28.0*  PLT 199 204 250   Coag's No results for input(s): APTT, INR in the last 168 hours. BMET  Recent Labs Lab 09/04/15 0925 09/05/15 0507 09/06/15 0600  NA 143 UNABLE TO PERFORM DUE TO LIPEMIC INTERFERENCE 144  K 4.8 4.9 5.1  CL 112* 113* 115*  CO2 BUN 66* 61* 59*  CREATININE 4.92* 4.54* 4.33*  GLUCOSE 96 114* 106*   Electrolytes  Recent Labs Lab 09/04/15 0925 09/05/15 0507 09/06/15 0600  CALCIUM 8.1* 8.1* 8.9   Sepsis Markers No results for input(s): LATICACIDVEN, PROCALCITON, O2SATVEN in the last 168 hours. ABG  Recent Labs Lab 09/02/15 1555 09/06/15 0400  PHART 7.29* 7.32*  PCO2ART 46 44  PO2ART 126* 137*   Liver Enzymes  Recent Labs Lab 09/05/15 0507  AST 40  ALT 49  ALKPHOS 208*  BILITOT 0.8  ALBUMIN 1.8*   Cardiac Enzymes No results for input(s): TROPONINI, PROBNP in the last 168 hours. Glucose  Recent Labs Lab 09/05/15 1201 09/05/15 1723 09/05/15 2025 09/06/15 09/06/15 0329 09/06/15 0726  GLUCAP 75 81 81  77 80 93     Recent Results (from the past 240 hour(s))  Culture, blood (routine x 2)     Status: None (Preliminary result)   Collection Time: 09/02/15 12:00 PM  Result Value Ref Range Status   Specimen Description BLOOD RIGHT GROIN  Final   Special Requests   Final    BOTTLES DRAWN AEROBIC AND ANAEROBIC  6CC AERO 10CC ANAERO   Culture NO GROWTH 3 DAYS  Final   Report Status PENDING  Incomplete  Culture, blood (routine x 2)     Status: None (Preliminary result)   Collection Time: 09/02/15 12:15 PM  Result Value Ref Range Status   Specimen Description BLOOD RIGHT GROIN  Final   Special Requests BOTTLES DRAWN AEROBIC AND ANAEROBIC  10 CC  Final   Culture NO GROWTH 3 DAYS   Final   Report Status PENDING  Incomplete  Culture, blood (routine x 2)     Status: None (Preliminary result)   Collection Time: 09/02/15  1:47 PM  Result Value Ref Range Status   Specimen Description BLOOD RIGHT HAND  Final   Special Requests BOTTLES DRAWN AEROBIC AND ANAEROBIC 4CC  Final   Culture NO GROWTH 3 DAYS  Final   Report Status PENDING  Incomplete  Culture, blood (routine x 2)     Status: None (Preliminary result)   Collection Time: 09/02/15  2:06 PM  Result Value Ref Range Status   Specimen Description BLOOD LEFT HAND  Final   Special Requests BOTTLES DRAWN AEROBIC AND ANAEROBIC 1CC  Final   Culture NO GROWTH 3 DAYS  Final   Report Status PENDING  Incomplete  MRSA PCR Screening     Status: None   Collection Time: 09/02/15 10:27 PM  Result Value Ref Range Status   MRSA by PCR NEGATIVE NEGATIVE Final    Comment:        The GeneXpert MRSA Assay (FDA approved for NASAL specimens only), is one component of a comprehensive MRSA colonization surveillance program. It is not intended to diagnose MRSA infection nor to guide or monitor treatment for MRSA infections.   Culture, expectorated sputum-assessment     Status: None   Collection Time: 09/03/15 12:18 AM  Result Value Ref Range Status   Specimen Description SPUTUM  Final   Special Requests Normal  Final   Sputum evaluation THIS SPECIMEN IS ACCEPTABLE FOR SPUTUM CULTURE  Final   Report Status 09/03/2015 FINAL  Final  Culture, respiratory (NON-Expectorated)     Status: None   Collection Time: 09/03/15 12:18 AM  Result Value Ref Range Status   Specimen Description SPUTUM  Final   Special Requests Normal Reflexed from [id number]  Final   Gram Stain   Final    EXCELLENT SPECIMEN - 90-100% WBCS MANY WBC SEEN MANY GRAM NEGATIVE COCCOBACILLI    Culture MODERATE GROWTH CITROBACTER KOSERI  Final   Report Status 09/05/2015 FINAL  Final   Organism ID, Bacteria CITROBACTER KOSERI  Final      Susceptibility   Citrobacter  koseri - MIC*    CEFAZOLIN <=4 SENSITIVE Sensitive     CEFTRIAXONE <=1 SENSITIVE Sensitive     CIPROFLOXACIN <=0.25 SENSITIVE Sensitive     GENTAMICIN <=1 SENSITIVE Sensitive     IMIPENEM <=0.25 SENSITIVE Sensitive     TRIMETH/SULFA <=20 SENSITIVE Sensitive     PIP/TAZO Value in next row Sensitive      SENSITIVE<=4    LEVOFLOXACIN Value in next row Sensitive  SENSITIVE<=0.12    * MODERATE GROWTH CITROBACTER KOSERI  Body fluid culture     Status: None (Preliminary result)   Collection Time: 09/04/15 10:30 AM  Result Value Ref Range Status   Specimen Description CYTO PLEU  Final   Special Requests NONE  Final   Gram Stain PENDING  Incomplete   Culture NO GROWTH < 24 HOURS  Final   Report Status PENDING  Incomplete     Current facility-administered medications:  .  acetaminophen (TYLENOL) tablet 650 mg, 650 mg, Oral, Q6H PRN, 650 mg at 09/02/15 1231 **OR** acetaminophen (TYLENOL) suppository 650 mg, 650 mg, Rectal, Q6H PRN, Vishal Mungal, MD .  anidulafungin (ERAXIS) 100 mg in sodium chloride 0.9 % 100 mL IVPB, 100 mg, Intravenous, Q24H, Clydie Braun, MD, 100 mg at 09/05/15 2055 .  antiseptic oral rinse solution (CORINZ), 7 mL, Mouth Rinse, 10 times per day, Erin Fulling, MD, 7 mL at 09/06/15 0600 .  chlorhexidine gluconate (PERIDEX) 0.12 % solution 15 mL, 15 mL, Mouth Rinse, BID, Erin Fulling, MD, 15 mL at 09/06/15 0100 .  feeding supplement (PRO-STAT SUGAR FREE 64) liquid 30 mL, 30 mL, Oral, 6 times per day, Erin Fulling, MD, 30 mL at 09/06/15 0449 .  feeding supplement (VITAL HIGH PROTEIN) liquid 1,000 mL, 1,000 mL, Per Tube, Q24H, Erin Fulling, MD, 1,000 mL at 09/05/15 1347 .  fentaNYL in NS (52mcg/ml) infusion-PREMIX, 10 mcg/hr, Intravenous, Continuous, Erin Fulling, MD, Last Rate: 25 mL/hr at 09/06/15 0600, 250 mcg/hr at 09/06/15 0600 .  free water 30 mL, 30 mL, Per Tube, Q4H, Erin Fulling, MD, 30 mL at 09/06/15 0530 .  heparin injection 5,000 Units, 5,000 Units,  Subcutaneous, 3 times per day, Katha Hamming, MD, 5,000 Units at 09/06/15 0600 .  hydrALAZINE (APRESOLINE) injection 10 mg, 10 mg, Intravenous, Q4H PRN, Oralia Manis, MD .  Influenza vac split quadrivalent PF (FLUARIX) injection 0.5 mL, 0.5 mL, Intramuscular, Prior to discharge, Katha Hamming, MD .  insulin aspart (novoLOG) injection 0-5 Units, 0-5 Units, Subcutaneous, QHS, Gale Journey, MD, 1 Units at 08/29/15 1834 .  insulin aspart (novoLOG) injection 0-9 Units, 0-9 Units, Subcutaneous, TID WC, Gale Journey, MD, 1 Units at 09/03/15 [phone] .  ipratropium-albuterol (DUONEB) 0.5-2.5 (3) MG/3ML nebulizer solution 3 mL, 3 mL, Nebulization, Q4H, Erin Fulling, MD, 3 mL at 09/06/15 0716 .  levofloxacin (LEVAQUIN) IVPB 750 mg, 750 mg, Intravenous, Q48H, Foye Deer, RPH, 750 mg at 09/05/15 1724 .  norepinephrine (LEVOPHED)  in D5W premix infusion, 0-40 mcg/min, Intravenous, Titrated, Erin Fulling, MD, Stopped at 09/05/15 1347 .  ondansetron (ZOFRAN) injection 4 mg, 4 mg, Intravenous, Q8H PRN, Vishal Mungal, MD, 4 mg at 08/28/15 1052 .  oxyCODONE (Oxy IR/ROXICODONE) immediate release tablet 5 mg, 5 mg, Oral, Q6H PRN, Oralia Manis, MD, 5 mg at 09/01/15 2125 .  pantoprazole (PROTONIX) injection 40 mg, 40 mg, Intravenous, Q2000, Erin Fulling, MD, 40 mg at 09/05/15 2056 .  pneumococcal 23 valent vaccine (PNU-IMMUNE) injection 0.5 mL, 0.5 mL, Intramuscular, Prior to discharge, Katha Hamming, MD .  propofol (DIPRIVAN) 1000 MG/100ML infusion, 5-80 mcg/kg/min, Intravenous, Titrated, Erin Fulling, MD, Last Rate: 35.4 mL/hr at 09/06/15 0600, 50 mcg/kg/min at 09/06/15 0600 .  traZODone (DESYREL) tablet 25 mg, 25 mg, Oral, QHS PRN, Oralia Manis, MD, 25 mg at 09/02/15 0047  IMAGING        Indwelling Urinary Catheter continued, requirement due to   Reason to continue Indwelling Urinary Catheter for strict Intake/Output monitoring for  hemodynamic instability         Ventilator  continued, requirement due to, resp failure    Ventilator Sedation RASS 0 to -2   MAJOR EVENTS/TEST RESULTS: 11/4 intubated 11/5 failed vent wean 11/8 bronchoscopy 11/9 self extubated 11/16 intubated 11/16 CXR c/w Left sided opacity likely pnuemonia  INDWELLING DEVICES:: 11/6 RIJ CVL, R Fem Vein VasCath>>  MICRO DATA: MRSA PCR >>neg Urine 11/4>>negative Blood x 2 11/4>>No growth to date Resp BAL 11/8>>neg FLU H1N1>> NEG c diff + (antigend and toxin) 11/5  ANTIMICROBIALS:  Oral vanc>>11/5 Flagy>>11/5 Vancomycin/Zosyn 11/16    ASSESSMENT/PLAN   49 yo AAM with acute resp failure with acute encephalopathy from acute sepsis from acute recurrent c diff colitis With acute renal failure from rhabdomyolysis with acute drug abuse with etoh abuse  Transferred back to ICU for acute resp failure from sepsis and pneumonia left sided  PULMONARY- 1.Respiratory Failure -continue Full MV support -continue Bronchodilator Therapy -Wean Fio2 and PEEP as tolerated Follow up ABg and CXR as needed -s/p US guided thoracentesis for effusion, consistent with exudative effusion,  -The left sided pleural effusion. It appears to have reaccumulated, or nocturia completely drained. We will resend for repeat thoracentesis for drainage of this likely parapneumonic pleural effusion. -Citrobacter from aspirated sputum, continue antibiotics.  CARDIOVASCULAR -blood pressure stable Vasopressors if needed-keep MAP>65   RENAL ATN from Rhabdomyolysis - ck's trending down Acute renal failure from ATN -currently on intermittent HD -appreciate nephro input  GASTROINTESTINAL GI prophylaxis  HEMATOLOGIC Follow CBC  INFECTIOUS + cdiff and HCAP - vancomycin and flagyl -pneumonia-on zosyn and vancomycin -obtain blood cultures and sputum culture-   ENDOCRINE - ICU hypoglycemic\Hyperglycemia protocol   NEUROLOGIC - intubated and sedated - minimal sedation to achieve a RASS goal:  -1   Critical Care Attestation.  I have personally obtained a history, examined the patient, evaluated laboratory and imaging results, formulated the assessment and plan and placed orders. The Patient requires high complexity decision making for assessment and support, frequent evaluation and titration of therapies, application of advanced monitoring technologies and extensive interpretation of multiple databases. The patient has critical illness that could lead imminently to failure of 1 or more organ systems and requires the highest level of physician preparedness to intervene.  Critical Care Time devoted to patient care services described in this note is 35 minutes and is exclusive of time spent in procedures.      Wells Guiles, M.D.

## 2015-09-06 NOTE — Progress Notes (Signed)
Pt remains intubated with FiO2 at 35% O2 sats 100%; pt follow commands per Dr. Ardyth Manam pt rested on ventilator during shift no attempts to wean vent today thoracentesis in the am; vss; adequate uop via foley; pt had multiple loose stools during shift rectal tube placed; sinus rhythm on cardiac monitor; dialysis catheter removed per Dr Claris CheWalsh's verbal orders dressing dry and intact will continue to monitor and assess pt

## 2015-09-06 NOTE — Progress Notes (Signed)
ANTIBIOTIC CONSULT NOTE - follow up  Pharmacy Consult for Levaquin Indication: HCAP  Allergies  Allergen Reactions  . Antihistamines, Diphenhydramine-Type Anaphylaxis and Rash  . Benadryl [Diphenhydramine Hcl] Anaphylaxis and Rash  . Hydroxyzine Anaphylaxis and Rash    Patient Measurements: Height: 5\' 11"  (180.3 cm) Weight: 236 lb 12.4 oz (107.4 kg) IBW/kg (Calculated) : 75.3 Adjusted Body Weight: na  Vital Signs: Temp: 98.1 F (36.7 C) (11/20 1200) Temp Source: Axillary (11/20 1200) BP: 98/54 mmHg (11/20 1500) Pulse Rate: 77 (11/20 1500) Intake/Output from previous day: 11/19 0701 - 11/20 0700 In: 2472.3 [I.V.:1302.3; NG/GT:760; IV Piggyback:410] Out: 3201 [Urine:3200; Stool:1] Intake/Output from this shift: Total I/O In: 125 [Other:125] Out: 650 [Urine:650]  Labs:  Recent Labs  09/04/15 0925 09/05/15 0507 09/06/15 0600  WBC  --  7.4 7.7  HGB  --  8.7* 9.0*  PLT  --  204 250  CREATININE 4.92* 4.54* 4.33*   Estimated Creatinine Clearance: 26.3 mL/min (by C-G formula based on Cr of 4.33).   Microbiology: Recent Results (from the past 720 hour(s))  Blood Culture (routine x 2)     Status: None   Collection Time: 08/21/15 12:47 PM  Result Value Ref Range Status   Specimen Description BLOOD UNKNOWN  Final   Special Requests BOTTLES DRAWN AEROBIC AND ANAEROBIC  1CC  Final   Culture NO GROWTH 10 DAYS  Final   Report Status 08/31/2015 FINAL  Final  Blood Culture (routine x 2)     Status: None   Collection Time: 08/21/15 12:47 PM  Result Value Ref Range Status   Specimen Description BLOOD LEFT AC  Final   Special Requests BOTTLES DRAWN AEROBIC AND ANAEROBIC  6CC  Final   Culture NO GROWTH 10 DAYS  Final   Report Status 08/31/2015 FINAL  Final  Urine culture     Status: None   Collection Time: 08/21/15 12:47 PM  Result Value Ref Range Status   Specimen Description URINE, CATHETERIZED  Final   Special Requests Normal  Final   Culture NO GROWTH 2 DAYS  Final    Report Status 08/23/2015 FINAL  Final  MRSA PCR Screening     Status: None   Collection Time: 08/21/15 11:47 PM  Result Value Ref Range Status   MRSA by PCR NEGATIVE NEGATIVE Final    Comment:        The GeneXpert MRSA Assay (FDA approved for NASAL specimens only), is one component of a comprehensive MRSA colonization surveillance program. It is not intended to diagnose MRSA infection nor to guide or monitor treatment for MRSA infections.   C difficile quick scan w PCR reflex     Status: Abnormal   Collection Time: 08/22/15  2:31 PM  Result Value Ref Range Status   C Diff antigen POSITIVE (A) NEGATIVE Final   C Diff toxin POSITIVE (A) NEGATIVE Final   C Diff interpretation   Final    Positive for toxigenic C. difficile, active toxin production present.    Comment: CRITICAL RESULT CALLED TO, READ BACK BY AND VERIFIED WITH: CHARLIE FLEETWOOD,RN 08/22/2015 1544 BY J RAZZAKSUAREZ,MT   Culture, bal-quantitative     Status: None   Collection Time: 08/25/15  3:15 PM  Result Value Ref Range Status   Specimen Description BRONCHIAL ALVEOLAR LAVAGE  Final   Special Requests NONE  Final   Gram Stain   Final    GOOD SPECIMEN - 80-90% WBCS FEW WBC SEEN NO ORGANISMS SEEN    Culture HOLDING FOR POSSIBLE  PATHOGEN  Final   Report Status 08/27/2015 FINAL  Final  Culture, blood (routine x 2)     Status: None (Preliminary result)   Collection Time: 09/02/15 12:00 PM  Result Value Ref Range Status   Specimen Description BLOOD RIGHT GROIN  Final   Special Requests   Final    BOTTLES DRAWN AEROBIC AND ANAEROBIC  6CC AERO 10CC ANAERO   Culture NO GROWTH 4 DAYS  Final   Report Status PENDING  Incomplete  Culture, blood (routine x 2)     Status: None (Preliminary result)   Collection Time: 09/02/15 12:15 PM  Result Value Ref Range Status   Specimen Description BLOOD RIGHT GROIN  Final   Special Requests BOTTLES DRAWN AEROBIC AND ANAEROBIC  10 CC  Final   Culture NO GROWTH 4 DAYS  Final    Report Status PENDING  Incomplete  Culture, blood (routine x 2)     Status: None (Preliminary result)   Collection Time: 09/02/15  1:47 PM  Result Value Ref Range Status   Specimen Description BLOOD RIGHT HAND  Final   Special Requests BOTTLES DRAWN AEROBIC AND ANAEROBIC 4CC  Final   Culture NO GROWTH 4 DAYS  Final   Report Status PENDING  Incomplete  Culture, blood (routine x 2)     Status: None (Preliminary result)   Collection Time: 09/02/15  2:06 PM  Result Value Ref Range Status   Specimen Description BLOOD LEFT HAND  Final   Special Requests BOTTLES DRAWN AEROBIC AND ANAEROBIC 1CC  Final   Culture NO GROWTH 4 DAYS  Final   Report Status PENDING  Incomplete  MRSA PCR Screening     Status: None   Collection Time: 09/02/15 10:27 PM  Result Value Ref Range Status   MRSA by PCR NEGATIVE NEGATIVE Final    Comment:        The GeneXpert MRSA Assay (FDA approved for NASAL specimens only), is one component of a comprehensive MRSA colonization surveillance program. It is not intended to diagnose MRSA infection nor to guide or monitor treatment for MRSA infections.   Culture, expectorated sputum-assessment     Status: None   Collection Time: 09/03/15 12:18 AM  Result Value Ref Range Status   Specimen Description SPUTUM  Final   Special Requests Normal  Final   Sputum evaluation THIS SPECIMEN IS ACCEPTABLE FOR SPUTUM CULTURE  Final   Report Status 09/03/2015 FINAL  Final  Culture, respiratory (NON-Expectorated)     Status: None   Collection Time: 09/03/15 12:18 AM  Result Value Ref Range Status   Specimen Description SPUTUM  Final   Special Requests Normal Reflexed from [id number]  Final   Gram Stain   Final    EXCELLENT SPECIMEN - 90-100% WBCS MANY WBC SEEN MANY GRAM NEGATIVE COCCOBACILLI    Culture MODERATE GROWTH CITROBACTER KOSERI  Final   Report Status 09/05/2015 FINAL  Final   Organism ID, Bacteria CITROBACTER KOSERI  Final      Susceptibility   Citrobacter koseri - MIC*     CEFAZOLIN <=4 SENSITIVE Sensitive     CEFTRIAXONE <=1 SENSITIVE Sensitive     CIPROFLOXACIN <=0.25 SENSITIVE Sensitive     GENTAMICIN <=1 SENSITIVE Sensitive     IMIPENEM <=0.25 SENSITIVE Sensitive     TRIMETH/SULFA <=20 SENSITIVE Sensitive     PIP/TAZO Value in next row Sensitive      SENSITIVE<=4    LEVOFLOXACIN Value in next row Sensitive      SENSITIVE<=0.12    *  MODERATE GROWTH CITROBACTER KOSERI  Body fluid culture     Status: None (Preliminary result)   Collection Time: 09/04/15 10:30 AM  Result Value Ref Range Status   Specimen Description CYTO PLEU  Final   Special Requests NONE  Final   Gram Stain   Final    MODERATE WBC SEEN TOO NUMEROUS TO COUNT RED BLOOD CELLS NO ORGANISMS SEEN    Culture NO GROWTH 2 DAYS  Final   Report Status PENDING  Incomplete    Medical History: Past Medical History  Diagnosis Date  . Sleep apnea     had surgery to correct it  . Mental disorder     PTSD  . Seizures (HCC)     currently dx with seizures  . Cardiac arrest (HCC) 08/23/2011  . Pneumonia 09/04/2011  . Pancreatitis   . SOB (shortness of breath) 09/11/2011  . Hypertension   . Anxiety   . Myocardial infarction (HCC)   . Schizophrenia (HCC)   . Rhabdomyolysis 08/23/2015  . Acute renal failure (HCC) 08/23/2015    Medications:  Anti-infectives    Start     Dose/Rate Route Frequency Ordered Stop   09/05/15 1800  levofloxacin (LEVAQUIN) IVPB 750 mg     750 mg 100 mL/hr over 90 Minutes Intravenous Every 48 hours 09/05/15 1544     09/04/15 1200  vancomycin (VANCOCIN) IVPB 1000 mg/200 mL premix  Status:  Discontinued     1,000 mg 200 mL/hr over 60 Minutes Intravenous Every M-W-F (Hemodialysis) 09/02/15 1418 09/04/15 1059   09/03/15 2000  anidulafungin (ERAXIS) 100 mg in sodium chloride 0.9 % 100 mL IVPB     100 mg over 90 Minutes Intravenous Every 24 hours 09/02/15 1620     09/02/15 2000  anidulafungin (ERAXIS) 200 mg in sodium chloride 0.9 % 200 mL IVPB     200 mg over  180 Minutes Intravenous  Once 09/02/15 1812 09/02/15 2307   09/02/15 1630  anidulafungin (ERAXIS) 200 mg in sodium chloride 0.9 % 200 mL IVPB  Status:  Discontinued     200 mg over 180 Minutes Intravenous  Once 09/02/15 1620 09/02/15 2132   09/02/15 1600  piperacillin-tazobactam (ZOSYN) IVPB 3.375 g  Status:  Discontinued     3.375 g 12.5 mL/hr over 240 Minutes Intravenous Every 12 hours 09/02/15 1053 09/02/15 1307   09/02/15 1430  vancomycin (VANCOCIN) 2,000 mg in sodium chloride 0.9 % 500 mL IVPB     2,000 mg 250 mL/hr over 120 Minutes Intravenous  Once 09/02/15 1416 09/02/15 1820   09/02/15 1315  piperacillin-tazobactam (ZOSYN) IVPB 3.375 g  Status:  Discontinued     3.375 g 12.5 mL/hr over 240 Minutes Intravenous Every 12 hours 09/02/15 1307 09/05/15 1447   08/29/15 1700  vancomycin (VANCOCIN) IVPB 1000 mg/200 mL premix  Status:  Discontinued     1,000 mg 200 mL/hr over 60 Minutes Intravenous every 72 hours 08/26/15 1555 08/27/15 1023   08/27/15 1000  meropenem (MERREM) 1 g in sodium chloride 0.9 % 100 mL IVPB  Status:  Discontinued     1 g 200 mL/hr over 30 Minutes Intravenous Every 24 hours 08/26/15 1555 08/27/15 1023   08/26/15 1800  vancomycin (VANCOCIN) 500 mg in sodium chloride irrigation 0.9 % 100 mL ENEMA  Status:  Discontinued     500 mg Rectal 4 times per day 08/26/15 1531 08/27/15 1023   08/26/15 1700  vancomycin (VANCOCIN) IVPB 1000 mg/200 mL premix     1,000 mg 200  mL/hr over 60 Minutes Intravenous  Once 08/26/15 1555 08/26/15 1723   08/25/15 1600  meropenem (MERREM) 1 g in sodium chloride 0.9 % 100 mL IVPB  Status:  Discontinued     1 g 200 mL/hr over 30 Minutes Intravenous Every 12 hours 08/25/15 1545 08/26/15 1555   08/25/15 1600  vancomycin (VANCOCIN) IVPB 1000 mg/200 mL premix  Status:  Discontinued     1,000 mg 200 mL/hr over 60 Minutes Intravenous Every 24 hours 08/25/15 1545 08/26/15 1555   08/24/15 1200  vancomycin (VANCOCIN) 50 mg/mL oral solution 125 mg   Status:  Discontinued     125 mg Oral 4 times per day 08/24/15 1035 09/05/15 1447   08/22/15 2000  vancomycin (VANCOCIN) IVPB 1000 mg/200 mL premix  Status:  Discontinued     1,000 mg 200 mL/hr over 60 Minutes Intravenous Every 24 hours 08/21/15 1439 08/22/15 0956   08/22/15 1800  vancomycin (VANCOCIN) 50 mg/mL oral solution 500 mg  Status:  Discontinued     500 mg Oral 4 times per day 08/22/15 1547 08/24/15 1035   08/22/15 1600  vancomycin (VANCOCIN) IVPB 1000 mg/200 mL premix  Status:  Discontinued     1,000 mg 200 mL/hr over 60 Minutes Intravenous Every 18 hours 08/22/15 0956 08/22/15 1001   08/22/15 1600  vancomycin (VANCOCIN) 1,250 mg in sodium chloride 0.9 % 250 mL IVPB  Status:  Discontinued     1,250 mg 166.7 mL/hr over 90 Minutes Intravenous Every 18 hours 08/22/15 1001 08/23/15 0827   08/22/15 1200  vancomycin (VANCOCIN) 50 mg/mL oral solution 125 mg  Status:  Discontinued     125 mg Oral 4 times per day 08/22/15 1035 08/22/15 1550   08/21/15 2200  piperacillin-tazobactam (ZOSYN) IVPB 3.375 g  Status:  Discontinued     3.375 g 12.5 mL/hr over 240 Minutes Intravenous 3 times per day 08/21/15 1431 08/23/15 0827   08/21/15 2000  vancomycin (VANCOCIN) IVPB 1000 mg/200 mL premix     1,000 mg 200 mL/hr over 60 Minutes Intravenous  Once 08/21/15 1439 08/21/15 2248   08/21/15 1300  piperacillin-tazobactam (ZOSYN) IVPB 3.375 g     3.375 g 100 mL/hr over 30 Minutes Intravenous  Once 08/21/15 1245 08/21/15 1457   08/21/15 1300  vancomycin (VANCOCIN) IVPB 1000 mg/200 mL premix     1,000 mg 200 mL/hr over 60 Minutes Intravenous  Once 08/21/15 1245 08/21/15 1457     Assessment: Patient with pan sensitive citrobacter in sputum. MD changing antibiotics from Vancomycin and Zosyn to Levaquin.  Goal of Therapy:  Resolution of symptoms  Plan:  Will order Levaquin  IV q48h for reduced renal function.  Bari Mantis PharmD Clinical Pharmacist 09/06/2015 4:35 PM

## 2015-09-06 NOTE — Progress Notes (Signed)
Subjective:  Pt remains critically ill. Still on the vent.  Good UOP noted however. Cr slightly lower.    Objective:  Vital signs in last 24 hours:  Temp:  [97.3 F (36.3 C)-98.7 F (37.1 C)] 98.6 F (37 C) (11/20 0826) Pulse Rate:  [73-93] 83 (11/20 1000) Resp:  [16-22] 20 (11/20 1000) BP: (98-121)/(56-81) 101/57 mmHg (11/20 1000) SpO2:  [98 %-100 %] 100 % (11/20 1000) FiO2 (%):  [35 %] 35 % (11/20 0826) Weight:  [107.4 kg (236 lb 12.4 oz)] 107.4 kg (236 lb 12.4 oz) (11/20 0500)  Weight change: -3.2 kg (-7 lb 0.9 oz) Filed Weights   09/04/15 0400 09/05/15 0400 09/06/15 0500  Weight: 114.5 kg (252 lb 6.8 oz) 110.6 kg (243 lb 13.3 oz) 107.4 kg (236 lb 12.4 oz)    Intake/Output:    Intake/Output Summary (Last 24 hours) at 09/06/15 1026 Last data filed at 09/06/15 0851  Gross per 24 hour  Intake 2472.32 ml  Output   3476 ml  Net -1003.68 ml     Physical Exam: General:  critically ill appearing  HEENT  [place]/AT ETT in place  Neck  supple  Pulm/lungs  bilateral rhonchi, vent assisted  CVS/Heart  S1S2 no rubs  Abdomen:   soft, +bowel sounds, nontender  Extremities:  2+ b/l LE edema  Neurologic:  on the vent, sedated.  Skin:  no acute rashes  Access: Right femoral non tunneled catheter Dr. Belia Heman 11/6       Basic Metabolic Panel:   Recent Labs Lab 09/02/15 0507 09/03/15 0807 09/04/15 0925 09/05/15 0507 09/06/15 0600  NA 146* 144 143 UNABLE TO PERFORM DUE TO LIPEMIC INTERFERENCE 144  K 4.2 5.1 4.8 4.9 5.1  CL 118* 112* 112* 113* 115*  CO2 17* GLUCOSE 100* 127* 96 114* 106*  BUN 77* 68* 66* 61* 59*  CREATININE 4.91* 4.87* 4.92* 4.54* 4.33*  CALCIUM 8.6* 8.1* 8.1* 8.1* 8.9     CBC:  Recent Labs Lab 09/01/15 2131 09/03/15 0807 09/05/15 0507 09/06/15 0600  WBC 18.5* 15.6* 7.4 7.7  HGB 11.2* 10.0* 8.7* 9.0*  HCT 34.2* 31.7* 26.2* 28.0*  MCV 87.8 90.9 91.5 90.2  PLT 173 199 204 250      Microbiology:  Recent Results (from the  past 720 hour(s))  Blood Culture (routine x 2)     Status: None   Collection Time: 08/21/15 12:47 PM  Result Value Ref Range Status   Specimen Description BLOOD UNKNOWN  Final   Special Requests BOTTLES DRAWN AEROBIC AND ANAEROBIC  1CC  Final   Culture NO GROWTH 10 DAYS  Final   Report Status 08/31/2015 FINAL  Final  Blood Culture (routine x 2)     Status: None   Collection Time: 08/21/15 12:47 PM  Result Value Ref Range Status   Specimen Description BLOOD LEFT AC  Final   Special Requests BOTTLES DRAWN AEROBIC AND ANAEROBIC  6CC  Final   Culture NO GROWTH 10 DAYS  Final   Report Status 08/31/2015 FINAL  Final  Urine culture     Status: None   Collection Time: 08/21/15 12:47 PM  Result Value Ref Range Status   Specimen Description URINE, CATHETERIZED  Final   Special Requests Normal  Final   Culture NO GROWTH 2 DAYS  Final   Report Status 08/23/2015 FINAL  Final  MRSA PCR Screening     Status: None   Collection Time: 08/21/15 11:47 PM  Result Value Ref  Range Status   MRSA by PCR NEGATIVE NEGATIVE Final    Comment:        The GeneXpert MRSA Assay (FDA approved for NASAL specimens only), is one component of a comprehensive MRSA colonization surveillance program. It is not intended to diagnose MRSA infection nor to guide or monitor treatment for MRSA infections.   C difficile quick scan w PCR reflex     Status: Abnormal   Collection Time: 08/22/15  2:31 PM  Result Value Ref Range Status   C Diff antigen POSITIVE (A) NEGATIVE Final   C Diff toxin POSITIVE (A) NEGATIVE Final   C Diff interpretation   Final    Positive for toxigenic C. difficile, active toxin production present.    Comment: CRITICAL RESULT CALLED TO, READ BACK BY AND VERIFIED WITH: CHARLIE FLEETWOOD,RN 08/22/2015 1544 BY J RAZZAKSUAREZ,MT   Culture, bal-quantitative     Status: None   Collection Time: 08/25/15  3:15 PM  Result Value Ref Range Status   Specimen Description BRONCHIAL ALVEOLAR LAVAGE  Final    Special Requests NONE  Final   Gram Stain   Final    GOOD SPECIMEN - 80-90% WBCS FEW WBC SEEN NO ORGANISMS SEEN    Culture HOLDING FOR POSSIBLE PATHOGEN  Final   Report Status 08/27/2015 FINAL  Final  Culture, blood (routine x 2)     Status: None (Preliminary result)   Collection Time: 09/02/15 12:00 PM  Result Value Ref Range Status   Specimen Description BLOOD RIGHT GROIN  Final   Special Requests   Final    BOTTLES DRAWN AEROBIC AND ANAEROBIC  6CC AERO 10CC ANAERO   Culture NO GROWTH 4 DAYS  Final   Report Status PENDING  Incomplete  Culture, blood (routine x 2)     Status: None (Preliminary result)   Collection Time: 09/02/15 12:15 PM  Result Value Ref Range Status   Specimen Description BLOOD RIGHT GROIN  Final   Special Requests BOTTLES DRAWN AEROBIC AND ANAEROBIC  10 CC  Final   Culture NO GROWTH 4 DAYS  Final   Report Status PENDING  Incomplete  Culture, blood (routine x 2)     Status: None (Preliminary result)   Collection Time: 09/02/15  1:47 PM  Result Value Ref Range Status   Specimen Description BLOOD RIGHT HAND  Final   Special Requests BOTTLES DRAWN AEROBIC AND ANAEROBIC 4CC  Final   Culture NO GROWTH 4 DAYS  Final   Report Status PENDING  Incomplete  Culture, blood (routine x 2)     Status: None (Preliminary result)   Collection Time: 09/02/15  2:06 PM  Result Value Ref Range Status   Specimen Description BLOOD LEFT HAND  Final   Special Requests BOTTLES DRAWN AEROBIC AND ANAEROBIC 1CC  Final   Culture NO GROWTH 4 DAYS  Final   Report Status PENDING  Incomplete  MRSA PCR Screening     Status: None   Collection Time: 09/02/15 10:27 PM  Result Value Ref Range Status   MRSA by PCR NEGATIVE NEGATIVE Final    Comment:        The GeneXpert MRSA Assay (FDA approved for NASAL specimens only), is one component of a comprehensive MRSA colonization surveillance program. It is not intended to diagnose MRSA infection nor to guide or monitor treatment for MRSA  infections.   Culture, expectorated sputum-assessment     Status: None   Collection Time: 09/03/15 12:18 AM  Result Value Ref Range Status  Specimen Description SPUTUM  Final   Special Requests Normal  Final   Sputum evaluation THIS SPECIMEN IS ACCEPTABLE FOR SPUTUM CULTURE  Final   Report Status 09/03/2015 FINAL  Final  Culture, respiratory (NON-Expectorated)     Status: None   Collection Time: 09/03/15 12:18 AM  Result Value Ref Range Status   Specimen Description SPUTUM  Final   Special Requests Normal Reflexed from [id number]  Final   Gram Stain   Final    EXCELLENT SPECIMEN - 90-100% WBCS MANY WBC SEEN MANY GRAM NEGATIVE COCCOBACILLI    Culture MODERATE GROWTH CITROBACTER KOSERI  Final   Report Status 09/05/2015 FINAL  Final   Organism ID, Bacteria CITROBACTER KOSERI  Final      Susceptibility   Citrobacter koseri - MIC*    CEFAZOLIN <=4 SENSITIVE Sensitive     CEFTRIAXONE <=1 SENSITIVE Sensitive     CIPROFLOXACIN <=0.25 SENSITIVE Sensitive     GENTAMICIN <=1 SENSITIVE Sensitive     IMIPENEM <=0.25 SENSITIVE Sensitive     TRIMETH/SULFA <=20 SENSITIVE Sensitive     PIP/TAZO Value in next row Sensitive      SENSITIVE<=4    LEVOFLOXACIN Value in next row Sensitive      SENSITIVE<=0.12    * MODERATE GROWTH CITROBACTER KOSERI  Body fluid culture     Status: None (Preliminary result)   Collection Time: 09/04/15 10:30 AM  Result Value Ref Range Status   Specimen Description CYTO PLEU  Final   Special Requests NONE  Final   Gram Stain PENDING  Incomplete   Culture NO GROWTH < 24 HOURS  Final   Report Status PENDING  Incomplete    Coagulation Studies: No results for input(s): LABPROT, INR in the last 72 hours.  Urinalysis: No results for input(s): COLORURINE, LABSPEC, PHURINE, GLUCOSEU, HGBUR, BILIRUBINUR, KETONESUR, PROTEINUR, UROBILINOGEN, NITRITE, LEUKOCYTESUR in the last 72 hours.  Invalid input(s): APPERANCEUR    Imaging: Dg Chest 1 View  09/06/2015  CLINICAL  DATA:  Shortness of breath for a prior left thoracentesis. EXAM: CHEST 1 VIEW COMPARISON:  Chest radiograph 09/04/2015. FINDINGS: ET tube terminates in the mid trachea. Enteric tube courses inferior to the diaphragm. Multiple monitoring leads overlie the patient. Stable cardiac and mediastinal contours. Minimal atelectasis right base. Large layering left pleural effusion with minimal aeration of the left upper lung, grossly stable when compared to prior. No definite pneumothorax. IMPRESSION: Persistent large layering left pleural effusion with minimal aeration of the left upper lung. Consolidative opacities within the remainder of left lung likely secondary to underlying atelectasis. Stable support apparatus. Electronically Signed   By: Annia Belt M.D.   On: 09/06/2015 09:25   Dg Chest 1 View  09/04/2015  CLINICAL DATA:  Status post left thoracentesis for 1.8 L EXAM: CHEST  1 VIEW COMPARISON:  11/16/6 FINDINGS: Persisting consolidation is noted in the left lower lobe. A moderate pleural effusion is noted but decreased from the prior exam. The residual fluid is likely related to some fibrin cross linking precluding complete drainage. No pneumothorax is noted. The endotracheal tube, nasogastric catheter and left-sided PICC line are in stable position. The right jugular central line is been removed in the interval. The right lung remains clear. IMPRESSION: Reduction in pleural effusion on the left. No pneumothorax is noted. Electronically Signed   By: Alcide Clever M.D.   On: 09/04/2015 11:22   US Thoracentesis Asp Pleural Space W/img Guide  09/04/2015  CLINICAL DATA:  Left pleural effusion EXAM: ULTRASOUND GUIDED left THORACENTESIS  PROCEDURE: An ultrasound guided thoracentesis was thoroughly discussed with the patient and questions answered. The benefits, risks, alternatives and complications were also discussed. The patient understands and wishes to proceed with the procedure. Written consent was obtained.  Ultrasound was performed to localize and mark an adequate pocket of fluid in the left chest. The area was then prepped and draped in the normal sterile fashion. 1% Lidocaine was used for local anesthesia. Under ultrasound guidance a 6 French thoracentesis catheter was introduced. Thoracentesis was performed. The catheter was removed and a dressing applied. COMPLICATIONS: None FINDINGS: A total of approximately 1800 mL of bloody fluid was removed. A fluid sample was sent for laboratory analysis. IMPRESSION: Successful ultrasound guided left thoracentesis yielding 1.8 L of pleural fluid. Electronically Signed   By: Alcide Clever M.D.   On: 09/04/2015 11:21     Medications:   . fentaNYL infusion INTRAVENOUS 250 mcg/hr (09/06/15 1000)  . norepinephrine Stopped (09/05/15 1347)  . propofol (DIPRIVAN) infusion 50 mcg/kg/min (09/06/15 1000)   . anidulafungin  100 mg Intravenous Q24H  . antiseptic oral rinse  7 mL Mouth Rinse 10 times per day  . chlorhexidine gluconate  15 mL Mouth Rinse BID  . feeding supplement (PRO-STAT SUGAR FREE 64)  30 mL Oral 6 times per day  . feeding supplement (VITAL HIGH PROTEIN)  1,000 mL Per Tube Q24H  . free water  30 mL Per Tube Q4H  . heparin subcutaneous  5,000 Units Subcutaneous 3 times per day  . insulin aspart  0-5 Units Subcutaneous QHS  . insulin aspart  0-9 Units Subcutaneous TID WC  . ipratropium-albuterol  3 mL Nebulization Q4H  . levofloxacin (LEVAQUIN) IV  750 mg Intravenous Q48H  . pantoprazole (PROTONIX) IV  40 mg Intravenous Q2000   acetaminophen **OR** acetaminophen, hydrALAZINE, Influenza vac split quadrivalent PF, ondansetron (ZOFRAN) IV, oxyCODONE, pneumococcal 23 valent vaccine, traZODone  Assessment/ Plan:  49 y.o. male With alcohol abuse, schizophrenia, seizure disorder, coronary disease/MI 2012 was admitted to [facility] on November 4 after being found unresponsive in his apartment. Urine toxicology screen is positive for benzodiazepines and  cannabinoids. Stool positive for C. Difficile  1. Acute renal failure, likely secondary to ATN from concurrent illness, volume depletion, C. difficile, rhabdomyolysis and sepsis. Has had 4 HD treatments. -Good UOP noted over the past 24 hours, Cr slowly trending down.  No acute indication for HD at this point in time.  Consider removing dialysis catheter early next week..  2. Acute rhabdomyolysis M62.82: Contributed to acute renal failure, resolved with conservative management.  3.  Hypernatremia. Na 144 today, continue to monitor serum na.  4.  Hypokalemia. Resolved.  5. Infective Colitis A09: c. Diff positive.  - treated with PO vancomycin previously.  6.  Acute respiratory failure due to bacterial pneumonia/Left pleural effusion s/p thoracentesis 11/18. -Remains on the vent at this point in time, currently on levofloxacin.   LOS: 16 Lazaria Schaben 11/20/201610:26 AM

## 2015-09-07 ENCOUNTER — Inpatient Hospital Stay: Payer: Medicare Other

## 2015-09-07 DIAGNOSIS — J90 Pleural effusion, not elsewhere classified: Secondary | ICD-10-CM

## 2015-09-07 LAB — BODY FLUID CELL COUNT WITH DIFFERENTIAL
Eos, Fluid: 1 %
Lymphs, Fluid: 26 %
Monocyte-Macrophage-Serous Fluid: 6 %
Neutrophil Count, Fluid: 67 %
OTHER CELLS FL: 0 %
Total Nucleated Cell Count, Fluid: 402 cu mm

## 2015-09-07 LAB — CBC
HCT: 27.3 % — ABNORMAL LOW (ref 40.0–52.0)
HEMOGLOBIN: 8.7 g/dL — AB (ref 13.0–18.0)
MCH: 28.8 pg (ref 26.0–34.0)
MCHC: 31.7 g/dL — AB (ref 32.0–36.0)
MCV: 90.6 fL (ref 80.0–100.0)
PLATELETS: 247 10*3/uL (ref 150–440)
RBC: 3.01 MIL/uL — AB (ref 4.40–5.90)
RDW: 14.5 % (ref 11.5–14.5)
WBC: 7.1 10*3/uL (ref 3.8–10.6)

## 2015-09-07 LAB — BASIC METABOLIC PANEL
Anion gap: 7 (ref 5–15)
BUN: 62 mg/dL — ABNORMAL HIGH (ref 6–20)
CHLORIDE: 116 mmol/L — AB (ref 101–111)
CO2: 23 mmol/L (ref 22–32)
CREATININE: 4.19 mg/dL — AB (ref 0.61–1.24)
Calcium: 9 mg/dL (ref 8.9–10.3)
GFR, EST AFRICAN AMERICAN: 18 mL/min — AB (ref >60)
GFR, EST NON AFRICAN AMERICAN: 16 mL/min — AB (ref >60)
Glucose, Bld: 104 mg/dL — ABNORMAL HIGH (ref 65–99)
Potassium: 4.9 mmol/L (ref 3.5–5.1)
SODIUM: 146 mmol/L — AB (ref 135–145)

## 2015-09-07 LAB — BLOOD GAS, ARTERIAL
ACID-BASE DEFICIT: 4.4 mmol/L — AB (ref 0.0–2.0)
ACID-BASE DEFICIT: 4.2 mmol/L — AB (ref 0.0–2.0)
ALLENS TEST (PASS/FAIL): POSITIVE — AB
Allens test (pass/fail): POSITIVE — AB
BICARBONATE: 22.1 meq/L (ref 21.0–28.0)
Bicarbonate: 22.1 mEq/L (ref 21.0–28.0)
FIO2: 0.5
FIO2: 0.35
LHR: 20 {breaths}/min
Mechanical Rate: 20
O2 SAT: 98.5 %
O2 Saturation: 99.3 %
PATIENT TEMPERATURE: 37
PEEP/CPAP: 10 cmH2O
PEEP/CPAP: 5 cmH2O
PH ART: 7.3 — AB (ref 7.350–7.450)
PO2 ART: 126 mmHg — AB (ref 83.0–108.0)
Patient temperature: 37
VT: 500 mL
VT: 500 mL
pCO2 arterial: 46 mmHg (ref 32.0–48.0)
pCO2 arterial: 45 mmHg (ref 32.0–48.0)
pH, Arterial: 7.29 — ABNORMAL LOW (ref 7.350–7.450)
pO2, Arterial: 165 mmHg — ABNORMAL HIGH (ref 83.0–108.0)

## 2015-09-07 LAB — GLUCOSE, CAPILLARY
GLUCOSE-CAPILLARY: 103 mg/dL — AB (ref 65–99)
GLUCOSE-CAPILLARY: 79 mg/dL (ref 65–99)
GLUCOSE-CAPILLARY: 96 mg/dL (ref 65–99)
GLUCOSE-CAPILLARY: 98 mg/dL (ref 65–99)
Glucose-Capillary: 102 mg/dL — ABNORMAL HIGH (ref 65–99)

## 2015-09-07 MED ORDER — FREE WATER
200.0000 mL | Status: DC
  Administered 2015-09-07 – 2015-09-08 (×4): 200 mL

## 2015-09-07 MED ORDER — LIDOCAINE HCL (PF) 1 % IJ SOLN
0.0000 mL | Freq: Once | INTRAMUSCULAR | Status: DC | PRN
  Filled 2015-09-07: qty 30

## 2015-09-07 MED ORDER — VITAL HIGH PROTEIN PO LIQD
1000.0000 mL | ORAL | Status: DC
  Administered 2015-09-07 (×2): 1000 mL

## 2015-09-07 MED ORDER — IPRATROPIUM-ALBUTEROL 0.5-2.5 (3) MG/3ML IN SOLN
3.0000 mL | Freq: Four times a day (QID) | RESPIRATORY_TRACT | Status: DC
  Administered 2015-09-07 – 2015-09-09 (×8): 3 mL via RESPIRATORY_TRACT
  Filled 2015-09-07 (×8): qty 3

## 2015-09-07 MED ORDER — PRO-STAT SUGAR FREE PO LIQD
30.0000 mL | Freq: Two times a day (BID) | ORAL | Status: DC
  Administered 2015-09-07: 30 mL

## 2015-09-07 MED ORDER — DEXMEDETOMIDINE HCL IN NACL 400 MCG/100ML IV SOLN
0.4000 ug/kg/h | INTRAVENOUS | Status: AC
  Administered 2015-09-07: 0.5 ug/kg/h via INTRAVENOUS
  Administered 2015-09-07 – 2015-09-08 (×6): 1 ug/kg/h via INTRAVENOUS
  Filled 2015-09-07 (×7): qty 100

## 2015-09-07 MED ORDER — PANTOPRAZOLE SODIUM 40 MG PO PACK
40.0000 mg | PACK | Freq: Every day | ORAL | Status: DC
  Administered 2015-09-07: 40 mg
  Filled 2015-09-07: qty 20

## 2015-09-07 NOTE — Progress Notes (Signed)
[facility] [place] [facility]   PATIENT NAME: Ross [name]    MR#:  [id number]  DATE OF BIRTH:  30-Dec-1965    CHIEF COMPLAINT:   Chief Complaint  Patient presents with  . Seizures   Intubated. Sedated. Chest tube placed today  REVIEW OF SYSTEMS:   Review of Systems  Unable to perform ROS: intubated  Constitutional: Negative for fever and chills.  HENT: Negative for hearing loss.   Eyes: Negative for blurred vision, double vision and photophobia.  Respiratory: Negative for cough, hemoptysis, sputum production and wheezing.   Cardiovascular: Negative for chest pain, palpitations, orthopnea and leg swelling.  Gastrointestinal: Negative for nausea, vomiting, abdominal pain and diarrhea.  Genitourinary: Negative for dysuria and urgency.  Musculoskeletal: Negative for neck pain.  Skin: Negative for rash.  Neurological: Negative for dizziness, focal weakness, seizures, weakness and headaches.  Psychiatric/Behavioral: Negative for memory loss. The patient does not have insomnia.     DRUG ALLERGIES:   Allergies  Allergen Reactions  . Antihistamines, Diphenhydramine-Type Anaphylaxis and Rash  . Benadryl [Diphenhydramine Hcl] Anaphylaxis and Rash  . Hydroxyzine Anaphylaxis and Rash    VITALS:  Blood pressure 126/82, pulse 73, temperature 99.4 F (37.4 C), temperature source Axillary, resp. rate 15, height  (1.803 m), weight 111.3 kg (245 lb 6 oz), SpO2 99 %.  PHYSICAL EXAMINATION:  GENERAL:  49 y.o.-year-old patient  intubated, sedated, critically ill  HEENT: Pupils equal round and reactive, oral mucous membranes are moist, orally intubated Pulmonary: Coarse mechanical ventilations, good air movement decreased at bases, chest tube in left chest, bloody drainage  CARDIOVASCULAR: S1, S2 normal. No murmurs, rubs, or gallops. ABDOMEN: Soft, nontender, nondistended. Bowel sounds present. No organomegaly or mass.  EXTREMITIES: +1 edema  bilaterally edema. No cyanosis, or clubbing. Peripheral pulses 2+ NEUROLOGIC: intubated/sedated. SKIN: No obvious rash, lesion, or ulcer.  Psych;.'intubated/sedated.  LABORATORY PANEL:   CBC  Recent Labs Lab 09/07/15 0447  WBC 7.1  HGB 8.7*  HCT 27.3*  PLT 247   ------------------------------------------------------------------------------------------------------------------  Chemistries   Recent Labs Lab 09/05/15 0507  09/07/15 0447  NA UNABLE TO PERFORM DUE TO LIPEMIC INTERFERENCE  < > 146*  K 4.9  < > 4.9  CL 113*  < > 116*  CO2 23  < > 23  GLUCOSE 114*  < > 104*  BUN 61*  < > 62*  CREATININE 4.54*  < > 4.19*  CALCIUM 8.1*  < > 9.0  AST 40  --   --   ALT 49  --   --   ALKPHOS 208*  --   --   BILITOT 0.8  --   --   < > = values in this interval not displayed. ------------------------------------------------------------------------------------------------------------------  Cardiac Enzymes No results for input(s): TROPONINI in the last 168 hours. ------------------------------------------------------------------------------------------------------------------  RADIOLOGY:  Dg Chest 1 View  09/07/2015  CLINICAL DATA:  Intubation. EXAM: CHEST 1 VIEW COMPARISON:  09/06/2015. FINDINGS: Endotracheal tube, NG tube, left PICC line in stable position. Heart size stable. Persistent consolidation of the left lung consistent with atelectasis and/or pneumonia. Persistent left pleural effusion. No interim change. IMPRESSION: 1. Lines and tubes in stable position. 2. Persistent consolidation of the left lung consistent atelectasis and/or pneumonia. Persistent left pleural effusion. No interim change . Electronically Signed   By: Maisie Fus  Register   On: 09/07/2015 07:12   Dg Chest 1 View  09/06/2015  CLINICAL DATA:  Shortness of breath for a prior left thoracentesis. EXAM: CHEST  1 VIEW COMPARISON:  Chest radiograph 09/04/2015. FINDINGS: ET tube terminates in the mid trachea.  Enteric tube courses inferior to the diaphragm. Multiple monitoring leads overlie the patient. Stable cardiac and mediastinal contours. Minimal atelectasis right base. Large layering left pleural effusion with minimal aeration of the left upper lung, grossly stable when compared to prior. No definite pneumothorax. IMPRESSION: Persistent large layering left pleural effusion with minimal aeration of the left upper lung. Consolidative opacities within the remainder of left lung likely secondary to underlying atelectasis. Stable support apparatus. Electronically Signed   By: Annia Beltrew  Davis M.D.   On: 09/06/2015 09:25   Ct Chest Wo Contrast  09/07/2015  CLINICAL DATA:  Pleural effusion EXAM: CT CHEST WITHOUT CONTRAST TECHNIQUE: Multidetector CT imaging of the chest was performed following the standard protocol without IV contrast. COMPARISON:  None. FINDINGS: Large left pleural effusion is associated with compressive atelectasis of a large portion of the left lung. The left lower lobe mainstem bronchus has an abnormal appearance with narrowing and possible compression. Endobronchial lesion is not excluded. Tiny right pleural effusion and minimal right basilar atelectasis. Endotracheal tube and NG tube are in place. Left upper extremity PICC in place with its tip at the cavoatrial junction. Minimal coronary artery calcifications in the LAD territory. No abnormal mediastinal adenopathy.  No pericardial effusion. IMPRESSION: Large left pleural effusion is associated with compressive atelectasis of much of the left lung. Endobronchial lesion in the left lower lobe bronchus is not excluded. Bronchoscopy may be helpful. Tiny right pleural effusion and minimal right basilar atelectasis. Electronically Signed   By: Jolaine ClickArthur  Hoss M.D.   On: 09/07/2015 13:37   Dg Chest Port 1 View  09/07/2015  CLINICAL DATA:  Chest tube placement. EXAM: PORTABLE CHEST 1 VIEW COMPARISON:  Same day. FINDINGS: Stable cardiomediastinal silhouette.  Endotracheal and nasogastric tubes are unchanged in position. Left-sided PICC line is unchanged in position with distal tip in expected position of right atrium. No pneumothorax is noted. Right lung is clear. Interval placement of left-sided chest tube with tip in left lung apex. Stable moderate left pleural effusion is noted with probable left lower lobe atelectasis. Bony thorax is unremarkable. IMPRESSION: Interval placement of left-sided chest tube with continued presence of moderate left pleural effusion with probable left lower lobe atelectasis. Stable support apparatus. Left-sided PICC line tip is again noted in right atrium which is unchanged compared to prior exam ; withdrawal by 3-4 cm is recommended. These results will be called to the ordering clinician or representative by the Radiologist Assistant, and communication documented in the PACS or zVision Dashboard. Electronically Signed   By: Lupita RaiderJames  Green Jr, M.D.   On: 09/07/2015 15:39   [place] Thoracentesis Asp Pleural Space W/img Guide  09/07/2015  CLINICAL DATA:  Left pleural effusion. EXAM: ULTRASOUND GUIDED left THORACENTESIS COMPARISON:  September 04, 2015 PROCEDURE: Informed consent was obtained from the patient's family. Ultrasound was performed to localize and mark an adequate pocket of fluid in the left chest. It should be noted that multiple septations are noted within the effusion currently, consistent with multiloculated effusion. The area was then prepped and draped in the normal sterile fashion. 1% Lidocaine was used for local anesthesia. Under ultrasound guidance a Safe-T-Centesis catheter was introduced. Thoracentesis was performed. The catheter was removed and a dressing applied. COMPLICATIONS: None. FINDINGS: A total of approximately 35 mL of serosanguineous fluid was removed. A fluid sample wassent for laboratory analysis. IMPRESSION: Successful ultrasound guided left thoracentesis yielding 35 mL of pleural fluid.  Multiple septations are  now noted within the effusion consistent with multiloculated effusion. Electronically Signed   By: Lupita Raider, M.D.   On: 09/07/2015 12:23    EKG:   Orders placed or performed during the hospital encounter of 08/21/15  . EKG 12-Lead  . EKG 12-Lead    ASSESSMENT AND PLAN:    #1 acute respiratory failure with hypoxia and altered mental status:  - Pulmonary CCM following - Intubated 11/4 through 11/9 and then re-intubated 11/15 through present - Now with pneumonia pleural effusion, thoracentesis 11/18 with removal of 1.8 L of fluid, exudative effusion, reaccumulation and chest tube placed today 11/21 - Sputum culture growing Citrobacter covering with Levaquin  2 Septic shock secondary to pneumonia and also possible line sepsis: See above.  - Left neck line removed, right groin hemodialysis catheter removed - Antifungals discontinued today - Off levo fed since 11/19   #3 Rhabdomyolysis: Resolved - Possibly due to prolonged immobilization, hypotension, diarrhea, possible withdrawal seizures prior to admission.   #4 acute renal failure: secondary to ATN from one depletion, rhabdomyolysis, C. Difficile. Good urine output. No need for hemodialysis at this time. 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 Albumin very low. Continue to monitor  6 .left leg edema;dvt study is negative.  #7 ,anxiety, mental disorder with PTSD: .schizophrenia;consult psych when extubated.   All the records are reviewed and case discussed with Care Management/Social Workerr. Management plans discussed with the patient, family and they are in agreement. Family is not interested in transfer to LTAC at this time. They feel that he should remain in our ICU until more stable.  CODE STATUS: Full   TOTAL CRITICAL CARE TIME TAKING CARE OF THIS PATIENT: 35 minutes.    POSSIBLE D/C  ? DAYS, DEPENDING ON CLINICAL CONDITION.   Elby Showers M.D on 09/07/2015 at 5:19  PM  Between 7am to 6pm - Pager - [phone]  After 6pm go to [email or web address] - password [email or web address] [facility]  [place] [facility]  Office  [phone]  CC: Primary care physician; No primary care provider on file.

## 2015-09-07 NOTE — Progress Notes (Signed)
[facility] CLINIC INFECTIOUS DISEASE PROGRESS NOTE Date of Admission:  08/21/2015     ID: Pasty Spillers is a 49 y.o. male with  C diff, sepsis,   Active Problems:   Sepsis (HCC)   Acute respiratory failure (HCC)   Enteritis due to Clostridium difficile   Rhabdomyolysis   Acute renal failure (HCC)   Acute respiratory failure with hypoxia (HCC)   Altered mental status   Subjective: Remains intubated. Off pressors Had thora done with 35 ml removed BUT CT shows larger collection  ROS  Eleven systems are reviewed and negative except per hpi  Medications:  Antibiotics Given (last 72 hours)    Date/Time Action Medication Dose Rate   09/04/15 1325 Given   piperacillin-tazobactam (ZOSYN) IVPB 3.375 g 3.375 g 12.5 mL/hr   09/04/15 1813 Given   vancomycin (VANCOCIN) 50 mg/mL oral solution 125 mg 125 mg    09/04/15 2335 Given   vancomycin (VANCOCIN) 50 mg/mL oral solution 125 mg 125 mg    09/05/15 0052 Given   piperacillin-tazobactam (ZOSYN) IVPB 3.375 g 3.375 g 12.5 mL/hr   09/05/15 0531 Given   vancomycin (VANCOCIN) 50 mg/mL oral solution 125 mg 125 mg    09/05/15 1333 Given  [pharm delay]   vancomycin (VANCOCIN) 50 mg/mL oral solution 125 mg 125 mg    09/05/15 1724 Given   levofloxacin (LEVAQUIN) IVPB 750 mg 750 mg 100 mL/hr     . antiseptic oral rinse  7 mL Mouth Rinse 10 times per day  . chlorhexidine gluconate  15 mL Mouth Rinse BID  . feeding supplement (PRO-STAT SUGAR FREE 64)  30 mL Per Tube BID  . free water  200 mL Per Tube Q4H  . heparin subcutaneous  5,000 Units Subcutaneous 3 times per day  . ipratropium-albuterol  3 mL Nebulization Q6H  . levofloxacin (LEVAQUIN) IV  750 mg Intravenous Q48H  . pantoprazole sodium  40 mg Per Tube Q1200    Objective: Vital signs in last 24 hours: Temp:  [98.2 F (36.8 C)-98.6 F (37 C)] 98.2 F (36.8 C) (11/21 0800) Pulse Rate:  [72-113] 89 (11/21 1200) Resp:  [14-28] 19 (11/21 1200) BP: (96-168)/(53-95) 121/79 mmHg  (11/21 1200) SpO2:  [93 %-100 %] 96 % (11/21 1200) FiO2 (%):  [35 %] 35 % (11/21 0754) Weight:  [111.3 kg (245 lb 6 oz)] 111.3 kg (245 lb 6 oz) (11/21 1610) Constitutional: intubated, sedated, hypotensive HENT: anicteric Mouth/Throat: ett in place Cardiovascular: tachy Pulmonary/Chest: mech breath sounds Abdominal: Soft. Bowel sounds are normal. He exhibits no distension. There is no tenderness.  Lymphadenopathy:  He has no cervical adenopathy.  Neurological:sedated  Skin: Skin is warm and dry. No rash noted. No erythema.  Access R groin HD cath site - line removed, no drainage  Lab Results  Recent Labs  09/06/15 0600 09/07/15 0447  WBC 7.7 7.1  HGB 9.0* 8.7*  HCT 28.0* 27.3*  NA 144 146*  K 5.1 4.9  CL 115* 116*  CO2 23 23  BUN 59* 62*  CREATININE 4.33* 4.19*    Microbiology: Results for orders placed or performed during the hospital encounter of 08/21/15  Blood Culture (routine x 2)     Status: None   Collection Time: 08/21/15 12:47 PM  Result Value Ref Range Status   Specimen Description BLOOD UNKNOWN  Final   Special Requests BOTTLES DRAWN AEROBIC AND ANAEROBIC  1CC  Final   Culture NO GROWTH 10 DAYS  Final   Report Status 08/31/2015 FINAL  Final  Blood Culture (routine x 2)     Status: None   Collection Time: 08/21/15 12:47 PM  Result Value Ref Range Status   Specimen Description BLOOD LEFT AC  Final   Special Requests BOTTLES DRAWN AEROBIC AND ANAEROBIC  6CC  Final   Culture NO GROWTH 10 DAYS  Final   Report Status 08/31/2015 FINAL  Final  Urine culture     Status: None   Collection Time: 08/21/15 12:47 PM  Result Value Ref Range Status   Specimen Description URINE, CATHETERIZED  Final   Special Requests Normal  Final   Culture NO GROWTH 2 DAYS  Final   Report Status 08/23/2015 FINAL  Final  MRSA PCR Screening     Status: None   Collection Time: 08/21/15 11:47 PM  Result Value Ref Range Status   MRSA by PCR NEGATIVE NEGATIVE Final    Comment:         The GeneXpert MRSA Assay (FDA approved for NASAL specimens only), is one component of a comprehensive MRSA colonization surveillance program. It is not intended to diagnose MRSA infection nor to guide or monitor treatment for MRSA infections.   C difficile quick scan w PCR reflex     Status: Abnormal   Collection Time: 08/22/15  2:31 PM  Result Value Ref Range Status   C Diff antigen POSITIVE (A) NEGATIVE Final   C Diff toxin POSITIVE (A) NEGATIVE Final   C Diff interpretation   Final    Positive for toxigenic C. difficile, active toxin production present.    Comment: CRITICAL RESULT CALLED TO, READ BACK BY AND VERIFIED WITH: CHARLIE FLEETWOOD,RN 08/22/2015 1544 BY J RAZZAKSUAREZ,MT   Culture, bal-quantitative     Status: None   Collection Time: 08/25/15  3:15 PM  Result Value Ref Range Status   Specimen Description BRONCHIAL ALVEOLAR LAVAGE  Final   Special Requests NONE  Final   Gram Stain   Final    GOOD SPECIMEN - 80-90% WBCS FEW WBC SEEN NO ORGANISMS SEEN    Culture HOLDING FOR POSSIBLE PATHOGEN  Final   Report Status 08/27/2015 FINAL  Final  Culture, blood (routine x 2)     Status: None (Preliminary result)   Collection Time: 09/02/15 12:00 PM  Result Value Ref Range Status   Specimen Description BLOOD RIGHT GROIN  Final   Special Requests   Final    BOTTLES DRAWN AEROBIC AND ANAEROBIC  6CC AERO 10CC ANAERO   Culture NO GROWTH 4 DAYS  Final   Report Status PENDING  Incomplete  Culture, blood (routine x 2)     Status: None (Preliminary result)   Collection Time: 09/02/15 12:15 PM  Result Value Ref Range Status   Specimen Description BLOOD RIGHT GROIN  Final   Special Requests BOTTLES DRAWN AEROBIC AND ANAEROBIC  10 CC  Final   Culture NO GROWTH 4 DAYS  Final   Report Status PENDING  Incomplete  Culture, blood (routine x 2)     Status: None (Preliminary result)   Collection Time: 09/02/15  1:47 PM  Result Value Ref Range Status   Specimen Description BLOOD RIGHT  HAND  Final   Special Requests BOTTLES DRAWN AEROBIC AND ANAEROBIC 4CC  Final   Culture NO GROWTH 4 DAYS  Final   Report Status PENDING  Incomplete  Culture, blood (routine x 2)     Status: None (Preliminary result)   Collection Time: 09/02/15  2:06 PM  Result Value Ref Range Status  Specimen Description BLOOD LEFT HAND  Final   Special Requests BOTTLES DRAWN AEROBIC AND ANAEROBIC 1CC  Final   Culture NO GROWTH 4 DAYS  Final   Report Status PENDING  Incomplete  MRSA PCR Screening     Status: None   Collection Time: 09/02/15 10:27 PM  Result Value Ref Range Status   MRSA by PCR NEGATIVE NEGATIVE Final    Comment:        The GeneXpert MRSA Assay (FDA approved for NASAL specimens only), is one component of a comprehensive MRSA colonization surveillance program. It is not intended to diagnose MRSA infection nor to guide or monitor treatment for MRSA infections.   Culture, expectorated sputum-assessment     Status: None   Collection Time: 09/03/15 12:18 AM  Result Value Ref Range Status   Specimen Description SPUTUM  Final   Special Requests Normal  Final   Sputum evaluation THIS SPECIMEN IS ACCEPTABLE FOR SPUTUM CULTURE  Final   Report Status 09/03/2015 FINAL  Final  Culture, respiratory (NON-Expectorated)     Status: None   Collection Time: 09/03/15 12:18 AM  Result Value Ref Range Status   Specimen Description SPUTUM  Final   Special Requests Normal Reflexed from [id number]  Final   Gram Stain   Final    EXCELLENT SPECIMEN - 90-100% WBCS MANY WBC SEEN MANY GRAM NEGATIVE COCCOBACILLI    Culture MODERATE GROWTH CITROBACTER KOSERI  Final   Report Status 09/05/2015 FINAL  Final   Organism ID, Bacteria CITROBACTER KOSERI  Final      Susceptibility   Citrobacter koseri - MIC*    CEFAZOLIN <=4 SENSITIVE Sensitive     CEFTRIAXONE <=1 SENSITIVE Sensitive     CIPROFLOXACIN <=0.25 SENSITIVE Sensitive     GENTAMICIN <=1 SENSITIVE Sensitive     IMIPENEM <=0.25 SENSITIVE Sensitive      TRIMETH/SULFA <=20 SENSITIVE Sensitive     PIP/TAZO Value in next row Sensitive      SENSITIVE<=4    LEVOFLOXACIN Value in next row Sensitive      SENSITIVE<=0.12    * MODERATE GROWTH CITROBACTER KOSERI  Body fluid culture     Status: None (Preliminary result)   Collection Time: 09/04/15 10:30 AM  Result Value Ref Range Status   Specimen Description CYTO PLEU  Final   Special Requests NONE  Final   Gram Stain   Final    MODERATE WBC SEEN TOO NUMEROUS TO COUNT RED BLOOD CELLS NO ORGANISMS SEEN    Culture NO GROWTH 3 DAYS  Final   Report Status PENDING  Incomplete    Studies/Results: Dg Chest 1 View  09/07/2015  CLINICAL DATA:  Intubation. EXAM: CHEST 1 VIEW COMPARISON:  09/06/2015. FINDINGS: Endotracheal tube, NG tube, left PICC line in stable position. Heart size stable. Persistent consolidation of the left lung consistent with atelectasis and/or pneumonia. Persistent left pleural effusion. No interim change. IMPRESSION: 1. Lines and tubes in stable position. 2. Persistent consolidation of the left lung consistent atelectasis and/or pneumonia. Persistent left pleural effusion. No interim change . Electronically Signed   By: Maisie Fus  Register   On: 09/07/2015 07:12   Dg Chest 1 View  09/06/2015  CLINICAL DATA:  Shortness of breath for a prior left thoracentesis. EXAM: CHEST 1 VIEW COMPARISON:  Chest radiograph 09/04/2015. FINDINGS: ET tube terminates in the mid trachea. Enteric tube courses inferior to the diaphragm. Multiple monitoring leads overlie the patient. Stable cardiac and mediastinal contours. Minimal atelectasis right base. Large layering left pleural effusion with  minimal aeration of the left upper lung, grossly stable when compared to prior. No definite pneumothorax. IMPRESSION: Persistent large layering left pleural effusion with minimal aeration of the left upper lung. Consolidative opacities within the remainder of left lung likely secondary to underlying atelectasis.  Stable support apparatus. Electronically Signed   By: Annia Belt M.D.   On: 09/06/2015 09:25   US Thoracentesis Asp Pleural Space W/img Guide  09/07/2015  CLINICAL DATA:  Left pleural effusion. EXAM: ULTRASOUND GUIDED left THORACENTESIS COMPARISON:  September 04, 2015 PROCEDURE: Informed consent was obtained from the patient's family. Ultrasound was performed to localize and mark an adequate pocket of fluid in the left chest. It should be noted that multiple septations are noted within the effusion currently, consistent with multiloculated effusion. The area was then prepped and draped in the normal sterile fashion. 1% Lidocaine was used for local anesthesia. Under ultrasound guidance a Safe-T-Centesis catheter was introduced. Thoracentesis was performed. The catheter was removed and a dressing applied. COMPLICATIONS: None. FINDINGS: A total of approximately 35 mL of serosanguineous fluid was removed. A fluid sample wassent for laboratory analysis. IMPRESSION: Successful ultrasound guided left thoracentesis yielding 35 mL of pleural fluid. Multiple septations are now noted within the effusion consistent with multiloculated effusion. Electronically Signed   By: Lupita Raider, M.D.   On: 09/07/2015 12:23    Assessment/Plan: MANTAJ CHAMBERLIN is a 49 y.o. male with prolonged admission after being found unresponsive, with ARF, rhabdo, sepsis, prolonged intubated, now with repeat sepsis in setting of HD with possible recurrent PNA and two central lines in place. Also has C diff. HIV is negative. BCX ngtd.  Sputum cx with citrobacter. Ct Shows reaccumulation of L pleurl fluid. R HD cath removed. L neck line removed.   ABX  Anidulafungin 11/16-> Levo 11/19->   Recommendations Would DC anidulafungin as lines removed Likely will need chest tube Continue levofloxacin Thank you very much for the consult. Will follow with you.  Gor Vestal   09/07/2015, 12:53 PM

## 2015-09-07 NOTE — Progress Notes (Signed)
PULMONARY / CRITICAL CARE MEDICINE   Name: Ross [name] MRN: [id number] DOB: 01/03/1966    ADMISSION DATE:  08/21/2015 CONSULTATION DATE:  11/05  PT PROFILE: 58 M admitted 11/04 with AMS, acute respiratory failure, possible seizures, recent C diff infection, AKI on CKD. Hospitalization c/b HAP, parapneumonic effusion, failed self-extubation X 2   MAJOR EVENTS/TEST RESULTS: 11/4 intubated 11/5 failed vent wean due to encephalopathy and acidosis 11/6 RIJ CVL, R Fem Vein VasCath 11/8 bronchoscopy with BAL 11/8 thoracentesis, 1800 cc removed.  11/9 failed self extubation 11/15 transferred back to ICU 11/15 intubated 11/18 Thoracentesis - 1800 cc serosanguinous fluid removed. Exudative chemistries. Cultures negative 11/21 thoracentesis - loculations on [place]. 35 cc serosanguinous fluid removed. 11/21 CT chest: IMPRESSION: Large left pleural effusion is associated with compressive atelectasis of much of the left lung. Endobronchial lesion in the left lower lobe bronchus is not excluded. 11/21 L chest tube placed  INDWELLING DEVICES:: ETT 11/04 >> 11/09, 11/09 >> out, 11/15 >>  R IJ CVL 11/06 >> 11/16 R femoral HD cath 11/06 >> 11/16 LUE PICC 11/16 >>   MICRO DATA: Results for orders placed or performed during the hospital encounter of 08/21/15  Blood Culture (routine x 2)     Status: None   Collection Time: 08/21/15 12:47 PM  Result Value Ref Range Status   Specimen Description BLOOD UNKNOWN  Final   Special Requests BOTTLES DRAWN AEROBIC AND ANAEROBIC  1CC  Final   Culture NO GROWTH 10 DAYS  Final   Report Status 08/31/2015 FINAL  Final  Blood Culture (routine x 2)     Status: None   Collection Time: 08/21/15 12:47 PM  Result Value Ref Range Status   Specimen Description BLOOD LEFT AC  Final   Special Requests BOTTLES DRAWN AEROBIC AND ANAEROBIC  6CC  Final   Culture NO GROWTH 10 DAYS  Final   Report Status 08/31/2015 FINAL  Final  Urine culture     Status: None    Collection Time: 08/21/15 12:47 PM  Result Value Ref Range Status   Specimen Description URINE, CATHETERIZED  Final   Special Requests Normal  Final   Culture NO GROWTH 2 DAYS  Final   Report Status 08/23/2015 FINAL  Final  MRSA PCR Screening     Status: None   Collection Time: 08/21/15 11:47 PM  Result Value Ref Range Status   MRSA by PCR NEGATIVE NEGATIVE Final    Comment:        The GeneXpert MRSA Assay (FDA approved for NASAL specimens only), is one component of a comprehensive MRSA colonization surveillance program. It is not intended to diagnose MRSA infection nor to guide or monitor treatment for MRSA infections.   C difficile quick scan w PCR reflex     Status: Abnormal   Collection Time: 08/22/15  2:31 PM  Result Value Ref Range Status   C Diff antigen POSITIVE (A) NEGATIVE Final   C Diff toxin POSITIVE (A) NEGATIVE Final   C Diff interpretation   Final    Positive for toxigenic C. difficile, active toxin production present.    Comment: CRITICAL RESULT CALLED TO, READ BACK BY AND VERIFIED WITH: CHARLIE FLEETWOOD,RN 08/22/2015 1544 BY J RAZZAKSUAREZ,MT   Culture, bal-quantitative     Status: None   Collection Time: 08/25/15  3:15 PM  Result Value Ref Range Status   Specimen Description BRONCHIAL ALVEOLAR LAVAGE  Final   Special Requests NONE  Final   Gram Stain   Final  GOOD SPECIMEN - 80-90% WBCS FEW WBC SEEN NO ORGANISMS SEEN    Culture HOLDING FOR POSSIBLE PATHOGEN  Final   Report Status 08/27/2015 FINAL  Final  Culture, blood (routine x 2)     Status: None (Preliminary result)   Collection Time: 09/02/15 12:00 PM  Result Value Ref Range Status   Specimen Description BLOOD RIGHT GROIN  Final   Special Requests   Final    BOTTLES DRAWN AEROBIC AND ANAEROBIC  6CC AERO 10CC ANAERO   Culture NO GROWTH 4 DAYS  Final   Report Status PENDING  Incomplete  Culture, blood (routine x 2)     Status: None (Preliminary result)   Collection Time: 09/02/15 12:15 PM   Result Value Ref Range Status   Specimen Description BLOOD RIGHT GROIN  Final   Special Requests BOTTLES DRAWN AEROBIC AND ANAEROBIC  10 CC  Final   Culture NO GROWTH 4 DAYS  Final   Report Status PENDING  Incomplete  Culture, blood (routine x 2)     Status: None (Preliminary result)   Collection Time: 09/02/15  1:47 PM  Result Value Ref Range Status   Specimen Description BLOOD RIGHT HAND  Final   Special Requests BOTTLES DRAWN AEROBIC AND ANAEROBIC 4CC  Final   Culture NO GROWTH 4 DAYS  Final   Report Status PENDING  Incomplete  Culture, blood (routine x 2)     Status: None (Preliminary result)   Collection Time: 09/02/15  2:06 PM  Result Value Ref Range Status   Specimen Description BLOOD LEFT HAND  Final   Special Requests BOTTLES DRAWN AEROBIC AND ANAEROBIC 1CC  Final   Culture NO GROWTH 4 DAYS  Final   Report Status PENDING  Incomplete  MRSA PCR Screening     Status: None   Collection Time: 09/02/15 10:27 PM  Result Value Ref Range Status   MRSA by PCR NEGATIVE NEGATIVE Final    Comment:        The GeneXpert MRSA Assay (FDA approved for NASAL specimens only), is one component of a comprehensive MRSA colonization surveillance program. It is not intended to diagnose MRSA infection nor to guide or monitor treatment for MRSA infections.   Culture, expectorated sputum-assessment     Status: None   Collection Time: 09/03/15 12:18 AM  Result Value Ref Range Status   Specimen Description SPUTUM  Final   Special Requests Normal  Final   Sputum evaluation THIS SPECIMEN IS ACCEPTABLE FOR SPUTUM CULTURE  Final   Report Status 09/03/2015 FINAL  Final  Culture, respiratory (NON-Expectorated)     Status: None   Collection Time: 09/03/15 12:18 AM  Result Value Ref Range Status   Specimen Description SPUTUM  Final   Special Requests Normal Reflexed from [id number]  Final   Gram Stain   Final    EXCELLENT SPECIMEN - 90-100% WBCS MANY WBC SEEN MANY GRAM NEGATIVE COCCOBACILLI     Culture MODERATE GROWTH CITROBACTER KOSERI  Final   Report Status 09/05/2015 FINAL  Final   Organism ID, Bacteria CITROBACTER KOSERI  Final      Susceptibility   Citrobacter koseri - MIC*    CEFAZOLIN <=4 SENSITIVE Sensitive     CEFTRIAXONE <=1 SENSITIVE Sensitive     CIPROFLOXACIN <=0.25 SENSITIVE Sensitive     GENTAMICIN <=1 SENSITIVE Sensitive     IMIPENEM <=0.25 SENSITIVE Sensitive     TRIMETH/SULFA <=20 SENSITIVE Sensitive     PIP/TAZO Value in next row Sensitive  SENSITIVE<=4    LEVOFLOXACIN Value in next row Sensitive      SENSITIVE<=0.12    * MODERATE GROWTH CITROBACTER KOSERI  Body fluid culture     Status: None (Preliminary result)   Collection Time: 09/04/15 10:30 AM  Result Value Ref Range Status   Specimen Description CYTO PLEU  Final   Special Requests NONE  Final   Gram Stain   Final    MODERATE WBC SEEN TOO NUMEROUS TO COUNT RED BLOOD CELLS NO ORGANISMS SEEN    Culture NO GROWTH 3 DAYS  Final   Report Status PENDING  Incomplete     ANTIMICROBIALS:  Anti-infectives    Start     Dose/Rate Route Frequency Ordered Stop   09/05/15 1800  levofloxacin (LEVAQUIN) IVPB 750 mg     750 mg 100 mL/hr over 90 Minutes Intravenous Every 48 hours 09/05/15 1544     09/04/15 1200  vancomycin (VANCOCIN) IVPB 1000 mg/200 mL premix  Status:  Discontinued     1,000 mg 200 mL/hr over 60 Minutes Intravenous Every M-W-F (Hemodialysis) 09/02/15 1418 09/04/15 1059   09/03/15 2000  anidulafungin (ERAXIS) 100 mg in sodium chloride 0.9 % 100 mL IVPB  Status:  Discontinued     100 mg over 90 Minutes Intravenous Every 24 hours 09/02/15 1620 09/07/15 1013   09/02/15 2000  anidulafungin (ERAXIS) 200 mg in sodium chloride 0.9 % 200 mL IVPB     200 mg over 180 Minutes Intravenous  Once 09/02/15 1812 09/02/15 2307   09/02/15 1630  anidulafungin (ERAXIS) 200 mg in sodium chloride 0.9 % 200 mL IVPB  Status:  Discontinued     200 mg over 180 Minutes Intravenous  Once 09/02/15 1620 09/02/15  2132   09/02/15 1600  piperacillin-tazobactam (ZOSYN) IVPB 3.375 g  Status:  Discontinued     3.375 g 12.5 mL/hr over 240 Minutes Intravenous Every 12 hours 09/02/15 1053 09/02/15 1307   09/02/15 1430  vancomycin (VANCOCIN) 2,000 mg in sodium chloride 0.9 % 500 mL IVPB     2,000 mg 250 mL/hr over 120 Minutes Intravenous  Once 09/02/15 1416 09/02/15 1820   09/02/15 1315  piperacillin-tazobactam (ZOSYN) IVPB 3.375 g  Status:  Discontinued     3.375 g 12.5 mL/hr over 240 Minutes Intravenous Every 12 hours 09/02/15 1307 09/05/15 1447   08/29/15 1700  vancomycin (VANCOCIN) IVPB 1000 mg/200 mL premix  Status:  Discontinued     1,000 mg 200 mL/hr over 60 Minutes Intravenous every 72 hours 08/26/15 1555 08/27/15 1023   08/27/15 1000  meropenem (MERREM) 1 g in sodium chloride 0.9 % 100 mL IVPB  Status:  Discontinued     1 g 200 mL/hr over 30 Minutes Intravenous Every 24 hours 08/26/15 1555 08/27/15 1023   08/26/15 1800  vancomycin (VANCOCIN) 500 mg in sodium chloride irrigation 0.9 % 100 mL ENEMA  Status:  Discontinued     500 mg Rectal 4 times per day 08/26/15 1531 08/27/15 1023   08/26/15 1700  vancomycin (VANCOCIN) IVPB 1000 mg/200 mL premix     1,000 mg 200 mL/hr over 60 Minutes Intravenous  Once 08/26/15 1555 08/26/15 1723   08/25/15 1600  meropenem (MERREM) 1 g in sodium chloride 0.9 % 100 mL IVPB  Status:  Discontinued     1 g 200 mL/hr over 30 Minutes Intravenous Every 12 hours 08/25/15 1545 08/26/15 1555   08/25/15 1600  vancomycin (VANCOCIN) IVPB 1000 mg/200 mL premix  Status:  Discontinued  1,000 mg 200 mL/hr over 60 Minutes Intravenous Every 24 hours 08/25/15 1545 08/26/15 1555   08/24/15 1200  vancomycin (VANCOCIN) 50 mg/mL oral solution 125 mg  Status:  Discontinued     125 mg Oral 4 times per day 08/24/15 1035 09/05/15 1447   08/22/15 2000  vancomycin (VANCOCIN) IVPB 1000 mg/200 mL premix  Status:  Discontinued     1,000 mg 200 mL/hr over 60 Minutes Intravenous Every 24 hours  08/21/15 1439 08/22/15 0956   08/22/15 1800  vancomycin (VANCOCIN) 50 mg/mL oral solution 500 mg  Status:  Discontinued     500 mg Oral 4 times per day 08/22/15 1547 08/24/15 1035   08/22/15 1600  vancomycin (VANCOCIN) IVPB 1000 mg/200 mL premix  Status:  Discontinued     1,000 mg 200 mL/hr over 60 Minutes Intravenous Every 18 hours 08/22/15 0956 08/22/15 1001   08/22/15 1600  vancomycin (VANCOCIN) 1,250 mg in sodium chloride 0.9 % 250 mL IVPB  Status:  Discontinued     1,250 mg 166.7 mL/hr over 90 Minutes Intravenous Every 18 hours 08/22/15 1001 08/23/15 0827   08/22/15 1200  vancomycin (VANCOCIN) 50 mg/mL oral solution 125 mg  Status:  Discontinued     125 mg Oral 4 times per day 08/22/15 1035 08/22/15 1550   08/21/15 2200  piperacillin-tazobactam (ZOSYN) IVPB 3.375 g  Status:  Discontinued     3.375 g 12.5 mL/hr over 240 Minutes Intravenous 3 times per day 08/21/15 1431 08/23/15 0827   08/21/15 2000  vancomycin (VANCOCIN) IVPB 1000 mg/200 mL premix     1,000 mg 200 mL/hr over 60 Minutes Intravenous  Once 08/21/15 1439 08/21/15 2248   08/21/15 1300  piperacillin-tazobactam (ZOSYN) IVPB 3.375 g     3.375 g 100 mL/hr over 30 Minutes Intravenous  Once 08/21/15 1245 08/21/15 1457   08/21/15 1300  vancomycin (VANCOCIN) IVPB 1000 mg/200 mL premix     1,000 mg 200 mL/hr over 60 Minutes Intravenous  Once 08/21/15 1245 08/21/15 1457       SUBJECTIVE:  RASS -2, not F/C. Failed SBT  VITAL SIGNS: BP 121/79 mmHg  Pulse 89  Temp(Src) 98.2 F (36.8 C) (Axillary)  Resp 19  Ht 5\' 11"  (1.803 m)  Wt 111.3 kg (245 lb 6 oz)  BMI 34.24 kg/m2  SpO2 96%  HEMODYNAMICS:    VENTILATOR SETTINGS: Vent Mode:  [-] PRVC FiO2 (%):  [35 %] 35 % Set Rate:  [20 bmp] 20 bmp Vt Set:  [500 mL] 500 mL PEEP:  [5 cmH20] 5 cmH20 Plateau Pressure:  [14 cmH20] 14 cmH20  INTAKE / OUTPUT: I/O last 3 completed shifts: In: 2859.3 [I.V.:1619.3; NG/GT:980; IV Piggyback:260] Out: 4396 [Urine:4195;  Stool:201]  PHYSICAL EXAMINATION: General: RASS -2, not F/C, intubated Neuro: CNs intact, MAEs HEENT: NCAT, WNL Cardiovascular: reg, no M Lungs:diminished on L Abdomen: soft, NT, +BS Ext: warm, no edema  LABS:  CBC  Recent Labs Lab 09/05/15 0507 09/06/15 0600 09/07/15 0447  WBC 7.4 7.7 7.1  HGB 8.7* 9.0* 8.7*  HCT 26.2* 28.0* 27.3*  PLT 204 250 247   Coag's No results for input(s): APTT, INR in the last 168 hours. BMET  Recent Labs Lab 09/05/15 0507 09/06/15 0600 09/07/15 0447  NA UNABLE TO PERFORM DUE TO LIPEMIC INTERFERENCE 144 146*  K 4.9 5.1 4.9  CL 113* 115* 116*  CO2 23 23 23   BUN 61* 59* 62*  CREATININE 4.54* 4.33* 4.19*  GLUCOSE 114* 106* 104*   Electrolytes  Recent  Labs Lab 09/05/15 0507 09/06/15 0600 09/07/15 0447  CALCIUM 8.1* 8.9 9.0   Sepsis Markers No results for input(s): LATICACIDVEN, PROCALCITON, O2SATVEN in the last 168 hours. ABG  Recent Labs Lab 09/02/15 1555 09/06/15 0400 09/07/15 0414  PHART 7.29* 7.32* 7.30*  PCO2ART 46 44 45  PO2ART 126* 137* 165*   Liver Enzymes  Recent Labs Lab 09/05/15 0507  AST 40  ALT 49  ALKPHOS 208*  BILITOT 0.8  ALBUMIN 1.8*   Cardiac Enzymes No results for input(s): TROPONINI, PROBNP in the last 168 hours. Glucose  Recent Labs Lab 09/06/15 1618 09/06/15 1957 09/06/15 2230 09/07/15 0400 09/07/15 0723 09/07/15 1134  GLUCAP 94 90 76 79 96 98    Imaging Dg Chest 1 View  09/07/2015  CLINICAL DATA:  Intubation. EXAM: CHEST 1 VIEW COMPARISON:  09/06/2015. FINDINGS: Endotracheal tube, NG tube, left PICC line in stable position. Heart size stable. Persistent consolidation of the left lung consistent with atelectasis and/or pneumonia. Persistent left pleural effusion. No interim change. IMPRESSION: 1. Lines and tubes in stable position. 2. Persistent consolidation of the left lung consistent atelectasis and/or pneumonia. Persistent left pleural effusion. No interim change .  Electronically Signed   By: Maisie Fus  Register   On: 09/07/2015 07:12   US Thoracentesis Asp Pleural Space W/img Guide  09/07/2015  CLINICAL DATA:  Left pleural effusion. EXAM: ULTRASOUND GUIDED left THORACENTESIS COMPARISON:  September 04, 2015 PROCEDURE: Informed consent was obtained from the patient's family. Ultrasound was performed to localize and mark an adequate pocket of fluid in the left chest. It should be noted that multiple septations are noted within the effusion currently, consistent with multiloculated effusion. The area was then prepped and draped in the normal sterile fashion. 1% Lidocaine was used for local anesthesia. Under ultrasound guidance a Safe-T-Centesis catheter was introduced. Thoracentesis was performed. The catheter was removed and a dressing applied. COMPLICATIONS: None. FINDINGS: A total of approximately 35 mL of serosanguineous fluid was removed. A fluid sample wassent for laboratory analysis. IMPRESSION: Successful ultrasound guided left thoracentesis yielding 35 mL of pleural fluid. Multiple septations are now noted within the effusion consistent with multiloculated effusion. Electronically Signed   By: Lupita Raider, M.D.   On: 09/07/2015 12:23     ASSESSMENT / PLAN:  PULMONARY A: Prolonged VDRF - initially intubated for AMS. HAP with parapneumonic effusion P:   Cont full vent support - settings reviewed and/or adjusted Cont vent bundle Daily SBT if/when meets criteria Chest tube placed 11/21 - daily CXR. Might require VATS  CARDIOVASCULAR A:  NO acute issues P:  Monitor  RENAL A:   AKI, nonoliguric CKD P:   Monitor BMET intermittently Monitor I/Os Correct electrolytes as indicated Renal service following  GASTROINTESTINAL A:   No issues P:   SUP: enteral PPI Cont TFs  HEMATOLOGIC A:   Mild anemia without overt bleeding P:  DVT px: SQ heparin Monitor CBC intermittently Transfuse per usual ICU guidelines  INFECTIOUS A:   C diff,  fully treated Citrobacter HAP Complicated parapneumonic effusion Severe sepsis P:   Monitor temp, WBC count Micro and abx as above ID service following  ENDOCRINE A:   No issues P:   Monitor glu on chem panels Consider SSI for glu > 180  NEUROLOGIC A:   Schizophrenia ? Of seizures - Neuro has seen. Recs no ACDs presently H/O EtOH abuse P:   RASS goal: -1, -2 PAD protocol     CCM time: 60 mins The above time includes  time spent in consultation with patient and/or family members and reviewing care plan on multidisciplinary rounds  Billy Fischer, MD PCCM service Mobile [phone] Pager [phone]    09/07/2015, 1:40 PM

## 2015-09-07 NOTE — Progress Notes (Signed)
Nutrition Follow-up    INTERVENTION:  EN: Discussed in ICU rounds this am and TF to be resumed following thoracentesis today. Recommend Vital High Protein increased to 3950ml/hr with prostat BID for 1400 kcals, 135 g of protein to better meet needs as coming off diprivan.    NUTRITION DIAGNOSIS:   Inadequate oral intake related to acute illness as evidenced by NPO status.    GOAL:   Provide needs based on ASPEN/SCCM guidelines    MONITOR:    (Energy intake, Digestive system, Electrolyte and renal profile)  REASON FOR ASSESSMENT:   Consult Enteral/tube feeding initiation and management  ASSESSMENT:      Planning thoracentesis today. Transitioning pt off diprivan and starting precedex and fentanyl. HD cath removed yesterday C-diff positive    Current Nutrition: TF currently off at this time.  Free water increased to 200ml q 4hr per MD order to correct elevated Na   Gastrointestinal Profile: Last BM: loose stool, rectal tube in place, c-diff positive  UOP: 2495ml last 24 hr  Scheduled Medications:  . antiseptic oral rinse  7 mL Mouth Rinse 10 times per day  . chlorhexidine gluconate  15 mL Mouth Rinse BID  . feeding supplement (PRO-STAT SUGAR FREE 64)  30 mL Oral 6 times per day  . feeding supplement (VITAL HIGH PROTEIN)  1,000 mL Per Tube Q24H  . free water  200 mL Per Tube Q4H  . heparin subcutaneous  5,000 Units Subcutaneous 3 times per day  . ipratropium-albuterol  3 mL Nebulization Q6H  . levofloxacin (LEVAQUIN) IV  750 mg Intravenous Q48H  . pantoprazole sodium  40 mg Per Tube Q1200    Continuous Medications:  . dexmedetomidine 1 mcg/kg/hr (09/07/15 1040)  . fentaNYL infusion INTRAVENOUS 100 mcg/hr (09/07/15 1037)     Electrolyte/Renal Profile and Glucose Profile:   Recent Labs Lab 09/05/15 0507 09/06/15 0600 09/07/15 0447  NA UNABLE TO PERFORM DUE TO LIPEMIC INTERFERENCE 144 146*  K 4.9 5.1 4.9  CL 113* 115* 116*  CO2 23 23 23   BUN 61* 59*  62*  CREATININE 4.54* 4.33* 4.19*  CALCIUM 8.1* 8.9 9.0  GLUCOSE 114* 106* 104*   Protein Profile:  Recent Labs Lab 09/05/15 0507  ALBUMIN 1.8*      Weight Trend since Admission: Filed Weights   09/05/15 0400 09/06/15 0500 09/07/15 0638  Weight: 243 lb 13.3 oz (110.6 kg) 236 lb 12.4 oz (107.4 kg) 245 lb 6 oz (111.3 kg)      Diet Order:   NPO  Skin:   reviewed  Height:   Ht Readings from Last 1 Encounters:  08/21/15 5\' 11"  (1.803 m)    Weight:   Wt Readings from Last 1 Encounters:  09/07/15 245 lb 6 oz (111.3 kg)    Ideal Body Weight:     BMI:  Body mass index is 34.24 kg/(m^2).  Estimated Nutritional Needs:   Kcal:  [id number] kcals/d (11-14 kcals/kg Actual BW 117kg)  Protein:  (2.0-2.5 g/kg) Using IBW 78 kg 156-195 g/d  Fluid:  (1000ml + UOP)  EDUCATION NEEDS:   No education needs identified at this time  HIGH Care Level  Latrish Mogel B. Freida BusmanAllen, RD, LDN [phone] (pager)

## 2015-09-07 NOTE — Procedures (Signed)
Under US guidance, thoracentesis of multiloculated left pleural effusion was performed. Only 35 mls of serosanguinous fluid obtained. Sample sent to lab.

## 2015-09-07 NOTE — Progress Notes (Signed)
Subjective:  Pt remains critically ill. Still on the vent.  Good UOP noted however. Cr slightly lower to 4.19 Getting ready for thoracentesis  Objective:  Vital signs in last 24 hours:  Temp:  [98.1 F (36.7 C)-98.6 F (37 C)] 98.2 F (36.8 C) (11/21 0800) Pulse Rate:  [72-93] 93 (11/21 0900) Resp:  [14-22] 14 (11/21 0900) BP: (93-118)/(48-81) 118/81 mmHg (11/21 0900) SpO2:  [98 %-100 %] 100 % (11/21 0900) FiO2 (%):  [35 %] 35 % (11/21 0406) Weight:  [111.3 kg (245 lb 6 oz)] 111.3 kg (245 lb 6 oz) (11/21 5784)  Weight change: 3.9 kg (8 lb 9.6 oz) Filed Weights   09/05/15 0400 09/06/15 0500 09/07/15 0638  Weight: 110.6 kg (243 lb 13.3 oz) 107.4 kg (236 lb 12.4 oz) 111.3 kg (245 lb 6 oz)    Intake/Output:    Intake/Output Summary (Last 24 hours) at 09/07/15 1019 Last data filed at 09/07/15 0900  Gross per 24 hour  Intake 1497.53 ml  Output   2420 ml  Net -922.47 ml     Physical Exam: General:  critically ill appearing  HEENT  [place]/AT ETT in place  Neck  supple  Pulm/lungs  bilateral rhonchi, vent assisted  CVS/Heart  S1S2 no rubs  Abdomen:   soft, +bowel sounds, nontender  Extremities:   + b/l LE edema  Neurologic:  on the vent, sedated.  Skin:  no acute rashes  Access: Right femoral non tunneled catheter Dr. Belia Heman 11/6       Basic Metabolic Panel:   Recent Labs Lab 09/03/15 0807 09/04/15 0925 09/05/15 0507 09/06/15 0600 09/07/15 0447  NA 144 143 UNABLE TO PERFORM DUE TO LIPEMIC INTERFERENCE 144 146*  K 5.1 4.8 4.9 5.1 4.9  CL 112* 112* 113* 115* 116*  CO2 GLUCOSE 127* 96 114* 106* 104*  BUN 68* 66* 61* 59* 62*  CREATININE 4.87* 4.92* 4.54* 4.33* 4.19*  CALCIUM 8.1* 8.1* 8.1* 8.9 9.0     CBC:  Recent Labs Lab 09/01/15 2131 09/03/15 0807 09/05/15 0507 09/06/15 0600 09/07/15 0447  WBC 18.5* 15.6* 7.4 7.7 7.1  HGB 11.2* 10.0* 8.7* 9.0* 8.7*  HCT 34.2* 31.7* 26.2* 28.0* 27.3*  MCV 87.8 90.9 91.5 90.2 90.6  PLT 173 199 204  250 247      Microbiology:  Recent Results (from the past 720 hour(s))  Blood Culture (routine x 2)     Status: None   Collection Time: 08/21/15 12:47 PM  Result Value Ref Range Status   Specimen Description BLOOD UNKNOWN  Final   Special Requests BOTTLES DRAWN AEROBIC AND ANAEROBIC  1CC  Final   Culture NO GROWTH 10 DAYS  Final   Report Status 08/31/2015 FINAL  Final  Blood Culture (routine x 2)     Status: None   Collection Time: 08/21/15 12:47 PM  Result Value Ref Range Status   Specimen Description BLOOD LEFT AC  Final   Special Requests BOTTLES DRAWN AEROBIC AND ANAEROBIC  6CC  Final   Culture NO GROWTH 10 DAYS  Final   Report Status 08/31/2015 FINAL  Final  Urine culture     Status: None   Collection Time: 08/21/15 12:47 PM  Result Value Ref Range Status   Specimen Description URINE, CATHETERIZED  Final   Special Requests Normal  Final   Culture NO GROWTH 2 DAYS  Final   Report Status 08/23/2015 FINAL  Final  MRSA PCR Screening     Status:  None   Collection Time: 08/21/15 11:47 PM  Result Value Ref Range Status   MRSA by PCR NEGATIVE NEGATIVE Final    Comment:        The GeneXpert MRSA Assay (FDA approved for NASAL specimens only), is one component of a comprehensive MRSA colonization surveillance program. It is not intended to diagnose MRSA infection nor to guide or monitor treatment for MRSA infections.   C difficile quick scan w PCR reflex     Status: Abnormal   Collection Time: 08/22/15  2:31 PM  Result Value Ref Range Status   C Diff antigen POSITIVE (A) NEGATIVE Final   C Diff toxin POSITIVE (A) NEGATIVE Final   C Diff interpretation   Final    Positive for toxigenic C. difficile, active toxin production present.    Comment: CRITICAL RESULT CALLED TO, READ BACK BY AND VERIFIED WITH: CHARLIE FLEETWOOD,RN 08/22/2015 1544 BY J RAZZAKSUAREZ,MT   Culture, bal-quantitative     Status: None   Collection Time: 08/25/15  3:15 PM  Result Value Ref Range Status    Specimen Description BRONCHIAL ALVEOLAR LAVAGE  Final   Special Requests NONE  Final   Gram Stain   Final    GOOD SPECIMEN - 80-90% WBCS FEW WBC SEEN NO ORGANISMS SEEN    Culture HOLDING FOR POSSIBLE PATHOGEN  Final   Report Status 08/27/2015 FINAL  Final  Culture, blood (routine x 2)     Status: None (Preliminary result)   Collection Time: 09/02/15 12:00 PM  Result Value Ref Range Status   Specimen Description BLOOD RIGHT GROIN  Final   Special Requests   Final    BOTTLES DRAWN AEROBIC AND ANAEROBIC  6CC AERO 10CC ANAERO   Culture NO GROWTH 4 DAYS  Final   Report Status PENDING  Incomplete  Culture, blood (routine x 2)     Status: None (Preliminary result)   Collection Time: 09/02/15 12:15 PM  Result Value Ref Range Status   Specimen Description BLOOD RIGHT GROIN  Final   Special Requests BOTTLES DRAWN AEROBIC AND ANAEROBIC  10 CC  Final   Culture NO GROWTH 4 DAYS  Final   Report Status PENDING  Incomplete  Culture, blood (routine x 2)     Status: None (Preliminary result)   Collection Time: 09/02/15  1:47 PM  Result Value Ref Range Status   Specimen Description BLOOD RIGHT HAND  Final   Special Requests BOTTLES DRAWN AEROBIC AND ANAEROBIC 4CC  Final   Culture NO GROWTH 4 DAYS  Final   Report Status PENDING  Incomplete  Culture, blood (routine x 2)     Status: None (Preliminary result)   Collection Time: 09/02/15  2:06 PM  Result Value Ref Range Status   Specimen Description BLOOD LEFT HAND  Final   Special Requests BOTTLES DRAWN AEROBIC AND ANAEROBIC 1CC  Final   Culture NO GROWTH 4 DAYS  Final   Report Status PENDING  Incomplete  MRSA PCR Screening     Status: None   Collection Time: 09/02/15 10:27 PM  Result Value Ref Range Status   MRSA by PCR NEGATIVE NEGATIVE Final    Comment:        The GeneXpert MRSA Assay (FDA approved for NASAL specimens only), is one component of a comprehensive MRSA colonization surveillance program. It is not intended to diagnose  MRSA infection nor to guide or monitor treatment for MRSA infections.   Culture, expectorated sputum-assessment     Status: None   Collection  Time: 09/03/15 12:18 AM  Result Value Ref Range Status   Specimen Description SPUTUM  Final   Special Requests Normal  Final   Sputum evaluation THIS SPECIMEN IS ACCEPTABLE FOR SPUTUM CULTURE  Final   Report Status 09/03/2015 FINAL  Final  Culture, respiratory (NON-Expectorated)     Status: None   Collection Time: 09/03/15 12:18 AM  Result Value Ref Range Status   Specimen Description SPUTUM  Final   Special Requests Normal Reflexed from [id number]  Final   Gram Stain   Final    EXCELLENT SPECIMEN - 90-100% WBCS MANY WBC SEEN MANY GRAM NEGATIVE COCCOBACILLI    Culture MODERATE GROWTH CITROBACTER KOSERI  Final   Report Status 09/05/2015 FINAL  Final   Organism ID, Bacteria CITROBACTER KOSERI  Final      Susceptibility   Citrobacter koseri - MIC*    CEFAZOLIN <=4 SENSITIVE Sensitive     CEFTRIAXONE <=1 SENSITIVE Sensitive     CIPROFLOXACIN <=0.25 SENSITIVE Sensitive     GENTAMICIN <=1 SENSITIVE Sensitive     IMIPENEM <=0.25 SENSITIVE Sensitive     TRIMETH/SULFA <=20 SENSITIVE Sensitive     PIP/TAZO Value in next row Sensitive      SENSITIVE<=4    LEVOFLOXACIN Value in next row Sensitive      SENSITIVE<=0.12    * MODERATE GROWTH CITROBACTER KOSERI  Body fluid culture     Status: None (Preliminary result)   Collection Time: 09/04/15 10:30 AM  Result Value Ref Range Status   Specimen Description CYTO PLEU  Final   Special Requests NONE  Final   Gram Stain   Final    MODERATE WBC SEEN TOO NUMEROUS TO COUNT RED BLOOD CELLS NO ORGANISMS SEEN    Culture NO GROWTH 3 DAYS  Final   Report Status PENDING  Incomplete    Coagulation Studies: No results for input(s): LABPROT, INR in the last 72 hours.  Urinalysis: No results for input(s): COLORURINE, LABSPEC, PHURINE, GLUCOSEU, HGBUR, BILIRUBINUR, KETONESUR, PROTEINUR, UROBILINOGEN, NITRITE,  LEUKOCYTESUR in the last 72 hours.  Invalid input(s): APPERANCEUR    Imaging: Dg Chest 1 View  09/07/2015  CLINICAL DATA:  Intubation. EXAM: CHEST 1 VIEW COMPARISON:  09/06/2015. FINDINGS: Endotracheal tube, NG tube, left PICC line in stable position. Heart size stable. Persistent consolidation of the left lung consistent with atelectasis and/or pneumonia. Persistent left pleural effusion. No interim change. IMPRESSION: 1. Lines and tubes in stable position. 2. Persistent consolidation of the left lung consistent atelectasis and/or pneumonia. Persistent left pleural effusion. No interim change . Electronically Signed   By: Maisie Fus  Register   On: 09/07/2015 07:12   Dg Chest 1 View  09/06/2015  CLINICAL DATA:  Shortness of breath for a prior left thoracentesis. EXAM: CHEST 1 VIEW COMPARISON:  Chest radiograph 09/04/2015. FINDINGS: ET tube terminates in the mid trachea. Enteric tube courses inferior to the diaphragm. Multiple monitoring leads overlie the patient. Stable cardiac and mediastinal contours. Minimal atelectasis right base. Large layering left pleural effusion with minimal aeration of the left upper lung, grossly stable when compared to prior. No definite pneumothorax. IMPRESSION: Persistent large layering left pleural effusion with minimal aeration of the left upper lung. Consolidative opacities within the remainder of left lung likely secondary to underlying atelectasis. Stable support apparatus. Electronically Signed   By: Annia Belt M.D.   On: 09/06/2015 09:25     Medications:   . dexmedetomidine 0.5 mcg/kg/hr (09/07/15 0955)  . fentaNYL infusion INTRAVENOUS 200 mcg/hr (09/07/15 0959)   .  antiseptic oral rinse  7 mL Mouth Rinse 10 times per day  . chlorhexidine gluconate  15 mL Mouth Rinse BID  . feeding supplement (PRO-STAT SUGAR FREE 64)  30 mL Oral 6 times per day  . feeding supplement (VITAL HIGH PROTEIN)  1,000 mL Per Tube Q24H  . free water  30 mL Per Tube Q4H  . heparin  subcutaneous  5,000 Units Subcutaneous 3 times per day  . ipratropium-albuterol  3 mL Nebulization Q6H  . levofloxacin (LEVAQUIN) IV  750 mg Intravenous Q48H  . pantoprazole sodium  40 mg Per Tube Q1200   acetaminophen **OR** acetaminophen, hydrALAZINE, Influenza vac split quadrivalent PF, ondansetron (ZOFRAN) IV  Assessment/ Plan:  49 y.o. male With alcohol abuse, schizophrenia, seizure disorder, coronary disease/MI 2012 was admitted to [facility] on November 4 after being found unresponsive in his apartment. Urine toxicology screen is positive for benzodiazepines and cannabinoids. Stool positive for C. Difficile  1. Acute renal failure, likely secondary to ATN from concurrent illness, volume depletion, C. difficile, rhabdomyolysis and sepsis. Has had 4 HD treatments. -Good UOP noted  - Dialysis catheter removed  2. Acute respiratory failure due to bacterial pneumonia/Left pleural effusion s/p thoracentesis 11/18. -Remains on the vent at this point in time, currently on levofloxacin.   3.  Hypernatremia. Na 146 today,  - Free water supplementation  4.  Hypokalemia. Resolved.  5. Infective Colitis A09: c. Diff positive.  - treated with PO vancomycin previously.  6.  Acute rhabdomyolysis M62.82: Contributed to acute renal failure, resolved with conservative management.   LOS: 17 Ross [name] 11/21/201610:19 AM

## 2015-09-07 NOTE — Progress Notes (Signed)
ANTIBIOTIC CONSULT NOTE - follow up  Pharmacy Consult for Levaquin Indication: HCAP  Allergies  Allergen Reactions  . Antihistamines, Diphenhydramine-Type Anaphylaxis and Rash  . Benadryl [Diphenhydramine Hcl] Anaphylaxis and Rash  . Hydroxyzine Anaphylaxis and Rash    Patient Measurements: Height: 5\' 11"  (180.3 cm) Weight: 245 lb 6 oz (111.3 kg) IBW/kg (Calculated) : 75.3 Adjusted Body Weight: na  Vital Signs: Temp: 98.2 F (36.8 C) (11/21 0800) Temp Source: Axillary (11/21 0800) BP: 101/64 mmHg (11/21 1100) Pulse Rate: 93 (11/21 1100) Intake/Output from previous day: 11/20 0701 - 11/21 0700 In: 1477.5 [I.V.:977.5; NG/GT:500] Out: 2695 [Urine:2495; Stool:200] Intake/Output from this shift: Total I/O In: 20 [NG/GT:20] Out: 175 [Urine:175]  Labs:  Recent Labs  09/05/15 0507 09/06/15 0600 09/07/15 0447  WBC 7.4 7.7 7.1  HGB 8.7* 9.0* 8.7*  PLT 204 250 247  CREATININE 4.54* 4.33* 4.19*   Estimated Creatinine Clearance: 27.7 mL/min (by C-G formula based on Cr of 4.19).   Microbiology: Recent Results (from the past 720 hour(s))  Blood Culture (routine x 2)     Status: None   Collection Time: 08/21/15 12:47 PM  Result Value Ref Range Status   Specimen Description BLOOD UNKNOWN  Final   Special Requests BOTTLES DRAWN AEROBIC AND ANAEROBIC  1CC  Final   Culture NO GROWTH 10 DAYS  Final   Report Status 08/31/2015 FINAL  Final  Blood Culture (routine x 2)     Status: None   Collection Time: 08/21/15 12:47 PM  Result Value Ref Range Status   Specimen Description BLOOD LEFT AC  Final   Special Requests BOTTLES DRAWN AEROBIC AND ANAEROBIC  6CC  Final   Culture NO GROWTH 10 DAYS  Final   Report Status 08/31/2015 FINAL  Final  Urine culture     Status: None   Collection Time: 08/21/15 12:47 PM  Result Value Ref Range Status   Specimen Description URINE, CATHETERIZED  Final   Special Requests Normal  Final   Culture NO GROWTH 2 DAYS  Final   Report Status  08/23/2015 FINAL  Final  MRSA PCR Screening     Status: None   Collection Time: 08/21/15 11:47 PM  Result Value Ref Range Status   MRSA by PCR NEGATIVE NEGATIVE Final    Comment:        The GeneXpert MRSA Assay (FDA approved for NASAL specimens only), is one component of a comprehensive MRSA colonization surveillance program. It is not intended to diagnose MRSA infection nor to guide or monitor treatment for MRSA infections.   C difficile quick scan w PCR reflex     Status: Abnormal   Collection Time: 08/22/15  2:31 PM  Result Value Ref Range Status   C Diff antigen POSITIVE (A) NEGATIVE Final   C Diff toxin POSITIVE (A) NEGATIVE Final   C Diff interpretation   Final    Positive for toxigenic C. difficile, active toxin production present.    Comment: CRITICAL RESULT CALLED TO, READ BACK BY AND VERIFIED WITH: CHARLIE FLEETWOOD,RN 08/22/2015 1544 BY J RAZZAKSUAREZ,MT   Culture, bal-quantitative     Status: None   Collection Time: 08/25/15  3:15 PM  Result Value Ref Range Status   Specimen Description BRONCHIAL ALVEOLAR LAVAGE  Final   Special Requests NONE  Final   Gram Stain   Final    GOOD SPECIMEN - 80-90% WBCS FEW WBC SEEN NO ORGANISMS SEEN    Culture HOLDING FOR POSSIBLE PATHOGEN  Final   Report Status 08/27/2015  FINAL  Final  Culture, blood (routine x 2)     Status: None (Preliminary result)   Collection Time: 09/02/15 12:00 PM  Result Value Ref Range Status   Specimen Description BLOOD RIGHT GROIN  Final   Special Requests   Final    BOTTLES DRAWN AEROBIC AND ANAEROBIC  6CC AERO 10CC ANAERO   Culture NO GROWTH 4 DAYS  Final   Report Status PENDING  Incomplete  Culture, blood (routine x 2)     Status: None (Preliminary result)   Collection Time: 09/02/15 12:15 PM  Result Value Ref Range Status   Specimen Description BLOOD RIGHT GROIN  Final   Special Requests BOTTLES DRAWN AEROBIC AND ANAEROBIC  10 CC  Final   Culture NO GROWTH 4 DAYS  Final   Report Status  PENDING  Incomplete  Culture, blood (routine x 2)     Status: None (Preliminary result)   Collection Time: 09/02/15  1:47 PM  Result Value Ref Range Status   Specimen Description BLOOD RIGHT HAND  Final   Special Requests BOTTLES DRAWN AEROBIC AND ANAEROBIC 4CC  Final   Culture NO GROWTH 4 DAYS  Final   Report Status PENDING  Incomplete  Culture, blood (routine x 2)     Status: None (Preliminary result)   Collection Time: 09/02/15  2:06 PM  Result Value Ref Range Status   Specimen Description BLOOD LEFT HAND  Final   Special Requests BOTTLES DRAWN AEROBIC AND ANAEROBIC 1CC  Final   Culture NO GROWTH 4 DAYS  Final   Report Status PENDING  Incomplete  MRSA PCR Screening     Status: None   Collection Time: 09/02/15 10:27 PM  Result Value Ref Range Status   MRSA by PCR NEGATIVE NEGATIVE Final    Comment:        The GeneXpert MRSA Assay (FDA approved for NASAL specimens only), is one component of a comprehensive MRSA colonization surveillance program. It is not intended to diagnose MRSA infection nor to guide or monitor treatment for MRSA infections.   Culture, expectorated sputum-assessment     Status: None   Collection Time: 09/03/15 12:18 AM  Result Value Ref Range Status   Specimen Description SPUTUM  Final   Special Requests Normal  Final   Sputum evaluation THIS SPECIMEN IS ACCEPTABLE FOR SPUTUM CULTURE  Final   Report Status 09/03/2015 FINAL  Final  Culture, respiratory (NON-Expectorated)     Status: None   Collection Time: 09/03/15 12:18 AM  Result Value Ref Range Status   Specimen Description SPUTUM  Final   Special Requests Normal Reflexed from [id number]  Final   Gram Stain   Final    EXCELLENT SPECIMEN - 90-100% WBCS MANY WBC SEEN MANY GRAM NEGATIVE COCCOBACILLI    Culture MODERATE GROWTH CITROBACTER KOSERI  Final   Report Status 09/05/2015 FINAL  Final   Organism ID, Bacteria CITROBACTER KOSERI  Final      Susceptibility   Citrobacter koseri - MIC*    CEFAZOLIN  <=4 SENSITIVE Sensitive     CEFTRIAXONE <=1 SENSITIVE Sensitive     CIPROFLOXACIN <=0.25 SENSITIVE Sensitive     GENTAMICIN <=1 SENSITIVE Sensitive     IMIPENEM <=0.25 SENSITIVE Sensitive     TRIMETH/SULFA <=20 SENSITIVE Sensitive     PIP/TAZO Value in next row Sensitive      SENSITIVE<=4    LEVOFLOXACIN Value in next row Sensitive      SENSITIVE<=0.12    * MODERATE GROWTH CITROBACTER KOSERI  Body  fluid culture     Status: None (Preliminary result)   Collection Time: 09/04/15 10:30 AM  Result Value Ref Range Status   Specimen Description CYTO PLEU  Final   Special Requests NONE  Final   Gram Stain   Final    MODERATE WBC SEEN TOO NUMEROUS TO COUNT RED BLOOD CELLS NO ORGANISMS SEEN    Culture NO GROWTH 3 DAYS  Final   Report Status PENDING  Incomplete    Medical History: Past Medical History  Diagnosis Date  . Sleep apnea     had surgery to correct it  . Mental disorder     PTSD  . Seizures (HCC)     currently dx with seizures  . Cardiac arrest (HCC) 08/23/2011  . Pneumonia 09/04/2011  . Pancreatitis   . SOB (shortness of breath) 09/11/2011  . Hypertension   . Anxiety   . Myocardial infarction (HCC)   . Schizophrenia (HCC)   . Rhabdomyolysis 08/23/2015  . Acute renal failure (HCC) 08/23/2015    Medications:  Anti-infectives    Start     Dose/Rate Route Frequency Ordered Stop   09/05/15 1800  levofloxacin (LEVAQUIN) IVPB 750 mg     750 mg 100 mL/hr over 90 Minutes Intravenous Every 48 hours 09/05/15 1544     09/04/15 1200  vancomycin (VANCOCIN) IVPB 1000 mg/200 mL premix  Status:  Discontinued     1,000 mg 200 mL/hr over 60 Minutes Intravenous Every M-W-F (Hemodialysis) 09/02/15 1418 09/04/15 1059   09/03/15 2000  anidulafungin (ERAXIS) 100 mg in sodium chloride 0.9 % 100 mL IVPB  Status:  Discontinued     100 mg over 90 Minutes Intravenous Every 24 hours 09/02/15 1620 09/07/15 1013   09/02/15 2000  anidulafungin (ERAXIS) 200 mg in sodium chloride 0.9 % 200 mL  IVPB     200 mg over 180 Minutes Intravenous  Once 09/02/15 1812 09/02/15 2307   09/02/15 1630  anidulafungin (ERAXIS) 200 mg in sodium chloride 0.9 % 200 mL IVPB  Status:  Discontinued     200 mg over 180 Minutes Intravenous  Once 09/02/15 1620 09/02/15 2132   09/02/15 1600  piperacillin-tazobactam (ZOSYN) IVPB 3.375 g  Status:  Discontinued     3.375 g 12.5 mL/hr over 240 Minutes Intravenous Every 12 hours 09/02/15 1053 09/02/15 1307   09/02/15 1430  vancomycin (VANCOCIN) 2,000 mg in sodium chloride 0.9 % 500 mL IVPB     2,000 mg 250 mL/hr over 120 Minutes Intravenous  Once 09/02/15 1416 09/02/15 1820   09/02/15 1315  piperacillin-tazobactam (ZOSYN) IVPB 3.375 g  Status:  Discontinued     3.375 g 12.5 mL/hr over 240 Minutes Intravenous Every 12 hours 09/02/15 1307 09/05/15 1447   08/29/15 1700  vancomycin (VANCOCIN) IVPB 1000 mg/200 mL premix  Status:  Discontinued     1,000 mg 200 mL/hr over 60 Minutes Intravenous every 72 hours 08/26/15 1555 08/27/15 1023   08/27/15 1000  meropenem (MERREM) 1 g in sodium chloride 0.9 % 100 mL IVPB  Status:  Discontinued     1 g 200 mL/hr over 30 Minutes Intravenous Every 24 hours 08/26/15 1555 08/27/15 1023   08/26/15 1800  vancomycin (VANCOCIN) 500 mg in sodium chloride irrigation 0.9 % 100 mL ENEMA  Status:  Discontinued     500 mg Rectal 4 times per day 08/26/15 1531 08/27/15 1023   08/26/15 1700  vancomycin (VANCOCIN) IVPB 1000 mg/200 mL premix     1,000 mg 200 mL/hr over  60 Minutes Intravenous  Once 08/26/15 1555 08/26/15 1723   08/25/15 1600  meropenem (MERREM) 1 g in sodium chloride 0.9 % 100 mL IVPB  Status:  Discontinued     1 g 200 mL/hr over 30 Minutes Intravenous Every 12 hours 08/25/15 1545 08/26/15 1555   08/25/15 1600  vancomycin (VANCOCIN) IVPB 1000 mg/200 mL premix  Status:  Discontinued     1,000 mg 200 mL/hr over 60 Minutes Intravenous Every 24 hours 08/25/15 1545 08/26/15 1555   08/24/15 1200  vancomycin (VANCOCIN) 50 mg/mL  oral solution 125 mg  Status:  Discontinued     125 mg Oral 4 times per day 08/24/15 1035 09/05/15 1447   08/22/15 2000  vancomycin (VANCOCIN) IVPB 1000 mg/200 mL premix  Status:  Discontinued     1,000 mg 200 mL/hr over 60 Minutes Intravenous Every 24 hours 08/21/15 1439 08/22/15 0956   08/22/15 1800  vancomycin (VANCOCIN) 50 mg/mL oral solution 500 mg  Status:  Discontinued     500 mg Oral 4 times per day 08/22/15 1547 08/24/15 1035   08/22/15 1600  vancomycin (VANCOCIN) IVPB 1000 mg/200 mL premix  Status:  Discontinued     1,000 mg 200 mL/hr over 60 Minutes Intravenous Every 18 hours 08/22/15 0956 08/22/15 1001   08/22/15 1600  vancomycin (VANCOCIN) 1,250 mg in sodium chloride 0.9 % 250 mL IVPB  Status:  Discontinued     1,250 mg 166.7 mL/hr over 90 Minutes Intravenous Every 18 hours 08/22/15 1001 08/23/15 0827   08/22/15 1200  vancomycin (VANCOCIN) 50 mg/mL oral solution 125 mg  Status:  Discontinued     125 mg Oral 4 times per day 08/22/15 1035 08/22/15 1550   08/21/15 2200  piperacillin-tazobactam (ZOSYN) IVPB 3.375 g  Status:  Discontinued     3.375 g 12.5 mL/hr over 240 Minutes Intravenous 3 times per day 08/21/15 1431 08/23/15 0827   08/21/15 2000  vancomycin (VANCOCIN) IVPB 1000 mg/200 mL premix     1,000 mg 200 mL/hr over 60 Minutes Intravenous  Once 08/21/15 1439 08/21/15 2248   08/21/15 1300  piperacillin-tazobactam (ZOSYN) IVPB 3.375 g     3.375 g 100 mL/hr over 30 Minutes Intravenous  Once 08/21/15 1245 08/21/15 1457   08/21/15 1300  vancomycin (VANCOCIN) IVPB 1000 mg/200 mL premix     1,000 mg 200 mL/hr over 60 Minutes Intravenous  Once 08/21/15 1245 08/21/15 1457     Assessment: Patient with pan sensitive citrobacter in sputum. MD changing antibiotics from Vancomycin and Zosyn to Levaquin.  Plan:  Will continue Levaquin  IV q48h for reduced renal function.  Luisa Hart, PharmD Clinical Pharmacist 09/07/2015 11:24 AM

## 2015-09-07 NOTE — Progress Notes (Signed)
Pt remains intubated with FiO2 at 35% pt intermittently follows commands when sedation decreased; left chest tube placed using sterile procedure by Dr. Sung AmabileSimonds at bedside consent obtained prior to insertion pt tolerated well; pt sedated with precedex and fentanyl drip tolerating well; tube feeds restarted post chest tube placement; adequate uop via foley; Dr. Clent RidgesWalsh notified of chest xray results showing PICC line is in the right atrium and needs to be withdrawn 3-4 cm [facility] notified currently waiting on call back; vss; adequate uop via foley; pts father and mother updated about plan of care and questions answered will continue to monitor and assess pt

## 2015-09-07 NOTE — Procedures (Signed)
Chest Tube Insertion Procedure Note  Indications:  Clinically significant Effusion  Pre-operative Diagnosis: Effusion  Post-operative Diagnosis: Effusion  Procedure Details  Informed consent was obtained for the procedure, including sedation.  Risks of lung perforation, hemorrhage, arrhythmia, and adverse drug reaction were discussed.   After sterile skin prep, using standard technique, a 28 French tube was placed in the left lateral 7th rib space.  Findings: 1200 ml of serosanguinous fluid obtained  Estimated Blood Loss:  Minimal         Specimens:  None              Complications:  None; patient tolerated the procedure well.         Condition: stable  Attending Attestation: I performed the procedure.  Billy Fischeravid Yazmine Sorey, MD PCCM service Mobile [phone] Pager [phone]

## 2015-09-08 ENCOUNTER — Inpatient Hospital Stay: Payer: Medicare Other

## 2015-09-08 DIAGNOSIS — F209 Schizophrenia, unspecified: Secondary | ICD-10-CM

## 2015-09-08 LAB — GLUCOSE, CAPILLARY
GLUCOSE-CAPILLARY: 96 mg/dL (ref 65–99)
GLUCOSE-CAPILLARY: 112 mg/dL — AB (ref 65–99)
Glucose-Capillary: 100 mg/dL — ABNORMAL HIGH (ref 65–99)

## 2015-09-08 LAB — BODY FLUID CULTURE: Culture: NO GROWTH

## 2015-09-08 LAB — CBC
HEMATOCRIT: 29.5 % — AB (ref 40.0–52.0)
Hemoglobin: 9.4 g/dL — ABNORMAL LOW (ref 13.0–18.0)
MCH: 28.7 pg (ref 26.0–34.0)
MCHC: 32 g/dL (ref 32.0–36.0)
MCV: 89.7 fL (ref 80.0–100.0)
PLATELETS: 259 10*3/uL (ref 150–440)
RBC: 3.29 MIL/uL — AB (ref 4.40–5.90)
RDW: 14.7 % — ABNORMAL HIGH (ref 11.5–14.5)
WBC: 7.8 10*3/uL (ref 3.8–10.6)

## 2015-09-08 LAB — COMPREHENSIVE METABOLIC PANEL
ALK PHOS: 446 U/L — AB (ref 38–126)
ALT: 34 U/L (ref 17–63)
AST: 40 U/L (ref 15–41)
Albumin: 1.8 g/dL — ABNORMAL LOW (ref 3.5–5.0)
Anion gap: 7 (ref 5–15)
BILIRUBIN TOTAL: 0.5 mg/dL (ref 0.3–1.2)
BUN: 59 mg/dL — AB (ref 6–20)
CALCIUM: 9.3 mg/dL (ref 8.9–10.3)
CHLORIDE: 116 mmol/L — AB (ref 101–111)
CO2: 23 mmol/L (ref 22–32)
CREATININE: 3.82 mg/dL — AB (ref 0.61–1.24)
GFR, EST AFRICAN AMERICAN: 20 mL/min — AB (ref >60)
GFR, EST NON AFRICAN AMERICAN: 17 mL/min — AB (ref >60)
Glucose, Bld: 129 mg/dL — ABNORMAL HIGH (ref 65–99)
Potassium: 5 mmol/L (ref 3.5–5.1)
Sodium: 146 mmol/L — ABNORMAL HIGH (ref 135–145)
TOTAL PROTEIN: 6.5 g/dL (ref 6.5–8.1)

## 2015-09-08 LAB — CYTOLOGY - NON PAP

## 2015-09-08 MED ORDER — NICARDIPINE HCL IN NACL 20-0.86 MG/200ML-% IV SOLN
3.0000 mg/h | INTRAVENOUS | Status: DC
  Filled 2015-09-08: qty 200

## 2015-09-08 MED ORDER — FENTANYL CITRATE (PF) 100 MCG/2ML IJ SOLN
25.0000 ug | INTRAMUSCULAR | Status: DC | PRN
  Administered 2015-09-08 (×2): 100 ug via INTRAVENOUS
  Filled 2015-09-08 (×2): qty 2

## 2015-09-08 MED ORDER — ANTISEPTIC ORAL RINSE SOLUTION (CORINZ)
7.0000 mL | Freq: Four times a day (QID) | OROMUCOSAL | Status: DC
  Administered 2015-09-09 – 2015-09-14 (×14): 7 mL via OROMUCOSAL
  Filled 2015-09-08 (×26): qty 7

## 2015-09-08 MED ORDER — HALOPERIDOL LACTATE 5 MG/ML IJ SOLN
1.0000 mg | INTRAMUSCULAR | Status: DC | PRN
  Administered 2015-09-08 (×2): 2 mg via INTRAVENOUS
  Filled 2015-09-08: qty 1

## 2015-09-08 MED ORDER — ALTEPLASE 2 MG IJ SOLR
Freq: Once | INTRAMUSCULAR | Status: AC
  Administered 2015-09-08: 10:00:00 via INTRAPLEURAL
  Filled 2015-09-08: qty 10

## 2015-09-08 MED ORDER — DEXTROSE-NACL 5-0.45 % IV SOLN
INTRAVENOUS | Status: DC
  Administered 2015-09-08 (×2): via INTRAVENOUS

## 2015-09-08 NOTE — Progress Notes (Signed)
[facility] Date of Admission:  08/21/2015     ID: Ross [name] is a 49 y.o. male with  C diff, sepsis,   Active Problems:   Sepsis (HCC)   Acute respiratory failure (HCC)   Enteritis due to Clostridium difficile   Rhabdomyolysis   Acute renal failure (HCC)   Acute respiratory failure with hypoxia (HCC)   Altered mental status   Pleural effusion   Subjective: Extubated. Off pressors L chest tube placed yest - 1200 cc ss fluid obtained.  No fevers, wbc 7  ROS  Eleven systems are reviewed and negative except per hpi  Medications:  Antibiotics Given (last 72 hours)    Date/Time Action Medication Dose Rate   09/05/15 1724 Given   levofloxacin (LEVAQUIN) IVPB 750 mg 750 mg 100 mL/hr   09/07/15 1725 Given   levofloxacin (LEVAQUIN) IVPB 750 mg 750 mg 100 mL/hr     . antiseptic oral rinse  7 mL Mouth Rinse 10 times per day  . chlorhexidine gluconate  15 mL Mouth Rinse BID  . heparin subcutaneous  5,000 Units Subcutaneous 3 times per day  . ipratropium-albuterol  3 mL Nebulization Q6H  . levofloxacin (LEVAQUIN) IV  750 mg Intravenous Q48H    Objective: Vital signs in last 24 hours: Temp:  [99.1 F (37.3 C)] 99.1 F (37.3 C) (11/22 1200) Pulse Rate:  [64-95] 95 (11/22 1445) Resp:  [13-27] 27 (11/22 1445) BP: (115-149)/(73-91) 149/91 mmHg (11/22 1400) SpO2:  [94 %-100 %] 100 % (11/22 1445) FiO2 (%):  [35 %] 35 % (11/22 0705) Weight:  [111.5 kg (245 lb 13 oz)] 111.5 kg (245 lb 13 oz) (11/22 0500) Constitutional: intubated, sedated, hypotensive HENT: anicteric Mouth/Throat: ett in place Cardiovascular: tachy Pulmonary/Chest: mech breath sounds Abdominal: Soft. Bowel sounds are normal. He exhibits no distension. There is no tenderness.  Lymphadenopathy:  He has no cervical adenopathy.  Neurological:sedated  Skin: Skin is warm and dry. No rash noted. No erythema.  Access R groin HD cath site - line removed, no drainage  Lab  Results  Recent Labs  09/07/15 0447 09/08/15 0450  WBC 7.1 7.8  HGB 8.7* 9.4*  HCT 27.3* 29.5*  NA 146* 146*  K 4.9 5.0  CL 116* 116*  CO2 23 23  BUN 62* 59*  CREATININE 4.19* 3.82*    Microbiology: Results for orders placed or performed during the hospital encounter of 08/21/15  Blood Culture (routine x 2)     Status: None   Collection Time: 08/21/15 12:47 PM  Result Value Ref Range Status   Specimen Description BLOOD UNKNOWN  Final   Special Requests BOTTLES DRAWN AEROBIC AND ANAEROBIC  1CC  Final   Culture NO GROWTH 10 DAYS  Final   Report Status 08/31/2015 FINAL  Final  Blood Culture (routine x 2)     Status: None   Collection Time: 08/21/15 12:47 PM  Result Value Ref Range Status   Specimen Description BLOOD LEFT AC  Final   Special Requests BOTTLES DRAWN AEROBIC AND ANAEROBIC  6CC  Final   Culture NO GROWTH 10 DAYS  Final   Report Status 08/31/2015 FINAL  Final  Urine culture     Status: None   Collection Time: 08/21/15 12:47 PM  Result Value Ref Range Status   Specimen Description URINE, CATHETERIZED  Final   Special Requests Normal  Final   Culture NO GROWTH 2 DAYS  Final   Report Status 08/23/2015 FINAL  Final  MRSA PCR Screening     Status: None   Collection Time: 08/21/15 11:47 PM  Result Value Ref Range Status   MRSA by PCR NEGATIVE NEGATIVE Final    Comment:        The GeneXpert MRSA Assay (FDA approved for NASAL specimens only), is one component of a comprehensive MRSA colonization surveillance program. It is not intended to diagnose MRSA infection nor to guide or monitor treatment for MRSA infections.   C difficile quick scan w PCR reflex     Status: Abnormal   Collection Time: 08/22/15  2:31 PM  Result Value Ref Range Status   C Diff antigen POSITIVE (A) NEGATIVE Final   C Diff toxin POSITIVE (A) NEGATIVE Final   C Diff interpretation   Final    Positive for toxigenic C. difficile, active toxin production present.    Comment: CRITICAL  RESULT CALLED TO, READ BACK BY AND VERIFIED WITH: CHARLIE FLEETWOOD,RN 08/22/2015 1544 BY J RAZZAKSUAREZ,MT   Culture, bal-quantitative     Status: None   Collection Time: 08/25/15  3:15 PM  Result Value Ref Range Status   Specimen Description BRONCHIAL ALVEOLAR LAVAGE  Final   Special Requests NONE  Final   Gram Stain   Final    GOOD SPECIMEN - 80-90% WBCS FEW WBC SEEN NO ORGANISMS SEEN    Culture HOLDING FOR POSSIBLE PATHOGEN  Final   Report Status 08/27/2015 FINAL  Final  Culture, blood (routine x 2)     Status: None (Preliminary result)   Collection Time: 09/02/15 12:00 PM  Result Value Ref Range Status   Specimen Description BLOOD RIGHT GROIN  Final   Special Requests   Final    BOTTLES DRAWN AEROBIC AND ANAEROBIC  6CC AERO 10CC ANAERO   Culture NO GROWTH 4 DAYS  Final   Report Status PENDING  Incomplete  Culture, blood (routine x 2)     Status: None (Preliminary result)   Collection Time: 09/02/15 12:15 PM  Result Value Ref Range Status   Specimen Description BLOOD RIGHT GROIN  Final   Special Requests BOTTLES DRAWN AEROBIC AND ANAEROBIC  10 CC  Final   Culture NO GROWTH 4 DAYS  Final   Report Status PENDING  Incomplete  Culture, blood (routine x 2)     Status: None (Preliminary result)   Collection Time: 09/02/15  1:47 PM  Result Value Ref Range Status   Specimen Description BLOOD RIGHT HAND  Final   Special Requests BOTTLES DRAWN AEROBIC AND ANAEROBIC 4CC  Final   Culture NO GROWTH 4 DAYS  Final   Report Status PENDING  Incomplete  Culture, blood (routine x 2)     Status: None (Preliminary result)   Collection Time: 09/02/15  2:06 PM  Result Value Ref Range Status   Specimen Description BLOOD LEFT HAND  Final   Special Requests BOTTLES DRAWN AEROBIC AND ANAEROBIC 1CC  Final   Culture NO GROWTH 4 DAYS  Final   Report Status PENDING  Incomplete  MRSA PCR Screening     Status: None   Collection Time: 09/02/15 10:27 PM  Result Value Ref Range Status   MRSA by PCR  NEGATIVE NEGATIVE Final    Comment:        The GeneXpert MRSA Assay (FDA approved for NASAL specimens only), is one component of a comprehensive MRSA colonization surveillance program. It is not intended to diagnose MRSA infection nor to guide or monitor treatment for MRSA infections.   Culture, expectorated sputum-assessment  Status: None   Collection Time: 09/03/15 12:18 AM  Result Value Ref Range Status   Specimen Description SPUTUM  Final   Special Requests Normal  Final   Sputum evaluation THIS SPECIMEN IS ACCEPTABLE FOR SPUTUM CULTURE  Final   Report Status 09/03/2015 FINAL  Final  Culture, respiratory (NON-Expectorated)     Status: None   Collection Time: 09/03/15 12:18 AM  Result Value Ref Range Status   Specimen Description SPUTUM  Final   Special Requests Normal Reflexed from [id number]  Final   Gram Stain   Final    EXCELLENT SPECIMEN - 90-100% WBCS MANY WBC SEEN MANY GRAM NEGATIVE COCCOBACILLI    Culture MODERATE GROWTH CITROBACTER KOSERI  Final   Report Status 09/05/2015 FINAL  Final   Organism ID, Bacteria CITROBACTER KOSERI  Final      Susceptibility   Citrobacter koseri - MIC*    CEFAZOLIN <=4 SENSITIVE Sensitive     CEFTRIAXONE <=1 SENSITIVE Sensitive     CIPROFLOXACIN <=0.25 SENSITIVE Sensitive     GENTAMICIN <=1 SENSITIVE Sensitive     IMIPENEM <=0.25 SENSITIVE Sensitive     TRIMETH/SULFA <=20 SENSITIVE Sensitive     PIP/TAZO Value in next row Sensitive      SENSITIVE<=4    LEVOFLOXACIN Value in next row Sensitive      SENSITIVE<=0.12    * MODERATE GROWTH CITROBACTER KOSERI  Body fluid culture     Status: None   Collection Time: 09/04/15 10:30 AM  Result Value Ref Range Status   Specimen Description CYTO PLEU  Final   Special Requests NONE  Final   Gram Stain   Final    MODERATE WBC SEEN TOO NUMEROUS TO COUNT RED BLOOD CELLS NO ORGANISMS SEEN    Culture No growth aerobically or anaerobically.  Final   Report Status 09/08/2015 FINAL  Final   Body fluid culture     Status: None (Preliminary result)   Collection Time: 09/07/15 10:55 AM  Result Value Ref Range Status   Specimen Description CYTO PLEU  Final   Special Requests NONE  Final   Gram Stain PENDING  Incomplete   Culture NO GROWTH < 24 HOURS  Final   Report Status PENDING  Incomplete    Studies/Results: Dg Chest 1 View  09/07/2015  CLINICAL DATA:  Intubation. EXAM: CHEST 1 VIEW COMPARISON:  09/06/2015. FINDINGS: Endotracheal tube, NG tube, left PICC line in stable position. Heart size stable. Persistent consolidation of the left lung consistent with atelectasis and/or pneumonia. Persistent left pleural effusion. No interim change. IMPRESSION: 1. Lines and tubes in stable position. 2. Persistent consolidation of the left lung consistent atelectasis and/or pneumonia. Persistent left pleural effusion. No interim change . Electronically Signed   By: Maisie Fus  Register   On: 09/07/2015 07:12   Ct Chest Wo Contrast  09/07/2015  CLINICAL DATA:  Pleural effusion EXAM: CT CHEST WITHOUT CONTRAST TECHNIQUE: Multidetector CT imaging of the chest was performed following the standard protocol without IV contrast. COMPARISON:  None. FINDINGS: Large left pleural effusion is associated with compressive atelectasis of a large portion of the left lung. The left lower lobe mainstem bronchus has an abnormal appearance with narrowing and possible compression. Endobronchial lesion is not excluded. Tiny right pleural effusion and minimal right basilar atelectasis. Endotracheal tube and NG tube are in place. Left upper extremity PICC in place with its tip at the cavoatrial junction. Minimal coronary artery calcifications in the LAD territory. No abnormal mediastinal adenopathy.  No pericardial effusion. IMPRESSION: Large  left pleural effusion is associated with compressive atelectasis of much of the left lung. Endobronchial lesion in the left lower lobe bronchus is not excluded. Bronchoscopy may be  helpful. Tiny right pleural effusion and minimal right basilar atelectasis. Electronically Signed   By: Jolaine Click M.D.   On: 09/07/2015 13:37   Dg Chest Port 1 View  09/08/2015  CLINICAL DATA:  Hypoxia/respiratory distress EXAM: PORTABLE CHEST 1 VIEW COMPARISON:  September 07, 2015 FINDINGS: Endotracheal tube tip is 4.2 cm above the carina. Central catheter tip is in the superior vena cava near the cavoatrial junction. There is a chest tube on the left. Nasogastric tube tip and side port are below the diaphragm. No pneumothorax. There is loculated effusion on the left with left lower lobe airspace consolidation. Right lung is clear. Heart is upper normal in size with pulmonary vascularity within normal limits. No adenopathy. IMPRESSION: Tube and catheter positions as described without pneumothorax. Persistent effusion on the left with left lower lobe consolidation. No new opacity. Right lung clear. No change in cardiac silhouette. Electronically Signed   By: Bretta Bang III M.D.   On: 09/08/2015 07:25   Dg Chest Port 1 View  09/07/2015  CLINICAL DATA:  Chest tube placement. EXAM: PORTABLE CHEST 1 VIEW COMPARISON:  Same day. FINDINGS: Stable cardiomediastinal silhouette. Endotracheal and nasogastric tubes are unchanged in position. Left-sided PICC line is unchanged in position with distal tip in expected position of right atrium. No pneumothorax is noted. Right lung is clear. Interval placement of left-sided chest tube with tip in left lung apex. Stable moderate left pleural effusion is noted with probable left lower lobe atelectasis. Bony thorax is unremarkable. IMPRESSION: Interval placement of left-sided chest tube with continued presence of moderate left pleural effusion with probable left lower lobe atelectasis. Stable support apparatus. Left-sided PICC line tip is again noted in right atrium which is unchanged compared to prior exam ; withdrawal by 3-4 cm is recommended. These results will be  called to the ordering clinician or representative by the Radiologist Assistant, and communication documented in the PACS or zVision Dashboard. Electronically Signed   By: Lupita Raider, M.D.   On: 09/07/2015 15:39   US Thoracentesis Asp Pleural Space W/img Guide  09/07/2015  CLINICAL DATA:  Left pleural effusion. EXAM: ULTRASOUND GUIDED left THORACENTESIS COMPARISON:  September 04, 2015 PROCEDURE: Informed consent was obtained from the patient's family. Ultrasound was performed to localize and mark an adequate pocket of fluid in the left chest. It should be noted that multiple septations are noted within the effusion currently, consistent with multiloculated effusion. The area was then prepped and draped in the normal sterile fashion. 1% Lidocaine was used for local anesthesia. Under ultrasound guidance a Safe-T-Centesis catheter was introduced. Thoracentesis was performed. The catheter was removed and a dressing applied. COMPLICATIONS: None. FINDINGS: A total of approximately 35 mL of serosanguineous fluid was removed. A fluid sample wassent for laboratory analysis. IMPRESSION: Successful ultrasound guided left thoracentesis yielding 35 mL of pleural fluid. Multiple septations are now noted within the effusion consistent with multiloculated effusion. Electronically Signed   By: Lupita Raider, M.D.   On: 09/07/2015 12:23    Assessment/Plan: Ross [name] is a 49 y.o. male with prolonged admission after being found unresponsive, with ARF, rhabdo, sepsis, prolonged intubated, now with repeat sepsis in setting of HD with possible recurrent PNA and two central lines in place. Also has C diff. HIV is negative. BCX ngtd.  Sputum cx with citrobacter.  Ct Shows reaccumulation of L pleurl fluid. R HD cath removed. L neck line removed.  Extubated and Chest tube placed 11.21  ABX  Anidulafungin 11/16->11/21 Levo 11/19->  Zosyn 11/16-11/18  Recommendations Continue levofloxacin for 10 day total course  Stop date 11/26. Follow cultures from pleural fluid Thank you very much for the consult. Will follow with you.  Shelagh Rayman   09/08/2015, 3:43 PM

## 2015-09-08 NOTE — Progress Notes (Signed)
Chest tube clamp removed

## 2015-09-08 NOTE — Progress Notes (Signed)
Extubated to 2lnc without complications 

## 2015-09-08 NOTE — Progress Notes (Signed)
[facility] [place] [facility]   PATIENT NAME: Ross Manor[place] [name]    MR#:  [id number]  DATE OF BIRTH:  01-21-1966    CHIEF COMPLAINT:   Chief Complaint  Patient presents with  . Seizures   Extubated. Feeling weak.  REVIEW OF SYSTEMS:   ROS unable to obtain as patient is not able to speak  DRUG ALLERGIES:   Allergies  Allergen Reactions  . Antihistamines, Diphenhydramine-Type Anaphylaxis and Rash  . Benadryl [Diphenhydramine Hcl] Anaphylaxis and Rash  . Hydroxyzine Anaphylaxis and Rash    VITALS:  Blood pressure 172/101, pulse 108, temperature 99.2 F (37.3 C), temperature source Axillary, resp. rate 32, height 5\' 11"  (1.803 m), weight 111.5 kg (245 lb 13 oz), SpO2 98 %.  PHYSICAL EXAMINATION:  GENERAL:  49 y.o.-year-old patient recently extubated, weak, short shallow breaths HEENT: Pupils equal round and reactive, oral mucous membranes are moist, orally intubated Pulmonary: Short shallow breaths, good air movement, upper airway congestion, no wheezing  CARDIOVASCULAR: S1, S2 normal. No murmurs, rubs, or gallops. ABDOMEN: Soft, nontender, nondistended. Bowel sounds present. No organomegaly or mass.  EXTREMITIES: +1 edema bilaterally edema. No cyanosis, or clubbing. Peripheral pulses 2+ NEUROLOGIC: Cranial nerves grossly intact, moving all 4 extremities, responds to simple commands, SKIN: No obvious rash, lesion, or ulcer.  Psych: Lethargic   LABORATORY PANEL:   CBC  Recent Labs Lab 09/08/15 0450  WBC 7.8  HGB 9.4*  HCT 29.5*  PLT 259   ------------------------------------------------------------------------------------------------------------------  Chemistries   Recent Labs Lab 09/08/15 0450  NA 146*  K 5.0  CL 116*  CO2 23  GLUCOSE 129*  BUN 59*  CREATININE 3.82*  CALCIUM 9.3  AST 40  ALT 34  ALKPHOS 446*  BILITOT 0.5    ------------------------------------------------------------------------------------------------------------------  Cardiac Enzymes No results for input(s): TROPONINI in the last 168 hours. ------------------------------------------------------------------------------------------------------------------  RADIOLOGY:  Dg Chest 1 View  09/07/2015  CLINICAL DATA:  Intubation. EXAM: CHEST 1 VIEW COMPARISON:  09/06/2015. FINDINGS: Endotracheal tube, NG tube, left PICC line in stable position. Heart size stable. Persistent consolidation of the left lung consistent with atelectasis and/or pneumonia. Persistent left pleural effusion. No interim change. IMPRESSION: 1. Lines and tubes in stable position. 2. Persistent consolidation of the left lung consistent atelectasis and/or pneumonia. Persistent left pleural effusion. No interim change . Electronically Signed   By: Maisie Fushomas  Register   On: 09/07/2015 07:12   Ct Chest Wo Contrast  09/07/2015  CLINICAL DATA:  Pleural effusion EXAM: CT CHEST WITHOUT CONTRAST TECHNIQUE: Multidetector CT imaging of the chest was performed following the standard protocol without IV contrast. COMPARISON:  None. FINDINGS: Large left pleural effusion is associated with compressive atelectasis of a large portion of the left lung. The left lower lobe mainstem bronchus has an abnormal appearance with narrowing and possible compression. Endobronchial lesion is not excluded. Tiny right pleural effusion and minimal right basilar atelectasis. Endotracheal tube and NG tube are in place. Left upper extremity PICC in place with its tip at the cavoatrial junction. Minimal coronary artery calcifications in the LAD territory. No abnormal mediastinal adenopathy.  No pericardial effusion. IMPRESSION: Large left pleural effusion is associated with compressive atelectasis of much of the left lung. Endobronchial lesion in the left lower lobe bronchus is not excluded. Bronchoscopy may be helpful. Tiny  right pleural effusion and minimal right basilar atelectasis. Electronically Signed   By: Jolaine ClickArthur  Hoss M.D.   On: 09/07/2015 13:37   Dg Chest Port 1 View  09/08/2015  CLINICAL  DATA:  Hypoxia/respiratory distress EXAM: PORTABLE CHEST 1 VIEW COMPARISON:  September 07, 2015 FINDINGS: Endotracheal tube tip is 4.2 cm above the carina. Central catheter tip is in the superior vena cava near the cavoatrial junction. There is a chest tube on the left. Nasogastric tube tip and side port are below the diaphragm. No pneumothorax. There is loculated effusion on the left with left lower lobe airspace consolidation. Right lung is clear. Heart is upper normal in size with pulmonary vascularity within normal limits. No adenopathy. IMPRESSION: Tube and catheter positions as described without pneumothorax. Persistent effusion on the left with left lower lobe consolidation. No new opacity. Right lung clear. No change in cardiac silhouette. Electronically Signed   By: Bretta Bang III M.D.   On: 09/08/2015 07:25   Dg Chest Port 1 View  09/07/2015  CLINICAL DATA:  Chest tube placement. EXAM: PORTABLE CHEST 1 VIEW COMPARISON:  Same day. FINDINGS: Stable cardiomediastinal silhouette. Endotracheal and nasogastric tubes are unchanged in position. Left-sided PICC line is unchanged in position with distal tip in expected position of right atrium. No pneumothorax is noted. Right lung is clear. Interval placement of left-sided chest tube with tip in left lung apex. Stable moderate left pleural effusion is noted with probable left lower lobe atelectasis. Bony thorax is unremarkable. IMPRESSION: Interval placement of left-sided chest tube with continued presence of moderate left pleural effusion with probable left lower lobe atelectasis. Stable support apparatus. Left-sided PICC line tip is again noted in right atrium which is unchanged compared to prior exam ; withdrawal by 3-4 cm is recommended. These results will be called to the  ordering clinician or representative by the Radiologist Assistant, and communication documented in the PACS or zVision Dashboard. Electronically Signed   By: Lupita Raider, M.D.   On: 09/07/2015 15:39   US Thoracentesis Asp Pleural Space W/img Guide  09/07/2015  CLINICAL DATA:  Left pleural effusion. EXAM: ULTRASOUND GUIDED left THORACENTESIS COMPARISON:  September 04, 2015 PROCEDURE: Informed consent was obtained from the patient's family. Ultrasound was performed to localize and mark an adequate pocket of fluid in the left chest. It should be noted that multiple septations are noted within the effusion currently, consistent with multiloculated effusion. The area was then prepped and draped in the normal sterile fashion. 1% Lidocaine was used for local anesthesia. Under ultrasound guidance a Safe-T-Centesis catheter was introduced. Thoracentesis was performed. The catheter was removed and a dressing applied. COMPLICATIONS: None. FINDINGS: A total of approximately 35 mL of serosanguineous fluid was removed. A fluid sample wassent for laboratory analysis. IMPRESSION: Successful ultrasound guided left thoracentesis yielding 35 mL of pleural fluid. Multiple septations are now noted within the effusion consistent with multiloculated effusion. Electronically Signed   By: Lupita Raider, M.D.   On: 09/07/2015 12:23    EKG:   Orders placed or performed during the hospital encounter of 08/21/15  . EKG 12-Lead  . EKG 12-Lead    ASSESSMENT AND PLAN:    #1 acute respiratory failure with hypoxia:  - Pulmonary CCM following, extubated today - Intubated 11/4 through 11/9 and then re-intubated 11/15 extubated 11/22 - pneumonia pleural effusion, thoracentesis 11/18 with removal of 1.8 L of fluid, exudative effusion, reaccumulation and chest tube placed today 11/21 - Sputum culture growing Citrobacter covering with Levaquin stop date 11/26 - Pleural blood culture pending  2 Septic shock secondary to  pneumonia and also possible line sepsis: See above.  - Left neck line removed, right groin hemodialysis catheter removed -  Antifungals discontinued 11/21 - Off levo fed since 11/19   #3 Rhabdomyolysis: Resolved - Possibly due to prolonged immobilization, hypotension, diarrhea, possible withdrawal seizures prior to admission.   #4 acute renal failure: secondary to ATN from one depletion, rhabdomyolysis, C. Difficile. Good urine output. No need for hemodialysis at this time. 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 Albumin very low. Continue to monitor  6 .left leg edema;dvt study is negative.  #7 ,anxiety, mental disorder with PTSD: .schizophrenia;consult psych when extubated.  8: Disposition. Care management has been discussing possible LTAC placement with the family. Currently the family is in favor of him remaining here.  CODE STATUS: Full   TOTAL CRITICAL CARE TIME TAKING CARE OF THIS PATIENT: 35 minutes.    POSSIBLE D/C  ? DAYS, DEPENDING ON CLINICAL CONDITION.   Elby Showers M.D on 09/08/2015 at 6:05 PM  Between 7am to 6pm - Pager - [phone]  After 6pm go to [email or web address] - password [email or web address] [facility]  [facility]  Office  [phone]  CC: Primary care physician; No primary care provider on file.

## 2015-09-08 NOTE — Progress Notes (Signed)
Subjective:  Pt remains critically ill. Left chest tube placed yesterday UOP improved S Cr has improved Remains on sedation  Objective:  Vital signs in last 24 hours:  Temp:  [99.1 F (37.3 C)-99.4 F (37.4 C)] 99.1 F (37.3 C) (11/22 0800) Pulse Rate:  [64-113] 78 (11/22 0800) Resp:  [13-28] 23 (11/22 0800) BP: (101-168)/(64-95) 123/83 mmHg (11/22 0800) SpO2:  [93 %-100 %] 100 % (11/22 0800) FiO2 (%):  [35 %] 35 % (11/22 0705) Weight:  [111.5 kg (245 lb 13 oz)] 111.5 kg (245 lb 13 oz) (11/22 0500)  Weight change: 0.2 kg (7.1 oz) Filed Weights   09/06/15 0500 09/07/15 0638 09/08/15 0500  Weight: 107.4 kg (236 lb 12.4 oz) 111.3 kg (245 lb 6 oz) 111.5 kg (245 lb 13 oz)    Intake/Output:    Intake/Output Summary (Last 24 hours) at 09/08/15 0840 Last data filed at 09/08/15 [id number]  Gross per 24 hour  Intake      0 ml  Output   4675 ml  Net  -4675 ml     Physical Exam: General:  critically ill appearing  HEENT  [place]/AT ETT in place  Neck  supple  Pulm/lungs  bilateral rhonchi, vent assisted  CVS/Heart  S1S2 no rubs  Abdomen:   soft, +bowel sounds, nontender  Extremities:   + b/l LE edema  Neurologic:  on the vent, sedated.  Skin:  no acute rashes       Rectal tube, foley    Basic Metabolic Panel:   Recent Labs Lab 09/04/15 0925 09/05/15 0507 09/06/15 0600 09/07/15 0447 09/08/15 0450  NA 143 UNABLE TO PERFORM DUE TO LIPEMIC INTERFERENCE 144 146* 146*  K 4.8 4.9 5.1 4.9 5.0  CL 112* 113* 115* 116* 116*  CO2 22 23 23 23 23   GLUCOSE 96 114* 106* 104* 129*  BUN 66* 61* 59* 62* 59*  CREATININE 4.92* 4.54* 4.33* 4.19* 3.82*  CALCIUM 8.1* 8.1* 8.9 9.0 9.3     CBC:  Recent Labs Lab 09/03/15 0807 09/05/15 0507 09/06/15 0600 09/07/15 0447 09/08/15 0450  WBC 15.6* 7.4 7.7 7.1 7.8  HGB 10.0* 8.7* 9.0* 8.7* 9.4*  HCT 31.7* 26.2* 28.0* 27.3* 29.5*  MCV 90.9 91.5 90.2 90.6 89.7  PLT 199 204 250 247 259      Microbiology:  Recent Results (from the  past 720 hour(s))  Blood Culture (routine x 2)     Status: None   Collection Time: 08/21/15 12:47 PM  Result Value Ref Range Status   Specimen Description BLOOD UNKNOWN  Final   Special Requests BOTTLES DRAWN AEROBIC AND ANAEROBIC  1CC  Final   Culture NO GROWTH 10 DAYS  Final   Report Status 08/31/2015 FINAL  Final  Blood Culture (routine x 2)     Status: None   Collection Time: 08/21/15 12:47 PM  Result Value Ref Range Status   Specimen Description BLOOD LEFT AC  Final   Special Requests BOTTLES DRAWN AEROBIC AND ANAEROBIC  6CC  Final   Culture NO GROWTH 10 DAYS  Final   Report Status 08/31/2015 FINAL  Final  Urine culture     Status: None   Collection Time: 08/21/15 12:47 PM  Result Value Ref Range Status   Specimen Description URINE, CATHETERIZED  Final   Special Requests Normal  Final   Culture NO GROWTH 2 DAYS  Final   Report Status 08/23/2015 FINAL  Final  MRSA PCR Screening     Status: None   Collection  Time: 08/21/15 11:47 PM  Result Value Ref Range Status   MRSA by PCR NEGATIVE NEGATIVE Final    Comment:        The GeneXpert MRSA Assay (FDA approved for NASAL specimens only), is one component of a comprehensive MRSA colonization surveillance program. It is not intended to diagnose MRSA infection nor to guide or monitor treatment for MRSA infections.   C difficile quick scan w PCR reflex     Status: Abnormal   Collection Time: 08/22/15  2:31 PM  Result Value Ref Range Status   C Diff antigen POSITIVE (A) NEGATIVE Final   C Diff toxin POSITIVE (A) NEGATIVE Final   C Diff interpretation   Final    Positive for toxigenic C. difficile, active toxin production present.    Comment: CRITICAL RESULT CALLED TO, READ BACK BY AND VERIFIED WITH: CHARLIE FLEETWOOD,RN 08/22/2015 1544 BY J RAZZAKSUAREZ,MT   Culture, bal-quantitative     Status: None   Collection Time: 08/25/15  3:15 PM  Result Value Ref Range Status   Specimen Description BRONCHIAL ALVEOLAR LAVAGE  Final    Special Requests NONE  Final   Gram Stain   Final    GOOD SPECIMEN - 80-90% WBCS FEW WBC SEEN NO ORGANISMS SEEN    Culture HOLDING FOR POSSIBLE PATHOGEN  Final   Report Status 08/27/2015 FINAL  Final  Culture, blood (routine x 2)     Status: None (Preliminary result)   Collection Time: 09/02/15 12:00 PM  Result Value Ref Range Status   Specimen Description BLOOD RIGHT GROIN  Final   Special Requests   Final    BOTTLES DRAWN AEROBIC AND ANAEROBIC  6CC AERO 10CC ANAERO   Culture NO GROWTH 4 DAYS  Final   Report Status PENDING  Incomplete  Culture, blood (routine x 2)     Status: None (Preliminary result)   Collection Time: 09/02/15 12:15 PM  Result Value Ref Range Status   Specimen Description BLOOD RIGHT GROIN  Final   Special Requests BOTTLES DRAWN AEROBIC AND ANAEROBIC  10 CC  Final   Culture NO GROWTH 4 DAYS  Final   Report Status PENDING  Incomplete  Culture, blood (routine x 2)     Status: None (Preliminary result)   Collection Time: 09/02/15  1:47 PM  Result Value Ref Range Status   Specimen Description BLOOD RIGHT HAND  Final   Special Requests BOTTLES DRAWN AEROBIC AND ANAEROBIC 4CC  Final   Culture NO GROWTH 4 DAYS  Final   Report Status PENDING  Incomplete  Culture, blood (routine x 2)     Status: None (Preliminary result)   Collection Time: 09/02/15  2:06 PM  Result Value Ref Range Status   Specimen Description BLOOD LEFT HAND  Final   Special Requests BOTTLES DRAWN AEROBIC AND ANAEROBIC 1CC  Final   Culture NO GROWTH 4 DAYS  Final   Report Status PENDING  Incomplete  MRSA PCR Screening     Status: None   Collection Time: 09/02/15 10:27 PM  Result Value Ref Range Status   MRSA by PCR NEGATIVE NEGATIVE Final    Comment:        The GeneXpert MRSA Assay (FDA approved for NASAL specimens only), is one component of a comprehensive MRSA colonization surveillance program. It is not intended to diagnose MRSA infection nor to guide or monitor treatment for MRSA  infections.   Culture, expectorated sputum-assessment     Status: None   Collection Time: 09/03/15 12:18 AM  Result Value Ref Range Status   Specimen Description SPUTUM  Final   Special Requests Normal  Final   Sputum evaluation THIS SPECIMEN IS ACCEPTABLE FOR SPUTUM CULTURE  Final   Report Status 09/03/2015 FINAL  Final  Culture, respiratory (NON-Expectorated)     Status: None   Collection Time: 09/03/15 12:18 AM  Result Value Ref Range Status   Specimen Description SPUTUM  Final   Special Requests Normal Reflexed from [id number]  Final   Gram Stain   Final    EXCELLENT SPECIMEN - 90-100% WBCS MANY WBC SEEN MANY GRAM NEGATIVE COCCOBACILLI    Culture MODERATE GROWTH CITROBACTER KOSERI  Final   Report Status 09/05/2015 FINAL  Final   Organism ID, Bacteria CITROBACTER KOSERI  Final      Susceptibility   Citrobacter koseri - MIC*    CEFAZOLIN <=4 SENSITIVE Sensitive     CEFTRIAXONE <=1 SENSITIVE Sensitive     CIPROFLOXACIN <=0.25 SENSITIVE Sensitive     GENTAMICIN <=1 SENSITIVE Sensitive     IMIPENEM <=0.25 SENSITIVE Sensitive     TRIMETH/SULFA <=20 SENSITIVE Sensitive     PIP/TAZO Value in next row Sensitive      SENSITIVE<=4    LEVOFLOXACIN Value in next row Sensitive      SENSITIVE<=0.12    * MODERATE GROWTH CITROBACTER KOSERI  Body fluid culture     Status: None (Preliminary result)   Collection Time: 09/04/15 10:30 AM  Result Value Ref Range Status   Specimen Description CYTO PLEU  Final   Special Requests NONE  Final   Gram Stain   Final    MODERATE WBC SEEN TOO NUMEROUS TO COUNT RED BLOOD CELLS NO ORGANISMS SEEN    Culture NO GROWTH 3 DAYS  Final   Report Status PENDING  Incomplete    Coagulation Studies: No results for input(s): LABPROT, INR in the last 72 hours.  Urinalysis: No results for input(s): COLORURINE, LABSPEC, PHURINE, GLUCOSEU, HGBUR, BILIRUBINUR, KETONESUR, PROTEINUR, UROBILINOGEN, NITRITE, LEUKOCYTESUR in the last 72 hours.  Invalid input(s):  APPERANCEUR    Imaging: Dg Chest 1 View  09/07/2015  CLINICAL DATA:  Intubation. EXAM: CHEST 1 VIEW COMPARISON:  09/06/2015. FINDINGS: Endotracheal tube, NG tube, left PICC line in stable position. Heart size stable. Persistent consolidation of the left lung consistent with atelectasis and/or pneumonia. Persistent left pleural effusion. No interim change. IMPRESSION: 1. Lines and tubes in stable position. 2. Persistent consolidation of the left lung consistent atelectasis and/or pneumonia. Persistent left pleural effusion. No interim change . Electronically Signed   By: Maisie Fus  Register   On: 09/07/2015 07:12   Ct Chest Wo Contrast  09/07/2015  CLINICAL DATA:  Pleural effusion EXAM: CT CHEST WITHOUT CONTRAST TECHNIQUE: Multidetector CT imaging of the chest was performed following the standard protocol without IV contrast. COMPARISON:  None. FINDINGS: Large left pleural effusion is associated with compressive atelectasis of a large portion of the left lung. The left lower lobe mainstem bronchus has an abnormal appearance with narrowing and possible compression. Endobronchial lesion is not excluded. Tiny right pleural effusion and minimal right basilar atelectasis. Endotracheal tube and NG tube are in place. Left upper extremity PICC in place with its tip at the cavoatrial junction. Minimal coronary artery calcifications in the LAD territory. No abnormal mediastinal adenopathy.  No pericardial effusion. IMPRESSION: Large left pleural effusion is associated with compressive atelectasis of much of the left lung. Endobronchial lesion in the left lower lobe bronchus is not excluded. Bronchoscopy may be helpful. Tiny right pleural  effusion and minimal right basilar atelectasis. Electronically Signed   By: Jolaine Click M.D.   On: 09/07/2015 13:37   Dg Chest Port 1 View  09/08/2015  CLINICAL DATA:  Hypoxia/respiratory distress EXAM: PORTABLE CHEST 1 VIEW COMPARISON:  September 07, 2015 FINDINGS: Endotracheal  tube tip is 4.2 cm above the carina. Central catheter tip is in the superior vena cava near the cavoatrial junction. There is a chest tube on the left. Nasogastric tube tip and side port are below the diaphragm. No pneumothorax. There is loculated effusion on the left with left lower lobe airspace consolidation. Right lung is clear. Heart is upper normal in size with pulmonary vascularity within normal limits. No adenopathy. IMPRESSION: Tube and catheter positions as described without pneumothorax. Persistent effusion on the left with left lower lobe consolidation. No new opacity. Right lung clear. No change in cardiac silhouette. Electronically Signed   By: Bretta Bang III M.D.   On: 09/08/2015 07:25   Dg Chest Port 1 View  09/07/2015  CLINICAL DATA:  Chest tube placement. EXAM: PORTABLE CHEST 1 VIEW COMPARISON:  Same day. FINDINGS: Stable cardiomediastinal silhouette. Endotracheal and nasogastric tubes are unchanged in position. Left-sided PICC line is unchanged in position with distal tip in expected position of right atrium. No pneumothorax is noted. Right lung is clear. Interval placement of left-sided chest tube with tip in left lung apex. Stable moderate left pleural effusion is noted with probable left lower lobe atelectasis. Bony thorax is unremarkable. IMPRESSION: Interval placement of left-sided chest tube with continued presence of moderate left pleural effusion with probable left lower lobe atelectasis. Stable support apparatus. Left-sided PICC line tip is again noted in right atrium which is unchanged compared to prior exam ; withdrawal by 3-4 cm is recommended. These results will be called to the ordering clinician or representative by the Radiologist Assistant, and communication documented in the PACS or zVision Dashboard. Electronically Signed   By: Lupita Raider, M.D.   On: 09/07/2015 15:39   US Thoracentesis Asp Pleural Space W/img Guide  09/07/2015  CLINICAL DATA:  Left pleural  effusion. EXAM: ULTRASOUND GUIDED left THORACENTESIS COMPARISON:  September 04, 2015 PROCEDURE: Informed consent was obtained from the patient's family. Ultrasound was performed to localize and mark an adequate pocket of fluid in the left chest. It should be noted that multiple septations are noted within the effusion currently, consistent with multiloculated effusion. The area was then prepped and draped in the normal sterile fashion. 1% Lidocaine was used for local anesthesia. Under ultrasound guidance a Safe-T-Centesis catheter was introduced. Thoracentesis was performed. The catheter was removed and a dressing applied. COMPLICATIONS: None. FINDINGS: A total of approximately 35 mL of serosanguineous fluid was removed. A fluid sample wassent for laboratory analysis. IMPRESSION: Successful ultrasound guided left thoracentesis yielding 35 mL of pleural fluid. Multiple septations are now noted within the effusion consistent with multiloculated effusion. Electronically Signed   By: Lupita Raider, M.D.   On: 09/07/2015 12:23     Medications:   . dexmedetomidine 1 mcg/kg/hr (09/08/15 0700)  . feeding supplement (VITAL HIGH PROTEIN) 1,000 mL (09/08/15 0500)  . fentaNYL infusion INTRAVENOUS 75 mcg/hr (09/08/15 0803)   . antiseptic oral rinse  7 mL Mouth Rinse 10 times per day  . chlorhexidine gluconate  15 mL Mouth Rinse BID  . feeding supplement (PRO-STAT SUGAR FREE 64)  30 mL Per Tube BID  . free water  200 mL Per Tube Q4H  . heparin subcutaneous  5,000 Units  Subcutaneous 3 times per day  . ipratropium-albuterol  3 mL Nebulization Q6H  . levofloxacin (LEVAQUIN) IV  750 mg Intravenous Q48H  . pantoprazole sodium  40 mg Per Tube Q1200   acetaminophen **OR** acetaminophen, hydrALAZINE, Influenza vac split quadrivalent PF, lidocaine (PF), ondansetron (ZOFRAN) IV  Assessment/ Plan:  49 y.o. male With alcohol abuse, schizophrenia, seizure disorder, coronary disease/MI 2012 was admitted to [facility] on  November 4 after being found unresponsive in his apartment. Urine toxicology screen is positive for benzodiazepines and cannabinoids. Stool positive for C. Difficile  1. Acute renal failure, likely secondary to ATN from concurrent illness, volume depletion, C. difficile, rhabdomyolysis and sepsis.   - Good UOP noted  - Dialysis catheter removed  2. Acute respiratory failure due to bacterial pneumonia/Left pleural effusion s/p thoracentesis 11/18. - chest tube 11/22 -Remains on the vent at this time  3.  Hypernatremia. Na 146 today,  - Free water supplementation  4.  Hypokalemia. Resolved.  5. Infective Colitis A09: c. Diff positive.  - treated with PO vancomycin previously.  6.  Acute rhabdomyolysis M62.82: Contributed to acute renal failure, resolved with conservative management.   LOS: 18 Errik Mitchelle 11/22/20168:40 AM

## 2015-09-08 NOTE — Plan of Care (Signed)
Problem: Respiratory: Goal: Ability to maintain adequate ventilation will improve Outcome: Completed/Met Date Met:  09/08/15 Pt now on Room air. O2 sats maintaining 98-100%.

## 2015-09-08 NOTE — Progress Notes (Signed)
PULMONARY / CRITICAL CARE MEDICINE   Name: Ross [name] MRN: [id number] DOB: [birth year]    ADMISSION DATE:  08/21/2015 CONSULTATION DATE:  11/05  PT PROFILE: 44 M admitted 11/04 with AMS, acute respiratory failure, possible seizures, recent C diff infection, AKI on CKD. Hospitalization c/b HAP, parapneumonic effusion, failed self-extubation X 2   MAJOR EVENTS/TEST RESULTS: 11/4 intubated 11/5 failed vent wean due to encephalopathy and acidosis 11/6 RIJ CVL, R Fem Vein VasCath 11/8 bronchoscopy with BAL 11/8 thoracentesis, 1800 cc removed.  11/9 failed self extubation 11/15 transferred back to ICU 11/15 intubated 11/18 Thoracentesis - 1800 cc serosanguinous fluid removed. Exudative chemistries. Cultures negative 11/21 thoracentesis - loculations on [place]. 35 cc serosanguinous fluid removed. 11/21 CT chest: IMPRESSION: Large left pleural effusion is associated with compressive atelectasis of much of the left lung. Endobronchial lesion in the left lower lobe bronchus is not excluded. 11/21 L chest tube placed 11/22 Extubated. tPA to L chest tube  INDWELLING DEVICES:: ETT 11/04 >> 11/09, 11/09 >> out, 11/15 >> 11/22 R IJ CVL 11/06 >> 11/16 R femoral HD cath 11/06 >> 11/16 LUE PICC 11/16 >>   MICRO DATA: Results for orders placed or performed during the hospital encounter of 08/21/15  Blood Culture (routine x 2)     Status: None   Collection Time: 08/21/15 12:47 PM  Result Value Ref Range Status   Specimen Description BLOOD UNKNOWN  Final   Special Requests BOTTLES DRAWN AEROBIC AND ANAEROBIC  1CC  Final   Culture NO GROWTH 10 DAYS  Final   Report Status 08/31/2015 FINAL  Final  Blood Culture (routine x 2)     Status: None   Collection Time: 08/21/15 12:47 PM  Result Value Ref Range Status   Specimen Description BLOOD LEFT AC  Final   Special Requests BOTTLES DRAWN AEROBIC AND ANAEROBIC  6CC  Final   Culture NO GROWTH 10 DAYS  Final   Report Status 08/31/2015 FINAL   Final  Urine culture     Status: None   Collection Time: 08/21/15 12:47 PM  Result Value Ref Range Status   Specimen Description URINE, CATHETERIZED  Final   Special Requests Normal  Final   Culture NO GROWTH 2 DAYS  Final   Report Status 08/23/2015 FINAL  Final  MRSA PCR Screening     Status: None   Collection Time: 08/21/15 11:47 PM  Result Value Ref Range Status   MRSA by PCR NEGATIVE NEGATIVE Final    Comment:        The GeneXpert MRSA Assay (FDA approved for NASAL specimens only), is one component of a comprehensive MRSA colonization surveillance program. It is not intended to diagnose MRSA infection nor to guide or monitor treatment for MRSA infections.   C difficile quick scan w PCR reflex     Status: Abnormal   Collection Time: 08/22/15  2:31 PM  Result Value Ref Range Status   C Diff antigen POSITIVE (A) NEGATIVE Final   C Diff toxin POSITIVE (A) NEGATIVE Final   C Diff interpretation   Final    Positive for toxigenic C. difficile, active toxin production present.    Comment: CRITICAL RESULT CALLED TO, READ BACK BY AND VERIFIED WITH: CHARLIE FLEETWOOD,RN 08/22/2015 1544 BY J RAZZAKSUAREZ,MT   Culture, bal-quantitative     Status: None   Collection Time: 08/25/15  3:15 PM  Result Value Ref Range Status   Specimen Description BRONCHIAL ALVEOLAR LAVAGE  Final   Special Requests NONE  Final  Gram Stain   Final    GOOD SPECIMEN - 80-90% WBCS FEW WBC SEEN NO ORGANISMS SEEN    Culture HOLDING FOR POSSIBLE PATHOGEN  Final   Report Status 08/27/2015 FINAL  Final  Culture, blood (routine x 2)     Status: None (Preliminary result)   Collection Time: 09/02/15 12:00 PM  Result Value Ref Range Status   Specimen Description BLOOD RIGHT GROIN  Final   Special Requests   Final    BOTTLES DRAWN AEROBIC AND ANAEROBIC  6CC AERO 10CC ANAERO   Culture NO GROWTH 4 DAYS  Final   Report Status PENDING  Incomplete  Culture, blood (routine x 2)     Status: None (Preliminary result)    Collection Time: 09/02/15 12:15 PM  Result Value Ref Range Status   Specimen Description BLOOD RIGHT GROIN  Final   Special Requests BOTTLES DRAWN AEROBIC AND ANAEROBIC  10 CC  Final   Culture NO GROWTH 4 DAYS  Final   Report Status PENDING  Incomplete  Culture, blood (routine x 2)     Status: None (Preliminary result)   Collection Time: 09/02/15  1:47 PM  Result Value Ref Range Status   Specimen Description BLOOD RIGHT HAND  Final   Special Requests BOTTLES DRAWN AEROBIC AND ANAEROBIC 4CC  Final   Culture NO GROWTH 4 DAYS  Final   Report Status PENDING  Incomplete  Culture, blood (routine x 2)     Status: None (Preliminary result)   Collection Time: 09/02/15  2:06 PM  Result Value Ref Range Status   Specimen Description BLOOD LEFT HAND  Final   Special Requests BOTTLES DRAWN AEROBIC AND ANAEROBIC 1CC  Final   Culture NO GROWTH 4 DAYS  Final   Report Status PENDING  Incomplete  MRSA PCR Screening     Status: None   Collection Time: 09/02/15 10:27 PM  Result Value Ref Range Status   MRSA by PCR NEGATIVE NEGATIVE Final    Comment:        The GeneXpert MRSA Assay (FDA approved for NASAL specimens only), is one component of a comprehensive MRSA colonization surveillance program. It is not intended to diagnose MRSA infection nor to guide or monitor treatment for MRSA infections.   Culture, expectorated sputum-assessment     Status: None   Collection Time: 09/03/15 12:18 AM  Result Value Ref Range Status   Specimen Description SPUTUM  Final   Special Requests Normal  Final   Sputum evaluation THIS SPECIMEN IS ACCEPTABLE FOR SPUTUM CULTURE  Final   Report Status 09/03/2015 FINAL  Final  Culture, respiratory (NON-Expectorated)     Status: None   Collection Time: 09/03/15 12:18 AM  Result Value Ref Range Status   Specimen Description SPUTUM  Final   Special Requests Normal Reflexed from [id number]  Final   Gram Stain   Final    EXCELLENT SPECIMEN - 90-100% WBCS MANY WBC  SEEN MANY GRAM NEGATIVE COCCOBACILLI    Culture MODERATE GROWTH CITROBACTER KOSERI  Final   Report Status 09/05/2015 FINAL  Final   Organism ID, Bacteria CITROBACTER KOSERI  Final      Susceptibility   Citrobacter koseri - MIC*    CEFAZOLIN <=4 SENSITIVE Sensitive     CEFTRIAXONE <=1 SENSITIVE Sensitive     CIPROFLOXACIN <=0.25 SENSITIVE Sensitive     GENTAMICIN <=1 SENSITIVE Sensitive     IMIPENEM <=0.25 SENSITIVE Sensitive     TRIMETH/SULFA <=20 SENSITIVE Sensitive     PIP/TAZO Value in  next row Sensitive      SENSITIVE<=4    LEVOFLOXACIN Value in next row Sensitive      SENSITIVE<=0.12    * MODERATE GROWTH CITROBACTER KOSERI  Body fluid culture     Status: None   Collection Time: 09/04/15 10:30 AM  Result Value Ref Range Status   Specimen Description CYTO PLEU  Final   Special Requests NONE  Final   Gram Stain   Final    MODERATE WBC SEEN TOO NUMEROUS TO COUNT RED BLOOD CELLS NO ORGANISMS SEEN    Culture No growth aerobically or anaerobically.  Final   Report Status 09/08/2015 FINAL  Final  Body fluid culture     Status: None (Preliminary result)   Collection Time: 09/07/15 10:55 AM  Result Value Ref Range Status   Specimen Description CYTO PLEU  Final   Special Requests NONE  Final   Gram Stain PENDING  Incomplete   Culture NO GROWTH < 24 HOURS  Final   Report Status PENDING  Incomplete     ANTIMICROBIALS:  Anti-infectives    Start     Dose/Rate Route Frequency Ordered Stop   09/05/15 1800  levofloxacin (LEVAQUIN) IVPB 750 mg     750 mg 100 mL/hr over 90 Minutes Intravenous Every 48 hours 09/05/15 1544     09/04/15 1200  vancomycin (VANCOCIN) IVPB 1000 mg/200 mL premix  Status:  Discontinued     1,000 mg 200 mL/hr over 60 Minutes Intravenous Every M-W-F (Hemodialysis) 09/02/15 1418 09/04/15 1059   09/03/15 2000  anidulafungin (ERAXIS) 100 mg in sodium chloride 0.9 % 100 mL IVPB  Status:  Discontinued     100 mg over 90 Minutes Intravenous Every 24 hours  09/02/15 1620 09/07/15 1013   09/02/15 2000  anidulafungin (ERAXIS) 200 mg in sodium chloride 0.9 % 200 mL IVPB     200 mg over 180 Minutes Intravenous  Once 09/02/15 1812 09/02/15 2307   09/02/15 1630  anidulafungin (ERAXIS) 200 mg in sodium chloride 0.9 % 200 mL IVPB  Status:  Discontinued     200 mg over 180 Minutes Intravenous  Once 09/02/15 1620 09/02/15 2132   09/02/15 1600  piperacillin-tazobactam (ZOSYN) IVPB 3.375 g  Status:  Discontinued     3.375 g 12.5 mL/hr over 240 Minutes Intravenous Every 12 hours 09/02/15 1053 09/02/15 1307   09/02/15 1430  vancomycin (VANCOCIN) 2,000 mg in sodium chloride 0.9 % 500 mL IVPB     2,000 mg 250 mL/hr over 120 Minutes Intravenous  Once 09/02/15 1416 09/02/15 1820   09/02/15 1315  piperacillin-tazobactam (ZOSYN) IVPB 3.375 g  Status:  Discontinued     3.375 g 12.5 mL/hr over 240 Minutes Intravenous Every 12 hours 09/02/15 1307 09/05/15 1447   08/29/15 1700  vancomycin (VANCOCIN) IVPB 1000 mg/200 mL premix  Status:  Discontinued     1,000 mg 200 mL/hr over 60 Minutes Intravenous every 72 hours 08/26/15 1555 08/27/15 1023   08/27/15 1000  meropenem (MERREM) 1 g in sodium chloride 0.9 % 100 mL IVPB  Status:  Discontinued     1 g 200 mL/hr over 30 Minutes Intravenous Every 24 hours 08/26/15 1555 08/27/15 1023   08/26/15 1800  vancomycin (VANCOCIN) 500 mg in sodium chloride irrigation 0.9 % 100 mL ENEMA  Status:  Discontinued     500 mg Rectal 4 times per day 08/26/15 1531 08/27/15 1023   08/26/15 1700  vancomycin (VANCOCIN) IVPB 1000 mg/200 mL premix     1,000 mg  200 mL/hr over 60 Minutes Intravenous  Once 08/26/15 1555 08/26/15 1723   08/25/15 1600  meropenem (MERREM) 1 g in sodium chloride 0.9 % 100 mL IVPB  Status:  Discontinued     1 g 200 mL/hr over 30 Minutes Intravenous Every 12 hours 08/25/15 1545 08/26/15 1555   08/25/15 1600  vancomycin (VANCOCIN) IVPB 1000 mg/200 mL premix  Status:  Discontinued     1,000 mg 200 mL/hr over 60 Minutes  Intravenous Every 24 hours 08/25/15 1545 08/26/15 1555   08/24/15 1200  vancomycin (VANCOCIN) 50 mg/mL oral solution 125 mg  Status:  Discontinued     125 mg Oral 4 times per day 08/24/15 1035 09/05/15 1447   08/22/15 2000  vancomycin (VANCOCIN) IVPB 1000 mg/200 mL premix  Status:  Discontinued     1,000 mg 200 mL/hr over 60 Minutes Intravenous Every 24 hours 08/21/15 1439 08/22/15 0956   08/22/15 1800  vancomycin (VANCOCIN) 50 mg/mL oral solution 500 mg  Status:  Discontinued     500 mg Oral 4 times per day 08/22/15 1547 08/24/15 1035   08/22/15 1600  vancomycin (VANCOCIN) IVPB 1000 mg/200 mL premix  Status:  Discontinued     1,000 mg 200 mL/hr over 60 Minutes Intravenous Every 18 hours 08/22/15 0956 08/22/15 1001   08/22/15 1600  vancomycin (VANCOCIN) 1,250 mg in sodium chloride 0.9 % 250 mL IVPB  Status:  Discontinued     1,250 mg 166.7 mL/hr over 90 Minutes Intravenous Every 18 hours 08/22/15 1001 08/23/15 0827   08/22/15 1200  vancomycin (VANCOCIN) 50 mg/mL oral solution 125 mg  Status:  Discontinued     125 mg Oral 4 times per day 08/22/15 1035 08/22/15 1550   08/21/15 2200  piperacillin-tazobactam (ZOSYN) IVPB 3.375 g  Status:  Discontinued     3.375 g 12.5 mL/hr over 240 Minutes Intravenous 3 times per day 08/21/15 1431 08/23/15 0827   08/21/15 2000  vancomycin (VANCOCIN) IVPB 1000 mg/200 mL premix     1,000 mg 200 mL/hr over 60 Minutes Intravenous  Once 08/21/15 1439 08/21/15 2248   08/21/15 1300  piperacillin-tazobactam (ZOSYN) IVPB 3.375 g     3.375 g 100 mL/hr over 30 Minutes Intravenous  Once 08/21/15 1245 08/21/15 1457   08/21/15 1300  vancomycin (VANCOCIN) IVPB 1000 mg/200 mL premix     1,000 mg 200 mL/hr over 60 Minutes Intravenous  Once 08/21/15 1245 08/21/15 1457       SUBJECTIVE:  RASS -1, + F/C. Passed SBT and extubated under my guidance. Appears to be tolerating. Some agitation post extubation  VITAL SIGNS: BP 172/101 mmHg  Pulse 108  Temp(Src) 99.2 F (37.3  C) (Axillary)  Resp 32  Ht 5\' 11"  (1.803 m)  Wt 111.5 kg (245 lb 13 oz)  BMI 34.30 kg/m2  SpO2 98%  HEMODYNAMICS:    VENTILATOR SETTINGS: Vent Mode:  [-] PRVC FiO2 (%):  [35 %] 35 % Set Rate:  [16 bmp] 16 bmp Vt Set:  [500 mL] 500 mL PEEP:  [5 cmH20-8 cmH20] 5 cmH20  INTAKE / OUTPUT: I/O last 3 completed shifts: In: 435 [I.V.:175; NG/GT:260] Out: 6000 [Urine:4450; Stool:350; Chest Tube:1200]  PHYSICAL EXAMINATION: General: RASS 0, + F/C Neuro: CNs intact, MAEs HEENT: NCAT, WNL Cardiovascular: reg, no M Lungs:diminished on L Abdomen: soft, NT, +BS Ext: warm, no edema  LABS:  CBC  Recent Labs Lab 09/06/15 0600 09/07/15 0447 09/08/15 0450  WBC 7.7 7.1 7.8  HGB 9.0* 8.7* 9.4*  HCT 28.0*  27.3* 29.5*  PLT 250 247 259   Coag's No results for input(s): APTT, INR in the last 168 hours. BMET  Recent Labs Lab 09/06/15 0600 09/07/15 0447 09/08/15 0450  NA 144 146* 146*  K 5.1 4.9 5.0  CL 115* 116* 116*  CO2 23 23 23   BUN 59* 62* 59*  CREATININE 4.33* 4.19* 3.82*  GLUCOSE 106* 104* 129*   Electrolytes  Recent Labs Lab 09/06/15 0600 09/07/15 0447 09/08/15 0450  CALCIUM 8.9 9.0 9.3   Sepsis Markers No results for input(s): LATICACIDVEN, PROCALCITON, O2SATVEN in the last 168 hours. ABG  Recent Labs Lab 09/02/15 1555 09/06/15 0400 09/07/15 0414  PHART 7.29* 7.32* 7.30*  PCO2ART 46 44 45  PO2ART 126* 137* 165*   Liver Enzymes  Recent Labs Lab 09/05/15 0507 09/08/15 0450  AST 40 40  ALT 49 34  ALKPHOS 208* 446*  BILITOT 0.8 0.5  ALBUMIN 1.8* 1.8*   Cardiac Enzymes No results for input(s): TROPONINI, PROBNP in the last 168 hours. Glucose  Recent Labs Lab 09/07/15 0723 09/07/15 1134 09/07/15 1620 09/07/15 2348 09/08/15 1140 09/08/15 1623  GLUCAP 96 98 102* 103* 96 112*    Imaging Dg Chest Port 1 View  09/08/2015  CLINICAL DATA:  Hypoxia/respiratory distress EXAM: PORTABLE CHEST 1 VIEW COMPARISON:  September 07, 2015  FINDINGS: Endotracheal tube tip is 4.2 cm above the carina. Central catheter tip is in the superior vena cava near the cavoatrial junction. There is a chest tube on the left. Nasogastric tube tip and side port are below the diaphragm. No pneumothorax. There is loculated effusion on the left with left lower lobe airspace consolidation. Right lung is clear. Heart is upper normal in size with pulmonary vascularity within normal limits. No adenopathy. IMPRESSION: Tube and catheter positions as described without pneumothorax. Persistent effusion on the left with left lower lobe consolidation. No new opacity. Right lung clear. No change in cardiac silhouette. Electronically Signed   By: Bretta BangWilliam  Woodruff III M.D.   On: 09/08/2015 07:25  Improved L effusion   ASSESSMENT / PLAN:  PULMONARY A: Prolonged VDRF - now extubated HAP with parapneumonic effusion P:   Monitor in ICU post extubation Supplemental O2 to maintain SpO2 > 92%  CARDIOVASCULAR A:  No acute issues P:  Monitor hemodynamics  RENAL A:   AKI, nonoliguric - improving CKD P:   Monitor BMET intermittently Monitor I/Os Correct electrolytes as indicated Renal service following  GASTROINTESTINAL A: Post extubation dysphagia   P:   SUP: N/I post extubation NPO post extubation SLP eval ordered for 11/23  HEMATOLOGIC A:   Mild anemia without overt bleeding P:  DVT px: SQ heparin Monitor CBC intermittently Transfuse per usual ICU guidelines  INFECTIOUS A:   C diff colitis, fully treated Citrobacter HAP Complicated parapneumonic effusion Severe sepsis, resolving P:   Monitor temp, WBC count Micro and abx as above Cont levofloxacin through 11/26 per ID recs  ENDOCRINE A:   No issues P:   Monitor glu on chem panels Consider SSI for glu > 180  NEUROLOGIC A:   Schizophrenia ? Of seizures - Neuro has seen. Recs no ACDs presently H/O EtOH abuse Agitation Pain P:   RASS goal: 0 PRN fentanyl PRN  haldol   CCM time: 35 mins The above time includes time spent in consultation with patient and/or family members and reviewing care plan on multidisciplinary rounds  Billy Fischeravid Elodie Panameno, MD PCCM service Mobile [phone] Pager [phone]    09/08/2015, 6:05 PM

## 2015-09-08 NOTE — Progress Notes (Addendum)
Nutrition Follow-up     INTERVENTION:  Coordination of care: Await diet progression following extubation. Recommend SLP evaluation if unable to pass bedside swallow study.    NUTRITION DIAGNOSIS:   Inadequate oral intake related to acute illness as evidenced by NPO status.    GOAL:   Patient will meet greater than or equal to 90% of their needs    MONITOR:    (Energy intake, Digestive system, Electrolyte and renal profile)  REASON FOR ASSESSMENT:   Consult Enteral/tube feeding initiation and management  ASSESSMENT:      Pt extubated today.   Pt s/p left chest tube placement from large PE   Current Nutrition: TF currently on hold at this time, previously was tolerating tube feeding at 5950ml/hr   Gastrointestinal Profile: rectal tube in place at this time with liquid stool Last BM: liquid stool   Scheduled Medications:  . antiseptic oral rinse  7 mL Mouth Rinse 10 times per day  . chlorhexidine gluconate  15 mL Mouth Rinse BID  . heparin subcutaneous  5,000 Units Subcutaneous 3 times per day  . ipratropium-albuterol  3 mL Nebulization Q6H  . levofloxacin (LEVAQUIN) IV  750 mg Intravenous Q48H    Continuous Medications:  . dexmedetomidine 0.5 mcg/kg/hr (09/08/15 0909)  . dextrose 5 % and 0.45% NaCl 100 mL/hr at 09/08/15 1200  D5 at 13400ml/hr (providing 408kcals/d)   Electrolyte/Renal Profile and Glucose Profile:   Recent Labs Lab 09/06/15 0600 09/07/15 0447 09/08/15 0450  NA 144 146* 146*  K 5.1 4.9 5.0  CL 115* 116* 116*  CO2 23 23 23   BUN 59* 62* 59*  CREATININE 4.33* 4.19* 3.82*  CALCIUM 8.9 9.0 9.3  GLUCOSE 106* 104* 129*   Protein Profile:   Recent Labs Lab 09/05/15 0507 09/08/15 0450  ALBUMIN 1.8* 1.8*      Weight Trend since Admission: Filed Weights   09/06/15 0500 09/07/15 0638 09/08/15 0500  Weight: 236 lb 12.4 oz (107.4 kg) 245 lb 6 oz (111.3 kg) 245 lb 13 oz (111.5 kg)      Diet Order:  Diet NPO time specified  Skin:    reviewed  Height:   Ht Readings from Last 1 Encounters:  08/21/15 5\' 11"  (1.803 m)    Weight:   Wt Readings from Last 1 Encounters:  09/08/15 245 lb 13 oz (111.5 kg)    Ideal Body Weight:     BMI:  Body mass index is 34.3 kg/(m^2).  Estimated Nutritional Needs:   Kcal:  BEE 1677 kcals (IF 1.1-1.4, AF 1.2) 2213-2817 kcals/d. Using IBW of 78kg  Protein:  (1.2-1.5 g/kg) 94-117 g/d Using IBW of 78kg  Fluid:  (25-5230ml/kg) 1950-234940ml/d Using IBw of 78kg  EDUCATION NEEDS:   No education needs identified at this time  HIGH Care Level  Shloime Keilman B. Freida BusmanAllen, RD, LDN [phone] (pager)

## 2015-09-08 NOTE — Progress Notes (Deleted)
eLink Physician-Brief Progress Note Patient Name: Ross [name] DOB: December 03, 1965 MRN: [id number]   Date of Service  09/08/2015  HPI/Events of Note  Poor BP control even agfter adding clonidine Add nicardipine gtt for goal SBP 170  eICU Interventions       Intervention Category Intermediate Interventions: Hypertension - evaluation and management  Shannel Zahm S. 09/08/2015, 5:43 PM

## 2015-09-08 NOTE — Progress Notes (Signed)
ANTIBIOTIC CONSULT NOTE - follow up  Pharmacy Consult for Levaquin Indication: HCAP  Allergies  Allergen Reactions  . Antihistamines, Diphenhydramine-Type Anaphylaxis and Rash  . Benadryl [Diphenhydramine Hcl] Anaphylaxis and Rash  . Hydroxyzine Anaphylaxis and Rash    Patient Measurements: Height:  (180.3 cm) Weight: 245 lb 13 oz (111.5 kg) IBW/kg (Calculated) : 75.3  Vital Signs: Temp: 99.1 F (37.3 C) (11/22 1200) Temp Source: Oral (11/22 1200) BP: 134/79 mmHg (11/22 1300) Pulse Rate: 86 (11/22 1300) Intake/Output from previous day: 11/21 0701 - 11/22 0700 In: 20 [NG/GT:20] Out: 4400 [Urine:3050; Stool:150; Chest Tube:1200] Intake/Output from this shift: Total I/O In: -  Out: 650 [Urine:650]  Labs:  Recent Labs  09/06/15 0600 09/07/15 0447 09/08/15 0450  WBC 7.7 7.1 7.8  HGB 9.0* 8.7* 9.4*  PLT 250 247 259  CREATININE 4.33* 4.19* 3.82*   Estimated Creatinine Clearance: 30.4 mL/min (by C-G formula based on Cr of 3.82).  Microbiology: Recent Results (from the past 720 hour(s))  Blood Culture (routine x 2)     Status: None   Collection Time: 08/21/15 12:47 PM  Result Value Ref Range Status   Specimen Description BLOOD UNKNOWN  Final   Special Requests BOTTLES DRAWN AEROBIC AND ANAEROBIC  1CC  Final   Culture NO GROWTH 10 DAYS  Final   Report Status 08/31/2015 FINAL  Final  Blood Culture (routine x 2)     Status: None   Collection Time: 08/21/15 12:47 PM  Result Value Ref Range Status   Specimen Description BLOOD LEFT AC  Final   Special Requests BOTTLES DRAWN AEROBIC AND ANAEROBIC  6CC  Final   Culture NO GROWTH 10 DAYS  Final   Report Status 08/31/2015 FINAL  Final  Urine culture     Status: None   Collection Time: 08/21/15 12:47 PM  Result Value Ref Range Status   Specimen Description URINE, CATHETERIZED  Final   Special Requests Normal  Final   Culture NO GROWTH 2 DAYS  Final   Report Status 08/23/2015 FINAL  Final  MRSA PCR Screening      Status: None   Collection Time: 08/21/15 11:47 PM  Result Value Ref Range Status   MRSA by PCR NEGATIVE NEGATIVE Final    Comment:        The GeneXpert MRSA Assay (FDA approved for NASAL specimens only), is one component of a comprehensive MRSA colonization surveillance program. It is not intended to diagnose MRSA infection nor to guide or monitor treatment for MRSA infections.   C difficile quick scan w PCR reflex     Status: Abnormal   Collection Time: 08/22/15  2:31 PM  Result Value Ref Range Status   C Diff antigen POSITIVE (A) NEGATIVE Final   C Diff toxin POSITIVE (A) NEGATIVE Final   C Diff interpretation   Final    Positive for toxigenic C. difficile, active toxin production present.    Comment: CRITICAL RESULT CALLED TO, READ BACK BY AND VERIFIED WITH: CHARLIE FLEETWOOD,RN 08/22/2015 1544 BY J RAZZAKSUAREZ,MT   Culture, bal-quantitative     Status: None   Collection Time: 08/25/15  3:15 PM  Result Value Ref Range Status   Specimen Description BRONCHIAL ALVEOLAR LAVAGE  Final   Special Requests NONE  Final   Gram Stain   Final    GOOD SPECIMEN - 80-90% WBCS FEW WBC SEEN NO ORGANISMS SEEN    Culture HOLDING FOR POSSIBLE PATHOGEN  Final   Report Status 08/27/2015 FINAL  Final  Culture, blood (routine x 2)     Status: None (Preliminary result)   Collection Time: 09/02/15 12:00 PM  Result Value Ref Range Status   Specimen Description BLOOD RIGHT GROIN  Final   Special Requests   Final    BOTTLES DRAWN AEROBIC AND ANAEROBIC  6CC AERO 10CC ANAERO   Culture NO GROWTH 4 DAYS  Final   Report Status PENDING  Incomplete  Culture, blood (routine x 2)     Status: None (Preliminary result)   Collection Time: 09/02/15 12:15 PM  Result Value Ref Range Status   Specimen Description BLOOD RIGHT GROIN  Final   Special Requests BOTTLES DRAWN AEROBIC AND ANAEROBIC  10 CC  Final   Culture NO GROWTH 4 DAYS  Final   Report Status PENDING  Incomplete  Culture, blood (routine x 2)      Status: None (Preliminary result)   Collection Time: 09/02/15  1:47 PM  Result Value Ref Range Status   Specimen Description BLOOD RIGHT HAND  Final   Special Requests BOTTLES DRAWN AEROBIC AND ANAEROBIC 4CC  Final   Culture NO GROWTH 4 DAYS  Final   Report Status PENDING  Incomplete  Culture, blood (routine x 2)     Status: None (Preliminary result)   Collection Time: 09/02/15  2:06 PM  Result Value Ref Range Status   Specimen Description BLOOD LEFT HAND  Final   Special Requests BOTTLES DRAWN AEROBIC AND ANAEROBIC 1CC  Final   Culture NO GROWTH 4 DAYS  Final   Report Status PENDING  Incomplete  MRSA PCR Screening     Status: None   Collection Time: 09/02/15 10:27 PM  Result Value Ref Range Status   MRSA by PCR NEGATIVE NEGATIVE Final    Comment:        The GeneXpert MRSA Assay (FDA approved for NASAL specimens only), is one component of a comprehensive MRSA colonization surveillance program. It is not intended to diagnose MRSA infection nor to guide or monitor treatment for MRSA infections.   Culture, expectorated sputum-assessment     Status: None   Collection Time: 09/03/15 12:18 AM  Result Value Ref Range Status   Specimen Description SPUTUM  Final   Special Requests Normal  Final   Sputum evaluation THIS SPECIMEN IS ACCEPTABLE FOR SPUTUM CULTURE  Final   Report Status 09/03/2015 FINAL  Final  Culture, respiratory (NON-Expectorated)     Status: None   Collection Time: 09/03/15 12:18 AM  Result Value Ref Range Status   Specimen Description SPUTUM  Final   Special Requests Normal Reflexed from [id number]  Final   Gram Stain   Final    EXCELLENT SPECIMEN - 90-100% WBCS MANY WBC SEEN MANY GRAM NEGATIVE COCCOBACILLI    Culture MODERATE GROWTH CITROBACTER KOSERI  Final   Report Status 09/05/2015 FINAL  Final   Organism ID, Bacteria CITROBACTER KOSERI  Final      Susceptibility   Citrobacter koseri - MIC*    CEFAZOLIN <=4 SENSITIVE Sensitive     CEFTRIAXONE <=1  SENSITIVE Sensitive     CIPROFLOXACIN <=0.25 SENSITIVE Sensitive     GENTAMICIN <=1 SENSITIVE Sensitive     IMIPENEM <=0.25 SENSITIVE Sensitive     TRIMETH/SULFA <=20 SENSITIVE Sensitive     PIP/TAZO Value in next row Sensitive      SENSITIVE<=4    LEVOFLOXACIN Value in next row Sensitive      SENSITIVE<=0.12    * MODERATE GROWTH CITROBACTER KOSERI  Body fluid culture  Status: None   Collection Time: 09/04/15 10:30 AM  Result Value Ref Range Status   Specimen Description CYTO PLEU  Final   Special Requests NONE  Final   Gram Stain   Final    MODERATE WBC SEEN TOO NUMEROUS TO COUNT RED BLOOD CELLS NO ORGANISMS SEEN    Culture No growth aerobically or anaerobically.  Final   Report Status 09/08/2015 FINAL  Final  Body fluid culture     Status: None (Preliminary result)   Collection Time: 09/07/15 10:55 AM  Result Value Ref Range Status   Specimen Description CYTO PLEU  Final   Special Requests NONE  Final   Gram Stain PENDING  Incomplete   Culture NO GROWTH < 24 HOURS  Final   Report Status PENDING  Incomplete    Medical History: Past Medical History  Diagnosis Date  . Sleep apnea     had surgery to correct it  . Mental disorder     PTSD  . Seizures (HCC)     currently dx with seizures  . Cardiac arrest (HCC) 08/23/2011  . Pneumonia 09/04/2011  . Pancreatitis   . SOB (shortness of breath) 09/11/2011  . Hypertension   . Anxiety   . Myocardial infarction (HCC)   . Schizophrenia (HCC)   . Rhabdomyolysis 08/23/2015  . Acute renal failure (HCC) 08/23/2015    Medications:  Anti-infectives    Start     Dose/Rate Route Frequency Ordered Stop   09/05/15 1800  levofloxacin (LEVAQUIN) IVPB 750 mg     750 mg 100 mL/hr over 90 Minutes Intravenous Every 48 hours 09/05/15 1544     09/04/15 1200  vancomycin (VANCOCIN) IVPB 1000 mg/200 mL premix  Status:  Discontinued     1,000 mg 200 mL/hr over 60 Minutes Intravenous Every M-W-F (Hemodialysis) 09/02/15 1418 09/04/15 1059    09/03/15 2000  anidulafungin (ERAXIS) 100 mg in sodium chloride 0.9 % 100 mL IVPB  Status:  Discontinued     100 mg over 90 Minutes Intravenous Every 24 hours 09/02/15 1620 09/07/15 1013   09/02/15 2000  anidulafungin (ERAXIS) 200 mg in sodium chloride 0.9 % 200 mL IVPB     200 mg over 180 Minutes Intravenous  Once 09/02/15 1812 09/02/15 2307   09/02/15 1630  anidulafungin (ERAXIS) 200 mg in sodium chloride 0.9 % 200 mL IVPB  Status:  Discontinued     200 mg over 180 Minutes Intravenous  Once 09/02/15 1620 09/02/15 2132   09/02/15 1600  piperacillin-tazobactam (ZOSYN) IVPB 3.375 g  Status:  Discontinued     3.375 g 12.5 mL/hr over 240 Minutes Intravenous Every 12 hours 09/02/15 1053 09/02/15 1307   09/02/15 1430  vancomycin (VANCOCIN) 2,000 mg in sodium chloride 0.9 % 500 mL IVPB     2,000 mg 250 mL/hr over 120 Minutes Intravenous  Once 09/02/15 1416 09/02/15 1820   09/02/15 1315  piperacillin-tazobactam (ZOSYN) IVPB 3.375 g  Status:  Discontinued     3.375 g 12.5 mL/hr over 240 Minutes Intravenous Every 12 hours 09/02/15 1307 09/05/15 1447   08/29/15 1700  vancomycin (VANCOCIN) IVPB 1000 mg/200 mL premix  Status:  Discontinued     1,000 mg 200 mL/hr over 60 Minutes Intravenous every 72 hours 08/26/15 1555 08/27/15 1023   08/27/15 1000  meropenem (MERREM) 1 g in sodium chloride 0.9 % 100 mL IVPB  Status:  Discontinued     1 g 200 mL/hr over 30 Minutes Intravenous Every 24 hours 08/26/15 1555 08/27/15 1023  08/26/15 1800  vancomycin (VANCOCIN) 500 mg in sodium chloride irrigation 0.9 % 100 mL ENEMA  Status:  Discontinued     500 mg Rectal 4 times per day 08/26/15 1531 08/27/15 1023   08/26/15 1700  vancomycin (VANCOCIN) IVPB 1000 mg/200 mL premix     1,000 mg 200 mL/hr over 60 Minutes Intravenous  Once 08/26/15 1555 08/26/15 1723   08/25/15 1600  meropenem (MERREM) 1 g in sodium chloride 0.9 % 100 mL IVPB  Status:  Discontinued     1 g 200 mL/hr over 30 Minutes Intravenous Every 12  hours 08/25/15 1545 08/26/15 1555   08/25/15 1600  vancomycin (VANCOCIN) IVPB 1000 mg/200 mL premix  Status:  Discontinued     1,000 mg 200 mL/hr over 60 Minutes Intravenous Every 24 hours 08/25/15 1545 08/26/15 1555   08/24/15 1200  vancomycin (VANCOCIN) 50 mg/mL oral solution 125 mg  Status:  Discontinued     125 mg Oral 4 times per day 08/24/15 1035 09/05/15 1447   08/22/15 2000  vancomycin (VANCOCIN) IVPB 1000 mg/200 mL premix  Status:  Discontinued     1,000 mg 200 mL/hr over 60 Minutes Intravenous Every 24 hours 08/21/15 1439 08/22/15 0956   08/22/15 1800  vancomycin (VANCOCIN) 50 mg/mL oral solution 500 mg  Status:  Discontinued     500 mg Oral 4 times per day 08/22/15 1547 08/24/15 1035   08/22/15 1600  vancomycin (VANCOCIN) IVPB 1000 mg/200 mL premix  Status:  Discontinued     1,000 mg 200 mL/hr over 60 Minutes Intravenous Every 18 hours 08/22/15 0956 08/22/15 1001   08/22/15 1600  vancomycin (VANCOCIN) 1,250 mg in sodium chloride 0.9 % 250 mL IVPB  Status:  Discontinued     1,250 mg 166.7 mL/hr over 90 Minutes Intravenous Every 18 hours 08/22/15 1001 08/23/15 0827   08/22/15 1200  vancomycin (VANCOCIN) 50 mg/mL oral solution 125 mg  Status:  Discontinued     125 mg Oral 4 times per day 08/22/15 1035 08/22/15 1550   08/21/15 2200  piperacillin-tazobactam (ZOSYN) IVPB 3.375 g  Status:  Discontinued     3.375 g 12.5 mL/hr over 240 Minutes Intravenous 3 times per day 08/21/15 1431 08/23/15 0827   08/21/15 2000  vancomycin (VANCOCIN) IVPB 1000 mg/200 mL premix     1,000 mg 200 mL/hr over 60 Minutes Intravenous  Once 08/21/15 1439 08/21/15 2248   08/21/15 1300  piperacillin-tazobactam (ZOSYN) IVPB 3.375 g     3.375 g 100 mL/hr over 30 Minutes Intravenous  Once 08/21/15 1245 08/21/15 1457   08/21/15 1300  vancomycin (VANCOCIN) IVPB 1000 mg/200 mL premix     1,000 mg 200 mL/hr over 60 Minutes Intravenous  Once 08/21/15 1245 08/21/15 1457     Assessment: 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 MD changing antibiotics from Vancomycin and Zosyn to Levaquin. Patient received Zosyn from 11/16-11/19 Levaquin started 11/19   Plan:  Today is day 7 of ABX (4 days of Zosyn, 3 days of Levaquin) Will continue Levaquin  IV q48h for CrCl <42ml/min. Spoke with Dr. Sampson Goon this afternoon- he intends to treat for a duration of 10 days. Projected stop date 11/25.  Cher Nakai, PharmD Pharmacy Resident 09/08/2015 2:19 PM

## 2015-09-09 ENCOUNTER — Inpatient Hospital Stay: Payer: Medicare Other

## 2015-09-09 LAB — GLUCOSE, CAPILLARY
GLUCOSE-CAPILLARY: 102 mg/dL — AB (ref 65–99)
GLUCOSE-CAPILLARY: 90 mg/dL (ref 65–99)
Glucose-Capillary: 103 mg/dL — ABNORMAL HIGH (ref 65–99)
Glucose-Capillary: 111 mg/dL — ABNORMAL HIGH (ref 65–99)
Glucose-Capillary: 112 mg/dL — ABNORMAL HIGH (ref 65–99)
Glucose-Capillary: 95 mg/dL (ref 65–99)

## 2015-09-09 LAB — CULTURE, BLOOD (ROUTINE X 2)
CULTURE: NO GROWTH
CULTURE: NO GROWTH
CULTURE: NO GROWTH
Culture: NO GROWTH

## 2015-09-09 LAB — BASIC METABOLIC PANEL
ANION GAP: 10 (ref 5–15)
BUN: 55 mg/dL — ABNORMAL HIGH (ref 6–20)
CALCIUM: 9.2 mg/dL (ref 8.9–10.3)
CO2: 20 mmol/L — AB (ref 22–32)
Chloride: 117 mmol/L — ABNORMAL HIGH (ref 101–111)
Creatinine, Ser: 3.67 mg/dL — ABNORMAL HIGH (ref 0.61–1.24)
GFR, EST AFRICAN AMERICAN: 21 mL/min — AB (ref >60)
GFR, EST NON AFRICAN AMERICAN: 18 mL/min — AB (ref >60)
Glucose, Bld: 113 mg/dL — ABNORMAL HIGH (ref 65–99)
POTASSIUM: 4 mmol/L (ref 3.5–5.1)
Sodium: 147 mmol/L — ABNORMAL HIGH (ref 135–145)

## 2015-09-09 MED ORDER — DEXTROSE 5 % IV SOLN
INTRAVENOUS | Status: DC
Start: 2015-09-09 — End: 2015-09-11
  Administered 2015-09-09 – 2015-09-11 (×3): via INTRAVENOUS

## 2015-09-09 MED ORDER — ALTEPLASE 2 MG IJ SOLR
2.0000 mg | Freq: Once | INTRAMUSCULAR | Status: AC
  Administered 2015-09-09: 2 mg
  Filled 2015-09-09: qty 2

## 2015-09-09 MED ORDER — FENTANYL CITRATE (PF) 100 MCG/2ML IJ SOLN: 25.0000 ug | INTRAMUSCULAR | Status: DC | PRN

## 2015-09-09 MED ORDER — STERILE WATER FOR INJECTION IJ SOLN
INTRAMUSCULAR | Status: AC
  Administered 2015-09-09: 10 mL
  Filled 2015-09-09: qty 10

## 2015-09-09 MED ORDER — NEPRO/CARBSTEADY PO LIQD
237.0000 mL | Freq: Three times a day (TID) | ORAL | Status: DC
  Administered 2015-09-09 – 2015-09-10 (×4): 237 mL via ORAL

## 2015-09-09 MED ORDER — DEXTROSE 5 % IV SOLN: INTRAVENOUS | Status: DC

## 2015-09-09 MED ORDER — IPRATROPIUM-ALBUTEROL 0.5-2.5 (3) MG/3ML IN SOLN: 3.0000 mL | RESPIRATORY_TRACT | Status: DC | PRN

## 2015-09-09 NOTE — Progress Notes (Signed)
Subjective:  Pt remains critically ill. Left chest tube in place UOP improved S Cr has improved Extubated now  Objective:  Vital signs in last 24 hours:  Temp:  [99.1 F (37.3 C)-99.5 F (37.5 C)] 99.5 F (37.5 C) (11/23 0800) Pulse Rate:  [79-117] 102 (11/23 0800) Resp:  [14-32] 21 (11/23 0800) BP: (122-177)/(73-106) 144/87 mmHg (11/23 0800) SpO2:  [94 %-100 %] 99 % (11/23 0800) Weight:  [104.2 kg (229 lb 11.5 oz)] 104.2 kg (229 lb 11.5 oz) (11/23 0415)  Weight change: -7.3 kg (-16 lb 1.5 oz) Filed Weights   09/07/15 0638 09/08/15 0500 09/09/15 0415  Weight: 111.3 kg (245 lb 6 oz) 111.5 kg (245 lb 13 oz) 104.2 kg (229 lb 11.5 oz)    Intake/Output:    Intake/Output Summary (Last 24 hours) at 09/09/15 1610 Last data filed at 09/09/15 0900  Gross per 24 hour  Intake   2200 ml  Output   4600 ml  Net  -2400 ml     Physical Exam: General:  critically ill appearing  HEENT  /AT, mouth is dry  Neck  supple  Pulm/lungs  bilateral rhonchi, Left chest tube  CVS/Heart  S1S2 no rubs  Abdomen:   soft,   nontender  Extremities:  + b/l LE edema  Neurologic: Alert, able to follow commands  Skin:  no acute rashes        foley    Basic Metabolic Panel:   Recent Labs Lab 09/05/15 0507 09/06/15 0600 09/07/15 0447 09/08/15 0450 09/09/15 0500  NA UNABLE TO PERFORM DUE TO LIPEMIC INTERFERENCE 144 146* 146* 147*  K 4.9 5.1 4.9 5.0 4.0  CL 113* 115* 116* 116* 117*  CO2 23 23 23 23  20*  GLUCOSE 114* 106* 104* 129* 113*  BUN 61* 59* 62* 59* 55*  CREATININE 4.54* 4.33* 4.19* 3.82* 3.67*  CALCIUM 8.1* 8.9 9.0 9.3 9.2     CBC:  Recent Labs Lab 09/03/15 0807 09/05/15 0507 09/06/15 0600 09/07/15 0447 09/08/15 0450  WBC 15.6* 7.4 7.7 7.1 7.8  HGB 10.0* 8.7* 9.0* 8.7* 9.4*  HCT 31.7* 26.2* 28.0* 27.3* 29.5*  MCV 90.9 91.5 90.2 90.6 89.7  PLT 199 204 250 247 259      Microbiology:  Recent Results (from the past 720 hour(s))  Blood Culture (routine x 2)      Status: None   Collection Time: 08/21/15 12:47 PM  Result Value Ref Range Status   Specimen Description BLOOD UNKNOWN  Final   Special Requests BOTTLES DRAWN AEROBIC AND ANAEROBIC  1CC  Final   Culture NO GROWTH 10 DAYS  Final   Report Status 08/31/2015 FINAL  Final  Blood Culture (routine x 2)     Status: None   Collection Time: 08/21/15 12:47 PM  Result Value Ref Range Status   Specimen Description BLOOD LEFT AC  Final   Special Requests BOTTLES DRAWN AEROBIC AND ANAEROBIC  6CC  Final   Culture NO GROWTH 10 DAYS  Final   Report Status 08/31/2015 FINAL  Final  Urine culture     Status: None   Collection Time: 08/21/15 12:47 PM  Result Value Ref Range Status   Specimen Description URINE, CATHETERIZED  Final   Special Requests Normal  Final   Culture NO GROWTH 2 DAYS  Final   Report Status 08/23/2015 FINAL  Final  MRSA PCR Screening     Status: None   Collection Time: 08/21/15 11:47 PM  Result Value Ref Range Status  MRSA by PCR NEGATIVE NEGATIVE Final    Comment:        The GeneXpert MRSA Assay (FDA approved for NASAL specimens only), is one component of a comprehensive MRSA colonization surveillance program. It is not intended to diagnose MRSA infection nor to guide or monitor treatment for MRSA infections.   C difficile quick scan w PCR reflex     Status: Abnormal   Collection Time: 08/22/15  2:31 PM  Result Value Ref Range Status   C Diff antigen POSITIVE (A) NEGATIVE Final   C Diff toxin POSITIVE (A) NEGATIVE Final   C Diff interpretation   Final    Positive for toxigenic C. difficile, active toxin production present.    Comment: CRITICAL RESULT CALLED TO, READ BACK BY AND VERIFIED WITH: CHARLIE FLEETWOOD,RN 08/22/2015 1544 BY J RAZZAKSUAREZ,MT   Culture, bal-quantitative     Status: None   Collection Time: 08/25/15  3:15 PM  Result Value Ref Range Status   Specimen Description BRONCHIAL ALVEOLAR LAVAGE  Final   Special Requests NONE  Final   Gram Stain   Final     GOOD SPECIMEN - 80-90% WBCS FEW WBC SEEN NO ORGANISMS SEEN    Culture HOLDING FOR POSSIBLE PATHOGEN  Final   Report Status 08/27/2015 FINAL  Final  Culture, blood (routine x 2)     Status: None   Collection Time: 09/02/15 12:00 PM  Result Value Ref Range Status   Specimen Description BLOOD RIGHT GROIN  Final   Special Requests   Final    BOTTLES DRAWN AEROBIC AND ANAEROBIC  6CC AERO 10CC ANAERO   Culture NO GROWTH 7 DAYS  Final   Report Status 09/09/2015 FINAL  Final  Culture, blood (routine x 2)     Status: None   Collection Time: 09/02/15 12:15 PM  Result Value Ref Range Status   Specimen Description BLOOD RIGHT GROIN  Final   Special Requests BOTTLES DRAWN AEROBIC AND ANAEROBIC  10 CC  Final   Culture NO GROWTH 7 DAYS  Final   Report Status 09/09/2015 FINAL  Final  Culture, blood (routine x 2)     Status: None   Collection Time: 09/02/15  1:47 PM  Result Value Ref Range Status   Specimen Description BLOOD RIGHT HAND  Final   Special Requests BOTTLES DRAWN AEROBIC AND ANAEROBIC 4CC  Final   Culture NO GROWTH 7 DAYS  Final   Report Status 09/09/2015 FINAL  Final  Culture, blood (routine x 2)     Status: None   Collection Time: 09/02/15  2:06 PM  Result Value Ref Range Status   Specimen Description BLOOD LEFT HAND  Final   Special Requests BOTTLES DRAWN AEROBIC AND ANAEROBIC 1CC  Final   Culture NO GROWTH 7 DAYS  Final   Report Status 09/09/2015 FINAL  Final  MRSA PCR Screening     Status: None   Collection Time: 09/02/15 10:27 PM  Result Value Ref Range Status   MRSA by PCR NEGATIVE NEGATIVE Final    Comment:        The GeneXpert MRSA Assay (FDA approved for NASAL specimens only), is one component of a comprehensive MRSA colonization surveillance program. It is not intended to diagnose MRSA infection nor to guide or monitor treatment for MRSA infections.   Culture, expectorated sputum-assessment     Status: None   Collection Time: 09/03/15 12:18 AM  Result  Value Ref Range Status   Specimen Description SPUTUM  Final   Special  Requests Normal  Final   Sputum evaluation THIS SPECIMEN IS ACCEPTABLE FOR SPUTUM CULTURE  Final   Report Status 09/03/2015 FINAL  Final  Culture, respiratory (NON-Expectorated)     Status: None   Collection Time: 09/03/15 12:18 AM  Result Value Ref Range Status   Specimen Description SPUTUM  Final   Special Requests Normal Reflexed from [id number]  Final   Gram Stain   Final    EXCELLENT SPECIMEN - 90-100% WBCS MANY WBC SEEN MANY GRAM NEGATIVE COCCOBACILLI    Culture MODERATE GROWTH CITROBACTER KOSERI  Final   Report Status 09/05/2015 FINAL  Final   Organism ID, Bacteria CITROBACTER KOSERI  Final      Susceptibility   Citrobacter koseri - MIC*    CEFAZOLIN <=4 SENSITIVE Sensitive     CEFTRIAXONE <=1 SENSITIVE Sensitive     CIPROFLOXACIN <=0.25 SENSITIVE Sensitive     GENTAMICIN <=1 SENSITIVE Sensitive     IMIPENEM <=0.25 SENSITIVE Sensitive     TRIMETH/SULFA <=20 SENSITIVE Sensitive     PIP/TAZO Value in next row Sensitive      SENSITIVE<=4    LEVOFLOXACIN Value in next row Sensitive      SENSITIVE<=0.12    * MODERATE GROWTH CITROBACTER KOSERI  Body fluid culture     Status: None   Collection Time: 09/04/15 10:30 AM  Result Value Ref Range Status   Specimen Description CYTO PLEU  Final   Special Requests NONE  Final   Gram Stain   Final    MODERATE WBC SEEN TOO NUMEROUS TO COUNT RED BLOOD CELLS NO ORGANISMS SEEN    Culture No growth aerobically or anaerobically.  Final   Report Status 09/08/2015 FINAL  Final  Body fluid culture     Status: None (Preliminary result)   Collection Time: 09/07/15 10:55 AM  Result Value Ref Range Status   Specimen Description CYTO PLEU  Final   Special Requests NONE  Final   Gram Stain FEW WBC SEEN NO ORGANISMS SEEN   Final   Culture NO GROWTH 2 DAYS  Final   Report Status PENDING  Incomplete    Coagulation Studies: No results for input(s): LABPROT, INR in the last  72 hours.  Urinalysis: No results for input(s): COLORURINE, LABSPEC, PHURINE, GLUCOSEU, HGBUR, BILIRUBINUR, KETONESUR, PROTEINUR, UROBILINOGEN, NITRITE, LEUKOCYTESUR in the last 72 hours.  Invalid input(s): APPERANCEUR    Imaging: Ct Chest Wo Contrast  09/07/2015  CLINICAL DATA:  Pleural effusion EXAM: CT CHEST WITHOUT CONTRAST TECHNIQUE: Multidetector CT imaging of the chest was performed following the standard protocol without IV contrast. COMPARISON:  None. FINDINGS: Large left pleural effusion is associated with compressive atelectasis of a large portion of the left lung. The left lower lobe mainstem bronchus has an abnormal appearance with narrowing and possible compression. Endobronchial lesion is not excluded. Tiny right pleural effusion and minimal right basilar atelectasis. Endotracheal tube and NG tube are in place. Left upper extremity PICC in place with its tip at the cavoatrial junction. Minimal coronary artery calcifications in the LAD territory. No abnormal mediastinal adenopathy.  No pericardial effusion. IMPRESSION: Large left pleural effusion is associated with compressive atelectasis of much of the left lung. Endobronchial lesion in the left lower lobe bronchus is not excluded. Bronchoscopy may be helpful. Tiny right pleural effusion and minimal right basilar atelectasis. Electronically Signed   By: Jolaine Click M.D.   On: 09/07/2015 13:37   Dg Chest Port 1 View  09/09/2015  CLINICAL DATA:  Respiratory failure. EXAM: PORTABLE CHEST  1 VIEW COMPARISON:  09/08/2015.  CT 09/07/2015. FINDINGS: Left PICC line in stable position. Left chest tube in stable position. No pneumothorax. Stable cardiomegaly. Persistent left lower lobe infiltrate and left pleural effusion present. IMPRESSION: 1. Left chest tube in stable position.  No pneumothorax. 2. Persistent left base infiltrate and left pleural effusion. No interim change. Electronically Signed   By: Maisie Fus  Register   On: 09/09/2015 07:51    Dg Chest Port 1 View  09/08/2015  CLINICAL DATA:  Hypoxia/respiratory distress EXAM: PORTABLE CHEST 1 VIEW COMPARISON:  September 07, 2015 FINDINGS: Endotracheal tube tip is 4.2 cm above the carina. Central catheter tip is in the superior vena cava near the cavoatrial junction. There is a chest tube on the left. Nasogastric tube tip and side port are below the diaphragm. No pneumothorax. There is loculated effusion on the left with left lower lobe airspace consolidation. Right lung is clear. Heart is upper normal in size with pulmonary vascularity within normal limits. No adenopathy. IMPRESSION: Tube and catheter positions as described without pneumothorax. Persistent effusion on the left with left lower lobe consolidation. No new opacity. Right lung clear. No change in cardiac silhouette. Electronically Signed   By: Bretta Bang III M.D.   On: 09/08/2015 07:25   Dg Chest Port 1 View  09/07/2015  CLINICAL DATA:  Chest tube placement. EXAM: PORTABLE CHEST 1 VIEW COMPARISON:  Same day. FINDINGS: Stable cardiomediastinal silhouette. Endotracheal and nasogastric tubes are unchanged in position. Left-sided PICC line is unchanged in position with distal tip in expected position of right atrium. No pneumothorax is noted. Right lung is clear. Interval placement of left-sided chest tube with tip in left lung apex. Stable moderate left pleural effusion is noted with probable left lower lobe atelectasis. Bony thorax is unremarkable. IMPRESSION: Interval placement of left-sided chest tube with continued presence of moderate left pleural effusion with probable left lower lobe atelectasis. Stable support apparatus. Left-sided PICC line tip is again noted in right atrium which is unchanged compared to prior exam ; withdrawal by 3-4 cm is recommended. These results will be called to the ordering clinician or representative by the Radiologist Assistant, and communication documented in the PACS or zVision Dashboard.  Electronically Signed   By: Lupita Raider, M.D.   On: 09/07/2015 15:39   US Thoracentesis Asp Pleural Space W/img Guide  09/07/2015  CLINICAL DATA:  Left pleural effusion. EXAM: ULTRASOUND GUIDED left THORACENTESIS COMPARISON:  September 04, 2015 PROCEDURE: Informed consent was obtained from the patient's family. Ultrasound was performed to localize and mark an adequate pocket of fluid in the left chest. It should be noted that multiple septations are noted within the effusion currently, consistent with multiloculated effusion. The area was then prepped and draped in the normal sterile fashion. 1% Lidocaine was used for local anesthesia. Under ultrasound guidance a Safe-T-Centesis catheter was introduced. Thoracentesis was performed. The catheter was removed and a dressing applied. COMPLICATIONS: None. FINDINGS: A total of approximately 35 mL of serosanguineous fluid was removed. A fluid sample wassent for laboratory analysis. IMPRESSION: Successful ultrasound guided left thoracentesis yielding 35 mL of pleural fluid. Multiple septations are now noted within the effusion consistent with multiloculated effusion. Electronically Signed   By: Lupita Raider, M.D.   On: 09/07/2015 12:23     Medications:   . dextrose     . antiseptic oral rinse  7 mL Mouth Rinse QID  . chlorhexidine gluconate  15 mL Mouth Rinse BID  . heparin subcutaneous  5,000 Units Subcutaneous 3 times per day  . levofloxacin (LEVAQUIN) IV  750 mg Intravenous Q48H   acetaminophen **OR** acetaminophen, fentaNYL (SUBLIMAZE) injection, haloperidol lactate, hydrALAZINE, Influenza vac split quadrivalent PF, ipratropium-albuterol, ondansetron (ZOFRAN) IV  Assessment/ Plan:  49 y.o. male With alcohol abuse, schizophrenia, seizure disorder, coronary disease/MI 2012 was admitted to [facility] on November 4 after being found unresponsive in his apartment. Urine toxicology screen is positive for benzodiazepines and cannabinoids. Stool positive for  C. Difficile  1. Acute renal failure, likely secondary to ATN from concurrent illness, volume depletion, C. difficile, rhabdomyolysis and sepsis.   - Good UOP noted ,  - S Creatinine is improving slowly - Dialysis catheter removed  2. Acute respiratory failure due to bacterial pneumonia/Left pleural effusion s/p thoracentesis 11/18. - chest tube 11/22 - extubated now  3.  Hypernatremia. Na 147 today,  - Free water supplementation  4.  Hypokalemia. Resolved.  5. Infective Colitis A09: c. Diff positive.  - treated with PO vancomycin previously.  6.  Acute rhabdomyolysis M62.82: Contributed to acute renal failure, resolved with conservative management.   LOS: 19 Kahleah Crass 11/23/20169:58 AM

## 2015-09-09 NOTE — Progress Notes (Signed)
ANTIBIOTIC CONSULT NOTE - follow up  Pharmacy Consult for Levaquin Indication: HCAP  Allergies  Allergen Reactions  . Antihistamines, Diphenhydramine-Type Anaphylaxis and Rash  . Benadryl [Diphenhydramine Hcl] Anaphylaxis and Rash  . Hydroxyzine Anaphylaxis and Rash    Patient Measurements: Height:  (180.3 cm) Weight: 229 lb 11.5 oz (104.2 kg) IBW/kg (Calculated) : 75.3  Vital Signs: Temp: 99.5 F (37.5 C) (11/23 0800) Temp Source: Axillary (11/23 0800) BP: 147/89 mmHg (11/23 1100) Pulse Rate: 92 (11/23 1100) Intake/Output from previous day: 11/22 0701 - 11/23 0700 In: 1108.3 [I.V.:1108.3] Out: 4300 [Urine:2600; Stool:800; Chest Tube:900] Intake/Output from this shift: Total I/O In: 1091.7 [I.V.:1091.7] Out: 575 [Chest Tube:575]  Labs:  Recent Labs  09/07/15 0447 09/08/15 0450 09/09/15 0500  WBC 7.1 7.8  --   HGB 8.7* 9.4*  --   PLT 247 259  --   CREATININE 4.19* 3.82* 3.67*   Estimated Creatinine Clearance: 30.6 mL/min (by C-G formula based on Cr of 3.67).  Microbiology: Recent Results (from the past 720 hour(s))  Blood Culture (routine x 2)     Status: None   Collection Time: 08/21/15 12:47 PM  Result Value Ref Range Status   Specimen Description BLOOD UNKNOWN  Final   Special Requests BOTTLES DRAWN AEROBIC AND ANAEROBIC  1CC  Final   Culture NO GROWTH 10 DAYS  Final   Report Status 08/31/2015 FINAL  Final  Blood Culture (routine x 2)     Status: None   Collection Time: 08/21/15 12:47 PM  Result Value Ref Range Status   Specimen Description BLOOD LEFT AC  Final   Special Requests BOTTLES DRAWN AEROBIC AND ANAEROBIC  6CC  Final   Culture NO GROWTH 10 DAYS  Final   Report Status 08/31/2015 FINAL  Final  Urine culture     Status: None   Collection Time: 08/21/15 12:47 PM  Result Value Ref Range Status   Specimen Description URINE, CATHETERIZED  Final   Special Requests Normal  Final   Culture NO GROWTH 2 DAYS  Final   Report Status 08/23/2015  FINAL  Final  MRSA PCR Screening     Status: None   Collection Time: 08/21/15 11:47 PM  Result Value Ref Range Status   MRSA by PCR NEGATIVE NEGATIVE Final    Comment:        The GeneXpert MRSA Assay (FDA approved for NASAL specimens only), is one component of a comprehensive MRSA colonization surveillance program. It is not intended to diagnose MRSA infection nor to guide or monitor treatment for MRSA infections.   C difficile quick scan w PCR reflex     Status: Abnormal   Collection Time: 08/22/15  2:31 PM  Result Value Ref Range Status   C Diff antigen POSITIVE (A) NEGATIVE Final   C Diff toxin POSITIVE (A) NEGATIVE Final   C Diff interpretation   Final    Positive for toxigenic C. difficile, active toxin production present.    Comment: CRITICAL RESULT CALLED TO, READ BACK BY AND VERIFIED WITH: CHARLIE FLEETWOOD,RN 08/22/2015 1544 BY J RAZZAKSUAREZ,MT   Culture, bal-quantitative     Status: None   Collection Time: 08/25/15  3:15 PM  Result Value Ref Range Status   Specimen Description BRONCHIAL ALVEOLAR LAVAGE  Final   Special Requests NONE  Final   Gram Stain   Final    GOOD SPECIMEN - 80-90% WBCS FEW WBC SEEN NO ORGANISMS SEEN    Culture HOLDING FOR POSSIBLE PATHOGEN  Final  Report Status 08/27/2015 FINAL  Final  Culture, blood (routine x 2)     Status: None   Collection Time: 09/02/15 12:00 PM  Result Value Ref Range Status   Specimen Description BLOOD RIGHT GROIN  Final   Special Requests   Final    BOTTLES DRAWN AEROBIC AND ANAEROBIC  6CC AERO 10CC ANAERO   Culture NO GROWTH 7 DAYS  Final   Report Status 09/09/2015 FINAL  Final  Culture, blood (routine x 2)     Status: None   Collection Time: 09/02/15 12:15 PM  Result Value Ref Range Status   Specimen Description BLOOD RIGHT GROIN  Final   Special Requests BOTTLES DRAWN AEROBIC AND ANAEROBIC  10 CC  Final   Culture NO GROWTH 7 DAYS  Final   Report Status 09/09/2015 FINAL  Final  Culture, blood (routine x  2)     Status: None   Collection Time: 09/02/15  1:47 PM  Result Value Ref Range Status   Specimen Description BLOOD RIGHT HAND  Final   Special Requests BOTTLES DRAWN AEROBIC AND ANAEROBIC 4CC  Final   Culture NO GROWTH 7 DAYS  Final   Report Status 09/09/2015 FINAL  Final  Culture, blood (routine x 2)     Status: None   Collection Time: 09/02/15  2:06 PM  Result Value Ref Range Status   Specimen Description BLOOD LEFT HAND  Final   Special Requests BOTTLES DRAWN AEROBIC AND ANAEROBIC 1CC  Final   Culture NO GROWTH 7 DAYS  Final   Report Status 09/09/2015 FINAL  Final  MRSA PCR Screening     Status: None   Collection Time: 09/02/15 10:27 PM  Result Value Ref Range Status   MRSA by PCR NEGATIVE NEGATIVE Final    Comment:        The GeneXpert MRSA Assay (FDA approved for NASAL specimens only), is one component of a comprehensive MRSA colonization surveillance program. It is not intended to diagnose MRSA infection nor to guide or monitor treatment for MRSA infections.   Culture, expectorated sputum-assessment     Status: None   Collection Time: 09/03/15 12:18 AM  Result Value Ref Range Status   Specimen Description SPUTUM  Final   Special Requests Normal  Final   Sputum evaluation THIS SPECIMEN IS ACCEPTABLE FOR SPUTUM CULTURE  Final   Report Status 09/03/2015 FINAL  Final  Culture, respiratory (NON-Expectorated)     Status: None   Collection Time: 09/03/15 12:18 AM  Result Value Ref Range Status   Specimen Description SPUTUM  Final   Special Requests Normal Reflexed from [id number]  Final   Gram Stain   Final    EXCELLENT SPECIMEN - 90-100% WBCS MANY WBC SEEN MANY GRAM NEGATIVE COCCOBACILLI    Culture MODERATE GROWTH CITROBACTER KOSERI  Final   Report Status 09/05/2015 FINAL  Final   Organism ID, Bacteria CITROBACTER KOSERI  Final      Susceptibility   Citrobacter koseri - MIC*    CEFAZOLIN <=4 SENSITIVE Sensitive     CEFTRIAXONE <=1 SENSITIVE Sensitive      CIPROFLOXACIN <=0.25 SENSITIVE Sensitive     GENTAMICIN <=1 SENSITIVE Sensitive     IMIPENEM <=0.25 SENSITIVE Sensitive     TRIMETH/SULFA <=20 SENSITIVE Sensitive     PIP/TAZO Value in next row Sensitive      SENSITIVE<=4    LEVOFLOXACIN Value in next row Sensitive      SENSITIVE<=0.12    * MODERATE GROWTH CITROBACTER KOSERI  Body fluid  culture     Status: None   Collection Time: 09/04/15 10:30 AM  Result Value Ref Range Status   Specimen Description CYTO PLEU  Final   Special Requests NONE  Final   Gram Stain   Final    MODERATE WBC SEEN TOO NUMEROUS TO COUNT RED BLOOD CELLS NO ORGANISMS SEEN    Culture No growth aerobically or anaerobically.  Final   Report Status 09/08/2015 FINAL  Final  Body fluid culture     Status: None (Preliminary result)   Collection Time: 09/07/15 10:55 AM  Result Value Ref Range Status   Specimen Description CYTO PLEU  Final   Special Requests NONE  Final   Gram Stain FEW WBC SEEN NO ORGANISMS SEEN   Final   Culture NO GROWTH 2 DAYS  Final   Report Status PENDING  Incomplete    Medical History: Past Medical History  Diagnosis Date  . Sleep apnea     had surgery to correct it  . Mental disorder     PTSD  . Seizures (HCC)     currently dx with seizures  . Cardiac arrest (HCC) 08/23/2011  . Pneumonia 09/04/2011  . Pancreatitis   . SOB (shortness of breath) 09/11/2011  . Hypertension   . Anxiety   . Myocardial infarction (HCC)   . Schizophrenia (HCC)   . Rhabdomyolysis 08/23/2015  . Acute renal failure (HCC) 08/23/2015    Medications:  Anti-infectives    Start     Dose/Rate Route Frequency Ordered Stop   09/05/15 1800  levofloxacin (LEVAQUIN) IVPB 750 mg     750 mg 100 mL/hr over 90 Minutes Intravenous Every 48 hours 09/05/15 1544     09/04/15 1200  vancomycin (VANCOCIN) IVPB 1000 mg/200 mL premix  Status:  Discontinued     1,000 mg 200 mL/hr over 60 Minutes Intravenous Every M-W-F (Hemodialysis) 09/02/15 1418 09/04/15 1059    09/03/15 2000  anidulafungin (ERAXIS) 100 mg in sodium chloride 0.9 % 100 mL IVPB  Status:  Discontinued     100 mg over 90 Minutes Intravenous Every 24 hours 09/02/15 1620 09/07/15 1013   09/02/15 2000  anidulafungin (ERAXIS) 200 mg in sodium chloride 0.9 % 200 mL IVPB     200 mg over 180 Minutes Intravenous  Once 09/02/15 1812 09/02/15 2307   09/02/15 1630  anidulafungin (ERAXIS) 200 mg in sodium chloride 0.9 % 200 mL IVPB  Status:  Discontinued     200 mg over 180 Minutes Intravenous  Once 09/02/15 1620 09/02/15 2132   09/02/15 1600  piperacillin-tazobactam (ZOSYN) IVPB 3.375 g  Status:  Discontinued     3.375 g 12.5 mL/hr over 240 Minutes Intravenous Every 12 hours 09/02/15 1053 09/02/15 1307   09/02/15 1430  vancomycin (VANCOCIN) 2,000 mg in sodium chloride 0.9 % 500 mL IVPB     2,000 mg 250 mL/hr over 120 Minutes Intravenous  Once 09/02/15 1416 09/02/15 1820   09/02/15 1315  piperacillin-tazobactam (ZOSYN) IVPB 3.375 g  Status:  Discontinued     3.375 g 12.5 mL/hr over 240 Minutes Intravenous Every 12 hours 09/02/15 1307 09/05/15 1447   08/29/15 1700  vancomycin (VANCOCIN) IVPB 1000 mg/200 mL premix  Status:  Discontinued     1,000 mg 200 mL/hr over 60 Minutes Intravenous every 72 hours 08/26/15 1555 08/27/15 1023   08/27/15 1000  meropenem (MERREM) 1 g in sodium chloride 0.9 % 100 mL IVPB  Status:  Discontinued     1 g 200 mL/hr over  30 Minutes Intravenous Every 24 hours 08/26/15 1555 08/27/15 1023   08/26/15 1800  vancomycin (VANCOCIN) 500 mg in sodium chloride irrigation 0.9 % 100 mL ENEMA  Status:  Discontinued     500 mg Rectal 4 times per day 08/26/15 1531 08/27/15 1023   08/26/15 1700  vancomycin (VANCOCIN) IVPB 1000 mg/200 mL premix     1,000 mg 200 mL/hr over 60 Minutes Intravenous  Once 08/26/15 1555 08/26/15 1723   08/25/15 1600  meropenem (MERREM) 1 g in sodium chloride 0.9 % 100 mL IVPB  Status:  Discontinued     1 g 200 mL/hr over 30 Minutes Intravenous Every 12  hours 08/25/15 1545 08/26/15 1555   08/25/15 1600  vancomycin (VANCOCIN) IVPB 1000 mg/200 mL premix  Status:  Discontinued     1,000 mg 200 mL/hr over 60 Minutes Intravenous Every 24 hours 08/25/15 1545 08/26/15 1555   08/24/15 1200  vancomycin (VANCOCIN) 50 mg/mL oral solution 125 mg  Status:  Discontinued     125 mg Oral 4 times per day 08/24/15 1035 09/05/15 1447   08/22/15 2000  vancomycin (VANCOCIN) IVPB 1000 mg/200 mL premix  Status:  Discontinued     1,000 mg 200 mL/hr over 60 Minutes Intravenous Every 24 hours 08/21/15 1439 08/22/15 0956   08/22/15 1800  vancomycin (VANCOCIN) 50 mg/mL oral solution 500 mg  Status:  Discontinued     500 mg Oral 4 times per day 08/22/15 1547 08/24/15 1035   08/22/15 1600  vancomycin (VANCOCIN) IVPB 1000 mg/200 mL premix  Status:  Discontinued     1,000 mg 200 mL/hr over 60 Minutes Intravenous Every 18 hours 08/22/15 0956 08/22/15 1001   08/22/15 1600  vancomycin (VANCOCIN) 1,250 mg in sodium chloride 0.9 % 250 mL IVPB  Status:  Discontinued     1,250 mg 166.7 mL/hr over 90 Minutes Intravenous Every 18 hours 08/22/15 1001 08/23/15 0827   08/22/15 1200  vancomycin (VANCOCIN) 50 mg/mL oral solution 125 mg  Status:  Discontinued     125 mg Oral 4 times per day 08/22/15 1035 08/22/15 1550   08/21/15 2200  piperacillin-tazobactam (ZOSYN) IVPB 3.375 g  Status:  Discontinued     3.375 g 12.5 mL/hr over 240 Minutes Intravenous 3 times per day 08/21/15 1431 08/23/15 0827   08/21/15 2000  vancomycin (VANCOCIN) IVPB 1000 mg/200 mL premix     1,000 mg 200 mL/hr over 60 Minutes Intravenous  Once 08/21/15 1439 08/21/15 2248   08/21/15 1300  piperacillin-tazobactam (ZOSYN) IVPB 3.375 g     3.375 g 100 mL/hr over 30 Minutes Intravenous  Once 08/21/15 1245 08/21/15 1457   08/21/15 1300  vancomycin (VANCOCIN) IVPB 1000 mg/200 mL premix     1,000 mg 200 mL/hr over 60 Minutes Intravenous  Once 08/21/15 1245 08/21/15 1457     Assessment: 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 MD changing antibiotics from Vancomycin and Zosyn to Levaquin. Patient received Zosyn from 11/16-11/19 Levaquin started 11/19   Plan:  Continue levofloxacin 750 mg iv q 48 hours. Will plan on changing Levaquin to po tomorrow if patient tolerates diet and meets other requirements for iv to po conversion. Levaquin to continue through 11/26 per ID.   Luisa Hart, PharmD 09/09/2015 11:41 AM

## 2015-09-09 NOTE — Evaluation (Signed)
Clinical/Bedside Swallow Evaluation Patient Details  Name: Ross [name] MRN: [id number] Date of Birth: 1966-02-07  Today's Date: 09/09/2015 Time: SLP Start Time (ACUTE ONLY): 0945 SLP Stop Time (ACUTE ONLY): 1045 SLP Time Calculation (min) (ACUTE ONLY): 60 min  Past Medical History:  Past Medical History  Diagnosis Date  . Sleep apnea     had surgery to correct it  . Mental disorder     PTSD  . Seizures (HCC)     currently dx with seizures  . Cardiac arrest (HCC) 08/23/2011  . Pneumonia 09/04/2011  . Pancreatitis   . SOB (shortness of breath) 09/11/2011  . Hypertension   . Anxiety   . Myocardial infarction (HCC)   . Schizophrenia (HCC)   . Rhabdomyolysis 08/23/2015  . Acute renal failure (HCC) 08/23/2015   Past Surgical History:  Past Surgical History  Procedure Laterality Date  . Tonsillectomy    . Uvulopalatopharyngoplasty, tonsillectomy and septoplasty  06/16/2000  . Left heart catheterization with coronary angiogram N/A 08/24/2011    Procedure: LEFT HEART CATHETERIZATION WITH CORONARY ANGIOGRAM;  Surgeon: Corky Crafts, MD;  Location: [facility] CATH LAB;  Service: Cardiovascular;  Laterality: N/A;  . Intubation  08/26/2015        HPI:  Pt is a 49 y.o. male with a known c/o "heavy" alcohol abuse, schizophrenia, seizure disorder, coronary artery disease status post MI in 2012 resents by EMS after being found unresponsive in his apartment. He has had no contact with anyone for at least 24 hours. EMS reported seizure-like activity on arrival and during transport. On arrival to the emergency room he was able to state his name and follow simple Commands. He was very lethargic and responding minimally. He was hypoxic with oxygen saturations of 88% on 6 L. He was intubated in the emergency room by Dr. York Cerise due to respiratory failure with hypoxia. On presentation his clothes were soaked with urine and loose feces. He is being admitted for sepsis with high fever of 103, lactate of  2.3, multiorgan failure. Pt self extubated and is currently NPO; more awake and following commands. Pt was reintubated on 11.15.16 and extubated again on 11.22.16. He also has a chest tube in place on his Left side. Pt is awaken, soft spoken and only saying a few words; attends and follows most basic commands.    Assessment / Plan / Recommendation Clinical Impression  Pt presented w/ adequate oropharngeal phases of swallowing w/ no demo. overt s/s of aspiration during po trials; oral phase was appropriate for bolus management, mastication, and oral clearing for moistened soft solids. Pt required assistance w/ feeding but was able to help hold cup and finger food item x1. Pt requires verbal cue for attention to task when taking po trials; suspect related to recent illness requiring oral intubation and mental status baseline. Pt appears at min. increased risk for aspiration sec. to current Cognitive presentation and recent, multiple oral intubations. NSG present during session and updated. MD updated.   This was a reassessment d/t pt's medical status requiring reintubation, now extubated.     Aspiration Risk  Mild aspiration risk    Diet Recommendation  Dys. 2 diet w/ thin liquids; aspiration precautions; feeding assistance at meals d/t UE weakness, Cognition  Medication Administration: Whole meds with puree    Other  Recommendations Recommended Consults:  (Dietician) Oral Care Recommendations: Oral care BID;Staff/trained caregiver to provide oral care   Follow up Recommendations   (TBD)    Frequency and Duration min 2x/week  1 week       Prognosis Prognosis for Safe Diet Advancement: Good Barriers to Reach Goals: Cognitive deficits      Swallow Study   General Date of Onset: 08/21/15 HPI: Pt is a 49 y.o. male with a known c/o "heavy" alcohol abuse, schizophrenia, seizure disorder, coronary artery disease status post MI in 2012 resents by EMS after being found unresponsive in his  apartment. He has had no contact with anyone for at least 24 hours. EMS reported seizure-like activity on arrival and during transport. On arrival to the emergency room he was able to state his name and follow simple Commands. He was very lethargic and responding minimally. He was hypoxic with oxygen saturations of 88% on 6 L. He was intubated in the emergency room by Dr. York CeriseForbach due to respiratory failure with hypoxia. On presentation his clothes were soaked with urine and loose feces. He is being admitted for sepsis with high fever of 103, lactate of 2.3, multiorgan failure. Pt self extubated and is currently NPO; more awake and following commands. Pt was reintubated on 11.15.16 and extubated again on 11.22.16. He also has a chest tube in place on his Left side. Pt is awaken, soft spoken and only saying a few words; attends and follows most basic commands.  Type of Study: Bedside Swallow Evaluation (reassessment sec. to another oral intubation/extubation) Previous Swallow Assessment: 11.10.16 Diet Prior to this Study: NPO (intubated; regular prior to admission) Temperature Spikes Noted:  (temp 99; wbc 7.8 on 11.22.16) Respiratory Status: Nasal cannula (2 liters) History of Recent Intubation: Yes Length of Intubations (days): 5 days (then 7 more days) Date extubated: 09/08/15 Behavior/Cognition: Alert;Cooperative;Confused;Distractible;Requires cueing Oral Cavity Assessment: Within Functional Limits Oral Care Completed by SLP: Yes Oral Cavity - Dentition: Adequate natural dentition Vision: Functional for self-feeding Self-Feeding Abilities: Needs assist;Needs set up;Total assist (weak bilat. UEs) Patient Positioning: Upright in bed Baseline Vocal Quality: Low vocal intensity Volitional Cough: Strong Volitional Swallow: Able to elicit    Oral/Motor/Sensory Function Overall Oral Motor/Sensory Function: Within functional limits   Ice Chips Ice chips: Within functional limits Presentation: Spoon  (fed; 3 trials)   Thin Liquid Thin Liquid: Within functional limits Presentation: Cup;Straw (assisted w/ feeding but held cup) Other Comments: ~4 ozs    Nectar Thick Nectar Thick Liquid: Not tested   Honey Thick Honey Thick Liquid: Not tested   Puree Puree: Within functional limits Presentation: Spoon (fed; 8 trials)   Solid Solid: Within functional limits Presentation: Spoon (fed; 5 trials moistened) Other Comments: masticated appropriately      Jerilynn SomKatherine Watson, MS, CCC-SLP  Watson,Katherine 09/09/2015,6:23 PM

## 2015-09-09 NOTE — Progress Notes (Signed)
Rehab Admissions Coordinator Note:  Patient was screened by Ross [name], [name] for appropriateness for an Inpatient Acute Rehab Consult per P.T. Recommendation. Noted pt to begin making progress again with therapy as he remains extubated, good urine output per nephrology with acute renal failure not requiring dialysis. At this time, we are recommending an OT evaluation and I will continue to monitor his ability to tolerate more therapy. If he continues to progress, I would like an inpatient Rehabilitation consult order. I can then do an onsite assessment for the possibility of [facility] [place].   Ross [name], Skylarr Liz Godwin 09/09/2015, 10:16 PM  I can be reached at [phone]

## 2015-09-09 NOTE — Progress Notes (Signed)
PULMONARY / CRITICAL CARE MEDICINE   Name: Ross [name] MRN: [id number] DOB: January 12, 1966    ADMISSION DATE:  08/21/2015 CONSULTATION DATE:  11/05  PT PROFILE: 37 M admitted 11/04 with AMS, acute respiratory failure, possible seizures, recent C diff infection, AKI on CKD. Hospitalization c/b HAP, parapneumonic effusion, failed self-extubation X 2   MAJOR EVENTS/TEST RESULTS: 11/4 intubated 11/5 failed vent wean due to encephalopathy and acidosis 11/6 RIJ CVL, R Fem Vein VasCath 11/8 bronchoscopy with BAL 11/8 thoracentesis, 1800 cc removed.  11/9 failed self extubation 11/15 transferred back to ICU 11/15 intubated 11/18 Thoracentesis - 1800 cc serosanguinous fluid removed. Exudative chemistries. Cultures negative 11/21 thoracentesis - loculations on [place]. 35 cc serosanguinous fluid removed. 11/21 CT chest: IMPRESSION: Large left pleural effusion is associated with compressive atelectasis of much of the left lung. Endobronchial lesion in the left lower lobe bronchus is not excluded. 11/21 L chest tube placed 11/22 Extubated. tPA to L chest tube 11/23 Repeat CT chest: Decrease in volume of loculated left-sided pleural effusion with chest tube in place. 11/23 chest tube removed  INDWELLING DEVICES:: ETT 11/04 >> 11/09, 11/09 >> out, 11/15 >> 11/22 R IJ CVL 11/06 >> 11/16 R femoral HD cath 11/06 >> 11/16 LUE PICC 11/16 >>   MICRO DATA: Results for orders placed or performed during the hospital encounter of 08/21/15  Blood Culture (routine x 2)     Status: None   Collection Time: 08/21/15 12:47 PM  Result Value Ref Range Status   Specimen Description BLOOD UNKNOWN  Final   Special Requests BOTTLES DRAWN AEROBIC AND ANAEROBIC  1CC  Final   Culture NO GROWTH 10 DAYS  Final   Report Status 08/31/2015 FINAL  Final  Blood Culture (routine x 2)     Status: None   Collection Time: 08/21/15 12:47 PM  Result Value Ref Range Status   Specimen Description BLOOD LEFT AC  Final    Special Requests BOTTLES DRAWN AEROBIC AND ANAEROBIC  6CC  Final   Culture NO GROWTH 10 DAYS  Final   Report Status 08/31/2015 FINAL  Final  Urine culture     Status: None   Collection Time: 08/21/15 12:47 PM  Result Value Ref Range Status   Specimen Description URINE, CATHETERIZED  Final   Special Requests Normal  Final   Culture NO GROWTH 2 DAYS  Final   Report Status 08/23/2015 FINAL  Final  MRSA PCR Screening     Status: None   Collection Time: 08/21/15 11:47 PM  Result Value Ref Range Status   MRSA by PCR NEGATIVE NEGATIVE Final    Comment:        The GeneXpert MRSA Assay (FDA approved for NASAL specimens only), is one component of a comprehensive MRSA colonization surveillance program. It is not intended to diagnose MRSA infection nor to guide or monitor treatment for MRSA infections.   C difficile quick scan w PCR reflex     Status: Abnormal   Collection Time: 08/22/15  2:31 PM  Result Value Ref Range Status   C Diff antigen POSITIVE (A) NEGATIVE Final   C Diff toxin POSITIVE (A) NEGATIVE Final   C Diff interpretation   Final    Positive for toxigenic C. difficile, active toxin production present.    Comment: CRITICAL RESULT CALLED TO, READ BACK BY AND VERIFIED WITH: CHARLIE FLEETWOOD,RN 08/22/2015 1544 BY J RAZZAKSUAREZ,MT   Culture, bal-quantitative     Status: None   Collection Time: 08/25/15  3:15 PM  Result Value Ref Range Status   Specimen Description BRONCHIAL ALVEOLAR LAVAGE  Final   Special Requests NONE  Final   Gram Stain   Final    GOOD SPECIMEN - 80-90% WBCS FEW WBC SEEN NO ORGANISMS SEEN    Culture HOLDING FOR POSSIBLE PATHOGEN  Final   Report Status 08/27/2015 FINAL  Final  Culture, blood (routine x 2)     Status: None   Collection Time: 09/02/15 12:00 PM  Result Value Ref Range Status   Specimen Description BLOOD RIGHT GROIN  Final   Special Requests   Final    BOTTLES DRAWN AEROBIC AND ANAEROBIC  6CC AERO 10CC ANAERO   Culture NO GROWTH 7  DAYS  Final   Report Status 09/09/2015 FINAL  Final  Culture, blood (routine x 2)     Status: None   Collection Time: 09/02/15 12:15 PM  Result Value Ref Range Status   Specimen Description BLOOD RIGHT GROIN  Final   Special Requests BOTTLES DRAWN AEROBIC AND ANAEROBIC  10 CC  Final   Culture NO GROWTH 7 DAYS  Final   Report Status 09/09/2015 FINAL  Final  Culture, blood (routine x 2)     Status: None   Collection Time: 09/02/15  1:47 PM  Result Value Ref Range Status   Specimen Description BLOOD RIGHT HAND  Final   Special Requests BOTTLES DRAWN AEROBIC AND ANAEROBIC 4CC  Final   Culture NO GROWTH 7 DAYS  Final   Report Status 09/09/2015 FINAL  Final  Culture, blood (routine x 2)     Status: None   Collection Time: 09/02/15  2:06 PM  Result Value Ref Range Status   Specimen Description BLOOD LEFT HAND  Final   Special Requests BOTTLES DRAWN AEROBIC AND ANAEROBIC 1CC  Final   Culture NO GROWTH 7 DAYS  Final   Report Status 09/09/2015 FINAL  Final  MRSA PCR Screening     Status: None   Collection Time: 09/02/15 10:27 PM  Result Value Ref Range Status   MRSA by PCR NEGATIVE NEGATIVE Final    Comment:        The GeneXpert MRSA Assay (FDA approved for NASAL specimens only), is one component of a comprehensive MRSA colonization surveillance program. It is not intended to diagnose MRSA infection nor to guide or monitor treatment for MRSA infections.   Culture, expectorated sputum-assessment     Status: None   Collection Time: 09/03/15 12:18 AM  Result Value Ref Range Status   Specimen Description SPUTUM  Final   Special Requests Normal  Final   Sputum evaluation THIS SPECIMEN IS ACCEPTABLE FOR SPUTUM CULTURE  Final   Report Status 09/03/2015 FINAL  Final  Culture, respiratory (NON-Expectorated)     Status: None   Collection Time: 09/03/15 12:18 AM  Result Value Ref Range Status   Specimen Description SPUTUM  Final   Special Requests Normal Reflexed from [id number]  Final    Gram Stain   Final    EXCELLENT SPECIMEN - 90-100% WBCS MANY WBC SEEN MANY GRAM NEGATIVE COCCOBACILLI    Culture MODERATE GROWTH CITROBACTER KOSERI  Final   Report Status 09/05/2015 FINAL  Final   Organism ID, Bacteria CITROBACTER KOSERI  Final      Susceptibility   Citrobacter koseri - MIC*    CEFAZOLIN <=4 SENSITIVE Sensitive     CEFTRIAXONE <=1 SENSITIVE Sensitive     CIPROFLOXACIN <=0.25 SENSITIVE Sensitive     GENTAMICIN <=1 SENSITIVE Sensitive  IMIPENEM <=0.25 SENSITIVE Sensitive     TRIMETH/SULFA <=20 SENSITIVE Sensitive     PIP/TAZO Value in next row Sensitive      SENSITIVE<=4    LEVOFLOXACIN Value in next row Sensitive      SENSITIVE<=0.12    * MODERATE GROWTH CITROBACTER KOSERI  Body fluid culture     Status: None   Collection Time: 09/04/15 10:30 AM  Result Value Ref Range Status   Specimen Description CYTO PLEU  Final   Special Requests NONE  Final   Gram Stain   Final    MODERATE WBC SEEN TOO NUMEROUS TO COUNT RED BLOOD CELLS NO ORGANISMS SEEN    Culture No growth aerobically or anaerobically.  Final   Report Status 09/08/2015 FINAL  Final  Body fluid culture     Status: None (Preliminary result)   Collection Time: 09/07/15 10:55 AM  Result Value Ref Range Status   Specimen Description CYTO PLEU  Final   Special Requests NONE  Final   Gram Stain FEW WBC SEEN NO ORGANISMS SEEN   Final   Culture NO GROWTH 2 DAYS  Final   Report Status PENDING  Incomplete     ANTIMICROBIALS:  Anti-infectives    Start     Dose/Rate Route Frequency Ordered Stop   09/05/15 1800  levofloxacin (LEVAQUIN) IVPB 750 mg     750 mg 100 mL/hr over 90 Minutes Intravenous Every 48 hours 09/05/15 1544     09/04/15 1200  vancomycin (VANCOCIN) IVPB 1000 mg/200 mL premix  Status:  Discontinued     1,000 mg 200 mL/hr over 60 Minutes Intravenous Every M-W-F (Hemodialysis) 09/02/15 1418 09/04/15 1059   09/03/15 2000  anidulafungin (ERAXIS) 100 mg in sodium chloride 0.9 % 100 mL IVPB   Status:  Discontinued     100 mg over 90 Minutes Intravenous Every 24 hours 09/02/15 1620 09/07/15 1013   09/02/15 2000  anidulafungin (ERAXIS) 200 mg in sodium chloride 0.9 % 200 mL IVPB     200 mg over 180 Minutes Intravenous  Once 09/02/15 1812 09/02/15 2307   09/02/15 1630  anidulafungin (ERAXIS) 200 mg in sodium chloride 0.9 % 200 mL IVPB  Status:  Discontinued     200 mg over 180 Minutes Intravenous  Once 09/02/15 1620 09/02/15 2132   09/02/15 1600  piperacillin-tazobactam (ZOSYN) IVPB 3.375 g  Status:  Discontinued     3.375 g 12.5 mL/hr over 240 Minutes Intravenous Every 12 hours 09/02/15 1053 09/02/15 1307   09/02/15 1430  vancomycin (VANCOCIN) 2,000 mg in sodium chloride 0.9 % 500 mL IVPB     2,000 mg 250 mL/hr over 120 Minutes Intravenous  Once 09/02/15 1416 09/02/15 1820   09/02/15 1315  piperacillin-tazobactam (ZOSYN) IVPB 3.375 g  Status:  Discontinued     3.375 g 12.5 mL/hr over 240 Minutes Intravenous Every 12 hours 09/02/15 1307 09/05/15 1447   08/29/15 1700  vancomycin (VANCOCIN) IVPB 1000 mg/200 mL premix  Status:  Discontinued     1,000 mg 200 mL/hr over 60 Minutes Intravenous every 72 hours 08/26/15 1555 08/27/15 1023   08/27/15 1000  meropenem (MERREM) 1 g in sodium chloride 0.9 % 100 mL IVPB  Status:  Discontinued     1 g 200 mL/hr over 30 Minutes Intravenous Every 24 hours 08/26/15 1555 08/27/15 1023   08/26/15 1800  vancomycin (VANCOCIN) 500 mg in sodium chloride irrigation 0.9 % 100 mL ENEMA  Status:  Discontinued     500 mg Rectal 4 times  per day 08/26/15 1531 08/27/15 1023   08/26/15 1700  vancomycin (VANCOCIN) IVPB 1000 mg/200 mL premix     1,000 mg 200 mL/hr over 60 Minutes Intravenous  Once 08/26/15 1555 08/26/15 1723   08/25/15 1600  meropenem (MERREM) 1 g in sodium chloride 0.9 % 100 mL IVPB  Status:  Discontinued     1 g 200 mL/hr over 30 Minutes Intravenous Every 12 hours 08/25/15 1545 08/26/15 1555   08/25/15 1600  vancomycin (VANCOCIN) IVPB 1000  mg/200 mL premix  Status:  Discontinued     1,000 mg 200 mL/hr over 60 Minutes Intravenous Every 24 hours 08/25/15 1545 08/26/15 1555   08/24/15 1200  vancomycin (VANCOCIN) 50 mg/mL oral solution 125 mg  Status:  Discontinued     125 mg Oral 4 times per day 08/24/15 1035 09/05/15 1447   08/22/15 2000  vancomycin (VANCOCIN) IVPB 1000 mg/200 mL premix  Status:  Discontinued     1,000 mg 200 mL/hr over 60 Minutes Intravenous Every 24 hours 08/21/15 1439 08/22/15 0956   08/22/15 1800  vancomycin (VANCOCIN) 50 mg/mL oral solution 500 mg  Status:  Discontinued     500 mg Oral 4 times per day 08/22/15 1547 08/24/15 1035   08/22/15 1600  vancomycin (VANCOCIN) IVPB 1000 mg/200 mL premix  Status:  Discontinued     1,000 mg 200 mL/hr over 60 Minutes Intravenous Every 18 hours 08/22/15 0956 08/22/15 1001   08/22/15 1600  vancomycin (VANCOCIN) 1,250 mg in sodium chloride 0.9 % 250 mL IVPB  Status:  Discontinued     1,250 mg 166.7 mL/hr over 90 Minutes Intravenous Every 18 hours 08/22/15 1001 08/23/15 0827   08/22/15 1200  vancomycin (VANCOCIN) 50 mg/mL oral solution 125 mg  Status:  Discontinued     125 mg Oral 4 times per day 08/22/15 1035 08/22/15 1550   08/21/15 2200  piperacillin-tazobactam (ZOSYN) IVPB 3.375 g  Status:  Discontinued     3.375 g 12.5 mL/hr over 240 Minutes Intravenous 3 times per day 08/21/15 1431 08/23/15 0827   08/21/15 2000  vancomycin (VANCOCIN) IVPB 1000 mg/200 mL premix     1,000 mg 200 mL/hr over 60 Minutes Intravenous  Once 08/21/15 1439 08/21/15 2248   08/21/15 1300  piperacillin-tazobactam (ZOSYN) IVPB 3.375 g     3.375 g 100 mL/hr over 30 Minutes Intravenous  Once 08/21/15 1245 08/21/15 1457   08/21/15 1300  vancomycin (VANCOCIN) IVPB 1000 mg/200 mL premix     1,000 mg 200 mL/hr over 60 Minutes Intravenous  Once 08/21/15 1245 08/21/15 1457       SUBJECTIVE:  RASS 0, + F/C. Has tolerated extubation well. Presently on RA. Intermittent agitation  VITAL SIGNS: BP  145/122 mmHg  Pulse 92  Temp(Src) 99.5 F (37.5 C) (Axillary)  Resp 20  Ht 5\' 11"  (1.803 m)  Wt 104.2 kg (229 lb 11.5 oz)  BMI 32.05 kg/m2  SpO2 100%  HEMODYNAMICS:    VENTILATOR SETTINGS:    INTAKE / OUTPUT: I/O last 3 completed shifts: In: 1108.3 [I.V.:1108.3] Out: 6070 [Urine:4200; Stool:800; Chest Tube:1070]  PHYSICAL EXAMINATION: General: RASS 0, + F/C Neuro: CNs intact, MAEs HEENT: NCAT, WNL Cardiovascular: reg, no M Lungs:diminished on L Abdomen: soft, NT, +BS Ext: warm, no edema  LABS:  CBC  Recent Labs Lab 09/06/15 0600 09/07/15 0447 09/08/15 0450  WBC 7.7 7.1 7.8  HGB 9.0* 8.7* 9.4*  HCT 28.0* 27.3* 29.5*  PLT 250 247 259   Coag's No results for input(s):  APTT, INR in the last 168 hours. BMET  Recent Labs Lab 09/07/15 0447 09/08/15 0450 09/09/15 0500  NA 146* 146* 147*  K 4.9 5.0 4.0  CL 116* 116* 117*  CO2 23 23 20*  BUN 62* 59* 55*  CREATININE 4.19* 3.82* 3.67*  GLUCOSE 104* 129* 113*   Electrolytes  Recent Labs Lab 09/07/15 0447 09/08/15 0450 09/09/15 0500  CALCIUM 9.0 9.3 9.2   Sepsis Markers No results for input(s): LATICACIDVEN, PROCALCITON, O2SATVEN in the last 168 hours. ABG  Recent Labs Lab 09/02/15 1555 09/06/15 0400 09/07/15 0414  PHART 7.29* 7.32* 7.30*  PCO2ART 46 44 45  PO2ART 126* 137* 165*   Liver Enzymes  Recent Labs Lab 09/05/15 0507 09/08/15 0450  AST 40 40  ALT 49 34  ALKPHOS 208* 446*  BILITOT 0.8 0.5  ALBUMIN 1.8* 1.8*   Cardiac Enzymes No results for input(s): TROPONINI, PROBNP in the last 168 hours. Glucose  Recent Labs Lab 09/08/15 1623 09/08/15 1942 09/08/15 2346 09/09/15 0355 09/09/15 0712 09/09/15 1142  GLUCAP 112* 100* 103* 102* 111* 95    Imaging Ct Chest Wo Contrast  09/09/2015  CLINICAL DATA:  Followup pleural effusion EXAM: CT CHEST WITHOUT CONTRAST TECHNIQUE: Multidetector CT imaging of the chest was performed following the standard protocol without IV contrast.  COMPARISON:  09/07/2015 FINDINGS: Mediastinum: The heart size appears normal. There is aortic atherosclerosis noted. Calcification within the LAD coronary artery noted. The trachea is patent and is midline. Normal appearance of the esophagus. No axillary or supraclavicular adenopathy. Lungs/Pleura: There is a left-sided chest tube in place. Loculated left-sided hydro pneumothorax is identified and appears significantly decreased in volume from previous exam. There is compressive type atelectasis and consolidation involving portions of the lingula and left lower lobe. No right pleural effusion. There is a pulmonary nodule within the superior segment of the right lower lobe measuring 1 cm, image 23 of series 3. This is unchanged from 09/07/2015. Upper Abdomen: The adrenal glands are normal. The visualized portions of the liver and spleen are normal. 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 Musculoskeletal: No aggressive lytic or sclerotic bone lesions identified. IMPRESSION: 1. Decrease in volume of loculated left-sided hydropneumothorax with chest tube in place. 2. Persistent 1 cm nodule in the right lower lobe. Consider followup imaging with nonemergent, outpatient PET-CT. Electronically Signed   By: Signa Kell M.D.   On: 09/09/2015 09:59   Dg Chest Port 1 View  09/09/2015  CLINICAL DATA:  Respiratory failure. EXAM: PORTABLE CHEST 1 VIEW COMPARISON:  09/08/2015.  CT 09/07/2015. FINDINGS: Left PICC line in stable position. Left chest tube in stable position. No pneumothorax. Stable cardiomegaly. Persistent left lower lobe infiltrate and left pleural effusion present. IMPRESSION: 1. Left chest tube in stable position.  No pneumothorax. 2. Persistent left base infiltrate and left pleural effusion. No interim change. Electronically Signed   By: Maisie Fus  Register   On: 09/09/2015 07:51     ASSESSMENT / PLAN:  PULMONARY A: Prolonged VDRF - extubated 11/22 and tolerating HAP  with large parapneumonic effusion - chest tube placed 11/21, removed 11/23 P:   Monitor in SDU today. Consider transfer out 11/24 Supplemental O2 to maintain SpO2 > 92% Recheck CXR AM 11/24 after chest tube removal  CARDIOVASCULAR A:  No acute issues P:  Monitor hemodynamics  RENAL A:   AKI, nonoliguric - Cr improving CKD P:   Monitor BMET intermittently Monitor I/Os Correct electrolytes as indicated Renal service following  GASTROINTESTINAL A:  Post extubation dysphagia - passed SLP eval 11/23  P:   SUP: N/I post extubation Begin diet 11/23  HEMATOLOGIC A:   Mild anemia without overt bleeding P:  DVT px: SQ heparin Monitor CBC intermittently Transfuse per usual ICU guidelines  INFECTIOUS A:   C diff colitis, fully treated Citrobacter HAP Complicated parapneumonic effusion Severe sepsis, resolving P:   Monitor temp, WBC count Micro and abx as above Cont levofloxacin through 11/26 per ID recs  ENDOCRINE A:   No issues P:   Monitor glu on chem panels Consider SSI for glu > 180  NEUROLOGIC A:   H/O schizophrenia ? Of seizures - Neuro has seen. Recs no ACDs presently H/O EtOH abuse Agitation, controlled Pain, controlled P:   RASS goal: 0 Cont PRN fentanyl Cont PRN haldol  Discussed with Dr Valeria BatmanShah  David Simonds, MD PCCM service Mobile [phone] Pager [phone]    09/09/2015, 12:48 PM

## 2015-09-09 NOTE — Evaluation (Signed)
Physical Therapy Evaluation Patient Details Name: Ross [name] MRN: [id number] DOB: 06-11-1966 Today's Date: 09/09/2015   History of Present Illness  presented to ER after being found unresponsive and noted to have seizure-like activity, intubated in ED due to respiratory failure with hypoxia; admitted with sepsis (likely related to c-diff) with multi-organ failure.  Noted with mild elevation in troponin, likely secondary to rhabdomyolysis.  Hospital course additionally significant for placement of R temp femoral dialysis catheter for HD (now removed); self-extubated on 11/9 and transferred to gen medical floor.  Experienced progressive respiratory distress requiring return to CCU and reintubation 11/15-11/22.  Status post thoracentesis 11/18 and 11/21 and placement of L CT 11/21 (removed 11/23).  PT now re-consulted for continued services as medical status improving.  Clinical Impression  Upon evaluation, patient alert and aware of therapist; appropriately responds to and follows simple one-step commands with increased time for processing.  Makes attempts to verbalize to therapist, but speech very effortful and hypophonic (though fluent and appropriate).  Patient with significant increase in level of functional weakness and decrease in activity tolerance compared to initial evaluation, demonstrating at 3-/5 strength at best in all extremities.  Appears to demonstrate some degree of hyper-reflexia in bilat LEs with multi-beat, sustained clonus bilat ankles, + babinski R LE; discussed potential benefit of neuro consult with primary RN (given neuro signs above and overall change in status/alertness from initial evaluation).  Unsafe/unable to tolerate attempts at unsupported sitting or OOB at this time.  Will continue to progress (goal of sitting edge of bed next session) in subsequent sessions. Would benefit from skilled PT to address above deficits and promote optimal return to PLOF; recommend  transition to CIR upon discharge, feel patient would benefit significantly from high-intensity, post-acute rehab given age, PLOF, motivation.    Follow Up Recommendations CIR    Equipment Recommendations       Recommendations for Other Services       Precautions / Restrictions Precautions Precautions: Fall Precaution Comments: Enteric isolation, flexiseal Restrictions Weight Bearing Restrictions: No      Mobility  Bed Mobility               General bed mobility comments: currently dep for all positioning in bed; unable to tolerate unsupported or OOB attempts this date  Transfers                 General transfer comment: unable to tolerate at this time due to fatigue/lethargy  Ambulation/Gait             General Gait Details: unable to tolerate at this time due to fatigue/lethargy  Stairs            Wheelchair Mobility    Modified Rankin (Stroke Patients Only)       Balance                                             Pertinent Vitals/Pain Pain Assessment: Faces Pain Score: 4  Pain Location: L LE Pain Descriptors / Indicators: Aching;Grimacing Pain Intervention(s): Monitored during session;Repositioned;Limited activity within patient's tolerance    Home Living Family/patient expects to be discharged to:: Skilled nursing facility Living Arrangements: Other (Comment)   Type of Home: Apartment Home Access: Level entry     Home Layout: One level   Additional Comments: patient unreliable historian/unable to elaborate this date, will verify with  family as available    Prior Function Level of Independence: Independent with assistive device(s)         Comments: Per patient report, mod indep with use of SPC; does endorse intermittent fall history.  Questionable historian.     Hand Dominance        Extremity/Trunk Assessment   Upper Extremity Assessment: Generalized weakness (globally weak and deconditioned,  strength at least 3-/5)           Lower Extremity Assessment: Generalized weakness (globally weak and deconditioned, strength at least 3-/5; + multi-beat, sustained clonus bilat ankles; + babinski R LE)         Communication   Communication:  (speech very delayed and hypophonic)  Cognition Arousal/Alertness: Awake/alert Behavior During Therapy: WFL for tasks assessed/performed Overall Cognitive Status:  (difficult to fully assess, though notably less responsive, more delayed than on initial evaluation.)                      General Comments      Exercises Other Exercises Other Exercises: Bilat LE supine therex, 1x5-8, act assist ROM for muscular strength/endurance with functional activities.  Frequent cuing/redirection to task for alertness and attention. Other Exercises: repositioned to neutral alignment in bed, HOB elevated to promote improved pulmonary hygiene      Assessment/Plan    PT Assessment Patient needs continued PT services  PT Diagnosis Difficulty walking;Generalized weakness;Altered mental status   PT Problem List Decreased range of motion;Decreased activity tolerance;Decreased balance;Decreased mobility;Decreased cognition;Decreased knowledge of use of DME;Decreased safety awareness;Decreased knowledge of precautions;Cardiopulmonary status limiting activity;Pain  PT Treatment Interventions Gait training;DME instruction;Stair training;Functional mobility training;Therapeutic activities;Therapeutic exercise;Balance training;Cognitive remediation;Patient/family education;Neuromuscular re-education   PT Goals (Current goals can be found in the Care Plan section) Acute Rehab PT Goals PT Goal Formulation: Patient unable to participate in goal setting Time For Goal Achievement: 09/23/15 Potential to Achieve Goals: Good    Frequency Min 2X/week (will plan to increase frequency to QD next session if medically appropriate)   Barriers to discharge Decreased  caregiver support      Co-evaluation               End of Session   Activity Tolerance: Patient limited by fatigue Patient left: in bed;with call bell/phone within reach Nurse Communication:  (possible benefit of neuro consult)         Time: 1610-9604 PT Time Calculation (min) (ACUTE ONLY): 17 min   Charges:   PT Evaluation $PT Re-evaluation: 1 Procedure PT Treatments $Therapeutic Exercise: 8-22 mins   PT G Codes:       Trever Streater H. Manson Passey, PT, DPT, NCS 09/09/2015, 3:27 PM [phone]

## 2015-09-09 NOTE — Progress Notes (Addendum)
Nutrition Follow-up    INTERVENTION:   Medical Food Supplement Therapy: recommend addition of nutritional supplement; MD Thedore MinsSingh requesting that pt receives Nepro at this time but ok to change supplement in the next few days if needed. Will send TID with meals Meals and Snacks: Cater to patient preferences; if pt po intake inadequate, recommend liberalizing diet   NUTRITION DIAGNOSIS:   Inadequate oral intake related to acute illness as evidenced by NPO status.  Being addressed as diet advanced post extubation, adding supplement  GOAL:   Patient will meet greater than or equal to 90% of their needs  MONITOR:    (Energy intake, Digestive system, Electrolyte and renal profile)  REASON FOR ASSESSMENT:   Consult Enteral/tube feeding initiation and management  ASSESSMENT:    CT chest this AM, plan to remove chest tube  Diet Order:  DIET DYS 2 Room service appropriate?: Yes with Assist; Fluid consistency:: Thin ; Heart Healthy, Carb Modified  Energy Intake: pt has not received full meal tray but ate some applesauce and graham crackers with SLP this AM  Digestive System: rectal tube in place, Cdiff positive  Electrolyte and Renal Profile:  Recent Labs Lab 09/07/15 0447 09/08/15 0450 09/09/15 0500  BUN 62* 59* 55*  CREATININE 4.19* 3.82* 3.67*  NA 146* 146* 147*  K 4.9 5.0 4.0   Glucose Profile:  Recent Labs  09/08/15 2346 09/09/15 0355 09/09/15 0712  GLUCAP 103* 102* 111*   Meds: D5 at 100 ml/hr  Height:   Ht Readings from Last 1 Encounters:  08/21/15 5\' 11"  (1.803 m)    Weight:   Wt Readings from Last 1 Encounters:  09/09/15 229 lb 11.5 oz (104.2 kg)   Filed Weights   09/07/15 0638 09/08/15 0500 09/09/15 0415  Weight: 245 lb 6 oz (111.3 kg) 245 lb 13 oz (111.5 kg) 229 lb 11.5 oz (104.2 kg)     BMI:  Body mass index is 32.05 kg/(m^2).  Estimated Nutritional Needs:   Kcal:  BEE 1677 kcals (IF 1.1-1.4, AF 1.2) 2213-2817 kcals/d. Using IBW of  78kg  Protein:  (1.2-1.5 g/kg) 94-117 g/d Using IBW of 78kg  Fluid:  (25-9930ml/kg) 1950-236040ml/d Using IBw of 78kg  EDUCATION NEEDS:   No education needs identified at this time  HIGH Care Level  Romelle Starcherate Marquest Gunkel MS, RD, LDN [phone] Pager

## 2015-09-09 NOTE — Progress Notes (Addendum)
[facility] [place] [facility]   PATIENT NAME: Ross [name]    MR#:  [id number]  DATE OF BIRTH:  [birth year]    CHIEF COMPLAINT:   Chief Complaint  Patient presents with  . Seizures  remains extubated, trying to get OOB, requesting some water. Overall seem to be improving.  REVIEW OF SYSTEMS:   ROS unable to obtain as patient is not able to speak  DRUG ALLERGIES:   Allergies  Allergen Reactions  . Antihistamines, Diphenhydramine-Type Anaphylaxis and Rash  . Benadryl [Diphenhydramine Hcl] Anaphylaxis and Rash  . Hydroxyzine Anaphylaxis and Rash    VITALS:  Blood pressure 158/97, pulse 97, temperature 99.5 F (37.5 C), temperature source Axillary, resp. rate 34, height 5\' 11"  (1.803 m), weight 104.2 kg (229 lb 11.5 oz), SpO2 97 %.  PHYSICAL EXAMINATION:  GENERAL:  49 y.o.-year-old patient recently extubated, weak, short shallow breaths HEENT: Pupils equal round and reactive, oral mucous membranes are moist, orally intubated Pulmonary: Short shallow breaths, good air movement, upper airway congestion, no wheezing  CARDIOVASCULAR: S1, S2 normal. No murmurs, rubs, or gallops. ABDOMEN: Soft, nontender, nondistended. Bowel sounds present. No organomegaly or mass.  EXTREMITIES: +1 edema bilaterally edema. No cyanosis, or clubbing. Peripheral pulses 2+ NEUROLOGIC: Cranial nerves grossly intact, moving all 4 extremities, responds to simple commands, SKIN: No obvious rash, lesion, or ulcer.  Psych: Lethargic   LABORATORY PANEL:   CBC  Recent Labs Lab 09/08/15 0450  WBC 7.8  HGB 9.4*  HCT 29.5*  PLT 259   ------------------------------------------------------------------------------------------------------------------  Chemistries   Recent Labs Lab 09/08/15 0450 09/09/15 0500  NA 146* 147*  K 5.0 4.0  CL 116* 117*  CO2 23 20*  GLUCOSE 129* 113*  BUN 59* 55*  CREATININE 3.82* 3.67*  CALCIUM 9.3 9.2  AST 40  --   ALT 34  --    ALKPHOS 446*  --   BILITOT 0.5  --    ------------------------------------------------------------------------------------------------------------------  Cardiac Enzymes No results for input(s): TROPONINI in the last 168 hours. ------------------------------------------------------------------------------------------------------------------  RADIOLOGY:  Ct Chest Wo Contrast  09/09/2015  CLINICAL DATA:  Followup pleural effusion EXAM: CT CHEST WITHOUT CONTRAST TECHNIQUE: Multidetector CT imaging of the chest was performed following the standard protocol without IV contrast. COMPARISON:  09/07/2015 FINDINGS: Mediastinum: The heart size appears normal. There is aortic atherosclerosis noted. Calcification within the LAD coronary artery noted. The trachea is patent and is midline. Normal appearance of the esophagus. No axillary or supraclavicular adenopathy. Lungs/Pleura: There is a left-sided chest tube in place. Loculated left-sided hydro pneumothorax is identified and appears significantly decreased in volume from previous exam. There is compressive type atelectasis and consolidation involving portions of the lingula and left lower lobe. No right pleural effusion. There is a pulmonary nodule within the superior segment of the right lower lobe measuring 1 cm, image 23 of series 3. This is unchanged from 09/07/2015. Upper Abdomen: The adrenal glands are normal. The visualized portions of the liver and spleen are normal. 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 Musculoskeletal: No aggressive lytic or sclerotic bone lesions identified. IMPRESSION: 1. Decrease in volume of loculated left-sided hydropneumothorax with chest tube in place. 2. Persistent 1 cm nodule in the right lower lobe. Consider followup imaging with nonemergent, outpatient PET-CT. Electronically Signed   By: Signa Kellaylor  Stroud M.D.   On: 09/09/2015 09:59   Dg Chest Port 1 View  09/09/2015  CLINICAL DATA:   Respiratory failure. EXAM: PORTABLE CHEST 1 VIEW  COMPARISON:  09/08/2015.  CT 09/07/2015. FINDINGS: Left PICC line in stable position. Left chest tube in stable position. No pneumothorax. Stable cardiomegaly. Persistent left lower lobe infiltrate and left pleural effusion present. IMPRESSION: 1. Left chest tube in stable position.  No pneumothorax. 2. Persistent left base infiltrate and left pleural effusion. No interim change. Electronically Signed   By: Maisie Fus  Register   On: 09/09/2015 07:51   Dg Chest Port 1 View  09/08/2015  CLINICAL DATA:  Hypoxia/respiratory distress EXAM: PORTABLE CHEST 1 VIEW COMPARISON:  September 07, 2015 FINDINGS: Endotracheal tube tip is 4.2 cm above the carina. Central catheter tip is in the superior vena cava near the cavoatrial junction. There is a chest tube on the left. Nasogastric tube tip and side port are below the diaphragm. No pneumothorax. There is loculated effusion on the left with left lower lobe airspace consolidation. Right lung is clear. Heart is upper normal in size with pulmonary vascularity within normal limits. No adenopathy. IMPRESSION: Tube and catheter positions as described without pneumothorax. Persistent effusion on the left with left lower lobe consolidation. No new opacity. Right lung clear. No change in cardiac silhouette. Electronically Signed   By: Bretta Bang III M.D.   On: 09/08/2015 07:25    EKG:   Orders placed or performed during the hospital encounter of 08/21/15  . EKG 12-Lead  . EKG 12-Lead    ASSESSMENT AND PLAN:   * Hypernatremia: Na slowly creeping up (147 Today), will try gentle fluid (d5w) and recheck Na in am.  * acute respiratory failure with hypoxia: (VDRF) - Pulmonary CCM following - Intubated 11/4 through 11/9 and then re-intubated 11/15 extubated 11/22 - pneumonia pleural effusion, thoracentesis 11/18 with removal of 1.8 L of fluid, exudative effusion, reaccumulation and chest tube placed today 11/21 - Sputum  culture growing Citrobacter covering with Levaquin stop date 11/26 - Pleural blood culture pending  * HAP with large parapneumonic effusion - chest tube placed 11/21, removed 11/23 Supplemental O2 to maintain SpO2 > 92% Recheck CXR AM 11/24 after chest tube removal per PCCM  * Septic shock secondary to Hospital Acquired pneumonia and also possible line sepsis: See above.  - Left neck line removed, right groin hemodialysis catheter removed - Antifungals discontinued 11/21 - Off levo fed since 11/19   * Rhabdomyolysis: Resolved - Possibly due to prolonged immobilization, hypotension, diarrhea, possible withdrawal seizures prior to admission.   * acute renal failure: secondary to ATN from one depletion, rhabdomyolysis, C. Difficile. Good urine output. No need for hemodialysis at this time. 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 Albumin very low. Continue to monitor  * left leg edema;dvt study is negative.  * anxiety, mental disorder with PTSD: .schizophrenia;consult psych when extubated.  -> Disposition. Care management has been discussing possible LTAC placement with the family. Currently the family is in favor of him remaining here.  CODE STATUS: Full   TOTAL TIME TAKING CARE OF THIS PATIENT: 35 minutes.    POSSIBLE D/C  ? DAYS, DEPENDING ON CLINICAL CONDITION. I'm ok with him moving out tomorrow to floors if PCCM is ok. D/w Dr Thurston Pounds, Dane Bloch M.D on 09/09/2015 at 5:07 PM  Between 7am to 6pm - Pager - [phone]  After 6pm go to [email or web address] - password [email or web address] [facility]  [facility]  Office  [phone]  CC: Primary care physician; No primary care provider on file.

## 2015-09-10 ENCOUNTER — Inpatient Hospital Stay: Payer: Medicare Other

## 2015-09-10 DIAGNOSIS — R400 Somnolence: Secondary | ICD-10-CM

## 2015-09-10 LAB — BASIC METABOLIC PANEL
ANION GAP: 9 (ref 5–15)
BUN: 47 mg/dL — ABNORMAL HIGH (ref 6–20)
CO2: 20 mmol/L — ABNORMAL LOW (ref 22–32)
Calcium: 9.1 mg/dL (ref 8.9–10.3)
Chloride: 115 mmol/L — ABNORMAL HIGH (ref 101–111)
Creatinine, Ser: 3.51 mg/dL — ABNORMAL HIGH (ref 0.61–1.24)
GFR, EST AFRICAN AMERICAN: 22 mL/min — AB (ref >60)
GFR, EST NON AFRICAN AMERICAN: 19 mL/min — AB (ref >60)
GLUCOSE: 105 mg/dL — AB (ref 65–99)
POTASSIUM: 3.7 mmol/L (ref 3.5–5.1)
Sodium: 144 mmol/L (ref 135–145)

## 2015-09-10 LAB — GLUCOSE, CAPILLARY
GLUCOSE-CAPILLARY: 99 mg/dL (ref 65–99)
GLUCOSE-CAPILLARY: 97 mg/dL (ref 65–99)
Glucose-Capillary: 96 mg/dL (ref 65–99)
Glucose-Capillary: 101 mg/dL — ABNORMAL HIGH (ref 65–99)

## 2015-09-10 LAB — CBC
HEMATOCRIT: 27.5 % — AB (ref 40.0–52.0)
HEMOGLOBIN: 9 g/dL — AB (ref 13.0–18.0)
MCH: 28.5 pg (ref 26.0–34.0)
MCHC: 32.9 g/dL (ref 32.0–36.0)
MCV: 86.9 fL (ref 80.0–100.0)
PLATELETS: 228 10*3/uL (ref 150–440)
RBC: 3.16 MIL/uL — AB (ref 4.40–5.90)
RDW: 13.8 % (ref 11.5–14.5)
WBC: 10.5 10*3/uL (ref 3.8–10.6)

## 2015-09-10 NOTE — Progress Notes (Signed)
ANTIBIOTIC CONSULT NOTE - follow up  Pharmacy Consult for Levaquin Indication: HCAP  Allergies  Allergen Reactions  . Antihistamines, Diphenhydramine-Type Anaphylaxis and Rash  . Benadryl [Diphenhydramine Hcl] Anaphylaxis and Rash  . Hydroxyzine Anaphylaxis and Rash    Patient Measurements: Height:  (180.3 cm) Weight: 234 lb 5.6 oz (106.3 kg) IBW/kg (Calculated) : 75.3  Vital Signs: Temp: 99.2 F (37.3 C) (11/24 0745) Temp Source: Oral (11/24 0745) BP: 152/86 mmHg (11/24 0700) Pulse Rate: 75 (11/24 0745) Intake/Output from previous day: 11/23 0701 - 11/24 0700 In: 2097.5 [I.V.:1947.5; IV Piggyback:150] Out: 1275 [Urine:700; Chest Tube:575] Intake/Output from this shift: Total I/O In: -  Out: 400 [Urine:400]  Labs:  Recent Labs  09/08/15 0450 09/09/15 0500 09/10/15 0430  WBC 7.8  --  10.5  HGB 9.4*  --  9.0*  PLT 259  --  228  CREATININE 3.82* 3.67* 3.51*   Estimated Creatinine Clearance: 32.3 mL/min (by C-G formula based on Cr of 3.51).  Microbiology: Recent Results (from the past 720 hour(s))  Blood Culture (routine x 2)     Status: None   Collection Time: 08/21/15 12:47 PM  Result Value Ref Range Status   Specimen Description BLOOD UNKNOWN  Final   Special Requests BOTTLES DRAWN AEROBIC AND ANAEROBIC  1CC  Final   Culture NO GROWTH 10 DAYS  Final   Report Status 08/31/2015 FINAL  Final  Blood Culture (routine x 2)     Status: None   Collection Time: 08/21/15 12:47 PM  Result Value Ref Range Status   Specimen Description BLOOD LEFT AC  Final   Special Requests BOTTLES DRAWN AEROBIC AND ANAEROBIC  6CC  Final   Culture NO GROWTH 10 DAYS  Final   Report Status 08/31/2015 FINAL  Final  Urine culture     Status: None   Collection Time: 08/21/15 12:47 PM  Result Value Ref Range Status   Specimen Description URINE, CATHETERIZED  Final   Special Requests Normal  Final   Culture NO GROWTH 2 DAYS  Final   Report Status 08/23/2015 FINAL  Final  MRSA  PCR Screening     Status: None   Collection Time: 08/21/15 11:47 PM  Result Value Ref Range Status   MRSA by PCR NEGATIVE NEGATIVE Final    Comment:        The GeneXpert MRSA Assay (FDA approved for NASAL specimens only), is one component of a comprehensive MRSA colonization surveillance program. It is not intended to diagnose MRSA infection nor to guide or monitor treatment for MRSA infections.   C difficile quick scan w PCR reflex     Status: Abnormal   Collection Time: 08/22/15  2:31 PM  Result Value Ref Range Status   C Diff antigen POSITIVE (A) NEGATIVE Final   C Diff toxin POSITIVE (A) NEGATIVE Final   C Diff interpretation   Final    Positive for toxigenic C. difficile, active toxin production present.    Comment: CRITICAL RESULT CALLED TO, READ BACK BY AND VERIFIED WITH: CHARLIE FLEETWOOD,RN 08/22/2015 1544 BY J RAZZAKSUAREZ,MT   Culture, bal-quantitative     Status: None   Collection Time: 08/25/15  3:15 PM  Result Value Ref Range Status   Specimen Description BRONCHIAL ALVEOLAR LAVAGE  Final   Special Requests NONE  Final   Gram Stain   Final    GOOD SPECIMEN - 80-90% WBCS FEW WBC SEEN NO ORGANISMS SEEN    Culture HOLDING FOR POSSIBLE PATHOGEN  Final  Report Status 08/27/2015 FINAL  Final  Culture, blood (routine x 2)     Status: None   Collection Time: 09/02/15 12:00 PM  Result Value Ref Range Status   Specimen Description BLOOD RIGHT GROIN  Final   Special Requests   Final    BOTTLES DRAWN AEROBIC AND ANAEROBIC  6CC AERO 10CC ANAERO   Culture NO GROWTH 7 DAYS  Final   Report Status 09/09/2015 FINAL  Final  Culture, blood (routine x 2)     Status: None   Collection Time: 09/02/15 12:15 PM  Result Value Ref Range Status   Specimen Description BLOOD RIGHT GROIN  Final   Special Requests BOTTLES DRAWN AEROBIC AND ANAEROBIC  10 CC  Final   Culture NO GROWTH 7 DAYS  Final   Report Status 09/09/2015 FINAL  Final  Culture, blood (routine x 2)     Status: None    Collection Time: 09/02/15  1:47 PM  Result Value Ref Range Status   Specimen Description BLOOD RIGHT HAND  Final   Special Requests BOTTLES DRAWN AEROBIC AND ANAEROBIC 4CC  Final   Culture NO GROWTH 7 DAYS  Final   Report Status 09/09/2015 FINAL  Final  Culture, blood (routine x 2)     Status: None   Collection Time: 09/02/15  2:06 PM  Result Value Ref Range Status   Specimen Description BLOOD LEFT HAND  Final   Special Requests BOTTLES DRAWN AEROBIC AND ANAEROBIC 1CC  Final   Culture NO GROWTH 7 DAYS  Final   Report Status 09/09/2015 FINAL  Final  MRSA PCR Screening     Status: None   Collection Time: 09/02/15 10:27 PM  Result Value Ref Range Status   MRSA by PCR NEGATIVE NEGATIVE Final    Comment:        The GeneXpert MRSA Assay (FDA approved for NASAL specimens only), is one component of a comprehensive MRSA colonization surveillance program. It is not intended to diagnose MRSA infection nor to guide or monitor treatment for MRSA infections.   Culture, expectorated sputum-assessment     Status: None   Collection Time: 09/03/15 12:18 AM  Result Value Ref Range Status   Specimen Description SPUTUM  Final   Special Requests Normal  Final   Sputum evaluation THIS SPECIMEN IS ACCEPTABLE FOR SPUTUM CULTURE  Final   Report Status 09/03/2015 FINAL  Final  Culture, respiratory (NON-Expectorated)     Status: None   Collection Time: 09/03/15 12:18 AM  Result Value Ref Range Status   Specimen Description SPUTUM  Final   Special Requests Normal Reflexed from [id number]  Final   Gram Stain   Final    EXCELLENT SPECIMEN - 90-100% WBCS MANY WBC SEEN MANY GRAM NEGATIVE COCCOBACILLI    Culture MODERATE GROWTH CITROBACTER KOSERI  Final   Report Status 09/05/2015 FINAL  Final   Organism ID, Bacteria CITROBACTER KOSERI  Final      Susceptibility   Citrobacter koseri - MIC*    CEFAZOLIN <=4 SENSITIVE Sensitive     CEFTRIAXONE <=1 SENSITIVE Sensitive     CIPROFLOXACIN <=0.25 SENSITIVE  Sensitive     GENTAMICIN <=1 SENSITIVE Sensitive     IMIPENEM <=0.25 SENSITIVE Sensitive     TRIMETH/SULFA <=20 SENSITIVE Sensitive     PIP/TAZO Value in next row Sensitive      SENSITIVE<=4    LEVOFLOXACIN Value in next row Sensitive      SENSITIVE<=0.12    * MODERATE GROWTH CITROBACTER KOSERI  Body fluid  culture     Status: None   Collection Time: 09/04/15 10:30 AM  Result Value Ref Range Status   Specimen Description CYTO PLEU  Final   Special Requests NONE  Final   Gram Stain   Final    MODERATE WBC SEEN TOO NUMEROUS TO COUNT RED BLOOD CELLS NO ORGANISMS SEEN    Culture No growth aerobically or anaerobically.  Final   Report Status 09/08/2015 FINAL  Final  Body fluid culture     Status: None (Preliminary result)   Collection Time: 09/07/15 10:55 AM  Result Value Ref Range Status   Specimen Description CYTO PLEU  Final   Special Requests NONE  Final   Gram Stain FEW WBC SEEN NO ORGANISMS SEEN   Final   Culture NO GROWTH 2 DAYS  Final   Report Status PENDING  Incomplete    Medical History: Past Medical History  Diagnosis Date  . Sleep apnea     had surgery to correct it  . Mental disorder     PTSD  . Seizures (HCC)     currently dx with seizures  . Cardiac arrest (HCC) 08/23/2011  . Pneumonia 09/04/2011  . Pancreatitis   . SOB (shortness of breath) 09/11/2011  . Hypertension   . Anxiety   . Myocardial infarction (HCC)   . Schizophrenia (HCC)   . Rhabdomyolysis 08/23/2015  . Acute renal failure (HCC) 08/23/2015    Medications:  Anti-infectives    Start     Dose/Rate Route Frequency Ordered Stop   09/05/15 1800  levofloxacin (LEVAQUIN) IVPB 750 mg     750 mg 100 mL/hr over 90 Minutes Intravenous Every 48 hours 09/05/15 1544     09/04/15 1200  vancomycin (VANCOCIN) IVPB 1000 mg/200 mL premix  Status:  Discontinued     1,000 mg 200 mL/hr over 60 Minutes Intravenous Every M-W-F (Hemodialysis) 09/02/15 1418 09/04/15 1059   09/03/15 2000  anidulafungin (ERAXIS)  100 mg in sodium chloride 0.9 % 100 mL IVPB  Status:  Discontinued     100 mg over 90 Minutes Intravenous Every 24 hours 09/02/15 1620 09/07/15 1013   09/02/15 2000  anidulafungin (ERAXIS) 200 mg in sodium chloride 0.9 % 200 mL IVPB     200 mg over 180 Minutes Intravenous  Once 09/02/15 1812 09/02/15 2307   09/02/15 1630  anidulafungin (ERAXIS) 200 mg in sodium chloride 0.9 % 200 mL IVPB  Status:  Discontinued     200 mg over 180 Minutes Intravenous  Once 09/02/15 1620 09/02/15 2132   09/02/15 1600  piperacillin-tazobactam (ZOSYN) IVPB 3.375 g  Status:  Discontinued     3.375 g 12.5 mL/hr over 240 Minutes Intravenous Every 12 hours 09/02/15 1053 09/02/15 1307   09/02/15 1430  vancomycin (VANCOCIN) 2,000 mg in sodium chloride 0.9 % 500 mL IVPB     2,000 mg 250 mL/hr over 120 Minutes Intravenous  Once 09/02/15 1416 09/02/15 1820   09/02/15 1315  piperacillin-tazobactam (ZOSYN) IVPB 3.375 g  Status:  Discontinued     3.375 g 12.5 mL/hr over 240 Minutes Intravenous Every 12 hours 09/02/15 1307 09/05/15 1447   08/29/15 1700  vancomycin (VANCOCIN) IVPB 1000 mg/200 mL premix  Status:  Discontinued     1,000 mg 200 mL/hr over 60 Minutes Intravenous every 72 hours 08/26/15 1555 08/27/15 1023   08/27/15 1000  meropenem (MERREM) 1 g in sodium chloride 0.9 % 100 mL IVPB  Status:  Discontinued     1 g 200 mL/hr over  30 Minutes Intravenous Every 24 hours 08/26/15 1555 08/27/15 1023   08/26/15 1800  vancomycin (VANCOCIN) 500 mg in sodium chloride irrigation 0.9 % 100 mL ENEMA  Status:  Discontinued     500 mg Rectal 4 times per day 08/26/15 1531 08/27/15 1023   08/26/15 1700  vancomycin (VANCOCIN) IVPB 1000 mg/200 mL premix     1,000 mg 200 mL/hr over 60 Minutes Intravenous  Once 08/26/15 1555 08/26/15 1723   08/25/15 1600  meropenem (MERREM) 1 g in sodium chloride 0.9 % 100 mL IVPB  Status:  Discontinued     1 g 200 mL/hr over 30 Minutes Intravenous Every 12 hours 08/25/15 1545 08/26/15 1555    08/25/15 1600  vancomycin (VANCOCIN) IVPB 1000 mg/200 mL premix  Status:  Discontinued     1,000 mg 200 mL/hr over 60 Minutes Intravenous Every 24 hours 08/25/15 1545 08/26/15 1555   08/24/15 1200  vancomycin (VANCOCIN) 50 mg/mL oral solution 125 mg  Status:  Discontinued     125 mg Oral 4 times per day 08/24/15 1035 09/05/15 1447   08/22/15 2000  vancomycin (VANCOCIN) IVPB 1000 mg/200 mL premix  Status:  Discontinued     1,000 mg 200 mL/hr over 60 Minutes Intravenous Every 24 hours 08/21/15 1439 08/22/15 0956   08/22/15 1800  vancomycin (VANCOCIN) 50 mg/mL oral solution 500 mg  Status:  Discontinued     500 mg Oral 4 times per day 08/22/15 1547 08/24/15 1035   08/22/15 1600  vancomycin (VANCOCIN) IVPB 1000 mg/200 mL premix  Status:  Discontinued     1,000 mg 200 mL/hr over 60 Minutes Intravenous Every 18 hours 08/22/15 0956 08/22/15 1001   08/22/15 1600  vancomycin (VANCOCIN) 1,250 mg in sodium chloride 0.9 % 250 mL IVPB  Status:  Discontinued     1,250 mg 166.7 mL/hr over 90 Minutes Intravenous Every 18 hours 08/22/15 1001 08/23/15 0827   08/22/15 1200  vancomycin (VANCOCIN) 50 mg/mL oral solution 125 mg  Status:  Discontinued     125 mg Oral 4 times per day 08/22/15 1035 08/22/15 1550   08/21/15 2200  piperacillin-tazobactam (ZOSYN) IVPB 3.375 g  Status:  Discontinued     3.375 g 12.5 mL/hr over 240 Minutes Intravenous 3 times per day 08/21/15 1431 08/23/15 0827   08/21/15 2000  vancomycin (VANCOCIN) IVPB 1000 mg/200 mL premix     1,000 mg 200 mL/hr over 60 Minutes Intravenous  Once 08/21/15 1439 08/21/15 2248   08/21/15 1300  piperacillin-tazobactam (ZOSYN) IVPB 3.375 g     3.375 g 100 mL/hr over 30 Minutes Intravenous  Once 08/21/15 1245 08/21/15 1457   08/21/15 1300  vancomycin (VANCOCIN) IVPB 1000 mg/200 mL premix     1,000 mg 200 mL/hr over 60 Minutes Intravenous  Once 08/21/15 1245 08/21/15 1457     Assessment: Patient with pan sensitive citrobacter in sputum. MD changing  antibiotics from Vancomycin and Zosyn to Levaquin. Patient received Zosyn from 11/16-11/19 Levaquin started 11/19   Plan:  Patient is clinically improving and has been moved to Step down status.  Today is day 9 of ABX (4 days of Zosyn, 5 days of Levaquin) Will continue Levaquin 750mg  IV q48h for CrCl <59ml/min.Per ID Abx stop date 11/26.  Cher Nakai, PharmD Pharmacy Resident 09/10/2015 8:06 AM

## 2015-09-10 NOTE — Progress Notes (Signed)
Pt transferred to room 125.  Unsuccessful attempts x5 to reach mother on phone due to busy signal. I did speak to her this morning and she was aware he may transfer to floor today.  Pt alert. Converses softly but difficult to understand. Confused to place, time and events. Follows commands.  Too weak to feed himself. Total bath completed. Lungs with few coarse crackles. Non productive cough. Sats good on room air.Denies pain. UOP good. Flexiseal leaks small amount. No skin breakdown to rectum.  Pink foam to heel left for bogginess. Pink foam prophylactic to sacrum. T max 99.2

## 2015-09-10 NOTE — Progress Notes (Signed)
[facility] - [place] at [facility]   PATIENT NAME: Ross [name]    MR#:  [id number]  DATE OF BIRTH:  09-23-1966    CHIEF COMPLAINT:   Chief Complaint  Patient presents with  . Seizures  remains extubated, no new issues. Overall seem to be improving.  REVIEW OF SYSTEMS:   ROS unable to obtain as patient is not able to speak much  DRUG ALLERGIES:   Allergies  Allergen Reactions  . Antihistamines, Diphenhydramine-Type Anaphylaxis and Rash  . Benadryl [Diphenhydramine Hcl] Anaphylaxis and Rash  . Hydroxyzine Anaphylaxis and Rash    VITALS:  Blood pressure 134/77, pulse 100, temperature 97.9 F (36.6 C), temperature source Oral, resp. rate 20, height  (1.803 m), weight 106.3 kg (234 lb 5.6 oz), SpO2 98 %.  PHYSICAL EXAMINATION:  GENERAL:  49 y.o.-year-old patient recently extubated, weak, short shallow breaths HEENT: Pupils equal round and reactive, oral mucous membranes are moist, orally intubated Pulmonary: Short shallow breaths, good air movement, upper airway congestion, no wheezing  CARDIOVASCULAR: S1, S2 normal. No murmurs, rubs, or gallops. ABDOMEN: Soft, nontender, nondistended. Bowel sounds present. No organomegaly or mass.  EXTREMITIES: +1 edema bilaterally edema. No cyanosis, or clubbing. Peripheral pulses 2+ NEUROLOGIC: Cranial nerves grossly intact, moving all 4 extremities, responds to simple commands, SKIN: No obvious rash, lesion, or ulcer.  Psych: Lethargic  LABORATORY PANEL:   CBC  Recent Labs Lab 09/10/15 0430  WBC 10.5  HGB 9.0*  HCT 27.5*  PLT 228   ------------------------------------------------------------------------------------------------------------------  Chemistries   Recent Labs Lab 09/08/15 0450  09/10/15 0430  NA 146*  < > 144  K 5.0  < > 3.7  CL 116*  < > 115*  CO2 23  < > 20*  GLUCOSE 129*  < > 105*  BUN 59*  < > 47*  CREATININE 3.82*  < > 3.51*  CALCIUM 9.3  < > 9.1  AST 40  --    --   ALT 34  --   --   ALKPHOS 446*  --   --   BILITOT 0.5  --   --   < > = values in this interval not displayed. ------------------------------------------------------------------------------------------------------------------  Cardiac Enzymes No results for input(s): TROPONINI in the last 168 hours. ------------------------------------------------------------------------------------------------------------------  RADIOLOGY:  Ct Chest Wo Contrast  09/09/2015  CLINICAL DATA:  Followup pleural effusion EXAM: CT CHEST WITHOUT CONTRAST TECHNIQUE: Multidetector CT imaging of the chest was performed following the standard protocol without IV contrast. COMPARISON:  09/07/2015 FINDINGS: Mediastinum: The heart size appears normal. There is aortic atherosclerosis noted. Calcification within the LAD coronary artery noted. The trachea is patent and is midline. Normal appearance of the esophagus. No axillary or supraclavicular adenopathy. Lungs/Pleura: There is a left-sided chest tube in place. Loculated left-sided hydro pneumothorax is identified and appears significantly decreased in volume from previous exam. There is compressive type atelectasis and consolidation involving portions of the lingula and left lower lobe. No right pleural effusion. There is a pulmonary nodule within the superior segment of the right lower lobe measuring 1 cm, image 23 of series 3. This is unchanged from 09/07/2015. Upper Abdomen: The adrenal glands are normal. The visualized portions of the liver and spleen are normal. 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 Musculoskeletal: No aggressive lytic or sclerotic bone lesions identified. IMPRESSION: 1. Decrease in volume of loculated left-sided hydropneumothorax with chest tube in place. 2. Persistent 1 cm nodule in the right lower lobe. Consider  followup imaging with nonemergent, outpatient PET-CT. Electronically Signed   By: Signa Kellaylor  Stroud M.D.   On:  09/09/2015 09:59   Dg Chest Port 1 View  09/10/2015  CLINICAL DATA:  Respiratory failure. EXAM: PORTABLE CHEST 1 VIEW COMPARISON:  Chest x-ray and CT scan dated 09/09/2015 chest x-ray dated 09/08/2015 FINDINGS: PICC remains in good position. Heart size and vascularity are within normal limits. Persistent small loculated effusion and slight atelectasis at the left lung base. Chest tube has been removed. No pneumothorax. IMPRESSION: No change since the prior study except for interval removal of the chest. Electronically Signed   By: Francene BoyersJames  Maxwell M.D.   On: 09/10/2015 08:01   Dg Chest Port 1 View  09/09/2015  CLINICAL DATA:  Respiratory failure. EXAM: PORTABLE CHEST 1 VIEW COMPARISON:  09/08/2015.  CT 09/07/2015. FINDINGS: Left PICC line in stable position. Left chest tube in stable position. No pneumothorax. Stable cardiomegaly. Persistent left lower lobe infiltrate and left pleural effusion present. IMPRESSION: 1. Left chest tube in stable position.  No pneumothorax. 2. Persistent left base infiltrate and left pleural effusion. No interim change. Electronically Signed   By: Maisie Fushomas  Register   On: 09/09/2015 07:51    EKG:   Orders placed or performed during the hospital encounter of 08/21/15  . EKG 12-Lead  . EKG 12-Lead    ASSESSMENT AND PLAN:   * Hypernatremia: Na slowly creeping up (147 Today), will try gentle fluid (d5w) and recheck Na in am.  * acute respiratory failure with hypoxia: (VDRF) - Pulmonary CCM following - Intubated 11/4 through 11/9 and then re-intubated 11/15 extubated 11/22 - pneumonia pleural effusion, thoracentesis 11/18 with removal of 1.8 L of fluid, exudative effusion, reaccumulation and chest tube placed today 11/21 - Sputum culture growing Citrobacter covering with Levaquin stop date 11/26 - pleural culture had no bacterial growth  * HAP with large parapneumonic effusion - chest tube placed 11/21, removed 11/23 Supplemental O2 to maintain SpO2 > 92% chest  tube removed on 11/23 by PCCM  * Septic shock secondary to Hospital Acquired pneumonia and also possible line sepsis: See above.  - Left neck line removed, right groin hemodialysis catheter removed - Antifungals discontinued 11/21 - Off levo fed since 11/19 - has PICC line in place.   * Rhabdomyolysis: Resolved - Possibly due to prolonged immobilization, hypotension, diarrhea, possible withdrawal seizures prior to admission.   * acute renal failure: secondary to ATN from one depletion, rhabdomyolysis, C. Difficile. Good urine output. No need for hemodialysis at this time. 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 Albumin very low. Continue to monitor  * left leg edema;dvt study is negative.  * anxiety, mental disorder with PTSD: .schizophrenia;consult psych when extubated.  -> Disposition. Care management has been discussing possible LTAC placement with the family. Currently the family is in favor of him remaining here.  CODE STATUS: Full   TOTAL TIME TAKING CARE OF THIS PATIENT: 35 minutes.    POSSIBLE D/C  ? DAYS, DEPENDING ON CLINICAL CONDITION. I'm ok with him moving out today to floors if PCCM is ok.    [facility], Evani Shrider M.D on 09/10/2015 at 1:28 PM  Between 7am to 6pm - Pager - [phone]  After 6pm go to [email or web address] - password [email or web address] [facility]  [facility]  Office  [phone]  CC: Primary care physician; No primary care provider on file.

## 2015-09-10 NOTE — Plan of Care (Signed)
Problem: Fluid Volume: Goal: Hemodynamic stability will improve Outcome: Progressing IVF infusing.     Problem: Physical Regulation: Goal: Signs and symptoms of infection will decrease Outcome: Progressing Remains on enteric precautions for positive cdiff.  Problem: Education: Goal: Knowledge of [facility] Education information/materials will improve Outcome: Progressing Patient transferred from CCU this afternoon.  Problem: Safety: Goal: Ability to remain free from injury will improve Outcome: Progressing High fall risk, calls out for assistance.  Problem: Health Behavior/Discharge Planning: Goal: Ability to manage health-related needs will improve Outcome: Progressing Patient planning to be discharged to LTAC possibly.  Problem: Pain Managment: Goal: General experience of comfort will improve Outcome: Progressing No c/o pain.  Problem: Skin Integrity: Goal: Risk for impaired skin integrity will decrease Outcome: Progressing q2h turns. Pink foam dressings in place.   Problem: Tissue Perfusion: Goal: Risk factors for ineffective tissue perfusion will decrease Outcome: Progressing On heparin TID.  Problem: Fluid Volume: Goal: Ability to maintain a balanced intake and output will improve Outcome: Progressing IVF infusing. Foley in place.   Problem: Nutrition: Goal: Adequate nutrition will be maintained Outcome: Progressing Patient needs assistance with feeding. Dysphagia II diet.  Problem: Bowel/Gastric: Goal: Will not experience complications related to bowel motility Outcome: Progressing Rectal tube in place. Good output.

## 2015-09-10 NOTE — Plan of Care (Signed)
Problem: Fluid Volume: Goal: Hemodynamic stability will improve Outcome: Progressing NSR . VSS. Sats good on RA. Good UOP. More alert  Problem: Physical Regulation: Goal: Diagnostic test results will improve Outcome: Completed/Met Date Met:  09/10/15 Lactic acid normalized. Responded to abx. Goal: Signs and symptoms of infection will decrease Outcome: Progressing Enteric precautions for c.diff.  PNA and pleural effusions improving. Lungs with few  coarse scattered crackles. Citrobacter KOseri in sputum culture 11/17

## 2015-09-10 NOTE — Progress Notes (Signed)
PULMONARY / CRITICAL CARE MEDICINE   Name: Ross [name] MRN: [id number] DOB: 1966-08-27    ADMISSION DATE:  08/21/2015 CONSULTATION DATE:  11/05  PT PROFILE: 67 M admitted 11/04 with AMS, acute respiratory failure, possible seizures, recent C diff infection, AKI on CKD. Hospitalization c/b HAP, parapneumonic effusion, failed self-extubation X 2, finally tolerated extubation on 11/22   SUBJECTIVE: No acute events overnight, easily awaken this morning, no complaints  MAJOR EVENTS/TEST RESULTS: 11/4 intubated 11/5 failed vent wean due to encephalopathy and acidosis 11/6 RIJ CVL, R Fem Vein VasCath 11/8 bronchoscopy with BAL 11/8 thoracentesis, 1800 cc removed.  11/9 failed self extubation 11/15 transferred back to ICU 11/15 intubated 11/18 Thoracentesis - 1800 cc serosanguinous fluid removed. Exudative chemistries. Cultures negative 11/21 thoracentesis - loculations on [place]. 35 cc serosanguinous fluid removed. 11/21 CT chest: IMPRESSION: Large left pleural effusion is associated with compressive atelectasis of much of the left lung. Endobronchial lesion in the left lower lobe bronchus is not excluded. 11/21 L chest tube placed 11/22 Extubated. tPA to L chest tube 11/23 Repeat CT chest: Decrease in volume of loculated left-sided pleural effusion with chest tube in place. 11/23 chest tube removed  INDWELLING DEVICES:: ETT 11/04 >> 11/09, 11/09 >> out, 11/15 >> 11/22 R IJ CVL 11/06 >> 11/16 R femoral HD cath 11/06 >> 11/16 LUE PICC 11/16 >>   MICRO DATA: Results for orders placed or performed during the hospital encounter of 08/21/15  Blood Culture (routine x 2)     Status: None   Collection Time: 08/21/15 12:47 PM  Result Value Ref Range Status   Specimen Description BLOOD UNKNOWN  Final   Special Requests BOTTLES DRAWN AEROBIC AND ANAEROBIC  1CC  Final   Culture NO GROWTH 10 DAYS  Final   Report Status 08/31/2015 FINAL  Final  Blood Culture (routine x 2)     Status:  None   Collection Time: 08/21/15 12:47 PM  Result Value Ref Range Status   Specimen Description BLOOD LEFT AC  Final   Special Requests BOTTLES DRAWN AEROBIC AND ANAEROBIC  6CC  Final   Culture NO GROWTH 10 DAYS  Final   Report Status 08/31/2015 FINAL  Final  Urine culture     Status: None   Collection Time: 08/21/15 12:47 PM  Result Value Ref Range Status   Specimen Description URINE, CATHETERIZED  Final   Special Requests Normal  Final   Culture NO GROWTH 2 DAYS  Final   Report Status 08/23/2015 FINAL  Final  MRSA PCR Screening     Status: None   Collection Time: 08/21/15 11:47 PM  Result Value Ref Range Status   MRSA by PCR NEGATIVE NEGATIVE Final    Comment:        The GeneXpert MRSA Assay (FDA approved for NASAL specimens only), is one component of a comprehensive MRSA colonization surveillance program. It is not intended to diagnose MRSA infection nor to guide or monitor treatment for MRSA infections.   C difficile quick scan w PCR reflex     Status: Abnormal   Collection Time: 08/22/15  2:31 PM  Result Value Ref Range Status   C Diff antigen POSITIVE (A) NEGATIVE Final   C Diff toxin POSITIVE (A) NEGATIVE Final   C Diff interpretation   Final    Positive for toxigenic C. difficile, active toxin production present.    Comment: CRITICAL RESULT CALLED TO, READ BACK BY AND VERIFIED WITH: CHARLIE FLEETWOOD,RN 08/22/2015 1544 BY J RAZZAKSUAREZ,MT  Culture, bal-quantitative     Status: None   Collection Time: 08/25/15  3:15 PM  Result Value Ref Range Status   Specimen Description BRONCHIAL ALVEOLAR LAVAGE  Final   Special Requests NONE  Final   Gram Stain   Final    GOOD SPECIMEN - 80-90% WBCS FEW WBC SEEN NO ORGANISMS SEEN    Culture HOLDING FOR POSSIBLE PATHOGEN  Final   Report Status 08/27/2015 FINAL  Final  Culture, blood (routine x 2)     Status: None   Collection Time: 09/02/15 12:00 PM  Result Value Ref Range Status   Specimen Description BLOOD RIGHT GROIN   Final   Special Requests   Final    BOTTLES DRAWN AEROBIC AND ANAEROBIC  6CC AERO 10CC ANAERO   Culture NO GROWTH 7 DAYS  Final   Report Status 09/09/2015 FINAL  Final  Culture, blood (routine x 2)     Status: None   Collection Time: 09/02/15 12:15 PM  Result Value Ref Range Status   Specimen Description BLOOD RIGHT GROIN  Final   Special Requests BOTTLES DRAWN AEROBIC AND ANAEROBIC  10 CC  Final   Culture NO GROWTH 7 DAYS  Final   Report Status 09/09/2015 FINAL  Final  Culture, blood (routine x 2)     Status: None   Collection Time: 09/02/15  1:47 PM  Result Value Ref Range Status   Specimen Description BLOOD RIGHT HAND  Final   Special Requests BOTTLES DRAWN AEROBIC AND ANAEROBIC 4CC  Final   Culture NO GROWTH 7 DAYS  Final   Report Status 09/09/2015 FINAL  Final  Culture, blood (routine x 2)     Status: None   Collection Time: 09/02/15  2:06 PM  Result Value Ref Range Status   Specimen Description BLOOD LEFT HAND  Final   Special Requests BOTTLES DRAWN AEROBIC AND ANAEROBIC 1CC  Final   Culture NO GROWTH 7 DAYS  Final   Report Status 09/09/2015 FINAL  Final  MRSA PCR Screening     Status: None   Collection Time: 09/02/15 10:27 PM  Result Value Ref Range Status   MRSA by PCR NEGATIVE NEGATIVE Final    Comment:        The GeneXpert MRSA Assay (FDA approved for NASAL specimens only), is one component of a comprehensive MRSA colonization surveillance program. It is not intended to diagnose MRSA infection nor to guide or monitor treatment for MRSA infections.   Culture, expectorated sputum-assessment     Status: None   Collection Time: 09/03/15 12:18 AM  Result Value Ref Range Status   Specimen Description SPUTUM  Final   Special Requests Normal  Final   Sputum evaluation THIS SPECIMEN IS ACCEPTABLE FOR SPUTUM CULTURE  Final   Report Status 09/03/2015 FINAL  Final  Culture, respiratory (NON-Expectorated)     Status: None   Collection Time: 09/03/15 12:18 AM  Result  Value Ref Range Status   Specimen Description SPUTUM  Final   Special Requests Normal Reflexed from [id number]  Final   Gram Stain   Final    EXCELLENT SPECIMEN - 90-100% WBCS MANY WBC SEEN MANY GRAM NEGATIVE COCCOBACILLI    Culture MODERATE GROWTH CITROBACTER KOSERI  Final   Report Status 09/05/2015 FINAL  Final   Organism ID, Bacteria CITROBACTER KOSERI  Final      Susceptibility   Citrobacter koseri - MIC*    CEFAZOLIN <=4 SENSITIVE Sensitive     CEFTRIAXONE <=1 SENSITIVE Sensitive  CIPROFLOXACIN <=0.25 SENSITIVE Sensitive     GENTAMICIN <=1 SENSITIVE Sensitive     IMIPENEM <=0.25 SENSITIVE Sensitive     TRIMETH/SULFA <=20 SENSITIVE Sensitive     PIP/TAZO Value in next row Sensitive      SENSITIVE<=4    LEVOFLOXACIN Value in next row Sensitive      SENSITIVE<=0.12    * MODERATE GROWTH CITROBACTER KOSERI  Body fluid culture     Status: None   Collection Time: 09/04/15 10:30 AM  Result Value Ref Range Status   Specimen Description CYTO PLEU  Final   Special Requests NONE  Final   Gram Stain   Final    MODERATE WBC SEEN TOO NUMEROUS TO COUNT RED BLOOD CELLS NO ORGANISMS SEEN    Culture No growth aerobically or anaerobically.  Final   Report Status 09/08/2015 FINAL  Final  Body fluid culture     Status: None (Preliminary result)   Collection Time: 09/07/15 10:55 AM  Result Value Ref Range Status   Specimen Description CYTO PLEU  Final   Special Requests NONE  Final   Gram Stain FEW WBC SEEN NO ORGANISMS SEEN   Final   Culture NO GROWTH 2 DAYS  Final   Report Status PENDING  Incomplete     ANTIMICROBIALS:  Anti-infectives    Start     Dose/Rate Route Frequency Ordered Stop   09/05/15 1800  levofloxacin (LEVAQUIN) IVPB 750 mg     750 mg 100 mL/hr over 90 Minutes Intravenous Every 48 hours 09/05/15 1544     09/04/15 1200  vancomycin (VANCOCIN) IVPB 1000 mg/200 mL premix  Status:  Discontinued     1,000 mg 200 mL/hr over 60 Minutes Intravenous Every M-W-F  (Hemodialysis) 09/02/15 1418 09/04/15 1059   09/03/15 2000  anidulafungin (ERAXIS) 100 mg in sodium chloride 0.9 % 100 mL IVPB  Status:  Discontinued     100 mg over 90 Minutes Intravenous Every 24 hours 09/02/15 1620 09/07/15 1013   09/02/15 2000  anidulafungin (ERAXIS) 200 mg in sodium chloride 0.9 % 200 mL IVPB     200 mg over 180 Minutes Intravenous  Once 09/02/15 1812 09/02/15 2307   09/02/15 1630  anidulafungin (ERAXIS) 200 mg in sodium chloride 0.9 % 200 mL IVPB  Status:  Discontinued     200 mg over 180 Minutes Intravenous  Once 09/02/15 1620 09/02/15 2132   09/02/15 1600  piperacillin-tazobactam (ZOSYN) IVPB 3.375 g  Status:  Discontinued     3.375 g 12.5 mL/hr over 240 Minutes Intravenous Every 12 hours 09/02/15 1053 09/02/15 1307   09/02/15 1430  vancomycin (VANCOCIN) 2,000 mg in sodium chloride 0.9 % 500 mL IVPB     2,000 mg 250 mL/hr over 120 Minutes Intravenous  Once 09/02/15 1416 09/02/15 1820   09/02/15 1315  piperacillin-tazobactam (ZOSYN) IVPB 3.375 g  Status:  Discontinued     3.375 g 12.5 mL/hr over 240 Minutes Intravenous Every 12 hours 09/02/15 1307 09/05/15 1447   08/29/15 1700  vancomycin (VANCOCIN) IVPB 1000 mg/200 mL premix  Status:  Discontinued     1,000 mg 200 mL/hr over 60 Minutes Intravenous every 72 hours 08/26/15 1555 08/27/15 1023   08/27/15 1000  meropenem (MERREM) 1 g in sodium chloride 0.9 % 100 mL IVPB  Status:  Discontinued     1 g 200 mL/hr over 30 Minutes Intravenous Every 24 hours 08/26/15 1555 08/27/15 1023   08/26/15 1800  vancomycin (VANCOCIN) 500 mg in sodium chloride irrigation 0.9 %  100 mL ENEMA  Status:  Discontinued     500 mg Rectal 4 times per day 08/26/15 1531 08/27/15 1023   08/26/15 1700  vancomycin (VANCOCIN) IVPB 1000 mg/200 mL premix     1,000 mg 200 mL/hr over 60 Minutes Intravenous  Once 08/26/15 1555 08/26/15 1723   08/25/15 1600  meropenem (MERREM) 1 g in sodium chloride 0.9 % 100 mL IVPB  Status:  Discontinued     1 g 200  mL/hr over 30 Minutes Intravenous Every 12 hours 08/25/15 1545 08/26/15 1555   08/25/15 1600  vancomycin (VANCOCIN) IVPB 1000 mg/200 mL premix  Status:  Discontinued     1,000 mg 200 mL/hr over 60 Minutes Intravenous Every 24 hours 08/25/15 1545 08/26/15 1555   08/24/15 1200  vancomycin (VANCOCIN) 50 mg/mL oral solution 125 mg  Status:  Discontinued     125 mg Oral 4 times per day 08/24/15 1035 09/05/15 1447   08/22/15 2000  vancomycin (VANCOCIN) IVPB 1000 mg/200 mL premix  Status:  Discontinued     1,000 mg 200 mL/hr over 60 Minutes Intravenous Every 24 hours 08/21/15 1439 08/22/15 0956   08/22/15 1800  vancomycin (VANCOCIN) 50 mg/mL oral solution 500 mg  Status:  Discontinued     500 mg Oral 4 times per day 08/22/15 1547 08/24/15 1035   08/22/15 1600  vancomycin (VANCOCIN) IVPB 1000 mg/200 mL premix  Status:  Discontinued     1,000 mg 200 mL/hr over 60 Minutes Intravenous Every 18 hours 08/22/15 0956 08/22/15 1001   08/22/15 1600  vancomycin (VANCOCIN) 1,250 mg in sodium chloride 0.9 % 250 mL IVPB  Status:  Discontinued     1,250 mg 166.7 mL/hr over 90 Minutes Intravenous Every 18 hours 08/22/15 1001 08/23/15 0827   08/22/15 1200  vancomycin (VANCOCIN) 50 mg/mL oral solution 125 mg  Status:  Discontinued     125 mg Oral 4 times per day 08/22/15 1035 08/22/15 1550   08/21/15 2200  piperacillin-tazobactam (ZOSYN) IVPB 3.375 g  Status:  Discontinued     3.375 g 12.5 mL/hr over 240 Minutes Intravenous 3 times per day 08/21/15 1431 08/23/15 0827   08/21/15 2000  vancomycin (VANCOCIN) IVPB 1000 mg/200 mL premix     1,000 mg 200 mL/hr over 60 Minutes Intravenous  Once 08/21/15 1439 08/21/15 2248   08/21/15 1300  piperacillin-tazobactam (ZOSYN) IVPB 3.375 g     3.375 g 100 mL/hr over 30 Minutes Intravenous  Once 08/21/15 1245 08/21/15 1457   08/21/15 1300  vancomycin (VANCOCIN) IVPB 1000 mg/200 mL premix     1,000 mg 200 mL/hr over 60 Minutes Intravenous  Once 08/21/15 1245 08/21/15 1457        SUBJECTIVE:  RASS 0, + F/C. Has tolerated extubation well. Presently on RA. Intermittent agitation  VITAL SIGNS: BP 150/85 mmHg  Pulse 75  Temp(Src) 99.2 F (37.3 C) (Oral)  Resp 20  Ht 5\' 11"  (1.803 m)  Wt 234 lb 5.6 oz (106.3 kg)  BMI 32.70 kg/m2  SpO2 99%  HEMODYNAMICS:    VENTILATOR SETTINGS:    INTAKE / OUTPUT: I/O last 3 completed shifts: In: 2605.8 [I.V.:2455.8; IV Piggyback:150] Out: 2575 [Urine:2000; Chest Tube:575]  PHYSICAL EXAMINATION: General: RASS 0, + F/C Neuro: CNs intact, MAEs HEENT: NCAT, WNL Cardiovascular: reg, no M Lungs:diminished on L (but with good air movement) Abdomen: soft, NT, +BS Ext: warm, no edema  LABS:  CBC  Recent Labs Lab 09/07/15 0447 09/08/15 0450 09/10/15 0430  WBC 7.1 7.8 10.5  HGB 8.7* 9.4* 9.0*  HCT 27.3* 29.5* 27.5*  PLT 247 259 228   Coag's No results for input(s): APTT, INR in the last 168 hours. BMET  Recent Labs Lab 09/08/15 0450 09/09/15 0500 09/10/15 0430  NA 146* 147* 144  K 5.0 4.0 3.7  CL 116* 117* 115*  CO2 23 20* 20*  BUN 59* 55* 47*  CREATININE 3.82* 3.67* 3.51*  GLUCOSE 129* 113* 105*   Electrolytes  Recent Labs Lab 09/08/15 0450 09/09/15 0500 09/10/15 0430  CALCIUM 9.3 9.2 9.1   Sepsis Markers No results for input(s): LATICACIDVEN, PROCALCITON, O2SATVEN in the last 168 hours. ABG  Recent Labs Lab 09/06/15 0400 09/07/15 0414  PHART 7.32* 7.30*  PCO2ART 44 45  PO2ART 137* 165*   Liver Enzymes  Recent Labs Lab 09/05/15 0507 09/08/15 0450  AST 40 40  ALT 49 34  ALKPHOS 208* 446*  BILITOT 0.8 0.5  ALBUMIN 1.8* 1.8*   Cardiac Enzymes No results for input(s): TROPONINI, PROBNP in the last 168 hours. Glucose  Recent Labs Lab 09/08/15 2346 09/09/15 0355 09/09/15 0712 09/09/15 1142 09/09/15 1624 09/10/15 0729  GLUCAP 103* 102* 111* 95 90 99    Imaging Ct Chest Wo Contrast  09/09/2015  CLINICAL DATA:  Followup pleural effusion EXAM: CT CHEST WITHOUT  CONTRAST TECHNIQUE: Multidetector CT imaging of the chest was performed following the standard protocol without IV contrast. COMPARISON:  09/07/2015 FINDINGS: Mediastinum: The heart size appears normal. There is aortic atherosclerosis noted. Calcification within the LAD coronary artery noted. The trachea is patent and is midline. Normal appearance of the esophagus. No axillary or supraclavicular adenopathy. Lungs/Pleura: There is a left-sided chest tube in place. Loculated left-sided hydro pneumothorax is identified and appears significantly decreased in volume from previous exam. There is compressive type atelectasis and consolidation involving portions of the lingula and left lower lobe. No right pleural effusion. There is a pulmonary nodule within the superior segment of the right lower lobe measuring 1 cm, image 23 of series 3. This is unchanged from 09/07/2015. Upper Abdomen: The adrenal glands are normal. The visualized portions of the liver and spleen are normal. 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 Musculoskeletal: No aggressive lytic or sclerotic bone lesions identified. IMPRESSION: 1. Decrease in volume of loculated left-sided hydropneumothorax with chest tube in place. 2. Persistent 1 cm nodule in the right lower lobe. Consider followup imaging with nonemergent, outpatient PET-CT. Electronically Signed   By: Signa Kell M.D.   On: 09/09/2015 09:59   Dg Chest Port 1 View  09/10/2015  CLINICAL DATA:  Respiratory failure. EXAM: PORTABLE CHEST 1 VIEW COMPARISON:  Chest x-ray and CT scan dated 09/09/2015 chest x-ray dated 09/08/2015 FINDINGS: PICC remains in good position. Heart size and vascularity are within normal limits. Persistent small loculated effusion and slight atelectasis at the left lung base. Chest tube has been removed. No pneumothorax. IMPRESSION: No change since the prior study except for interval removal of the chest. Electronically Signed   By: Francene Boyers M.D.   On: 09/10/2015 08:01     ASSESSMENT / PLAN:  PULMONARY A: Prolonged VDRF - extubated 11/22 and tolerating HAP with large parapneumonic effusion - chest tube placed 11/21, removed 11/23 P:   Transfer out of ICU today  Supplemental O2 to maintain SpO2 > 92% CXR - with small effusion, stable, no worsening findings  CARDIOVASCULAR A:  No acute issues P:  Monitor hemodynamics  RENAL A:   AKI, nonoliguric - Cr  improving CKD P:   Monitor BMET intermittently Monitor I/Os Correct electrolytes as indicated Renal service following  GASTROINTESTINAL A: Post extubation dysphagia - passed SLP eval 11/23  P:   SUP: N/I post extubation Begin diet   HEMATOLOGIC A:   Mild anemia without overt bleeding P:  DVT px: SQ heparin Monitor CBC intermittently Transfuse per usual ICU guidelines  INFECTIOUS A:   C diff colitis, fully treated Citrobacter HAP Complicated parapneumonic effusion Severe sepsis, resolving P:   Monitor temp, WBC count Micro and abx as above Cont levofloxacin through 11/26 per ID recs  ENDOCRINE A:   No issues P:   Monitor glu on chem panels Consider SSI for glu > 180  NEUROLOGIC A:   H/O schizophrenia ? Of seizures - Neuro has seen. Recs no ACDs presently H/O EtOH abuse Agitation, controlled Pain, controlled P:   RASS goal: 0 Cont PRN fentanyl Cont PRN haldol   Critical Care time - 35mins  Stephanie AcreVishal Meighan Treto, MD [facility] Pager [phone] (please enter 7-digits) On Call Pager - [phone] (please enter 7-digits)     09/10/2015, 8:08 AM

## 2015-09-10 NOTE — Progress Notes (Signed)
Subjective:  Overall patient doing much better as compared to when I previously saw him. Urine output was 1.2 L over the past 24 hours. Creatinine currently 3.51. Sodium down to 144. Patient states he's feeling better.  Objective:  Vital signs in last 24 hours:  Temp:  [99.2 F (37.3 C)-99.5 F (37.5 C)] 99.2 F (37.3 C) (11/24 0745) Pulse Rate:  [75-102] 75 (11/24 0745) Resp:  [18-34] 21 (11/24 0745) BP: (80-160)/(47-122) 152/86 mmHg (11/24 0700) SpO2:  [95 %-100 %] 99 % (11/24 0745) Weight:  [106.3 kg (234 lb 5.6 oz)] 106.3 kg (234 lb 5.6 oz) (11/24 0424)  Weight change: 2.1 kg (4 lb 10.1 oz) Filed Weights   09/08/15 0500 09/09/15 0415 09/10/15 0424  Weight: 111.5 kg (245 lb 13 oz) 104.2 kg (229 lb 11.5 oz) 106.3 kg (234 lb 5.6 oz)    Intake/Output:    Intake/Output Summary (Last 24 hours) at 09/10/15 0748 Last data filed at 09/09/15 1800  Gross per 24 hour  Intake 1997.5 ml  Output   1275 ml  Net  722.5 ml     Physical Exam: General: No acute distress  HEENT  [place]/AT, EOMI OM moist  Neck  supple  Pulm/lungs  bilateral rhonchi, Left chest tube  CVS/Heart  S1S2 no rubs  Abdomen:   soft, NTND BS present  Extremities:  + b/l LE edema  Neurologic: Awake, alert, following commands  Skin: no acute rashes  GU: Foley in place        Basic Metabolic Panel:   Recent Labs Lab 09/06/15 0600 09/07/15 0447 09/08/15 0450 09/09/15 0500 09/10/15 0430  NA 144 146* 146* 147* 144  K 5.1 4.9 5.0 4.0 3.7  CL 115* 116* 116* 117* 115*  CO2 23 23 23  20* 20*  GLUCOSE 106* 104* 129* 113* 105*  BUN 59* 62* 59* 55* 47*  CREATININE 4.33* 4.19* 3.82* 3.67* 3.51*  CALCIUM 8.9 9.0 9.3 9.2 9.1     CBC:  Recent Labs Lab 09/05/15 0507 09/06/15 0600 09/07/15 0447 09/08/15 0450 09/10/15 0430  WBC 7.4 7.7 7.1 7.8 10.5  HGB 8.7* 9.0* 8.7* 9.4* 9.0*  HCT 26.2* 28.0* 27.3* 29.5* 27.5*  MCV 91.5 90.2 90.6 89.7 86.9  PLT 204 250 247 259 228      Microbiology:  Recent  Results (from the past 720 hour(s))  Blood Culture (routine x 2)     Status: None   Collection Time: 08/21/15 12:47 PM  Result Value Ref Range Status   Specimen Description BLOOD UNKNOWN  Final   Special Requests BOTTLES DRAWN AEROBIC AND ANAEROBIC  1CC  Final   Culture NO GROWTH 10 DAYS  Final   Report Status 08/31/2015 FINAL  Final  Blood Culture (routine x 2)     Status: None   Collection Time: 08/21/15 12:47 PM  Result Value Ref Range Status   Specimen Description BLOOD LEFT AC  Final   Special Requests BOTTLES DRAWN AEROBIC AND ANAEROBIC  6CC  Final   Culture NO GROWTH 10 DAYS  Final   Report Status 08/31/2015 FINAL  Final  Urine culture     Status: None   Collection Time: 08/21/15 12:47 PM  Result Value Ref Range Status   Specimen Description URINE, CATHETERIZED  Final   Special Requests Normal  Final   Culture NO GROWTH 2 DAYS  Final   Report Status 08/23/2015 FINAL  Final  MRSA PCR Screening     Status: None   Collection Time: 08/21/15 11:47 PM  Result Value Ref Range Status   MRSA by PCR NEGATIVE NEGATIVE Final    Comment:        The GeneXpert MRSA Assay (FDA approved for NASAL specimens only), is one component of a comprehensive MRSA colonization surveillance program. It is not intended to diagnose MRSA infection nor to guide or monitor treatment for MRSA infections.   C difficile quick scan w PCR reflex     Status: Abnormal   Collection Time: 08/22/15  2:31 PM  Result Value Ref Range Status   C Diff antigen POSITIVE (A) NEGATIVE Final   C Diff toxin POSITIVE (A) NEGATIVE Final   C Diff interpretation   Final    Positive for toxigenic C. difficile, active toxin production present.    Comment: CRITICAL RESULT CALLED TO, READ BACK BY AND VERIFIED WITH: CHARLIE FLEETWOOD,RN 08/22/2015 1544 BY J RAZZAKSUAREZ,MT   Culture, bal-quantitative     Status: None   Collection Time: 08/25/15  3:15 PM  Result Value Ref Range Status   Specimen Description BRONCHIAL  ALVEOLAR LAVAGE  Final   Special Requests NONE  Final   Gram Stain   Final    GOOD SPECIMEN - 80-90% WBCS FEW WBC SEEN NO ORGANISMS SEEN    Culture HOLDING FOR POSSIBLE PATHOGEN  Final   Report Status 08/27/2015 FINAL  Final  Culture, blood (routine x 2)     Status: None   Collection Time: 09/02/15 12:00 PM  Result Value Ref Range Status   Specimen Description BLOOD RIGHT GROIN  Final   Special Requests   Final    BOTTLES DRAWN AEROBIC AND ANAEROBIC  6CC AERO 10CC ANAERO   Culture NO GROWTH 7 DAYS  Final   Report Status 09/09/2015 FINAL  Final  Culture, blood (routine x 2)     Status: None   Collection Time: 09/02/15 12:15 PM  Result Value Ref Range Status   Specimen Description BLOOD RIGHT GROIN  Final   Special Requests BOTTLES DRAWN AEROBIC AND ANAEROBIC  10 CC  Final   Culture NO GROWTH 7 DAYS  Final   Report Status 09/09/2015 FINAL  Final  Culture, blood (routine x 2)     Status: None   Collection Time: 09/02/15  1:47 PM  Result Value Ref Range Status   Specimen Description BLOOD RIGHT HAND  Final   Special Requests BOTTLES DRAWN AEROBIC AND ANAEROBIC 4CC  Final   Culture NO GROWTH 7 DAYS  Final   Report Status 09/09/2015 FINAL  Final  Culture, blood (routine x 2)     Status: None   Collection Time: 09/02/15  2:06 PM  Result Value Ref Range Status   Specimen Description BLOOD LEFT HAND  Final   Special Requests BOTTLES DRAWN AEROBIC AND ANAEROBIC 1CC  Final   Culture NO GROWTH 7 DAYS  Final   Report Status 09/09/2015 FINAL  Final  MRSA PCR Screening     Status: None   Collection Time: 09/02/15 10:27 PM  Result Value Ref Range Status   MRSA by PCR NEGATIVE NEGATIVE Final    Comment:        The GeneXpert MRSA Assay (FDA approved for NASAL specimens only), is one component of a comprehensive MRSA colonization surveillance program. It is not intended to diagnose MRSA infection nor to guide or monitor treatment for MRSA infections.   Culture, expectorated  sputum-assessment     Status: None   Collection Time: 09/03/15 12:18 AM  Result Value Ref Range Status   Specimen  Description SPUTUM  Final   Special Requests Normal  Final   Sputum evaluation THIS SPECIMEN IS ACCEPTABLE FOR SPUTUM CULTURE  Final   Report Status 09/03/2015 FINAL  Final  Culture, respiratory (NON-Expectorated)     Status: None   Collection Time: 09/03/15 12:18 AM  Result Value Ref Range Status   Specimen Description SPUTUM  Final   Special Requests Normal Reflexed from [id number]  Final   Gram Stain   Final    EXCELLENT SPECIMEN - 90-100% WBCS MANY WBC SEEN MANY GRAM NEGATIVE COCCOBACILLI    Culture MODERATE GROWTH CITROBACTER KOSERI  Final   Report Status 09/05/2015 FINAL  Final   Organism ID, Bacteria CITROBACTER KOSERI  Final      Susceptibility   Citrobacter koseri - MIC*    CEFAZOLIN <=4 SENSITIVE Sensitive     CEFTRIAXONE <=1 SENSITIVE Sensitive     CIPROFLOXACIN <=0.25 SENSITIVE Sensitive     GENTAMICIN <=1 SENSITIVE Sensitive     IMIPENEM <=0.25 SENSITIVE Sensitive     TRIMETH/SULFA <=20 SENSITIVE Sensitive     PIP/TAZO Value in next row Sensitive      SENSITIVE<=4    LEVOFLOXACIN Value in next row Sensitive      SENSITIVE<=0.12    * MODERATE GROWTH CITROBACTER KOSERI  Body fluid culture     Status: None   Collection Time: 09/04/15 10:30 AM  Result Value Ref Range Status   Specimen Description CYTO PLEU  Final   Special Requests NONE  Final   Gram Stain   Final    MODERATE WBC SEEN TOO NUMEROUS TO COUNT RED BLOOD CELLS NO ORGANISMS SEEN    Culture No growth aerobically or anaerobically.  Final   Report Status 09/08/2015 FINAL  Final  Body fluid culture     Status: None (Preliminary result)   Collection Time: 09/07/15 10:55 AM  Result Value Ref Range Status   Specimen Description CYTO PLEU  Final   Special Requests NONE  Final   Gram Stain FEW WBC SEEN NO ORGANISMS SEEN   Final   Culture NO GROWTH 2 DAYS  Final   Report Status PENDING   Incomplete    Coagulation Studies: No results for input(s): LABPROT, INR in the last 72 hours.  Urinalysis: No results for input(s): COLORURINE, LABSPEC, PHURINE, GLUCOSEU, HGBUR, BILIRUBINUR, KETONESUR, PROTEINUR, UROBILINOGEN, NITRITE, LEUKOCYTESUR in the last 72 hours.  Invalid input(s): APPERANCEUR    Imaging: Ct Chest Wo Contrast  09/09/2015  CLINICAL DATA:  Followup pleural effusion EXAM: CT CHEST WITHOUT CONTRAST TECHNIQUE: Multidetector CT imaging of the chest was performed following the standard protocol without IV contrast. COMPARISON:  09/07/2015 FINDINGS: Mediastinum: The heart size appears normal. There is aortic atherosclerosis noted. Calcification within the LAD coronary artery noted. The trachea is patent and is midline. Normal appearance of the esophagus. No axillary or supraclavicular adenopathy. Lungs/Pleura: There is a left-sided chest tube in place. Loculated left-sided hydro pneumothorax is identified and appears significantly decreased in volume from previous exam. There is compressive type atelectasis and consolidation involving portions of the lingula and left lower lobe. No right pleural effusion. There is a pulmonary nodule within the superior segment of the right lower lobe measuring 1 cm, image 23 of series 3. This is unchanged from 09/07/2015. Upper Abdomen: The adrenal glands are normal. The visualized portions of the liver and spleen are normal. 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 Musculoskeletal: No aggressive lytic or sclerotic bone lesions identified. IMPRESSION: 1. Decrease in volume  of loculated left-sided hydropneumothorax with chest tube in place. 2. Persistent 1 cm nodule in the right lower lobe. Consider followup imaging with nonemergent, outpatient PET-CT. Electronically Signed   By: Signa Kellaylor  Stroud M.D.   On: 09/09/2015 09:59   Dg Chest Port 1 View  09/09/2015  CLINICAL DATA:  Respiratory failure. EXAM: PORTABLE CHEST 1  VIEW COMPARISON:  09/08/2015.  CT 09/07/2015. FINDINGS: Left PICC line in stable position. Left chest tube in stable position. No pneumothorax. Stable cardiomegaly. Persistent left lower lobe infiltrate and left pleural effusion present. IMPRESSION: 1. Left chest tube in stable position.  No pneumothorax. 2. Persistent left base infiltrate and left pleural effusion. No interim change. Electronically Signed   By: Maisie Fushomas  Register   On: 09/09/2015 07:51     Medications:   . dextrose 100 mL/hr at 09/09/15 1004  . dextrose 50 mL/hr at 09/09/15 1715   . antiseptic oral rinse  7 mL Mouth Rinse QID  . chlorhexidine gluconate  15 mL Mouth Rinse BID  . feeding supplement (NEPRO CARB STEADY)  237 mL Oral TID WC  . heparin subcutaneous  5,000 Units Subcutaneous 3 times per day  . levofloxacin (LEVAQUIN) IV  750 mg Intravenous Q48H   acetaminophen **OR** acetaminophen, fentaNYL (SUBLIMAZE) injection, haloperidol lactate, hydrALAZINE, Influenza vac split quadrivalent PF, ipratropium-albuterol, ondansetron (ZOFRAN) IV  Assessment/ Plan:  49 y.o. male With alcohol abuse, schizophrenia, seizure disorder, coronary disease/MI 2012 was [facility] November 4 after being found unresponsive in his apartment. Urine toxicology screen is positive for benzodiazepines and cannabinoids. Stool positive for C. Difficile  1. Acute renal failure, likely secondary to ATN from concurrent illness, volume depletion, C. difficile, rhabdomyolysis and sepsis.   - Patient continues to have good urine output. Creatinine trending down very slowly. Continue to monitor progress from a renal perspective. Avoid nephrotoxins as possible. Continue IV fluid hydration for now.  2. Acute respiratory failure due to bacterial pneumonia/Left pleural effusion s/p thoracentesis 11/18. - chest tube 11/22 - Doing well post extubation.  3.  Hypernatremia. Sodium down to 144. Continue D5W at 100 cc per hour for 1 additional day.  4.   Hypokalemia. Resolved.  5. Infective Colitis A09: c. Diff positive.  - treated with PO vancomycin previously.  6.  Acute rhabdomyolysis M62.82: Contributed to acute renal failure, resolved with conservative management.   LOS: 20 Devell Parkerson 11/24/20167:48 AM

## 2015-09-11 LAB — BASIC METABOLIC PANEL
ANION GAP: 8 (ref 5–15)
BUN: 40 mg/dL — ABNORMAL HIGH (ref 6–20)
CHLORIDE: 112 mmol/L — AB (ref 101–111)
CO2: 21 mmol/L — AB (ref 22–32)
CREATININE: 3.13 mg/dL — AB (ref 0.61–1.24)
Calcium: 8.6 mg/dL — ABNORMAL LOW (ref 8.9–10.3)
GFR calc non Af Amer: 22 mL/min — ABNORMAL LOW (ref >60)
GFR, EST AFRICAN AMERICAN: 26 mL/min — AB (ref >60)
Glucose, Bld: 97 mg/dL (ref 65–99)
POTASSIUM: 3.3 mmol/L — AB (ref 3.5–5.1)
SODIUM: 141 mmol/L (ref 135–145)

## 2015-09-11 LAB — CBC
HEMATOCRIT: 27 % — AB (ref 40.0–52.0)
HEMOGLOBIN: 9 g/dL — AB (ref 13.0–18.0)
MCH: 29 pg (ref 26.0–34.0)
MCHC: 33.2 g/dL (ref 32.0–36.0)
MCV: 87.2 fL (ref 80.0–100.0)
Platelets: 179 10*3/uL (ref 150–440)
RBC: 3.1 MIL/uL — AB (ref 4.40–5.90)
RDW: 13.5 % (ref 11.5–14.5)
WBC: 9.9 10*3/uL (ref 3.8–10.6)

## 2015-09-11 LAB — BODY FLUID CULTURE: Culture: NO GROWTH

## 2015-09-11 MED ORDER — ENSURE ENLIVE PO LIQD
237.0000 mL | Freq: Three times a day (TID) | ORAL | Status: DC
  Administered 2015-09-11 – 2015-09-14 (×9): 237 mL via ORAL

## 2015-09-11 MED ORDER — POTASSIUM CHLORIDE 10 MEQ/100ML IV SOLN
10.0000 meq | INTRAVENOUS | Status: AC
  Administered 2015-09-11 (×4): 10 meq via INTRAVENOUS
  Filled 2015-09-11 (×4): qty 100

## 2015-09-11 NOTE — Progress Notes (Signed)
Physical Therapy Treatment Patient Details Name: Ross [name] MRN: [id number] DOB: 13-Feb-1966 Today's Date: 09/11/2015    History of Present Illness presented to ER after being found unresponsive and noted to have seizure-like activity, intubated in ED due to respiratory failure with hypoxia; admitted with sepsis (likely related to c-diff) with multi-organ failure.  Noted with mild elevation in troponin, likely secondary to rhabdomyolysis.  Hospital course additionally significant for placement of R temp femoral dialysis catheter for HD (now removed); self-extubated on 11/9 and transferred to gen medical floor.  Experienced progressive respiratory distress requiring return to CCU and reintubation 11/15-11/22.  Status post thoracentesis 11/18 and 11/21 and placement of L CT 11/21 (removed 11/23).  PT now re-consulted for continued services as medical status improving.    PT Comments    Pt tolerating treatment session well, motivated and able to complete entire most of sesssion as planned. Pt remains motivated to participate in exercises in bed, despite pain and quick onset of fatigue. Pt's greatest limitation continues to be total body functional weakness, which continues to limit ability to perform all mobility at baseline function. Pt has difficulty performing manually resisted therex to 10 repetitions without shaking and decrease in power output. Patient presenting with impairment of strength, pain, range of motion, and activity tolerance, limiting ability to perform ADL and mobility tasks at  baseline level of function. Patient will benefit from skilled intervention to address the above impairments and limitations, in order to restore to prior level of function, improve patient safety upon discharge, and to decrease caregiver burden.    Follow Up Recommendations  CIR     Equipment Recommendations  None recommended by PT    Recommendations for Other Services       Precautions /  Restrictions Precautions Precautions: Fall Precaution Comments: Enteric isolation Restrictions Weight Bearing Restrictions: No    Mobility  Bed Mobility Overal bed mobility: +2 for physical assistance Bed Mobility: Rolling (Bridging. ) Rolling: +2 for physical assistance;Max assist;Mod assist         General bed mobility comments: mod-max A for bed mobility, rolling and bridigng for bed pan use.   Transfers                 General transfer comment: Session limited by need to void bowels.   Ambulation/Gait                 Stairs            Wheelchair Mobility    Modified Rankin (Stroke Patients Only)       Balance                                    Cognition Arousal/Alertness: Awake/alert Behavior During Therapy: WFL for tasks assessed/performed Overall Cognitive Status:  (Pt reports his full name, month November, year 2004, place [address], and [employer]. Occational speech that is off topic, often difficult to comprehend. )                      Exercises Total Joint Exercises Bridges: AAROM;Both;15 reps;Supine General Exercises - Lower Extremity Ankle Circles/Pumps:  (Maunally resisted ankle plantar flexion, dorsiflexion 1x10 bilat) Short Arc Quad: AAROM;Both;10 reps;Supine Hip ABduction/ADduction: Left;10 reps;Supine;AAROM (Unable to perform on R prior to needing to void. ) Shoulder Exercises Elbow Flexion: AROM;Strengthening;Both;10 reps (manually resisted. ) Elbow Extension: AROM;Strengthening;Both;10 reps (manually resisted. )  General Comments        Pertinent Vitals/Pain Pain Assessment: 0-10 Pain Score: 8  Pain Location: RLE pain from thigh down to foot; worse with internal rotation past neutral. LLE also painful with hip rotational movement.  Pain Intervention(s): Limited activity within patient's tolerance;Monitored during session;Premedicated before session    Home Living Family/patient expects  to be discharged to:: Inpatient rehab Living Arrangements: Other (Comment)   Type of Home: Apartment Home Access: Level entry   Home Layout: One level Home Equipment: Cane - single point      Prior Function Level of Independence: Independent with assistive device(s)      Comments: Per patient report, mod indep with use of SPC; does endorse intermittent fall history.  Questionable historian.   PT Goals (current goals can now be found in the care plan section) Acute Rehab PT Goals Patient Stated Goal: to move around a little bit PT Goal Formulation: With patient Time For Goal Achievement: 09/23/15 Potential to Achieve Goals: Good Additional Goals Additional Goal #1: Assess and establish goals for OOB activity as appropriate. Progress towards PT goals: PT to reassess next treatment    Frequency  7X/week    PT Plan Current plan remains appropriate;Frequency needs to be updated    Co-evaluation             End of Session   Activity Tolerance: Patient limited by fatigue;Patient limited by lethargy Patient left: in bed;with call bell/phone within reach;with nursing/sitter in room     Time: 1347-1413 PT Time Calculation (min) (ACUTE ONLY): 26 min  Charges:  $Therapeutic Exercise: 23-37 mins                    G Codes:      Zacharia Sowles C 09/11/2015, 2:32 PM  2:35 PM  Rosamaria LintsAllan C Hollyn Stucky, PT, DPT [place] License # [id number]

## 2015-09-11 NOTE — Progress Notes (Signed)
Subjective:  Cr trending down slowly. Currently 3.1.   Good UOP noted of 2.8 liters over the past 24 hours.  Pt much more awake, alert, and conversant today.  Objective:  Vital signs in last 24 hours:  Temp:  [97.9 F (36.6 C)-99.3 F (37.4 C)] 99.3 F (37.4 C) (11/25 0444) Pulse Rate:  [75-100] 75 (11/25 0444) Resp:  [18-21] 18 (11/25 0444) BP: (134-152)/(77-97) 139/78 mmHg (11/25 0444) SpO2:  [98 %-100 %] 99 % (11/25 0444) Weight:  [105.597 kg (232 lb 12.8 oz)] 105.597 kg (232 lb 12.8 oz) (11/25 0500)  Weight change: -0.703 kg (-1 lb 8.8 oz) Filed Weights   09/09/15 0415 09/10/15 0424 09/11/15 0500  Weight: 104.2 kg (229 lb 11.5 oz) 106.3 kg (234 lb 5.6 oz) 105.597 kg (232 lb 12.8 oz)    Intake/Output:    Intake/Output Summary (Last 24 hours) at 09/11/15 0911 Last data filed at 09/11/15 0444  Gross per 24 hour  Intake    420 ml  Output   2875 ml  Net  -2455 ml     Physical Exam: General: No acute distress  HEENT [place]/AT, EOMI OM moist  Neck supple  Pulm/lungs bilateral rhonchi, Left chest tube  CVS/Heart S1S2 no rubs  Abdomen:  soft, NTND BS present  Extremities: + b/l LE edema  Neurologic: Awake, alert, following commands, conversant  Skin: no acute rashes  GU: Foley in place        Basic Metabolic Panel:   Recent Labs Lab 09/07/15 0447 09/08/15 0450 09/09/15 0500 09/10/15 0430 09/11/15 0530  NA 146* 146* 147* 144 141  K 4.9 5.0 4.0 3.7 3.3*  CL 116* 116* 117* 115* 112*  CO2 23 23 20* 20* 21*  GLUCOSE 104* 129* 113* 105* 97  BUN 62* 59* 55* 47* 40*  CREATININE 4.19* 3.82* 3.67* 3.51* 3.13*  CALCIUM 9.0 9.3 9.2 9.1 8.6*     CBC:  Recent Labs Lab 09/06/15 0600 09/07/15 0447 09/08/15 0450 09/10/15 0430 09/11/15 0530  WBC 7.7 7.1 7.8 10.5 9.9  HGB 9.0* 8.7* 9.4* 9.0* 9.0*  HCT 28.0* 27.3* 29.5* 27.5* 27.0*  MCV 90.2 90.6 89.7 86.9 87.2  PLT 250 247 259 228 179      Microbiology:  Recent Results (from the past 720 hour(s))   Blood Culture (routine x 2)     Status: None   Collection Time: 08/21/15 12:47 PM  Result Value Ref Range Status   Specimen Description BLOOD UNKNOWN  Final   Special Requests BOTTLES DRAWN AEROBIC AND ANAEROBIC  1CC  Final   Culture NO GROWTH 10 DAYS  Final   Report Status 08/31/2015 FINAL  Final  Blood Culture (routine x 2)     Status: None   Collection Time: 08/21/15 12:47 PM  Result Value Ref Range Status   Specimen Description BLOOD LEFT AC  Final   Special Requests BOTTLES DRAWN AEROBIC AND ANAEROBIC  6CC  Final   Culture NO GROWTH 10 DAYS  Final   Report Status 08/31/2015 FINAL  Final  Urine culture     Status: None   Collection Time: 08/21/15 12:47 PM  Result Value Ref Range Status   Specimen Description URINE, CATHETERIZED  Final   Special Requests Normal  Final   Culture NO GROWTH 2 DAYS  Final   Report Status 08/23/2015 FINAL  Final  MRSA PCR Screening     Status: None   Collection Time: 08/21/15 11:47 PM  Result Value Ref Range Status   MRSA  by PCR NEGATIVE NEGATIVE Final    Comment:        The GeneXpert MRSA Assay (FDA approved for NASAL specimens only), is one component of a comprehensive MRSA colonization surveillance program. It is not intended to diagnose MRSA infection nor to guide or monitor treatment for MRSA infections.   C difficile quick scan w PCR reflex     Status: Abnormal   Collection Time: 08/22/15  2:31 PM  Result Value Ref Range Status   C Diff antigen POSITIVE (A) NEGATIVE Final   C Diff toxin POSITIVE (A) NEGATIVE Final   C Diff interpretation   Final    Positive for toxigenic C. difficile, active toxin production present.    Comment: CRITICAL RESULT CALLED TO, READ BACK BY AND VERIFIED WITH: CHARLIE FLEETWOOD,RN 08/22/2015 1544 BY J RAZZAKSUAREZ,MT   Culture, bal-quantitative     Status: None   Collection Time: 08/25/15  3:15 PM  Result Value Ref Range Status   Specimen Description BRONCHIAL ALVEOLAR LAVAGE  Final   Special Requests  NONE  Final   Gram Stain   Final    GOOD SPECIMEN - 80-90% WBCS FEW WBC SEEN NO ORGANISMS SEEN    Culture HOLDING FOR POSSIBLE PATHOGEN  Final   Report Status 08/27/2015 FINAL  Final  Culture, blood (routine x 2)     Status: None   Collection Time: 09/02/15 12:00 PM  Result Value Ref Range Status   Specimen Description BLOOD RIGHT GROIN  Final   Special Requests   Final    BOTTLES DRAWN AEROBIC AND ANAEROBIC  6CC AERO 10CC ANAERO   Culture NO GROWTH 7 DAYS  Final   Report Status 09/09/2015 FINAL  Final  Culture, blood (routine x 2)     Status: None   Collection Time: 09/02/15 12:15 PM  Result Value Ref Range Status   Specimen Description BLOOD RIGHT GROIN  Final   Special Requests BOTTLES DRAWN AEROBIC AND ANAEROBIC  10 CC  Final   Culture NO GROWTH 7 DAYS  Final   Report Status 09/09/2015 FINAL  Final  Culture, blood (routine x 2)     Status: None   Collection Time: 09/02/15  1:47 PM  Result Value Ref Range Status   Specimen Description BLOOD RIGHT HAND  Final   Special Requests BOTTLES DRAWN AEROBIC AND ANAEROBIC 4CC  Final   Culture NO GROWTH 7 DAYS  Final   Report Status 09/09/2015 FINAL  Final  Culture, blood (routine x 2)     Status: None   Collection Time: 09/02/15  2:06 PM  Result Value Ref Range Status   Specimen Description BLOOD LEFT HAND  Final   Special Requests BOTTLES DRAWN AEROBIC AND ANAEROBIC 1CC  Final   Culture NO GROWTH 7 DAYS  Final   Report Status 09/09/2015 FINAL  Final  MRSA PCR Screening     Status: None   Collection Time: 09/02/15 10:27 PM  Result Value Ref Range Status   MRSA by PCR NEGATIVE NEGATIVE Final    Comment:        The GeneXpert MRSA Assay (FDA approved for NASAL specimens only), is one component of a comprehensive MRSA colonization surveillance program. It is not intended to diagnose MRSA infection nor to guide or monitor treatment for MRSA infections.   Culture, expectorated sputum-assessment     Status: None   Collection  Time: 09/03/15 12:18 AM  Result Value Ref Range Status   Specimen Description SPUTUM  Final   Special Requests  Normal  Final   Sputum evaluation THIS SPECIMEN IS ACCEPTABLE FOR SPUTUM CULTURE  Final   Report Status 09/03/2015 FINAL  Final  Culture, respiratory (NON-Expectorated)     Status: None   Collection Time: 09/03/15 12:18 AM  Result Value Ref Range Status   Specimen Description SPUTUM  Final   Special Requests Normal Reflexed from [id number]  Final   Gram Stain   Final    EXCELLENT SPECIMEN - 90-100% WBCS MANY WBC SEEN MANY GRAM NEGATIVE COCCOBACILLI    Culture MODERATE GROWTH CITROBACTER KOSERI  Final   Report Status 09/05/2015 FINAL  Final   Organism ID, Bacteria CITROBACTER KOSERI  Final      Susceptibility   Citrobacter koseri - MIC*    CEFAZOLIN <=4 SENSITIVE Sensitive     CEFTRIAXONE <=1 SENSITIVE Sensitive     CIPROFLOXACIN <=0.25 SENSITIVE Sensitive     GENTAMICIN <=1 SENSITIVE Sensitive     IMIPENEM <=0.25 SENSITIVE Sensitive     TRIMETH/SULFA <=20 SENSITIVE Sensitive     PIP/TAZO Value in next row Sensitive      SENSITIVE<=4    LEVOFLOXACIN Value in next row Sensitive      SENSITIVE<=0.12    * MODERATE GROWTH CITROBACTER KOSERI  Body fluid culture     Status: None   Collection Time: 09/04/15 10:30 AM  Result Value Ref Range Status   Specimen Description CYTO PLEU  Final   Special Requests NONE  Final   Gram Stain   Final    MODERATE WBC SEEN TOO NUMEROUS TO COUNT RED BLOOD CELLS NO ORGANISMS SEEN    Culture No growth aerobically or anaerobically.  Final   Report Status 09/08/2015 FINAL  Final  Body fluid culture     Status: None (Preliminary result)   Collection Time: 09/07/15 10:55 AM  Result Value Ref Range Status   Specimen Description CYTO PLEU  Final   Special Requests NONE  Final   Gram Stain FEW WBC SEEN NO ORGANISMS SEEN   Final   Culture NO GROWTH 3 DAYS  Final   Report Status PENDING  Incomplete    Coagulation Studies: No results for  input(s): LABPROT, INR in the last 72 hours.  Urinalysis: No results for input(s): COLORURINE, LABSPEC, PHURINE, GLUCOSEU, HGBUR, BILIRUBINUR, KETONESUR, PROTEINUR, UROBILINOGEN, NITRITE, LEUKOCYTESUR in the last 72 hours.  Invalid input(s): APPERANCEUR    Imaging: Ct Chest Wo Contrast  09/09/2015  CLINICAL DATA:  Followup pleural effusion EXAM: CT CHEST WITHOUT CONTRAST TECHNIQUE: Multidetector CT imaging of the chest was performed following the standard protocol without IV contrast. COMPARISON:  09/07/2015 FINDINGS: Mediastinum: The heart size appears normal. There is aortic atherosclerosis noted. Calcification within the LAD coronary artery noted. The trachea is patent and is midline. Normal appearance of the esophagus. No axillary or supraclavicular adenopathy. Lungs/Pleura: There is a left-sided chest tube in place. Loculated left-sided hydro pneumothorax is identified and appears significantly decreased in volume from previous exam. There is compressive type atelectasis and consolidation involving portions of the lingula and left lower lobe. No right pleural effusion. There is a pulmonary nodule within the superior segment of the right lower lobe measuring 1 cm, image 23 of series 3. This is unchanged from 09/07/2015. Upper Abdomen: The adrenal glands are normal. The visualized portions of the liver and spleen are normal. 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 Musculoskeletal: No aggressive lytic or sclerotic bone lesions identified. IMPRESSION: 1. Decrease in volume of loculated left-sided hydropneumothorax with chest tube in  place. 2. Persistent 1 cm nodule in the right lower lobe. Consider followup imaging with nonemergent, outpatient PET-CT. Electronically Signed   By: Signa Kell M.D.   On: 09/09/2015 09:59   Dg Chest Port 1 View  09/10/2015  CLINICAL DATA:  Respiratory failure. EXAM: PORTABLE CHEST 1 VIEW COMPARISON:  Chest x-ray and CT scan dated  09/09/2015 chest x-ray dated 09/08/2015 FINDINGS: PICC remains in good position. Heart size and vascularity are within normal limits. Persistent small loculated effusion and slight atelectasis at the left lung base. Chest tube has been removed. No pneumothorax. IMPRESSION: No change since the prior study except for interval removal of the chest. Electronically Signed   By: Francene Boyers M.D.   On: 09/10/2015 08:01     Medications:   . dextrose 100 mL/hr at 09/11/15 0003   . antiseptic oral rinse  7 mL Mouth Rinse QID  . chlorhexidine gluconate  15 mL Mouth Rinse BID  . feeding supplement (NEPRO CARB STEADY)  237 mL Oral TID WC  . heparin subcutaneous  5,000 Units Subcutaneous 3 times per day  . levofloxacin (LEVAQUIN) IV  750 mg Intravenous Q48H   acetaminophen **OR** acetaminophen, fentaNYL (SUBLIMAZE) injection, haloperidol lactate, hydrALAZINE, Influenza vac split quadrivalent PF, ipratropium-albuterol, ondansetron (ZOFRAN) IV  Assessment/ Plan:  49 y.o. male With alcohol abuse, schizophrenia, seizure disorder, coronary disease/MI 2012 was admitted to [facility] on November 4 after being found unresponsive in his apartment. Urine toxicology screen is positive for benzodiazepines and cannabinoids. Stool positive for C. Difficile  1. Acute renal failure, likely secondary to ATN from concurrent illness, volume depletion, C. difficile, rhabdomyolysis and sepsis.   - Excellent UOP noted, Cr continues to trend down, no acute indication for dialysis, continue to follow renal function trend.  2. Acute respiratory failure due to bacterial pneumonia/Left pleural effusion s/p thoracentesis 11/18. - chest tube 11/22 - Pt doing well post extubation, breathing comfortably.  3.  Hypernatremia. Sodium down to 141.  D/C D5W.  4.  Hypokalemia. K down to 3.3, will give KCL IV today.  5. Infective Colitis A09: c. Diff positive.  - treated with PO vancomycin previously.  6.  Acute rhabdomyolysis  M62.82: Contributed to acute renal failure, resolved with conservative management.   LOS: 21 Ross [name] 11/25/20169:11 AM

## 2015-09-11 NOTE — Care Management Important Message (Signed)
Important Message  Patient Details  Name: Pasty SpillersJames L Midkiff MRN: [id number] Date of Birth: 02/03/1966   Medicare Important Message Given:  Yes    Gwenette GreetBrenda S Dany Walther, RN 09/11/2015, 8:46 AM

## 2015-09-11 NOTE — Evaluation (Signed)
Occupational Therapy Evaluation Patient Details Name: Ross [name] MRN: [id number] DOB: 09/23/1966 Today's Date: 09/11/2015    History of Present Illness presented to ER after being found unresponsive and noted to have seizure-like activity, intubated in ED due to respiratory failure with hypoxia; admitted with sepsis (likely related to c-diff) with multi-organ failure.  Noted with mild elevation in troponin, likely secondary to rhabdomyolysis.  Hospital course additionally significant for placement of R temp femoral dialysis catheter for HD (now removed); self-extubated on 11/9 and transferred to gen medical floor.  Experienced progressive respiratory distress requiring return to CCU and reintubation 11/15-11/22.  Status post thoracentesis 11/18 and 11/21 and placement of L CT 11/21 (removed 11/23).  PT now re-consulted for continued services as medical status improving.   Clinical Impression   Pt seen for OT evaluation.  Pt currently requires max assist for functional rolling activities in bed for LB dressing and use of bed pan requires total assist for clean up, which he needs frequently and had Foley cath removed prior to session from NSG.  He requires set up and cues to initiate tasks such as grooming and min assist to complete and mod assist for UB dressing. Pt is alert and interactive and oriented to person, place and month but was reading it off of board.  He has decreased problem solving skills, decreased attention to task and needs cues to stay on task, and tangential at times with soft spoken voice.  Pt reports that he lives alone and left his wife and has a 8 year old son, but not clear if this is accurate or not.  He lacks insight into his deficits.  No visual deficits noted during screening. He would benefit from skilled OT services to increase ADLs, incorporate balance and attention training during functional tasks, BUE and hand exercises to increase function for ADLs.  Rec CIR  upon DC to continue rehab.    Follow Up Recommendations  CIR    Equipment Recommendations   (to be assessed further based on next venue)    Recommendations for Other Services       Precautions / Restrictions Precautions Precautions: Fall Precaution Comments: Enteric isolation Restrictions Weight Bearing Restrictions: No      Mobility Bed Mobility Overal bed mobility: +2 for physical assistance Bed Mobility: Rolling (Bridging. ) Rolling: +2 for physical assistance;Max assist;Mod assist         General bed mobility comments: mod-max A for bed mobility, rolling and bridigng for bed pan use.   Transfers                 General transfer comment: Session limited by need to void bowels.     Balance                                            ADL Overall ADL's : Needs assistance/impaired Eating/Feeding: Set up;Minimal assistance   Grooming: Wash/dry hands;Wash/dry face;Brushing hair;Minimal assistance;Cueing for sequencing;Sitting;Bed level           Upper Body Dressing : Moderate assistance   Lower Body Dressing: Maximal assistance;+2 for physical assistance               Functional mobility during ADLs: Maximal assistance General ADL Comments: Pt requires max assist for UB and LB bathing and dressing skills, min assist and set up for feeding skills and overall has very  limited endurance for functional tasks and decreased insgiht into deficits; able to use BUE and hands for functional tasks but easily distracted and tangential with mod cues to attend and stay focused; verbalizes quietly and uses humor but not everything stated makes sense or relevant to topic     Vision     Perception     Praxis      Pertinent Vitals/Pain Pain Assessment: 0-10 Pain Score: 8  Pain Location: RLE Pain Descriptors / Indicators: Aching Pain Intervention(s): Limited activity within patient's tolerance;Monitored during session     Hand Dominance   (pt is ambidextrous)   Extremity/Trunk Assessment Upper Extremity Assessment Upper Extremity Assessment: Generalized weakness (decreased fine motor skills and sequencing, problem solving)   Lower Extremity Assessment Lower Extremity Assessment: Defer to PT evaluation       Communication Communication Communication: Other (comment)   Cognition Arousal/Alertness: Awake/alert Behavior During Therapy: WFL for tasks assessed/performed Overall Cognitive Status:  (Pt reports his full name, month November, year 2006, [address], and Investment banker, [employer]. Occational speech that is off topic, often difficult to comprehend. )       Memory: Decreased short-term memory             General Comments       Exercises       Shoulder Instructions      Home Living Family/patient expects to be discharged to:: Inpatient rehab Living Arrangements: Other (Comment)   Type of Home: Apartment Home Access: Level entry     Home Layout: One level               Home Equipment: Cane - single point          Prior Functioning/Environment Level of Independence: Independent with assistive device(s)        Comments: Per patient report, mod indep with use of SPC; does endorse intermittent fall history.  Questionable historian.    OT Diagnosis: Generalized weakness;Cognitive deficits   OT Problem List: Decreased strength;Decreased activity tolerance;Decreased safety awareness;Decreased coordination   OT Treatment/Interventions: Self-care/ADL training;Therapeutic activities;Therapeutic exercise    OT Goals(Current goals can be found in the care plan section) Acute Rehab OT Goals Patient Stated Goal: "to get to a pool or some exercise equipment" OT Goal Formulation: With patient Time For Goal Achievement: 09/25/15 Potential to Achieve Goals: Good ADL Goals Pt Will Perform Eating: with supervision;sitting;with set-up Pt Will Perform Grooming: with set-up;with supervision Pt Will  Perform Upper Body Dressing: with supervision;with set-up;sitting Pt Will Perform Lower Body Dressing: sit to/from stand;with mod assist;with set-up  OT Frequency: Min 1X/week   Barriers to D/C:    lives at home alone       Co-evaluation              End of Session Nurse Communication:  (pt on bed pan)  Activity Tolerance: Patient limited by fatigue Patient left: in bed;with bed alarm set   Time: 1405-1440 OT Time Calculation (min): 35 min Charges:  OT General Charges $OT Visit: 1 Procedure OT Evaluation $Initial OT Evaluation Tier I: 1 Procedure OT Treatments $Self Care/Home Management : 8-22 mins G-Codes:    Wofford,Susan 09/11/2015, 2:49 PM    Susanne BordersSusan Wofford, OTR/L ascom [phone]

## 2015-09-11 NOTE — Clinical Social Work Note (Signed)
CSW consulted for SNF. Bed search has been initiated. [facility] is pending. CSW met with pt to find out if interested in SNF. PT is recommending CIR. Will follow up with bed choice. CSW will continue to follow.   Darden Dates, MSW, LCSW Clinical Social Worker  [phone]

## 2015-09-11 NOTE — Plan of Care (Signed)
Problem: Safety: Goal: Ability to remain free from injury will improve Outcome: Progressing No injury today.   Problem: Skin Integrity: Goal: Risk for impaired skin integrity will decrease Outcome: Progressing Foam pads remain on bil heels  Problem: Activity: Goal: Risk for activity intolerance will decrease Outcome: Progressing Pt/ot and speech saw pt today for good workout  Problem: Fluid Volume: Goal: Ability to maintain a balanced intake and output will improve Outcome: Progressing ivfs d/cd today/ abx cont.  Problem: Nutrition: Goal: Adequate nutrition will be maintained Outcome: Progressing Increased diet to dysphagia 3 .  tol fair  Problem: Bowel/Gastric: Goal: Will not experience complications related to bowel motility Outcome: Progressing Rectal tube  Out today.  Bulb intact no s/s bleeding  Noted via rectum/  Med soft   Green stool.  Using bedpan at times for urges  To try  And have  Bm. Foley cather d/cd today.   Pt voided  Post foley out in urinal.

## 2015-09-11 NOTE — Plan of Care (Signed)
Problem: Education: Goal: Knowledge of [facility] Education information/materials will improve Outcome: Progressing Discussed pain scale, but pt denies pain at this time. Had to reorient pt to situation.  Pt unaware of rectal tube and foley placement at times.     Problem: Safety: Goal: Ability to remain free from injury will improve Outcome: Progressing Pt remains free from injury. Remains in bed.  Bed in lowest position. Bell alarm on.  Call bell and phone within reach.

## 2015-09-11 NOTE — Progress Notes (Signed)
PULMONARY / CRITICAL CARE MEDICINE   Name: Ross [name] MRN: [id number] DOB: Nov 17, 1965    ADMISSION DATE:  08/21/2015 CONSULTATION DATE:  11/05  PT PROFILE: [age] M admitted 11/04 with AMS, acute respiratory failure, possible seizures, recent C diff infection, AKI on CKD. Hospitalization c/b HAP, parapneumonic effusion, failed self-extubation X 2, finally tolerated extubation on 11/22   SUBJECTIVE: No acute events overnight, currently stable on the medical floor, able to cough and clear secretions.  Awake and verbalizing needs today.   MAJOR EVENTS/TEST RESULTS 11/4 intubated 11/5 failed vent wean due to encephalopathy and acidosis 11/6 RIJ CVL, R Fem Vein VasCath 11/8 bronchoscopy with BAL 11/8 thoracentesis, 1800 cc removed.  11/9 failed self extubation 11/15 transferred back to ICU 11/15 intubated 11/18 Thoracentesis - 1800 cc serosanguinous fluid removed. Exudative chemistries. Cultures negative 11/21 thoracentesis - loculations on US. 35 cc serosanguinous fluid removed. 11/21 CT chest: IMPRESSION: Large left pleural effusion is associated with compressive atelectasis of much of the left lung. Endobronchial lesion in the left lower lobe bronchus is not excluded. 11/21 L chest tube placed 11/22 Extubated. tPA to L chest tube 11/23 Repeat CT chest: Decrease in volume of loculated left-sided pleural effusion with chest tube in place. 11/23 chest tube removed  INDWELLING DEVICES:: ETT 11/04 >> 11/09, 11/09 >> out, 11/15 >> 11/22 R IJ CVL 11/06 >> 11/16 R femoral HD cath 11/06 >> 11/16 LUE PICC 11/16 >>   MICRO DATA: Results for orders placed or performed during the hospital encounter of 08/21/15  Blood Culture (routine x 2)     Status: None   Collection Time: 08/21/15 12:47 PM  Result Value Ref Range Status   Specimen Description BLOOD UNKNOWN  Final   Special Requests BOTTLES DRAWN AEROBIC AND ANAEROBIC  1CC  Final   Culture NO GROWTH 10 DAYS  Final   Report Status  08/31/2015 FINAL  Final  Blood Culture (routine x 2)     Status: None   Collection Time: 08/21/15 12:47 PM  Result Value Ref Range Status   Specimen Description BLOOD LEFT AC  Final   Special Requests BOTTLES DRAWN AEROBIC AND ANAEROBIC  6CC  Final   Culture NO GROWTH 10 DAYS  Final   Report Status 08/31/2015 FINAL  Final  Urine culture     Status: None   Collection Time: 08/21/15 12:47 PM  Result Value Ref Range Status   Specimen Description URINE, CATHETERIZED  Final   Special Requests Normal  Final   Culture NO GROWTH 2 DAYS  Final   Report Status 08/23/2015 FINAL  Final  MRSA PCR Screening     Status: None   Collection Time: 08/21/15 11:47 PM  Result Value Ref Range Status   MRSA by PCR NEGATIVE NEGATIVE Final    Comment:        The GeneXpert MRSA Assay (FDA approved for NASAL specimens only), is one component of a comprehensive MRSA colonization surveillance program. It is not intended to diagnose MRSA infection nor to guide or monitor treatment for MRSA infections.   C difficile quick scan w PCR reflex     Status: Abnormal   Collection Time: 08/22/15  2:31 PM  Result Value Ref Range Status   C Diff antigen POSITIVE (A) NEGATIVE Final   C Diff toxin POSITIVE (A) NEGATIVE Final   C Diff interpretation   Final    Positive for toxigenic C. difficile, active toxin production present.    Comment: CRITICAL RESULT CALLED TO, READ BACK  BY AND VERIFIED WITH: CHARLIE FLEETWOOD,RN 08/22/2015 1544 BY J RAZZAKSUAREZ,MT   Culture, bal-quantitative     Status: None   Collection Time: 08/25/15  3:15 PM  Result Value Ref Range Status   Specimen Description BRONCHIAL ALVEOLAR LAVAGE  Final   Special Requests NONE  Final   Gram Stain   Final    GOOD SPECIMEN - 80-90% WBCS FEW WBC SEEN NO ORGANISMS SEEN    Culture HOLDING FOR POSSIBLE PATHOGEN  Final   Report Status 08/27/2015 FINAL  Final  Culture, blood (routine x 2)     Status: None   Collection Time: 09/02/15 12:00 PM   Result Value Ref Range Status   Specimen Description BLOOD RIGHT GROIN  Final   Special Requests   Final    BOTTLES DRAWN AEROBIC AND ANAEROBIC  6CC AERO 10CC ANAERO   Culture NO GROWTH 7 DAYS  Final   Report Status 09/09/2015 FINAL  Final  Culture, blood (routine x 2)     Status: None   Collection Time: 09/02/15 12:15 PM  Result Value Ref Range Status   Specimen Description BLOOD RIGHT GROIN  Final   Special Requests BOTTLES DRAWN AEROBIC AND ANAEROBIC  10 CC  Final   Culture NO GROWTH 7 DAYS  Final   Report Status 09/09/2015 FINAL  Final  Culture, blood (routine x 2)     Status: None   Collection Time: 09/02/15  1:47 PM  Result Value Ref Range Status   Specimen Description BLOOD RIGHT HAND  Final   Special Requests BOTTLES DRAWN AEROBIC AND ANAEROBIC 4CC  Final   Culture NO GROWTH 7 DAYS  Final   Report Status 09/09/2015 FINAL  Final  Culture, blood (routine x 2)     Status: None   Collection Time: 09/02/15  2:06 PM  Result Value Ref Range Status   Specimen Description BLOOD LEFT HAND  Final   Special Requests BOTTLES DRAWN AEROBIC AND ANAEROBIC 1CC  Final   Culture NO GROWTH 7 DAYS  Final   Report Status 09/09/2015 FINAL  Final  MRSA PCR Screening     Status: None   Collection Time: 09/02/15 10:27 PM  Result Value Ref Range Status   MRSA by PCR NEGATIVE NEGATIVE Final    Comment:        The GeneXpert MRSA Assay (FDA approved for NASAL specimens only), is one component of a comprehensive MRSA colonization surveillance program. It is not intended to diagnose MRSA infection nor to guide or monitor treatment for MRSA infections.   Culture, expectorated sputum-assessment     Status: None   Collection Time: 09/03/15 12:18 AM  Result Value Ref Range Status   Specimen Description SPUTUM  Final   Special Requests Normal  Final   Sputum evaluation THIS SPECIMEN IS ACCEPTABLE FOR SPUTUM CULTURE  Final   Report Status 09/03/2015 FINAL  Final  Culture, respiratory  (NON-Expectorated)     Status: None   Collection Time: 09/03/15 12:18 AM  Result Value Ref Range Status   Specimen Description SPUTUM  Final   Special Requests Normal Reflexed from [id number]  Final   Gram Stain   Final    EXCELLENT SPECIMEN - 90-100% WBCS MANY WBC SEEN MANY GRAM NEGATIVE COCCOBACILLI    Culture MODERATE GROWTH CITROBACTER KOSERI  Final   Report Status 09/05/2015 FINAL  Final   Organism ID, Bacteria CITROBACTER KOSERI  Final      Susceptibility   Citrobacter koseri - MIC*    CEFAZOLIN <=4  SENSITIVE Sensitive     CEFTRIAXONE <=1 SENSITIVE Sensitive     CIPROFLOXACIN <=0.25 SENSITIVE Sensitive     GENTAMICIN <=1 SENSITIVE Sensitive     IMIPENEM <=0.25 SENSITIVE Sensitive     TRIMETH/SULFA <=20 SENSITIVE Sensitive     PIP/TAZO Value in next row Sensitive      SENSITIVE<=4    LEVOFLOXACIN Value in next row Sensitive      SENSITIVE<=0.12    * MODERATE GROWTH CITROBACTER KOSERI  Body fluid culture     Status: None   Collection Time: 09/04/15 10:30 AM  Result Value Ref Range Status   Specimen Description CYTO PLEU  Final   Special Requests NONE  Final   Gram Stain   Final    MODERATE WBC SEEN TOO NUMEROUS TO COUNT RED BLOOD CELLS NO ORGANISMS SEEN    Culture No growth aerobically or anaerobically.  Final   Report Status 09/08/2015 FINAL  Final  Body fluid culture     Status: None   Collection Time: 09/07/15 10:55 AM  Result Value Ref Range Status   Specimen Description CYTO PLEU  Final   Special Requests NONE  Final   Gram Stain FEW WBC SEEN NO ORGANISMS SEEN   Final   Culture No growth aerobically or anaerobically.  Final   Report Status 09/11/2015 FINAL  Final     ANTIMICROBIALS:  Anti-infectives    Start     Dose/Rate Route Frequency Ordered Stop   09/05/15 1800  levofloxacin (LEVAQUIN) IVPB 750 mg     750 mg 100 mL/hr over 90 Minutes Intravenous Every 48 hours 09/05/15 1544     09/04/15 1200  vancomycin (VANCOCIN) IVPB 1000 mg/200 mL premix  Status:   Discontinued     1,000 mg 200 mL/hr over 60 Minutes Intravenous Every M-W-F (Hemodialysis) 09/02/15 1418 09/04/15 1059   09/03/15 2000  anidulafungin (ERAXIS) 100 mg in sodium chloride 0.9 % 100 mL IVPB  Status:  Discontinued     100 mg over 90 Minutes Intravenous Every 24 hours 09/02/15 1620 09/07/15 1013   09/02/15 2000  anidulafungin (ERAXIS) 200 mg in sodium chloride 0.9 % 200 mL IVPB     200 mg over 180 Minutes Intravenous  Once 09/02/15 1812 09/02/15 2307   09/02/15 1630  anidulafungin (ERAXIS) 200 mg in sodium chloride 0.9 % 200 mL IVPB  Status:  Discontinued     200 mg over 180 Minutes Intravenous  Once 09/02/15 1620 09/02/15 2132   09/02/15 1600  piperacillin-tazobactam (ZOSYN) IVPB 3.375 g  Status:  Discontinued     3.375 g 12.5 mL/hr over 240 Minutes Intravenous Every 12 hours 09/02/15 1053 09/02/15 1307   09/02/15 1430  vancomycin (VANCOCIN) 2,000 mg in sodium chloride 0.9 % 500 mL IVPB     2,000 mg 250 mL/hr over 120 Minutes Intravenous  Once 09/02/15 1416 09/02/15 1820   09/02/15 1315  piperacillin-tazobactam (ZOSYN) IVPB 3.375 g  Status:  Discontinued     3.375 g 12.5 mL/hr over 240 Minutes Intravenous Every 12 hours 09/02/15 1307 09/05/15 1447   08/29/15 1700  vancomycin (VANCOCIN) IVPB 1000 mg/200 mL premix  Status:  Discontinued     1,000 mg 200 mL/hr over 60 Minutes Intravenous every 72 hours 08/26/15 1555 08/27/15 1023   08/27/15 1000  meropenem (MERREM) 1 g in sodium chloride 0.9 % 100 mL IVPB  Status:  Discontinued     1 g 200 mL/hr over 30 Minutes Intravenous Every 24 hours 08/26/15 1555 08/27/15 1023  08/26/15 1800  vancomycin (VANCOCIN) 500 mg in sodium chloride irrigation 0.9 % 100 mL ENEMA  Status:  Discontinued     500 mg Rectal 4 times per day 08/26/15 1531 08/27/15 1023   08/26/15 1700  vancomycin (VANCOCIN) IVPB 1000 mg/200 mL premix     1,000 mg 200 mL/hr over 60 Minutes Intravenous  Once 08/26/15 1555 08/26/15 1723   08/25/15 1600  meropenem (MERREM)  1 g in sodium chloride 0.9 % 100 mL IVPB  Status:  Discontinued     1 g 200 mL/hr over 30 Minutes Intravenous Every 12 hours 08/25/15 1545 08/26/15 1555   08/25/15 1600  vancomycin (VANCOCIN) IVPB 1000 mg/200 mL premix  Status:  Discontinued     1,000 mg 200 mL/hr over 60 Minutes Intravenous Every 24 hours 08/25/15 1545 08/26/15 1555   08/24/15 1200  vancomycin (VANCOCIN) 50 mg/mL oral solution 125 mg  Status:  Discontinued     125 mg Oral 4 times per day 08/24/15 1035 09/05/15 1447   08/22/15 2000  vancomycin (VANCOCIN) IVPB 1000 mg/200 mL premix  Status:  Discontinued     1,000 mg 200 mL/hr over 60 Minutes Intravenous Every 24 hours 08/21/15 1439 08/22/15 0956   08/22/15 1800  vancomycin (VANCOCIN) 50 mg/mL oral solution 500 mg  Status:  Discontinued     500 mg Oral 4 times per day 08/22/15 1547 08/24/15 1035   08/22/15 1600  vancomycin (VANCOCIN) IVPB 1000 mg/200 mL premix  Status:  Discontinued     1,000 mg 200 mL/hr over 60 Minutes Intravenous Every 18 hours 08/22/15 0956 08/22/15 1001   08/22/15 1600  vancomycin (VANCOCIN) 1,250 mg in sodium chloride 0.9 % 250 mL IVPB  Status:  Discontinued     1,250 mg 166.7 mL/hr over 90 Minutes Intravenous Every 18 hours 08/22/15 1001 08/23/15 0827   08/22/15 1200  vancomycin (VANCOCIN) 50 mg/mL oral solution 125 mg  Status:  Discontinued     125 mg Oral 4 times per day 08/22/15 1035 08/22/15 1550   08/21/15 2200  piperacillin-tazobactam (ZOSYN) IVPB 3.375 g  Status:  Discontinued     3.375 g 12.5 mL/hr over 240 Minutes Intravenous 3 times per day 08/21/15 1431 08/23/15 0827   08/21/15 2000  vancomycin (VANCOCIN) IVPB 1000 mg/200 mL premix     1,000 mg 200 mL/hr over 60 Minutes Intravenous  Once 08/21/15 1439 08/21/15 2248   08/21/15 1300  piperacillin-tazobactam (ZOSYN) IVPB 3.375 g     3.375 g 100 mL/hr over 30 Minutes Intravenous  Once 08/21/15 1245 08/21/15 1457   08/21/15 1300  vancomycin (VANCOCIN) IVPB 1000 mg/200 mL premix     1,000  mg 200 mL/hr over 60 Minutes Intravenous  Once 08/21/15 1245 08/21/15 1457       VITAL SIGNS: BP 139/78 mmHg  Pulse 75  Temp(Src) 99.3 F (37.4 C) (Oral)  Resp 18  Ht  (1.803 m)  Wt 232 lb 12.8 oz (105.597 kg)  BMI 32.48 kg/m2  SpO2 99%  HEMODYNAMICS:    VENTILATOR SETTINGS:    INTAKE / OUTPUT: I/O last 3 completed shifts: In: 720 [P.O.:120; I.V.:600] Out: 3275 [Urine:3075; Stool:200]  PHYSICAL EXAMINATION: General: RASS 0, + F/C Neuro: CNs intact, MAEs HEENT: NCAT, WNL Cardiovascular: reg, no M Lungs:diminished on L (but with good air movement) Abdomen: soft, NT, +BS Ext: warm, no edema  LABS:  CBC  Recent Labs Lab 09/08/15 0450 09/10/15 0430 09/11/15 0530  WBC 7.8 10.5 9.9  HGB 9.4* 9.0* 9.0*  HCT 29.5* 27.5* 27.0*  PLT 259 228 179   Coag's No results for input(s): APTT, INR in the last 168 hours. BMET  Recent Labs Lab 09/09/15 0500 09/10/15 0430 09/11/15 0530  NA 147* 144 141  K 4.0 3.7 3.3*  CL 117* 115* 112*  CO2 20* 20* 21*  BUN 55* 47* 40*  CREATININE 3.67* 3.51* 3.13*  GLUCOSE 113* 105* 97   Electrolytes  Recent Labs Lab 09/09/15 0500 09/10/15 0430 09/11/15 0530  CALCIUM 9.2 9.1 8.6*   Sepsis Markers No results for input(s): LATICACIDVEN, PROCALCITON, O2SATVEN in the last 168 hours. ABG  Recent Labs Lab 09/06/15 0400 09/07/15 0414  PHART 7.32* 7.30*  PCO2ART 44 45  PO2ART 137* 165*   Liver Enzymes  Recent Labs Lab 09/05/15 0507 09/08/15 0450  AST 40 40  ALT 49 34  ALKPHOS 208* 446*  BILITOT 0.8 0.5  ALBUMIN 1.8* 1.8*   Cardiac Enzymes No results for input(s): TROPONINI, PROBNP in the last 168 hours. Glucose  Recent Labs Lab 09/09/15 1142 09/09/15 1624 09/10/15 0729 09/10/15 1159 09/10/15 1627 09/10/15 2112  GLUCAP 95 90 99 96 101* 97    Imaging No results found.   ASSESSMENT / PLAN:  PULMONARY A: Prolonged VDRF - extubated 11/22 and tolerating, stable on the medical  floor Citrobacter HAP with large parapneumonic effusion - chest tube placed 11/21, removed 11/23 P:   Supplemental O2 to maintain SpO2 > 92% CXR - with small effusion, stable, no worsening findings Still with secretions, but able to cough and clear secretions.  Last dose of levaquin today  CARDIOVASCULAR A:  No acute issues P:  Monitor hemodynamics  RENAL A:   AKI, nonoliguric - Cr improving CKD P:   Monitor BMET intermittently Monitor I/Os Correct electrolytes as indicated Renal service following  GASTROINTESTINAL A: Post extubation dysphagia - passed SLP eval 11/23  P:   SUP: N/I post extubation Begin diet   HEMATOLOGIC A:   Mild anemia without overt bleeding P:  DVT px: SQ heparin Monitor CBC intermittently Transfuse per usual ICU guidelines  INFECTIOUS A:   C diff colitis, fully treated Citrobacter HAP Complicated parapneumonic effusion Severe sepsis, resolving P:   Monitor temp, WBC count Micro and abx as above Cont levofloxacin through 11/26 per ID recs  ENDOCRINE A:   No issues P:   Monitor glu on chem panels Consider SSI for glu > 180  NEUROLOGIC A:   H/O schizophrenia ? Of seizures - Neuro has seen. Recs no ACDs presently H/O EtOH abuse Agitation, controlled Pain, controlled P:   RASS goal: 0 Cont PRN fentanyl Cont PRN haldol  Follow up with Dr. Sung Amabile at [facility] in 2-3 weeks after discharge.   Thank you for consulting [facility], we will signoff at this time.  Please feel free to contacts [place] with any questions.    Pulmonary consult time -  Stephanie Acre, MD [facility] Pager [phone] (please enter 7-digits) On Call Pager - [phone] (please enter 7-digits)     09/11/2015, 11:59 AM

## 2015-09-11 NOTE — Progress Notes (Signed)
[facility] [place] at [facility]   PATIENT NAME: Ross [name]    MR#:  [id number]  DATE OF BIRTH:  11-02-1965    CHIEF COMPLAINT:   Chief Complaint  Patient presents with  . Seizures  slowly improving. Rectal tube came out this am  REVIEW OF SYSTEMS:   ROS unable to obtain as patient is not able to speak much  DRUG ALLERGIES:   Allergies  Allergen Reactions  . Antihistamines, Diphenhydramine-Type Anaphylaxis and Rash  . Benadryl [Diphenhydramine Hcl] Anaphylaxis and Rash  . Hydroxyzine Anaphylaxis and Rash    VITALS:  Blood pressure 147/79, pulse 79, temperature 98.1 F (36.7 C), temperature source Oral, resp. rate 18, height 5\' 11"  (1.803 m), weight 105.597 kg (232 lb 12.8 oz), SpO2 99 %.  PHYSICAL EXAMINATION:  GENERAL:  49 y.o.-year-old patient recently extubated, weak, short shallow breaths HEENT: Pupils equal round and reactive, oral mucous membranes are moist, orally intubated Pulmonary: Short shallow breaths, good air movement, upper airway congestion, no wheezing  CARDIOVASCULAR: S1, S2 normal. No murmurs, rubs, or gallops. ABDOMEN: Soft, nontender, nondistended. Bowel sounds present. No organomegaly or mass.  EXTREMITIES: +1 edema bilaterally edema. No cyanosis, or clubbing. Peripheral pulses 2+ NEUROLOGIC: Cranial nerves grossly intact, moving all 4 extremities, responds to simple commands, SKIN: No obvious rash, lesion, or ulcer.  Psych: Lethargic  LABORATORY PANEL:   CBC  Recent Labs Lab 09/11/15 0530  WBC 9.9  HGB 9.0*  HCT 27.0*  PLT 179   ------------------------------------------------------------------------------------------------------------------  Chemistries   Recent Labs Lab 09/08/15 0450  09/11/15 0530  NA 146*  < > 141  K 5.0  < > 3.3*  CL 116*  < > 112*  CO2 23  < > 21*  GLUCOSE 129*  < > 97  BUN 59*  < > 40*  CREATININE 3.82*  < > 3.13*  CALCIUM 9.3  < > 8.6*  AST 40  --   --   ALT 34  --    --   ALKPHOS 446*  --   --   BILITOT 0.5  --   --   < > = values in this interval not displayed. ------------------------------------------------------------------------------------------------------------------  Cardiac Enzymes No results for input(s): TROPONINI in the last 168 hours. ------------------------------------------------------------------------------------------------------------------  RADIOLOGY:  Dg Chest Port 1 View  09/10/2015  CLINICAL DATA:  Respiratory failure. EXAM: PORTABLE CHEST 1 VIEW COMPARISON:  Chest x-ray and CT scan dated 09/09/2015 chest x-ray dated 09/08/2015 FINDINGS: PICC remains in good position. Heart size and vascularity are within normal limits. Persistent small loculated effusion and slight atelectasis at the left lung base. Chest tube has been removed. No pneumothorax. IMPRESSION: No change since the prior study except for interval removal of the chest. Electronically Signed   By: Francene BoyersJames  Maxwell M.D.   On: 09/10/2015 08:01    EKG:   Orders placed or performed during the hospital encounter of 08/21/15  . EKG 12-Lead  . EKG 12-Lead    ASSESSMENT AND PLAN:   * Hypernatremia: Na slowly creeping up (147 Today), will try gentle fluid (d5w) and recheck Na in am.  * acute respiratory failure with hypoxia: (VDRF) - Pulmonary CCM following - Intubated 11/4 through 11/9 and then re-intubated 11/15 extubated 11/22 - pneumonia pleural effusion, thoracentesis 11/18 with removal of 1.8 L of fluid, exudative effusion, reaccumulation and chest tube placed today 11/21 - Sputum culture growing Citrobacter covering with Levaquin stop date 11/26 (tomorrow)  - pleural culture had no bacterial growth  *  HAP with large parapneumonic effusion - chest tube placed 11/21, removed 11/23 Supplemental O2 to maintain SpO2 > 92% chest tube removed on 11/23 by PCCM  * Septic shock secondary to Hospital Acquired pneumonia and also possible line sepsis: See above.  - Left  neck line removed, right groin hemodialysis catheter removed - has PICC line in place - REMOVE ON D/C   * Rhabdomyolysis: Resolved - Possibly due to prolonged immobilization, hypotension, diarrhea, possible withdrawal seizures prior to admission.   * Acute renal failure: secondary to ATN from one depletion, rhabdomyolysis, C. Difficile. Good urine output. No need for hemodialysis at this time. 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 Albumin very low. Continue to monitor  * left leg edema;dvt study is negative.  * anxiety, mental disorder with PTSD: .schizophrenia;consult psych if need  Discontinue rectal tube (was already out) and foley  -> Disposition. Care management has been discussing possible LTAC placement with the family. Currently the family is in favor of him keeping him here.  CODE STATUS: Full   TOTAL TIME TAKING CARE OF THIS PATIENT: 35 minutes.    POSSIBLE D/C 2-3 DAYS, DEPENDING ON CLINICAL CONDITION.   [facility], Synethia Endicott M.D on 09/11/2015 at 2:12 PM  Between 7am to 6pm - Pager - [phone]  After 6pm go to [email or web address] - password [email or web address] [facility]  [facility]  Office  [phone]  CC: Primary care physician; No primary care provider on file.

## 2015-09-11 NOTE — Progress Notes (Signed)
Nutrition Follow-up   INTERVENTION:   Meals and Snacks: Cater to patient preference. Pt requiring assistance at meal times. Medical Food Supplement Therapy:  Will switch to Ensure TID as pt not fond of Nepros, Ensure Enlive po TID, each supplement provides 350 kcal and 20 grams of protein, on meal trays as pt on isolation.   NUTRITION DIAGNOSIS:   Inadequate oral intake related to acute illness as evidenced by NPO status; improving with diet advacement  GOAL:   Patient will meet greater than or equal to 90% of their needs; ongoing  MONITOR:    (Energy intake, Digestive system, Electrolyte and renal profile)  REASON FOR ASSESSMENT:   Consult Enteral/tube feeding initiation and management  ASSESSMENT:    Per MD note, pt s/p chest tube removal on 11/23. Pt continues with rectal tube and foley, not requiring HD at this time; nephrology following. Pt remains on isolation; c.diff positive. Possible LTAC placement at discharge per MD note.  Diet Order:  DIET DYS 2 Room service appropriate?: Yes with Assist; Fluid consistency:: Thin    Current Nutrition: Per CNA Ross [name] pt ate 40% of breakfast this am, Nepro not on tray. Pt reports Nepro gives him gas. RN Ross [name] providing encouragement for supplementation this am. Last recorded po intake 75% of lunch yesterday.   Gastrointestinal Profile: Last BM: 09/11/2015 loose stool via rectal tube   Scheduled Medications:  . antiseptic oral rinse  7 mL Mouth Rinse QID  . chlorhexidine gluconate  15 mL Mouth Rinse BID  . feeding supplement (ENSURE ENLIVE)  237 mL Oral TID WC  . heparin subcutaneous  5,000 Units Subcutaneous 3 times per day  . levofloxacin (LEVAQUIN) IV  750 mg Intravenous Q48H  . potassium chloride  10 mEq Intravenous Q1 Hr x 4    Electrolyte/Renal Profile and Glucose Profile:   Recent Labs Lab 09/09/15 0500 09/10/15 0430 09/11/15 0530  NA 147* 144 141  K 4.0 3.7 3.3*  CL 117* 115* 112*  CO2 20* 20* 21*  BUN 55*  47* 40*  CREATININE 3.67* 3.51* 3.13*  CALCIUM 9.2 9.1 8.6*  GLUCOSE 113* 105* 97   Protein Profile:  Recent Labs Lab 09/05/15 0507 09/08/15 0450  ALBUMIN 1.8* 1.8*     Weight Trend since Admission: Filed Weights   09/09/15 0415 09/10/15 0424 09/11/15 0500  Weight: 229 lb 11.5 oz (104.2 kg) 234 lb 5.6 oz (106.3 kg) 232 lb 12.8 oz (105.597 kg)    BMI:  Body mass index is 32.48 kg/(m^2).  Estimated Nutritional Needs:   Kcal:  BEE 1677 kcals (IF 1.1-1.4, AF 1.2) 2213-2817 kcals/d. Using IBW of 78kg  Protein:  (1.2-1.5 g/kg) 94-117 g/d Using IBW of 78kg  Fluid:  (25-1130ml/kg) 1950-237440ml/d Using IBw of 78kg  EDUCATION NEEDS:   No education needs identified at this time   MODERATE Care Level  Ross [name], RD, LDN Pager [phone]) [phone]

## 2015-09-11 NOTE — Progress Notes (Signed)
Speech Language Pathology Treatment: Dysphagia  Patient Details Name: Pasty SpillersJames L Caillier MRN: [id number] DOB: 06-Oct-1966 Today's Date: 09/11/2015 Time: 1500-1550 SLP Time Calculation (min) (ACUTE ONLY): 50 min  Assessment / Plan / Recommendation Clinical Impression  Pt presents w/mild risk of aspiration. Pt is more alert today and conversing with ST. Pt demonstrated no overt s/s of aspiration w/puree, mech soft or thin liquids by cup and straw. Vocal quality remained clear throughout interaction despite pt's low volume. Pt able to masticate and clear mech soft consistency well. No pocketing or holding was observed. Mastication appeared timely and adequate. Recommend upgrading diet to Dysphagia III w/thin liquids. Continue w/aspiration precautions. Will f/u re: toleration of diet in 1-3 days. Nsg and pt in agreement.    HPI HPI: Pt is a 49 y.o. male with a known c/o "heavy" alcohol abuse, schizophrenia, seizure disorder, coronary artery disease status post MI in 2012 resents by EMS after being found unresponsive in his apartment. He has had no contact with anyone for at least 24 hours. EMS reported seizure-like activity on arrival and during transport. On arrival to the emergency room he was able to state his name and follow simple Commands. He was very lethargic and responding minimally. He was hypoxic with oxygen saturations of 88% on 6 L. He was intubated in the emergency room by Dr. York CeriseForbach due to respiratory failure with hypoxia. On presentation his clothes were soaked with urine and loose feces. He is being admitted for sepsis with high fever of 103, lactate of 2.3, multiorgan failure. Pt self extubated and is currently NPO; more awake and following commands. Pt was reintubated on 11.15.16 and extubated again on 11.22.16. He also has a chest tube in place on his Left side. Pt is awaken and conversing w/ST.        SLP Plan  Continue with current plan of care     Recommendations  Diet  recommendations: Dysphagia 3 (mechanical soft);Thin liquid Liquids provided via: Cup Medication Administration: Whole meds with puree Supervision: Staff to assist with self feeding Compensations: Minimize environmental distractions;Slow rate;Small sips/bites Postural Changes and/or Swallow Maneuvers: Seated upright 90 degrees              General recommendations: OT consult;PT consult Oral Care Recommendations: Oral care BID;Staff/trained caregiver to provide oral care Follow up Recommendations: Other (comment) (TBD) Plan: Continue with current plan of care   Balfour,Maaran 09/11/2015, 4:07 PM

## 2015-09-12 LAB — BASIC METABOLIC PANEL
Anion gap: 7 (ref 5–15)
BUN: 37 mg/dL — AB (ref 6–20)
CHLORIDE: 113 mmol/L — AB (ref 101–111)
CO2: 21 mmol/L — AB (ref 22–32)
CREATININE: 2.92 mg/dL — AB (ref 0.61–1.24)
Calcium: 8.6 mg/dL — ABNORMAL LOW (ref 8.9–10.3)
GFR calc Af Amer: 28 mL/min — ABNORMAL LOW (ref >60)
GFR calc non Af Amer: 24 mL/min — ABNORMAL LOW (ref >60)
GLUCOSE: 86 mg/dL (ref 65–99)
POTASSIUM: 3.6 mmol/L (ref 3.5–5.1)
SODIUM: 141 mmol/L (ref 135–145)

## 2015-09-12 LAB — CBC
HCT: 27.9 % — ABNORMAL LOW (ref 40.0–52.0)
HEMOGLOBIN: 9.3 g/dL — AB (ref 13.0–18.0)
MCH: 28.9 pg (ref 26.0–34.0)
MCHC: 33.5 g/dL (ref 32.0–36.0)
MCV: 86.4 fL (ref 80.0–100.0)
Platelets: 180 10*3/uL (ref 150–440)
RBC: 3.23 MIL/uL — AB (ref 4.40–5.90)
RDW: 13.9 % (ref 11.5–14.5)
WBC: 8.3 10*3/uL (ref 3.8–10.6)

## 2015-09-12 LAB — GLUCOSE, CAPILLARY: GLUCOSE-CAPILLARY: 89 mg/dL (ref 65–99)

## 2015-09-12 NOTE — Progress Notes (Signed)
Speech Language Pathology Dysphagia Treatment Patient Details Name: Pasty SpillersJames L Karras MRN: [id number] DOB: [birth year] Today's Date: 09/12/2015 Time: [id number] SLP Time Calculation (min) (ACUTE ONLY): 18 min  Assessment / Plan / Recommendation Clinical Impression   pt continues to have a slow rate of a-p transit and decreased mastication. Pt appeared confused this visit and discussed need to go swimming today and asking PT to help him swim. Pt was wfl with current diet. ST recommends to remain on current diet and educated pt and nursing on recommendations.    Diet Recommendation    ndds3 with thin   SLP Plan Continue with current plan of care   Pertinent Vitals/Pain No pain reported   Swallowing Goals     General Behavior/Cognition: Alert;Confused Patient Positioning: Upright in bed Oral care provided: N/A HPI: Pt is a 49 y.o. male with a known c/o "heavy" alcohol abuse, schizophrenia, seizure disorder, coronary artery disease status post MI in 2012 resents by EMS after being found unresponsive in his apartment. He has had no contact with anyone for at least 24 hours. EMS reported seizure-like activity on arrival and during transport. On arrival to the emergency room he was able to state his name and follow simple Commands. He was very lethargic and responding minimally. He was hypoxic with oxygen saturations of 88% on 6 L. He was intubated in the emergency room by Dr. York CeriseForbach due to respiratory failure with hypoxia. On presentation his clothes were soaked with urine and loose feces. He is being admitted for sepsis with high fever of 103, lactate of 2.3, multiorgan failure. Pt self extubated and is currently NPO; more awake and following commands. Pt was reintubated on 11.15.16 and extubated again on 11.22.16. He also has a chest tube in place on his Left side. Pt is awaken and conversing w/ST.    Oral Cavity - Oral Hygiene     Dysphagia Treatment Treatment Methods: Skilled  observation;Upgraded PO texture trial;Patient/caregiver education Patient observed directly with PO's: Yes Type of PO's observed: Dysphagia 3 (soft);Thin liquids Feeding: Able to feed self Liquids provided via: Cup;Straw Amount of cueing: Independent   GO     Joycelyn RuaStacie Harris Sauber 09/12/2015, 12:26 PM

## 2015-09-12 NOTE — Plan of Care (Signed)
Problem: Fluid Volume: Goal: Hemodynamic stability will improve Outcome: Progressing Improving slowly.ivfs d/cd  Yesterday. moniter d/cd today. Denies pain  Problem: Safety: Goal: Ability to remain free from injury will improve Outcome: Progressing Free from injury this shift. Upper ext stronger than lower ext bil. Legs still a little wobbly.  Pt  Got oob to chair today tol well.  2 assists to get back to bed.   Problem: Health Behavior/Discharge Planning: Goal: Ability to manage health-related needs will improve Outcome: Progressing coherant at times. At times pt will says things confusedly. Speaks in  A soft tone.  Voiding well  Post foley removal on yesterday.  Rectal tube out and  Pt  Having  Soft  Stools.   Problem: Pain Managment: Goal: General experience of comfort will improve Outcome: Progressing Denies pain  Problem: Skin Integrity: Goal: Risk for impaired skin integrity will decrease Outcome: Progressing Skin intact.  Problem: Tissue Perfusion: Goal: Risk factors for ineffective tissue perfusion will decrease Outcome: Progressing Pt/ot and speech working with pt.  afebrile  Problem: Fluid Volume: Goal: Ability to maintain a balanced intake and output will improve Outcome: Progressing Taking liqs well.  Small amt of food intake. Encouraged to eat.  Problem: Bowel/Gastric: Goal: Will not experience complications related to bowel motility Outcome: Progressing See above note with bowels

## 2015-09-12 NOTE — Progress Notes (Signed)
Physical Therapy Treatment Patient Details Name: Ross [name] MRN: [id number] DOB: 1965/11/03 Today's Date: 09/12/2015    History of Present Illness presented to ER after being found unresponsive and noted to have seizure-like activity, intubated in ED due to respiratory failure with hypoxia; admitted with sepsis (likely related to c-diff) with multi-organ failure.  Noted with mild elevation in troponin, likely secondary to rhabdomyolysis.  Hospital course additionally significant for placement of R temp femoral dialysis catheter for HD (now removed); self-extubated on 11/9 and transferred to gen medical floor.  Experienced progressive respiratory distress requiring return to CCU and reintubation 11/15-11/22.  Status post thoracentesis 11/18 and 11/21 and placement of L CT 11/21 (removed 11/23).  PT now re-consulted for continued services as medical status improving.    PT Comments    Pt was able to get up to chair today, using sliding technique and bed pad for assistance.  He is motivated to try to stand but cannot step, and will need to practice more.  Will be going to [facility] per nsg which should work well for him.    Follow Up Recommendations  CIR     Equipment Recommendations  None recommended by PT    Recommendations for Other Services       Precautions / Restrictions Precautions Precautions: Fall Precaution Comments: Enteric isolation Restrictions Weight Bearing Restrictions: No    Mobility  Bed Mobility Overal bed mobility: Needs Assistance Bed Mobility: Rolling;Supine to Sit Rolling: Mod assist   Supine to sit: Max assist;HOB elevated (help for legs then trunk and pad to slide hips)        Transfers Overall transfer level: Needs assistance Equipment used: 1 person hand held assist Transfers: Lateral/Scoot Transfers          Lateral/Scoot Transfers: Max assist;From elevated surface    Ambulation/Gait             General Gait Details:  unable   Careers information officer    Modified Rankin (Stroke Patients Only)       Balance Overall balance assessment: Needs assistance Sitting-balance support: Feet supported Sitting balance-Leahy Scale: Fair (unreliable to sit and leans with no warning)   Postural control: Posterior lean Standing balance support: During functional activity Standing balance-Leahy Scale: Zero                      Cognition Arousal/Alertness: Awake/alert Behavior During Therapy: Restless Overall Cognitive Status: Impaired/Different from baseline Area of Impairment: Attention;Memory;Following commands;Safety/judgement;Awareness;Problem solving   Current Attention Level: Selective Memory: Decreased recall of precautions;Decreased short-term memory Following Commands: Follows one step commands with increased time Safety/Judgement: Decreased awareness of safety;Decreased awareness of deficits Awareness: Intellectual Problem Solving: Slow processing;Difficulty sequencing;Requires verbal cues General Comments: restless and  tries to lean over with no warning in different directions    Exercises      General Comments General comments (skin integrity, edema, etc.): Pt is up in chair after working with PT on sitting control, then slid with pad to chair.  Nurse stated MD wanted pt to walk and assured her that his motor planning was not good enough yet to attempt      Pertinent Vitals/Pain Pain Assessment: No/denies pain    Home Living                      Prior Function            PT  Goals (current goals can now be found in the care plan section) Acute Rehab PT Goals Patient Stated Goal: to get home alone Progress towards PT goals: Progressing toward goals    Frequency  7X/week    PT Plan Current plan remains appropriate    Co-evaluation             End of Session Equipment Utilized During Treatment: Gait belt Activity Tolerance: Patient  limited by fatigue;Other (comment) (poor motor planning) Patient left: in bed;with call bell/phone within reach;with nursing/sitter in room     Time: [id number] PT Time Calculation (min) (ACUTE ONLY): 29 min  Charges:  $Therapeutic Activity: 23-37 mins                    G Codes:      Ross [name], Ross [name] 09/12/2015, 1:57 PM   Ross [name], PT MS Acute Rehab Dept. Number: [facility] [phone] and [facility] [phone]

## 2015-09-12 NOTE — Progress Notes (Signed)
Subjective:  Cr continues to trend down slowly. Cr currently 2.9. UOP 1 liter. K 3.6. Pt resting comfortably in bed this AM.   Objective:  Vital signs in last 24 hours:  Temp:  [98.1 F (36.7 C)-99.9 F (37.7 C)] 99.9 F (37.7 C) (11/26 0445) Pulse Rate:  [75-79] 75 (11/26 0445) Resp:  [18] 18 (11/26 0445) BP: (147-155)/(79-88) 155/87 mmHg (11/26 0445) SpO2:  [99 %] 99 % (11/26 0445) Weight:  [99.927 kg (220 lb 4.8 oz)] 99.927 kg (220 lb 4.8 oz) (11/26 0445)  Weight change: -5.67 kg (-12 lb 8 oz) Filed Weights   09/10/15 0424 09/11/15 0500 09/12/15 0445  Weight: 106.3 kg (234 lb 5.6 oz) 105.597 kg (232 lb 12.8 oz) 99.927 kg (220 lb 4.8 oz)    Intake/Output:    Intake/Output Summary (Last 24 hours) at 09/12/15 1132 Last data filed at 09/11/15 1944  Gross per 24 hour  Intake      0 ml  Output   1050 ml  Net  -1050 ml     Physical Exam: General: No acute distress  HEENT [place]/AT, EOMI OM moist  Neck supple  Pulm/lungs bilateral rhonchi normal effort  CVS/Heart S1S2 no rubs  Abdomen:  soft, NTND BS present  Extremities: + b/l LE edema  Neurologic: Awake, alert, following commands, conversant  Skin: no acute rashes  GU: Foley in place        Basic Metabolic Panel:   Recent Labs Lab 09/08/15 0450 09/09/15 0500 09/10/15 0430 09/11/15 0530 09/12/15 0445  NA 146* 147* 144 141 141  K 5.0 4.0 3.7 3.3* 3.6  CL 116* 117* 115* 112* 113*  CO2 23 20* 20* 21* 21*  GLUCOSE 129* 113* 105* 97 86  BUN 59* 55* 47* 40* 37*  CREATININE 3.82* 3.67* 3.51* 3.13* 2.92*  CALCIUM 9.3 9.2 9.1 8.6* 8.6*     CBC:  Recent Labs Lab 09/07/15 0447 09/08/15 0450 09/10/15 0430 09/11/15 0530 09/12/15 0445  WBC 7.1 7.8 10.5 9.9 8.3  HGB 8.7* 9.4* 9.0* 9.0* 9.3*  HCT 27.3* 29.5* 27.5* 27.0* 27.9*  MCV 90.6 89.7 86.9 87.2 86.4  PLT 247 259 228 179 180      Microbiology:  Recent Results (from the past 720 hour(s))  Blood Culture (routine x 2)     Status: None   Collection Time: 08/21/15 12:47 PM  Result Value Ref Range Status   Specimen Description BLOOD UNKNOWN  Final   Special Requests BOTTLES DRAWN AEROBIC AND ANAEROBIC  1CC  Final   Culture NO GROWTH 10 DAYS  Final   Report Status 08/31/2015 FINAL  Final  Blood Culture (routine x 2)     Status: None   Collection Time: 08/21/15 12:47 PM  Result Value Ref Range Status   Specimen Description BLOOD LEFT AC  Final   Special Requests BOTTLES DRAWN AEROBIC AND ANAEROBIC  6CC  Final   Culture NO GROWTH 10 DAYS  Final   Report Status 08/31/2015 FINAL  Final  Urine culture     Status: None   Collection Time: 08/21/15 12:47 PM  Result Value Ref Range Status   Specimen Description URINE, CATHETERIZED  Final   Special Requests Normal  Final   Culture NO GROWTH 2 DAYS  Final   Report Status 08/23/2015 FINAL  Final  MRSA PCR Screening     Status: None   Collection Time: 08/21/15 11:47 PM  Result Value Ref Range Status   MRSA by PCR NEGATIVE NEGATIVE Final  Comment:        The GeneXpert MRSA Assay (FDA approved for NASAL specimens only), is one component of a comprehensive MRSA colonization surveillance program. It is not intended to diagnose MRSA infection nor to guide or monitor treatment for MRSA infections.   C difficile quick scan w PCR reflex     Status: Abnormal   Collection Time: 08/22/15  2:31 PM  Result Value Ref Range Status   C Diff antigen POSITIVE (A) NEGATIVE Final   C Diff toxin POSITIVE (A) NEGATIVE Final   C Diff interpretation   Final    Positive for toxigenic C. difficile, active toxin production present.    Comment: CRITICAL RESULT CALLED TO, READ BACK BY AND VERIFIED WITH: CHARLIE FLEETWOOD,RN 08/22/2015 1544 BY J RAZZAKSUAREZ,MT   Culture, bal-quantitative     Status: None   Collection Time: 08/25/15  3:15 PM  Result Value Ref Range Status   Specimen Description BRONCHIAL ALVEOLAR LAVAGE  Final   Special Requests NONE  Final   Gram Stain   Final    GOOD  SPECIMEN - 80-90% WBCS FEW WBC SEEN NO ORGANISMS SEEN    Culture HOLDING FOR POSSIBLE PATHOGEN  Final   Report Status 08/27/2015 FINAL  Final  Culture, blood (routine x 2)     Status: None   Collection Time: 09/02/15 12:00 PM  Result Value Ref Range Status   Specimen Description BLOOD RIGHT GROIN  Final   Special Requests   Final    BOTTLES DRAWN AEROBIC AND ANAEROBIC  6CC AERO 10CC ANAERO   Culture NO GROWTH 7 DAYS  Final   Report Status 09/09/2015 FINAL  Final  Culture, blood (routine x 2)     Status: None   Collection Time: 09/02/15 12:15 PM  Result Value Ref Range Status   Specimen Description BLOOD RIGHT GROIN  Final   Special Requests BOTTLES DRAWN AEROBIC AND ANAEROBIC  10 CC  Final   Culture NO GROWTH 7 DAYS  Final   Report Status 09/09/2015 FINAL  Final  Culture, blood (routine x 2)     Status: None   Collection Time: 09/02/15  1:47 PM  Result Value Ref Range Status   Specimen Description BLOOD RIGHT HAND  Final   Special Requests BOTTLES DRAWN AEROBIC AND ANAEROBIC 4CC  Final   Culture NO GROWTH 7 DAYS  Final   Report Status 09/09/2015 FINAL  Final  Culture, blood (routine x 2)     Status: None   Collection Time: 09/02/15  2:06 PM  Result Value Ref Range Status   Specimen Description BLOOD LEFT HAND  Final   Special Requests BOTTLES DRAWN AEROBIC AND ANAEROBIC 1CC  Final   Culture NO GROWTH 7 DAYS  Final   Report Status 09/09/2015 FINAL  Final  MRSA PCR Screening     Status: None   Collection Time: 09/02/15 10:27 PM  Result Value Ref Range Status   MRSA by PCR NEGATIVE NEGATIVE Final    Comment:        The GeneXpert MRSA Assay (FDA approved for NASAL specimens only), is one component of a comprehensive MRSA colonization surveillance program. It is not intended to diagnose MRSA infection nor to guide or monitor treatment for MRSA infections.   Culture, expectorated sputum-assessment     Status: None   Collection Time: 09/03/15 12:18 AM  Result Value Ref  Range Status   Specimen Description SPUTUM  Final   Special Requests Normal  Final   Sputum evaluation THIS  SPECIMEN IS ACCEPTABLE FOR SPUTUM CULTURE  Final   Report Status 09/03/2015 FINAL  Final  Culture, respiratory (NON-Expectorated)     Status: None   Collection Time: 09/03/15 12:18 AM  Result Value Ref Range Status   Specimen Description SPUTUM  Final   Special Requests Normal Reflexed from [id number]  Final   Gram Stain   Final    EXCELLENT SPECIMEN - 90-100% WBCS MANY WBC SEEN MANY GRAM NEGATIVE COCCOBACILLI    Culture MODERATE GROWTH CITROBACTER KOSERI  Final   Report Status 09/05/2015 FINAL  Final   Organism ID, Bacteria CITROBACTER KOSERI  Final      Susceptibility   Citrobacter koseri - MIC*    CEFAZOLIN <=4 SENSITIVE Sensitive     CEFTRIAXONE <=1 SENSITIVE Sensitive     CIPROFLOXACIN <=0.25 SENSITIVE Sensitive     GENTAMICIN <=1 SENSITIVE Sensitive     IMIPENEM <=0.25 SENSITIVE Sensitive     TRIMETH/SULFA <=20 SENSITIVE Sensitive     PIP/TAZO Value in next row Sensitive      SENSITIVE<=4    LEVOFLOXACIN Value in next row Sensitive      SENSITIVE<=0.12    * MODERATE GROWTH CITROBACTER KOSERI  Body fluid culture     Status: None   Collection Time: 09/04/15 10:30 AM  Result Value Ref Range Status   Specimen Description CYTO PLEU  Final   Special Requests NONE  Final   Gram Stain   Final    MODERATE WBC SEEN TOO NUMEROUS TO COUNT RED BLOOD CELLS NO ORGANISMS SEEN    Culture No growth aerobically or anaerobically.  Final   Report Status 09/08/2015 FINAL  Final  Body fluid culture     Status: None   Collection Time: 09/07/15 10:55 AM  Result Value Ref Range Status   Specimen Description CYTO PLEU  Final   Special Requests NONE  Final   Gram Stain FEW WBC SEEN NO ORGANISMS SEEN   Final   Culture No growth aerobically or anaerobically.  Final   Report Status 09/11/2015 FINAL  Final    Coagulation Studies: No results for input(s): LABPROT, INR in the last 72  hours.  Urinalysis: No results for input(s): COLORURINE, LABSPEC, PHURINE, GLUCOSEU, HGBUR, BILIRUBINUR, KETONESUR, PROTEINUR, UROBILINOGEN, NITRITE, LEUKOCYTESUR in the last 72 hours.  Invalid input(s): APPERANCEUR    Imaging: No results found.   Medications:     . antiseptic oral rinse  7 mL Mouth Rinse QID  . chlorhexidine gluconate  15 mL Mouth Rinse BID  . feeding supplement (ENSURE ENLIVE)  237 mL Oral TID WC  . heparin subcutaneous  5,000 Units Subcutaneous 3 times per day   acetaminophen **OR** acetaminophen, hydrALAZINE, Influenza vac split quadrivalent PF, ipratropium-albuterol, ondansetron (ZOFRAN) IV  Assessment/ Plan:  49 y.o. male With alcohol abuse, schizophrenia, seizure disorder, coronary disease/MI 2012 [facility] after being found unresponsive in his apartment. Urine toxicology screen is positive for benzodiazepines and cannabinoids. Stool positive for C. Difficile  1. Acute renal failure, likely secondary to ATN from concurrent illness, volume depletion, C. difficile, rhabdomyolysis and sepsis.   - Cr down to 2.9 today, UOP 1 liters over the past 24 hours.  Continue supportive care, no further indication for dialysis at this time.   2. Acute respiratory failure due to bacterial pneumonia/Left pleural effusion s/p thoracentesis 11/18. - chest tube 11/22 - remains on room air at the moment, no increased work of breathing.    3.  Hypernatremia. Sodium remains stable  at 141 off D5W.  4.  Hypokalemia. K improved to 3.6 after repletion yesterday.  5. Infective Colitis A09: c. Diff positive.  - treated with PO vancomycin previously.  6.  Acute rhabdomyolysis M62.82: Contributed to acute renal failure, resolved with conservative management.   LOS: 22 Analicia Skibinski 11/26/201611:32 AM

## 2015-09-12 NOTE — Progress Notes (Signed)
[facility] - [place] at [facility]   PATIENT NAME: Ross [name]    MR#:  [id number]  DATE OF BIRTH:  [birth year]    CHIEF COMPLAINT Feels better. Had a question for placement which I have deferred to SW  REVIEW OF SYSTEMS:   Review of Systems  Constitutional: Negative for fever, weight loss, malaise/fatigue and diaphoresis.  HENT: Negative for ear discharge, ear pain, hearing loss, nosebleeds, sore throat and tinnitus.   Eyes: Negative for blurred vision and pain.  Respiratory: Negative for cough, hemoptysis, shortness of breath and wheezing.   Cardiovascular: Negative for chest pain, palpitations, orthopnea and leg swelling.  Gastrointestinal: Negative for heartburn, nausea, vomiting, abdominal pain, diarrhea, constipation and blood in stool.  Genitourinary: Negative for dysuria, urgency and frequency.  Musculoskeletal: Negative for myalgias and back pain.  Skin: Negative for itching and rash.  Neurological: Negative for dizziness, tingling, tremors, focal weakness, seizures, weakness and headaches.  Psychiatric/Behavioral: Negative for depression. The patient is not nervous/anxious.      DRUG ALLERGIES:   Allergies  Allergen Reactions  . Antihistamines, Diphenhydramine-Type Anaphylaxis and Rash  . Benadryl [Diphenhydramine Hcl] Anaphylaxis and Rash  . Hydroxyzine Anaphylaxis and Rash    VITALS:  Blood pressure 155/87, pulse 75, temperature 99.9 F (37.7 C), temperature source Oral, resp. rate 18, height  (1.803 m), weight 99.927 kg (220 lb 4.8 oz), SpO2 99 %.  PHYSICAL EXAMINATION:  GENERAL:  49 y.o.-year-old patient remains weak, no acute distress HEENT: Pupils equal round and reactive, oral mucous membranes are moist Pulmonary: Short shallow breaths, good air movement, upper airway congestion, no wheezing  CARDIOVASCULAR: S1, S2 normal. No murmurs, rubs, or gallops. ABDOMEN: Soft, nontender, nondistended. Bowel sounds present. No  organomegaly or mass.  EXTREMITIES: No Pedaledema. No cyanosis, or clubbing. Peripheral pulses 2+ NEUROLOGIC: Cranial nerves grossly intact, moving all 4 extremities, sensations intact. SKIN: No obvious rash, lesion, or ulcer.  Psych: Alert.  LABORATORY PANEL:   CBC  Recent Labs Lab 09/12/15 0445  WBC 8.3  HGB 9.3*  HCT 27.9*  PLT 180   ------------------------------------------------------------------------------------------------------------------  Chemistries   Recent Labs Lab 09/08/15 0450  09/12/15 0445  NA 146*  < > 141  K 5.0  < > 3.6  CL 116*  < > 113*  CO2 23  < > 21*  GLUCOSE 129*  < > 86  BUN 59*  < > 37*  CREATININE 3.82*  < > 2.92*  CALCIUM 9.3  < > 8.6*  AST 40  --   --   ALT 34  --   --   ALKPHOS 446*  --   --   BILITOT 0.5  --   --   < > = values in this interval not displayed. ------------------------------------------------------------------------------------------------------------------   ASSESSMENT AND PLAN:   * Hypernatremia: resolved.  * Acute respiratory failure with hypoxia: (VDRF) - Pulmonary CCM following - Intubated 11/4 through 11/9 and then re-intubated 11/15 extubated 11/22 - pneumonia pleural effusion, thoracentesis 11/18 with removal of 1.8 L of fluid, exudative effusion, reaccumulation and chest tube placed today 11/21 - Sputum culture growing Citrobacter covering with Levaquin stop date 11/26 (today)  - pleural culture had no bacterial growth  * HAP with large parapneumonic effusion - chest tube placed 11/21, removed 11/23 Supplemental O2 to maintain SpO2 > 92% chest tube removed on 11/23 by PCCM  * Septic shock secondary to Hospital Acquired pneumonia and also possible line sepsis: See above.  - Left neck line removed,  right groin hemodialysis catheter removed - has PICC line in place - REMOVE ON D/C   * Rhabdomyolysis: Resolved - Possibly due to prolonged immobilization, hypotension, diarrhea, possible withdrawal  seizures prior to admission.   * Acute renal failure: Creat improving 3.13 -> 2.92. secondary to ATN from one depletion, rhabdomyolysis, C. Difficile. Good urine output. No need for hemodialysis at this time. 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 Albumin very low. Continue to monitor  * left leg edema;dvt study is negative.  * anxiety, mental disorder with PTSD: .schizophrenia;consult psych if need  Discontinue tele today and ambulate  -> Disposition. Care management has been discussing possible LTAC placement/CIR with the family.   CODE STATUS: Full   TOTAL TIME TAKING CARE OF THIS PATIENT: 35 minutes.    POSSIBLE D/C 1-2 DAYS, DEPENDING ON CLINICAL CONDITION. And placement  [facility], Freada Twersky M.D on 09/12/2015 at 7:10 AM  Between 7am to 6pm - Pager - [phone]  After 6pm go to [email or web address] - password [email or web address] [facility]  [facility]  Office  [phone]  CC: Primary care physician; No primary care provider on file.

## 2015-09-12 NOTE — Plan of Care (Signed)
Problem: Safety: Goal: Ability to remain free from injury will improve Outcome: Progressing Pt remains free from falls.  Bed in lowest position.  Call bell and phone within reach.  Bed alarm engaged.    Problem: Pain Managment: Goal: General experience of comfort will improve Outcome: Progressing Pt complains of no pain this shift.

## 2015-09-13 LAB — GLUCOSE, CAPILLARY: Glucose-Capillary: 94 mg/dL (ref 65–99)

## 2015-09-13 LAB — CBC
HCT: 27.6 % — ABNORMAL LOW (ref 40.0–52.0)
Hemoglobin: 9 g/dL — ABNORMAL LOW (ref 13.0–18.0)
MCH: 28.3 pg (ref 26.0–34.0)
MCHC: 32.7 g/dL (ref 32.0–36.0)
MCV: 86.5 fL (ref 80.0–100.0)
PLATELETS: 169 10*3/uL (ref 150–440)
RBC: 3.19 MIL/uL — ABNORMAL LOW (ref 4.40–5.90)
RDW: 13.9 % (ref 11.5–14.5)
WBC: 7.8 10*3/uL (ref 3.8–10.6)

## 2015-09-13 LAB — BASIC METABOLIC PANEL
Anion gap: 9 (ref 5–15)
BUN: 31 mg/dL — AB (ref 6–20)
CALCIUM: 8.4 mg/dL — AB (ref 8.9–10.3)
CO2: 20 mmol/L — ABNORMAL LOW (ref 22–32)
CREATININE: 2.66 mg/dL — AB (ref 0.61–1.24)
Chloride: 111 mmol/L (ref 101–111)
GFR calc Af Amer: 31 mL/min — ABNORMAL LOW (ref >60)
GFR, EST NON AFRICAN AMERICAN: 27 mL/min — AB (ref >60)
GLUCOSE: 81 mg/dL (ref 65–99)
Potassium: 3.4 mmol/L — ABNORMAL LOW (ref 3.5–5.1)
Sodium: 140 mmol/L (ref 135–145)

## 2015-09-13 MED ORDER — POTASSIUM CHLORIDE CRYS ER 20 MEQ PO TBCR
40.0000 meq | EXTENDED_RELEASE_TABLET | Freq: Once | ORAL | Status: AC
  Administered 2015-09-13: 40 meq via ORAL
  Filled 2015-09-13: qty 2

## 2015-09-13 NOTE — Progress Notes (Signed)
Physical Therapy Treatment Patient Details Name: Ross [name] MRN: [id number] DOB: November 15, 1965 Today's Date: 09/13/2015    History of Present Illness presented to ER after being found unresponsive and noted to have seizure-like activity, intubated in ED due to respiratory failure with hypoxia; admitted with sepsis (likely related to c-diff) with multi-organ failure.  Noted with mild elevation in troponin, likely secondary to rhabdomyolysis.  Hospital course additionally significant for placement of R temp femoral dialysis catheter for HD (now removed); self-extubated on 11/9 and transferred to gen medical floor.  Experienced progressive respiratory distress requiring return to CCU and reintubation 11/15-11/22.  Status post thoracentesis 11/18 and 11/21 and placement of L CT 11/21 (removed 11/23).  PT now re-consulted for continued services as medical status improving.    PT Comments    Pt is able to get to standing today and actually able to maintain balance with walker and only light min assist.  He is very eager to participate with exercises and though he has some bilateral weakness and coordination issues he is eager to work with PT now and at rehab.  Follow Up Recommendations  CIR     Equipment Recommendations  None recommended by PT    Recommendations for Other Services       Precautions / Restrictions Precautions Precautions: Fall Precaution Comments: Enteric isolation Restrictions Weight Bearing Restrictions: No    Mobility  Bed Mobility Overal bed mobility: Needs Assistance Bed Mobility: Sidelying to Sit;Supine to Sit   Sidelying to sit: Mod assist Supine to sit: Mod assist     General bed mobility comments: Pt does well assisting to EOB and is able to maitnain sitting balance w/o assist  Transfers Overall transfer level: Needs assistance Equipment used: Rolling walker (2 wheeled) Transfers: Sit to/from Stand Sit to Stand: Mod assist;Max assist          General transfer comment: Pt actually able to stand at EOB for ~90 seconds with walker and min/mod assist.  Pt fatigues out quickly with the effort.   Ambulation/Gait             General Gait Details: unable to ambulate, though he does take 2 side shuffling steps at EOB with max assist   Stairs            Wheelchair Mobility    Modified Rankin (Stroke Patients Only)       Balance                                    Cognition Arousal/Alertness: Awake/alert Behavior During Therapy: WFL for tasks assessed/performed Overall Cognitive Status: Impaired/Different from baseline                      Exercises General Exercises - Lower Extremity Ankle Circles/Pumps: AAROM;AROM;10 reps Quad Sets: Strengthening;10 reps Gluteal Sets: Strengthening;10 reps Heel Slides: Strengthening;AROM;10 reps Hip ABduction/ADduction: Strengthening;AROM;10 reps Straight Leg Raises: AROM;AAROM;10 reps    General Comments        Pertinent Vitals/Pain Pain Assessment: No/denies pain    Home Living                      Prior Function            PT Goals (current goals can now be found in the care plan section) Progress towards PT goals: Progressing toward goals    Frequency  7X/week  PT Plan Current plan remains appropriate    Co-evaluation             End of Session Equipment Utilized During Treatment: Gait belt Activity Tolerance: Patient limited by fatigue Patient left: with bed alarm set;with call bell/phone within reach     Time: [id number] PT Time Calculation (min) (ACUTE ONLY): 25 min  Charges:  $Therapeutic Exercise: 8-22 mins $Therapeutic Activity: 8-22 mins                    G Codes:     Loran SentersGalen Jadriel Saxer, PT, DPT [phone]  Malachi ProGalen R Yahayra Geis 09/13/2015, 4:32 PM

## 2015-09-13 NOTE — Plan of Care (Signed)
Problem: Bowel/Gastric: Goal: Will not experience complications related to bowel motility Outcome: Not Progressing Plan of care, progress to goals. 1. Pt resting quietly most of night. Pleasantly confused.  Cooperative with care. 2. Has frequent liquid yellow incontinent bowel movements secondary to c-diff.  Pt developing moisture related contact dermatitis to groin folds.  Washed with foam cleanser and wipes.  Applied 24 hour barrier cream.  Pt changed frequently and cleaned after each bowel movement. 3. PICC dressing changed.  Insertion site is cdi. Henriette CombsSarah Travonte Byard RN

## 2015-09-13 NOTE — Progress Notes (Signed)
[facility] [place] [facility]   PATIENT NAME: Ross [place] [name]    MR#:  [id number]  DATE OF BIRTH:  1966-03-01    CHIEF COMPLAINT Feels better.   REVIEW OF SYSTEMS:   Review of Systems  Constitutional: Negative for fever, weight loss, malaise/fatigue and diaphoresis.  HENT: Negative for ear discharge, ear pain, hearing loss, nosebleeds, sore throat and tinnitus.   Eyes: Negative for blurred vision and pain.  Respiratory: Negative for cough, hemoptysis, shortness of breath and wheezing.   Cardiovascular: Negative for chest pain, palpitations, orthopnea and leg swelling.  Gastrointestinal: Negative for heartburn, nausea, vomiting, abdominal pain, diarrhea, constipation and blood in stool.  Genitourinary: Negative for dysuria, urgency and frequency.  Musculoskeletal: Negative for myalgias and back pain.  Skin: Negative for itching and rash.  Neurological: Negative for dizziness, tingling, tremors, focal weakness, seizures, weakness and headaches.  Psychiatric/Behavioral: Negative for depression. The patient is not nervous/anxious.     DRUG ALLERGIES:   Allergies  Allergen Reactions  . Antihistamines, Diphenhydramine-Type Anaphylaxis and Rash  . Benadryl [Diphenhydramine Hcl] Anaphylaxis and Rash  . Hydroxyzine Anaphylaxis and Rash    VITALS:  Blood pressure 136/74, pulse 81, temperature 98.2 F (36.8 C), temperature source Oral, resp. rate 18, height 5\' 11"  (1.803 m), weight 97.977 kg (216 lb), SpO2 100 %. PHYSICAL EXAMINATION:  GENERAL:  49 y.o.-year-old patient remains weak, no acute distress HEENT: Pupils equal round and reactive, oral mucous membranes are moist Pulmonary: Short shallow breaths, good air movement, upper airway congestion, no wheezing  CARDIOVASCULAR: S1, S2 normal. No murmurs, rubs, or gallops. ABDOMEN: Soft, nontender, nondistended. Bowel sounds present. No organomegaly or mass.  EXTREMITIES: No Pedaledema. No cyanosis, or  clubbing. Peripheral pulses 2+ NEUROLOGIC: Cranial nerves grossly intact, moving all 4 extremities, sensations intact. SKIN: No obvious rash, lesion, or ulcer.  Psych: Alert.  LABORATORY PANEL:   CBC  Recent Labs Lab 09/13/15 0725  WBC 7.8  HGB 9.0*  HCT 27.6*  PLT 169   ------------------------------------------------------------------------------------------------------------------  Chemistries   Recent Labs Lab 09/08/15 0450  09/13/15 0725  NA 146*  < > 140  K 5.0  < > 3.4*  CL 116*  < > 111  CO2 23  < > 20*  GLUCOSE 129*  < > 81  BUN 59*  < > 31*  CREATININE 3.82*  < > 2.66*  CALCIUM 9.3  < > 8.4*  AST 40  --   --   ALT 34  --   --   ALKPHOS 446*  --   --   BILITOT 0.5  --   --   < > = values in this interval not displayed. ------------------------------------------------------------------------------------------------------------------   ASSESSMENT AND PLAN:   * Hypernatremia: resolved.  * Acute respiratory failure with hypoxia: (VDRF) - Pulmonary CCM following - Intubated 11/4 through 11/9 and then re-intubated 11/15 extubated 11/22 - pneumonia pleural effusion, thoracentesis 11/18 with removal of 1.8 L of fluid, exudative effusion, reaccumulation and chest tube placed today 11/21 - Sputum culture growing Citrobacter covering with Levaquin stop date 11/26 (today)  - pleural culture had no bacterial growth  * HAP with large parapneumonic effusion - chest tube placed 11/21, removed 11/23 Supplemental O2 to maintain SpO2 > 92% chest tube removed on 11/23 by PCCM  * Septic shock secondary to Hospital Acquired pneumonia and also possible line sepsis: See above.  - Left neck line removed, right groin hemodialysis catheter removed - has PICC line in place - REMOVE ON  D/C   * Rhabdomyolysis: Resolved - Possibly due to prolonged immobilization, hypotension, diarrhea, possible withdrawal seizures prior to admission.   * Acute renal failure: Creat improving  3.13 -> 2.92->2.66. secondary to ATN from one depletion, rhabdomyolysis, C. Difficile. Good urine output. No need for hemodialysis at this time. 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 Albumin very low. Continue to monitor  * left leg edema;dvt study is negative.  * anxiety, mental disorder with PTSD: .schizophrenia;consult psych if need  Discontinue tele today and ambulate  -> Disposition. Care management has been discussing possible LTAC placement/CIR with the family.  We will discuss with social worker today  CODE STATUS: Full   TOTAL TIME TAKING CARE OF THIS PATIENT: 35 minutes.    POSSIBLE D/C 1-2 DAYS, DEPENDING ON CLINICAL CONDITION. And placement  [facility], [name] M.D on 09/13/2015 at 8:39 AM  Between 7am to 6pm - Pager - [phone]  After 6pm go to [email or web address] - password [email or web address] [facility]  [place] [facility]  Office  [phone]  CC: Primary care physician; No primary care provider on file.

## 2015-09-13 NOTE — Plan of Care (Signed)
Problem: Bowel/Gastric: Goal: Will not experience complications related to bowel motility Outcome: Progressing Plan of care progress: -pt continues to have incont episodes of bowel and bladder, loose stools -pt mobility encouraged -free from falls, call bell in reach, encouraged to call out for assist -remains confused at times, reoriented as needed -no complaints of pain no distress or discomfort noted -appetite fair

## 2015-09-13 NOTE — Progress Notes (Signed)
Subjective:  Patient appears to be doing well this a.m. Potassium currently 3.4. Creatinine down to 2.66. Urine output noted as being 1000 cc over the past 24 hours.  Objective:  Vital signs in last 24 hours:  Temp:  [98.2 F (36.8 C)-98.6 F (37 C)] 98.2 F (36.8 C) (11/27 0641) Pulse Rate:  [69-89] 81 (11/27 0641) Resp:  [18-20] 18 (11/27 0641) BP: (129-138)/(74-90) 136/74 mmHg (11/27 0641) SpO2:  [95 %-100 %] 100 % (11/27 0641) Weight:  [97.977 kg (216 lb)] 97.977 kg (216 lb) (11/27 0340)  Weight change: -1.95 kg (-4 lb 4.8 oz) Filed Weights   09/11/15 0500 09/12/15 0445 09/13/15 0340  Weight: 105.597 kg (232 lb 12.8 oz) 99.927 kg (220 lb 4.8 oz) 97.977 kg (216 lb)    Intake/Output:    Intake/Output Summary (Last 24 hours) at 09/13/15 1035 Last data filed at 09/13/15 2130  Gross per 24 hour  Intake      0 ml  Output   1000 ml  Net  -1000 ml     Physical Exam: General: No acute distress  HEENT Combine/AT, EOMI OM moist  Neck supple  Pulm/lungs bilateral rhonchi normal effort  CVS/Heart S1S2 no rubs  Abdomen:  soft, NTND BS present  Extremities: + b/l LE edema  Neurologic: Awake, alert, following commands, conversant  Skin: no acute rashes  GU: Foley in place        Basic Metabolic Panel:   Recent Labs Lab 09/09/15 0500 09/10/15 0430 09/11/15 0530 09/12/15 0445 09/13/15 0725  NA 147* 144 141 141 140  K 4.0 3.7 3.3* 3.6 3.4*  CL 117* 115* 112* 113* 111  CO2 20* 20* 21* 21* 20*  GLUCOSE 113* 105* 97 86 81  BUN 55* 47* 40* 37* 31*  CREATININE 3.67* 3.51* 3.13* 2.92* 2.66*  CALCIUM 9.2 9.1 8.6* 8.6* 8.4*     CBC:  Recent Labs Lab 09/08/15 0450 09/10/15 0430 09/11/15 0530 09/12/15 0445 09/13/15 0725  WBC 7.8 10.5 9.9 8.3 7.8  HGB 9.4* 9.0* 9.0* 9.3* 9.0*  HCT 29.5* 27.5* 27.0* 27.9* 27.6*  MCV 89.7 86.9 87.2 86.4 86.5  PLT 259 228 179 180 169      Microbiology:  Recent Results (from the past 720 hour(s))  Blood Culture (routine x 2)      Status: None   Collection Time: 08/21/15 12:47 PM  Result Value Ref Range Status   Specimen Description BLOOD UNKNOWN  Final   Special Requests BOTTLES DRAWN AEROBIC AND ANAEROBIC  1CC  Final   Culture NO GROWTH 10 DAYS  Final   Report Status 08/31/2015 FINAL  Final  Blood Culture (routine x 2)     Status: None   Collection Time: 08/21/15 12:47 PM  Result Value Ref Range Status   Specimen Description BLOOD LEFT AC  Final   Special Requests BOTTLES DRAWN AEROBIC AND ANAEROBIC  6CC  Final   Culture NO GROWTH 10 DAYS  Final   Report Status 08/31/2015 FINAL  Final  Urine culture     Status: None   Collection Time: 08/21/15 12:47 PM  Result Value Ref Range Status   Specimen Description URINE, CATHETERIZED  Final   Special Requests Normal  Final   Culture NO GROWTH 2 DAYS  Final   Report Status 08/23/2015 FINAL  Final  MRSA PCR Screening     Status: None   Collection Time: 08/21/15 11:47 PM  Result Value Ref Range Status   MRSA by PCR NEGATIVE NEGATIVE Final  Comment:        The GeneXpert MRSA Assay (FDA approved for NASAL specimens only), is one component of a comprehensive MRSA colonization surveillance program. It is not intended to diagnose MRSA infection nor to guide or monitor treatment for MRSA infections.   C difficile quick scan w PCR reflex     Status: Abnormal   Collection Time: 08/22/15  2:31 PM  Result Value Ref Range Status   C Diff antigen POSITIVE (A) NEGATIVE Final   C Diff toxin POSITIVE (A) NEGATIVE Final   C Diff interpretation   Final    Positive for toxigenic C. difficile, active toxin production present.    Comment: CRITICAL RESULT CALLED TO, READ BACK BY AND VERIFIED WITH: CHARLIE FLEETWOOD,RN 08/22/2015 1544 BY J RAZZAKSUAREZ,MT   Culture, bal-quantitative     Status: None   Collection Time: 08/25/15  3:15 PM  Result Value Ref Range Status   Specimen Description BRONCHIAL ALVEOLAR LAVAGE  Final   Special Requests NONE  Final   Gram Stain    Final    GOOD SPECIMEN - 80-90% WBCS FEW WBC SEEN NO ORGANISMS SEEN    Culture HOLDING FOR POSSIBLE PATHOGEN  Final   Report Status 08/27/2015 FINAL  Final  Culture, blood (routine x 2)     Status: None   Collection Time: 09/02/15 12:00 PM  Result Value Ref Range Status   Specimen Description BLOOD RIGHT GROIN  Final   Special Requests   Final    BOTTLES DRAWN AEROBIC AND ANAEROBIC  6CC AERO 10CC ANAERO   Culture NO GROWTH 7 DAYS  Final   Report Status 09/09/2015 FINAL  Final  Culture, blood (routine x 2)     Status: None   Collection Time: 09/02/15 12:15 PM  Result Value Ref Range Status   Specimen Description BLOOD RIGHT GROIN  Final   Special Requests BOTTLES DRAWN AEROBIC AND ANAEROBIC  10 CC  Final   Culture NO GROWTH 7 DAYS  Final   Report Status 09/09/2015 FINAL  Final  Culture, blood (routine x 2)     Status: None   Collection Time: 09/02/15  1:47 PM  Result Value Ref Range Status   Specimen Description BLOOD RIGHT HAND  Final   Special Requests BOTTLES DRAWN AEROBIC AND ANAEROBIC 4CC  Final   Culture NO GROWTH 7 DAYS  Final   Report Status 09/09/2015 FINAL  Final  Culture, blood (routine x 2)     Status: None   Collection Time: 09/02/15  2:06 PM  Result Value Ref Range Status   Specimen Description BLOOD LEFT HAND  Final   Special Requests BOTTLES DRAWN AEROBIC AND ANAEROBIC 1CC  Final   Culture NO GROWTH 7 DAYS  Final   Report Status 09/09/2015 FINAL  Final  MRSA PCR Screening     Status: None   Collection Time: 09/02/15 10:27 PM  Result Value Ref Range Status   MRSA by PCR NEGATIVE NEGATIVE Final    Comment:        The GeneXpert MRSA Assay (FDA approved for NASAL specimens only), is one component of a comprehensive MRSA colonization surveillance program. It is not intended to diagnose MRSA infection nor to guide or monitor treatment for MRSA infections.   Culture, expectorated sputum-assessment     Status: None   Collection Time: 09/03/15 12:18 AM   Result Value Ref Range Status   Specimen Description SPUTUM  Final   Special Requests Normal  Final   Sputum evaluation THIS  SPECIMEN IS ACCEPTABLE FOR SPUTUM CULTURE  Final   Report Status 09/03/2015 FINAL  Final  Culture, respiratory (NON-Expectorated)     Status: None   Collection Time: 09/03/15 12:18 AM  Result Value Ref Range Status   Specimen Description SPUTUM  Final   Special Requests Normal Reflexed from [id number]  Final   Gram Stain   Final    EXCELLENT SPECIMEN - 90-100% WBCS MANY WBC SEEN MANY GRAM NEGATIVE COCCOBACILLI    Culture MODERATE GROWTH CITROBACTER KOSERI  Final   Report Status 09/05/2015 FINAL  Final   Organism ID, Bacteria CITROBACTER KOSERI  Final      Susceptibility   Citrobacter koseri - MIC*    CEFAZOLIN <=4 SENSITIVE Sensitive     CEFTRIAXONE <=1 SENSITIVE Sensitive     CIPROFLOXACIN <=0.25 SENSITIVE Sensitive     GENTAMICIN <=1 SENSITIVE Sensitive     IMIPENEM <=0.25 SENSITIVE Sensitive     TRIMETH/SULFA <=20 SENSITIVE Sensitive     PIP/TAZO Value in next row Sensitive      SENSITIVE<=4    LEVOFLOXACIN Value in next row Sensitive      SENSITIVE<=0.12    * MODERATE GROWTH CITROBACTER KOSERI  Body fluid culture     Status: None   Collection Time: 09/04/15 10:30 AM  Result Value Ref Range Status   Specimen Description CYTO PLEU  Final   Special Requests NONE  Final   Gram Stain   Final    MODERATE WBC SEEN TOO NUMEROUS TO COUNT RED BLOOD CELLS NO ORGANISMS SEEN    Culture No growth aerobically or anaerobically.  Final   Report Status 09/08/2015 FINAL  Final  Body fluid culture     Status: None   Collection Time: 09/07/15 10:55 AM  Result Value Ref Range Status   Specimen Description CYTO PLEU  Final   Special Requests NONE  Final   Gram Stain FEW WBC SEEN NO ORGANISMS SEEN   Final   Culture No growth aerobically or anaerobically.  Final   Report Status 09/11/2015 FINAL  Final    Coagulation Studies: No results for input(s): LABPROT,  INR in the last 72 hours.  Urinalysis: No results for input(s): COLORURINE, LABSPEC, PHURINE, GLUCOSEU, HGBUR, BILIRUBINUR, KETONESUR, PROTEINUR, UROBILINOGEN, NITRITE, LEUKOCYTESUR in the last 72 hours.  Invalid input(s): APPERANCEUR    Imaging: No results found.   Medications:     . antiseptic oral rinse  7 mL Mouth Rinse QID  . chlorhexidine gluconate  15 mL Mouth Rinse BID  . feeding supplement (ENSURE ENLIVE)  237 mL Oral TID WC  . heparin subcutaneous  5,000 Units Subcutaneous 3 times per day   acetaminophen **OR** acetaminophen, hydrALAZINE, Influenza vac split quadrivalent PF, ipratropium-albuterol, ondansetron (ZOFRAN) IV  Assessment/ Plan:  49 y.o. male With alcohol abuse, schizophrenia, seizure disorder, coronary disease/MI 2012 [facility] after being found unresponsive in his apartment. Urine toxicology screen is positive for benzodiazepines and cannabinoids. Stool positive for C. Difficile  1. Acute renal failure, likely secondary to ATN from concurrent illness, volume depletion, C. difficile, rhabdomyolysis and sepsis.   - overall the patient appears to be improving.  Creatinine down to 2.6 with urine output of 1000 mL over the past 24 hours.   Supportive care at this time.   2. Acute respiratory failure due to bacterial pneumonia/Left pleural effusion s/p thoracentesis 11/18. - chest tube 11/22 - doing wellpost extubation.    3.  Hypernatremia. Odium currently 140.  Doing well off  of D5W..  4.  Hypokalemia. K slightly low today at 3.4.  Continue to monitor..  5. Infective Colitis A09: c. Diff positive.  - treated with PO vancomycin previously.  6.  Acute rhabdomyolysis M62.82: Contributed to acute renal failure, resolved with conservative management.   LOS: 23 Jaylynne Birkhead 11/27/201610:35 AM

## 2015-09-14 ENCOUNTER — Telehealth: Payer: Self-pay | Admitting: *Deleted

## 2015-09-14 DIAGNOSIS — F200 Paranoid schizophrenia: Secondary | ICD-10-CM

## 2015-09-14 DIAGNOSIS — A419 Sepsis, unspecified organism: Secondary | ICD-10-CM | POA: Diagnosis not present

## 2015-09-14 DIAGNOSIS — F1010 Alcohol abuse, uncomplicated: Secondary | ICD-10-CM | POA: Insufficient documentation

## 2015-09-14 LAB — BASIC METABOLIC PANEL
Anion gap: 6 (ref 5–15)
BUN: 25 mg/dL — AB (ref 6–20)
CHLORIDE: 111 mmol/L (ref 101–111)
CO2: 20 mmol/L — AB (ref 22–32)
CREATININE: 2.48 mg/dL — AB (ref 0.61–1.24)
Calcium: 7.7 mg/dL — ABNORMAL LOW (ref 8.9–10.3)
GFR calc Af Amer: 34 mL/min — ABNORMAL LOW (ref >60)
GFR calc non Af Amer: 29 mL/min — ABNORMAL LOW (ref >60)
GLUCOSE: 109 mg/dL — AB (ref 65–99)
Potassium: 3.7 mmol/L (ref 3.5–5.1)
Sodium: 137 mmol/L (ref 135–145)

## 2015-09-14 LAB — MAGNESIUM: Magnesium: 1.4 mg/dL — ABNORMAL LOW (ref 1.7–2.4)

## 2015-09-14 LAB — CBC
HEMATOCRIT: 28.5 % — AB (ref 40.0–52.0)
Hemoglobin: 9.3 g/dL — ABNORMAL LOW (ref 13.0–18.0)
MCH: 28.4 pg (ref 26.0–34.0)
MCHC: 32.6 g/dL (ref 32.0–36.0)
MCV: 87 fL (ref 80.0–100.0)
Platelets: 178 10*3/uL (ref 150–440)
RBC: 3.28 MIL/uL — ABNORMAL LOW (ref 4.40–5.90)
RDW: 13.7 % (ref 11.5–14.5)
WBC: 7 10*3/uL (ref 3.8–10.6)

## 2015-09-14 LAB — GLUCOSE, CAPILLARY: Glucose-Capillary: 103 mg/dL — ABNORMAL HIGH (ref 65–99)

## 2015-09-14 MED ORDER — MAGNESIUM SULFATE 4 GM/100ML IV SOLN
4.0000 g | Freq: Once | INTRAVENOUS | Status: AC
  Administered 2015-09-14: 4 g via INTRAVENOUS
  Filled 2015-09-14: qty 100

## 2015-09-14 MED ORDER — MAGNESIUM SULFATE 2 GM/50ML IV SOLN: 2.0000 g | Freq: Once | INTRAVENOUS | Status: DC

## 2015-09-14 NOTE — Discharge Summary (Addendum)
[facility] [place] [facility]   PATIENT NAME: Ross [name]    MR#:  [id number]  DATE OF BIRTH:  Oct 08, 1966  DATE OF ADMISSION:  08/21/2015 ADMITTING PHYSICIAN: Gale Journeyatherine P Walsh, MD  DATE OF DISCHARGE: 09/14/2015  PRIMARY CARE PHYSICIAN: Barbette ReichmannHande, Vishwanath MD    ADMISSION DIAGNOSIS:  Elevated LFTs [R79.89] History of alcohol abuse [F10.10] NSTEMI (non-ST elevated myocardial infarction) (HCC) [I21.4] Acute respiratory failure with hypoxia (HCC) [J96.01] History of seizure [Z86.69] Sepsis, due to unspecified organism (HCC) [A41.9] Acute renal failure, unspecified acute renal failure type (HCC) [N17.9] Altered mental status, unspecified altered mental status type [R41.82]  DISCHARGE DIAGNOSIS:  Active Problems:   Sepsis (HCC)   Acute respiratory failure (HCC)   Enteritis due to Clostridium difficile   Rhabdomyolysis   Acute renal failure (HCC)   Acute respiratory failure with hypoxia (HCC)   Altered mental status   Pleural effusion  septic shock Hypernatremia  SECONDARY DIAGNOSIS:   Past Medical History  Diagnosis Date  . Sleep apnea     had surgery to correct it  . Mental disorder     PTSD  . Seizures (HCC)     currently dx with seizures  . Cardiac arrest (HCC) 08/23/2011  . Pneumonia 09/04/2011  . Pancreatitis   . SOB (shortness of breath) 09/11/2011  . Hypertension   . Anxiety   . Myocardial infarction (HCC)   . Schizophrenia (HCC)   . Rhabdomyolysis 08/23/2015  . Acute renal failure (HCC) 08/23/2015    HOSPITAL COURSE:  49 y.o. male with a known history of alcohol abuse, schizophrenia, seizure disorder, coronary artery disease status post MI in 2012 resents by EMS after being found unresponsive in his apartment. EMS reported seizure-like activity on arrival and during transport. On arrival to the emergency room he was able to state his name and follow simple Commands. He was very lethargic and responding minimally. He was  hypoxic with oxygen saturations of 88% on 6 L. He was intubated in the emergency room by Dr. York CeriseForbach due to respiratory failure with hypoxia. On presentation his clothes were soaked with urine and loose feces. He is being admitted for sepsis with high fever of 103, lactate of 2.3, multiorgan failure. Please see Dr Claris CheWalsh's dictated H & P for further details.   MAJOR EVENTS/TEST RESULTS 11/4 intubated 11/5 failed vent wean due to encephalopathy and acidosis 11/6 RIJ CVL, R Fem Vein VasCath 11/8 bronchoscopy with BAL 11/8 thoracentesis, 1800 cc removed.  11/9 failed self extubation 11/15 transferred back to ICU 11/15 intubated 11/18 Thoracentesis - 1800 cc serosanguinous fluid removed. Exudative chemistries. Cultures negative 11/21 thoracentesis - loculations on US. 35 cc serosanguinous fluid removed. 11/21 CT chest: IMPRESSION: Large left pleural effusion is associated with compressive atelectasis of much of the left lung. Endobronchial lesion in the left lower lobe bronchus is not excluded. 11/21 L chest tube placed 11/22 Extubated. tPA to L chest tube 11/23 Repeat CT chest: Decrease in volume of loculated left-sided pleural effusion with chest tube in place. 11/23 chest tube removed  INDWELLING DEVICES:: ETT 11/04 >> 11/09, 11/09 >> out, 11/15 >> 11/22 R IJ CVL 11/06 >> 11/16 R femoral HD cath 11/06 >> 11/16 LUE PICC 11/16 >>11/28  * Hypernatremia: resolved.  * Acute respiratory failure with hypoxia: (VDRF) - Sputum culture growing Citrobacter covering with Levaquin stop date 11/26  * HAP with large parapneumonic effusion - chest tube placed 11/21, removed 11/23  * Septic shock secondary to Hospital Acquired pneumonia  *  Rhabdomyolysis: Resolved - Possibly due to prolonged immobilization, hypotension, diarrhea, possible withdrawal seizures prior to admission.   * Acute renal failure: Creat improving 3.13 -> 2.92->2.66->2.48.   She is feeling much better and is agreeable  with the discharge plan is being discharged to long-term acute care facility in stable condition. DISCHARGE CONDITIONS:   STABLE  CONSULTS OBTAINED:  Treatment Team:  Mosetta Pigeon, MD Dalia Heading, MD Clydie Braun, MD Audery Amel, MD  DRUG ALLERGIES:   Allergies  Allergen Reactions  . Antihistamines, Diphenhydramine-Type Anaphylaxis and Rash  . Benadryl [Diphenhydramine Hcl] Anaphylaxis and Rash  . Hydroxyzine Anaphylaxis and Rash    DISCHARGE MEDICATIONS:   Current Discharge Medication List    CONTINUE these medications which have NOT CHANGED   Details  acetaminophen (TYLENOL) 325 MG tablet Take 650 mg by mouth every 4 (four) hours as needed for mild pain or fever.     fluticasone (FLONASE) 50 MCG/ACT nasal spray Place 1-2 sprays into both nostrils at bedtime.     folic acid (FOLVITE) 1 MG tablet Take 1 mg by mouth daily.    Multiple Vitamin (MULTIVITAMIN WITH MINERALS) TABS tablet Take 1 tablet by mouth daily.    naltrexone (DEPADE) 50 MG tablet Take 50 mg by mouth daily.    thiamine 100 MG tablet Take 100 mg by mouth daily.    triamcinolone cream (KENALOG) 0.1 % Apply 1 application topically 2 (two) times daily as needed (for rash).    Melatonin 3 MG TABS Take 3 mg by mouth at bedtime.       STOP taking these medications     metFORMIN (GLUCOPHAGE) 500 MG tablet      metroNIDAZOLE (FLAGYL) 500 MG tablet        DISCHARGE INSTRUCTIONS:    DIET:  Regular diet  DISCHARGE CONDITION:  Good  ACTIVITY:  Activity as tolerated  OXYGEN:  Home Oxygen: No.   Oxygen Delivery: room air  DISCHARGE LOCATION:  [facility]   If you experience worsening of your admission symptoms, develop shortness of breath, life threatening emergency, suicidal or homicidal thoughts you must seek medical attention immediately by calling 911 or calling your MD immediately  if symptoms less severe.  You Must read complete instructions/literature along with all the possible  adverse reactions/side effects for all the Medicines you take and that have been prescribed to you. Take any new Medicines after you have completely understood and accpet all the possible adverse reactions/side effects.   Please note  You were cared for by a hospitalist during your hospital stay. If you have any questions about your discharge medications or the care you received while you were in the hospital after you are discharged, you can call the unit and asked to speak with the hospitalist on call if the hospitalist that took care of you is not available. Once you are discharged, your primary care physician will handle any further medical issues. Please note that NO REFILLS for any discharge medications will be authorized once you are discharged, as it is imperative that you return to your primary care physician (or establish a relationship with a primary care physician if you do not have one) for your aftercare needs so that they can reassess your need for medications and monitor your lab values.    On the day of Discharge:  VITAL SIGNS:  Blood pressure 122/76, pulse 82, temperature 99.6 F (37.6 C), temperature source Oral, resp. rate 19, height  (1.803 m), weight 94.348 kg (  208 lb), SpO2 100 %. PHYSICAL EXAMINATION:  GENERAL:  49 y.o.-year-old patient lying in the bed with no acute distress.  EYES: Pupils equal, round, reactive to light and accommodation. No scleral icterus. Extraocular muscles intact.  HEENT: Head atraumatic, normocephalic. Oropharynx and nasopharynx clear.  NECK:  Supple, no jugular venous distention. No thyroid enlargement, no tenderness.  LUNGS: Normal breath sounds bilaterally, no wheezing, rales,rhonchi or crepitation. No use of accessory muscles of respiration.  CARDIOVASCULAR: S1, S2 normal. No murmurs, rubs, or gallops.  ABDOMEN: Soft, non-tender, non-distended. Bowel sounds present. No organomegaly or mass.  EXTREMITIES: No pedal edema, cyanosis, or  clubbing.  NEUROLOGIC: Cranial nerves II through XII are intact. Muscle strength 5/5 in all extremities. Sensation intact. Gait not checked.  PSYCHIATRIC: The patient is alert and oriented x 3.  SKIN: No obvious rash, lesion, or ulcer.  DATA REVIEW:   CBC  Recent Labs Lab 09/14/15 0509  WBC 7.0  HGB 9.3*  HCT 28.5*  PLT 178    Chemistries   Recent Labs Lab 09/08/15 0450  09/14/15 0509  NA 146*  < > 137  K 5.0  < > 3.7  CL 116*  < > 111  CO2 23  < > 20*  GLUCOSE 129*  < > 109*  BUN 59*  < > 25*  CREATININE 3.82*  < > 2.48*  CALCIUM 9.3  < > 7.7*  MG  --   --  1.4*  AST 40  --   --   ALT 34  --   --   ALKPHOS 446*  --   --   BILITOT 0.5  --   --   < > = values in this interval not displayed.   Management plans discussed with the patient, family and they are in agreement.  CODE STATUS: Full Code  TOTAL TIME TAKING CARE OF THIS PATIENT: 55 minutes.    [facility], Sherl Yzaguirre M.D on 09/14/2015 at 10:41 AM  Between 7am to 6pm - Pager - [phone]  After 6pm go to [email or web address] - password [email or web address] [facility]  [place] [facility]  Office  [phone]  CC: Primary care physician; Barbette Reichmann MD

## 2015-09-14 NOTE — Progress Notes (Signed)
Speech Language Pathology Treatment: Dysphagia  Patient Details Name: Ross [name] MRN: [id number] DOB: 22-Jul-1966 Today's Date: 09/14/2015 Time: [id number] SLP Time Calculation (min) (ACUTE ONLY): 15 min  Assessment / Plan / Recommendation Clinical Impression  ST spoke with NSG, reviewed chart and spoke with Pt as well as observed PO intake of thin liquids (4 ounces of orange juice). Pt reports no concerns with swallowing and denies coughing, choking, strangling while eating. ST observed no immediate or overt s/s of aspiration on trials of thin liquid. Pt had consumed entire breakfast tray without any reported trouble with swallow. Pt reports he is at his baseline and have no further speech, language or swallowing concerns. ST services not indicated at this time, but ST is available should future services be necessary while Pt is admitted. NSG updated.    HPI HPI: Pt is a 49 y.o. male with a known c/o "heavy" alcohol abuse, schizophrenia, seizure disorder, coronary artery disease status post MI in 2012 resents by EMS after being found unresponsive in his apartment. He has had no contact with anyone for at least 24 hours. EMS reported seizure-like activity on arrival and during transport. On arrival to the emergency room he was able to state his name and follow simple Commands. He was very lethargic and responding minimally. He was hypoxic with oxygen saturations of 88% on 6 L. He was intubated in the emergency room by Dr. York CeriseForbach due to respiratory failure with hypoxia. On presentation his clothes were soaked with urine and loose feces. He is being admitted for sepsis with high fever of 103, lactate of 2.3, multiorgan failure. Pt self extubated and is currently NPO; more awake and following commands. Pt was reintubated on 11.15.16 and extubated again on 11.22.16. He also has a chest tube in place on his Left side. Pt is awaken and conversing w/ST.        SLP Plan  Discharge SLP treatment due to  (comment) (pt reports no concerns, no s/s aspiration observed,@baseline )     Recommendations  Diet recommendations: Dysphagia 3 (mechanical soft);Thin liquid Liquids provided via: Cup;Straw Medication Administration: Whole meds with puree Supervision: Staff to assist with self feeding Compensations: Minimize environmental distractions;Slow rate;Small sips/bites Postural Changes and/or Swallow Maneuvers: Seated upright 90 degrees              Oral Care Recommendations: Oral care BID;Staff/trained caregiver to provide oral care Plan: Discharge SLP treatment due to (comment) (pt reports no concerns, no s/s aspiration observed,@baseline )   Lowell Mcgurk 09/14/2015, 9:04 AM

## 2015-09-14 NOTE — Plan of Care (Signed)
Problem: Education: Goal: Knowledge of [facility] Education information/materials will improve Outcome: Not Progressing Continue to reinforce general info.   Problem: Safety: Goal: Ability to remain free from injury will improve Outcome: Progressing Pt remained w/out injury during shift.  Responded to bed alarm promptly - pt would remember to call out occasionally. [facility] and to stand.    Problem: Health Behavior/Discharge Planning: Goal: Ability to manage health-related needs will improve Outcome: Progressing Pt is being d/ced to [facility].  Pt unable to manage daily needs except for feeding himself. Pt will be going to [facility].  Called report to [phone] - spoke to Fairtonhelma but Ethelene Brownsnthony will be his nurse.   Problem: Pain Managment: Goal: General experience of comfort will improve Outcome: Progressing Pt had no c/o pain.   Problem: Physical Regulation: Goal: Ability to maintain clinical measurements within normal limits will improve Outcome: Not Progressing Pt is tolerating diet.   Problem: Skin Integrity: Goal: Risk for impaired skin integrity will decrease Outcome: Not Progressing Pt has MASD in the groin area and gluteal cleft from incontinence. Applying barrier cream to area to repel moisture.   Problem: Tissue Perfusion: Goal: Risk factors for ineffective tissue perfusion will decrease Outcome: Progressing Pt's VS stable.  Sating 100% on RA.  On scheduled heparin.   Problem: Bowel/Gastric: Goal: Will not experience complications related to bowel motility Outcome: Progressing Pt continues to have loose stools - but he's only had 2 today and they are becoming more firm.

## 2015-09-14 NOTE — Care Management Important Message (Signed)
Important Message  Patient Details  Name: Ross [name] MRN: [id number] Date of Birth: Jan 11, 1966   Medicare Important Message Given:  Yes    Gwenette GreetBrenda S Jalyah Weinheimer, RN 09/14/2015, 11:11 AM

## 2015-09-14 NOTE — Consult Note (Signed)
[facility] Consult   Reason for Consult:  Consult for this 49 year old man with a history of mental health problems who is recovering from a severe illness and is in the process of being discharged Referring Physician:  Manuella Ghazi Patient Identification: Ross [name] MRN:  [id number] Principal Diagnosis: Schizophrenia Diagnosis:   Patient Active Problem List   Diagnosis Date Noted  . Paranoid schizophrenia ([place]) [F20.0] 09/14/2015  . Pleural effusion [J90]   . Acute respiratory failure with hypoxia ([place]) [J96.01]   . Altered mental status [R41.82]   . Acute respiratory failure ([place]) [J96.00] 08/23/2015  . Enteritis due to Clostridium difficile [A04.7] 08/23/2015  . Rhabdomyolysis [M62.82] 08/23/2015  . Acute renal failure ([place]) [N17.9] 08/23/2015  . Sepsis ([place]) [A41.9] 08/21/2015  . Psychoses [F29]   . Psychosis [F29] 03/28/2015  . HTN (hypertension) [I10] 09/12/2011  . Aspiration pneumonia ([place]) [J69.0] 09/11/2011  . RUQ abdominal pain [R10.11] 09/11/2011  . SOB (shortness of breath) [R06.02] 09/11/2011  . Pneumonia [J18.9] 09/04/2011  . Fever [R50.9] 09/04/2011  . Acute pancreatitis [K85.90] 09/03/2011  . Abdominal pain [789.0] 09/03/2011  . Acute encephalopathy [G93.40] 08/28/2011  . Hypernatremia [E87.0] 08/27/2011  . Poisoning by anticholinergic drug [T44.3X1A] 08/23/2011  . Eczema [L30.9] 08/23/2011  . Papular rash, generalized [R21] 08/23/2011  . HTN (hypertension), malignant [I10] 08/23/2011  . Hyperglycemia [R73.9] 08/23/2011  . PTSD (post-traumatic stress disorder) [F43.10] 08/23/2011  . Cardiac arrest ([place]) [I46.9] 08/23/2011  . QT prolongation [I45.81] 08/23/2011    Total Time spent with patient: 1 hour  Subjective:   Ross [name] is a 49 y.o. male patient admitted with "I've got anger and anxiety".  HPI:  Information from the patient and the chart. Patient interviewed. Chart reviewed. Old notes reviewed as best I could. Case  discussed with nursing and with hospitalist. 49 year old man who has been in the hospital since about November 4 after being found passed out at home with probable pneumonia with subsequent sepsis and respiratory failure and rhabdomyolysis. Finally recovering from all of that. Patient evidently had requested a psychiatrist to come by and talk with him. He tells me that he wants "coping skills" to deal with his "anger and anxiety". Patient is a not very direct historian. He talks about having anger and anxiety but has a difficult time actually describing them. He says a lot of his anxiety is related to his adolescent son because he wants to improve his relationship with him. He says he has anger towards his ex-wife and his belief that she was involved with another man. Patient denies that he is feeling depressed. Denies that he is feeling sad or tearful. Says that he is starting to sleep better. Appetite is adequate. He has not been recently taking any psychiatric medication. He absolutely denies having any suicidal thoughts. He says that he gets some angry thoughts towards the man he believes his wife was having an affair with but knows that it would be wrong to act on it and has no intention or plan of doing anything violent. He is a little bit evasive when asked about hallucinations but ultimately denies that he's been having any currently. He admits that he drinks very heavily although he says that now he feels that he is getting the problem under control. Throughout all of the history I detect what sounds like a theme of paranoia and disorganized thinking to the patient. He talks about how he plans to go to law school after moving to [place] shortly. He  doesn't seem to of really internalize the fact that he is being transferred to a longer term hospital at this time.  Family history: Patient denies that there is any family history of mental health problems or substance abuse problems  Medical history: He is  recovering from severe medical illness. Has been here in the hospital for almost 4 weeks.  Substance abuse history: Well-known history of alcohol dependence probably with alcohol withdrawal seizures which May of contributed to his respiratory problems this time. Unclear whether he has other drug problems but he denies it.  Social history: Patient evidently lives by himself in an apartment. From what I can tell from previous notes his mother is his closest contact. 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  Past Psychiatric History: Patient says that he has had psychiatric treatment in the past mostly at the [facility] hospital in [place] or through the local that center. He says that he has doctors back at the [place]. He indicates that he has had multiple antipsychotic and other psychiatric medicines over the years but claims to not be able to remember any of them or know anything about whether any of them were of any affect. He denies any suicidal history. Denies that he has been significantly violent. Patient is that he has been diagnosed with PTSD. I inquired a little bit about his military service and he told me a somewhat odd story that doesn't really fit with the history and the rest of the chart. 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 I would say that most of what he is talking about in the time I spent with him sounds more like somebody with a chronic psychotic disorder then like PTSD although it's hard to be certain. Because of difficulty obtaining [facility] records we don't have any other direct information about that and would be unlikely to get any before he gets discharged.  Risk to Self: Is patient at risk for suicide?: No Risk to Others:   Prior Inpatient Therapy:   Prior Outpatient Therapy:    Past Medical History:  Past Medical History  Diagnosis Date  . Sleep apnea     had surgery to correct it  . Mental disorder     PTSD  . Seizures ([place])      currently dx with seizures  . Cardiac arrest ([place]) 08/23/2011  . Pneumonia 09/04/2011  . Pancreatitis   . SOB (shortness of breath) 09/11/2011  . Hypertension   . Anxiety   . Myocardial infarction ([place])   . Schizophrenia ([place])   . Rhabdomyolysis 08/23/2015  . Acute renal failure ([place]) 08/23/2015    Past Surgical History  Procedure Laterality Date  . Tonsillectomy    . Uvulopalatopharyngoplasty, tonsillectomy and septoplasty  06/16/2000  . Left heart catheterization with coronary angiogram N/A 08/24/2011    Procedure: LEFT HEART CATHETERIZATION WITH CORONARY ANGIOGRAM;  Surgeon: Jettie Booze, MD;  Location[facility] CATH LAB;  Service: Cardiovascular;  Laterality: N/A;  . Intubation  08/26/2015        Family History:  Family History  Problem Relation Age of Onset  . Heart attack Mother    Family Psychiatric  History: Patient denies that there is any family history of mental illness or substance abuse problems Social History:  History  Alcohol Use  . Yes    Comment: heavy, has a "drinking problem" per mother     History  Drug Use No    Social History  Social History  . Marital Status: Married    Spouse Name: N/A  . Number of Children: N/A  . Years of Education: N/A   Social History Main Topics  . Smoking status: Former Smoker    Types: Cigarettes  . Smokeless tobacco: Never Used  . Alcohol Use: Yes     Comment: heavy, has a "drinking problem" per mother  . Drug Use: No  . Sexual Activity: Not Currently   Other Topics Concern  . None   Social History Narrative   Additional Social History:                          Allergies:   Allergies  Allergen Reactions  . Antihistamines, Diphenhydramine-Type Anaphylaxis and Rash  . Benadryl [Diphenhydramine Hcl] Anaphylaxis and Rash  . Hydroxyzine Anaphylaxis and Rash    Labs:  Results for orders placed or performed during the hospital encounter of 08/21/15 (from the past 48 hour(s))  CBC     Status:  Abnormal   Collection Time: 09/13/15  7:25 AM  Result Value Ref Range   WBC 7.8 3.8 - 10.6 K/uL   RBC 3.19 (L) 4.40 - 5.90 MIL/uL   Hemoglobin 9.0 (L) 13.0 - 18.0 g/dL   HCT 27.6 (L) 40.0 - 52.0 %   MCV 86.5 80.0 - 100.0 fL   MCH 28.3 26.0 - 34.0 pg   MCHC 32.7 32.0 - 36.0 g/dL   RDW 13.9 11.5 - 14.5 %   Platelets 169 150 - 440 K/uL  Basic metabolic panel     Status: Abnormal   Collection Time: 09/13/15  7:25 AM  Result Value Ref Range   Sodium 140 135 - 145 mmol/L   Potassium 3.4 (L) 3.5 - 5.1 mmol/L   Chloride 111 101 - 111 mmol/L   CO2 20 (L) 22 - 32 mmol/L   Glucose, Bld 81 65 - 99 mg/dL   BUN 31 (H) 6 - 20 mg/dL   Creatinine, Ser 2.66 (H) 0.61 - 1.24 mg/dL   Calcium 8.4 (L) 8.9 - 10.3 mg/dL   GFR calc non Af Amer 27 (L) >60 mL/min   GFR calc Af Amer 31 (L) >60 mL/min    Comment: (NOTE) The eGFR has been calculated using the CKD EPI equation. This calculation has not been validated in all clinical situations. eGFR's persistently <60 mL/min signify possible Chronic Kidney Disease.    Anion gap 9 5 - 15  CBC     Status: Abnormal   Collection Time: 09/14/15  5:09 AM  Result Value Ref Range   WBC 7.0 3.8 - 10.6 K/uL   RBC 3.28 (L) 4.40 - 5.90 MIL/uL   Hemoglobin 9.3 (L) 13.0 - 18.0 g/dL   HCT 28.5 (L) 40.0 - 52.0 %   MCV 87.0 80.0 - 100.0 fL   MCH 28.4 26.0 - 34.0 pg   MCHC 32.6 32.0 - 36.0 g/dL   RDW 13.7 11.5 - 14.5 %   Platelets 178 150 - 440 K/uL  Basic metabolic panel     Status: Abnormal   Collection Time: 09/14/15  5:09 AM  Result Value Ref Range   Sodium 137 135 - 145 mmol/L   Potassium 3.7 3.5 - 5.1 mmol/L   Chloride 111 101 - 111 mmol/L   CO2 20 (L) 22 - 32 mmol/L   Glucose, Bld 109 (H) 65 - 99 mg/dL   BUN 25 (H) 6 - 20 mg/dL   Creatinine, Ser 2.48 (  H) 0.61 - 1.24 mg/dL   Calcium 7.7 (L) 8.9 - 10.3 mg/dL   GFR calc non Af Amer 29 (L) >60 mL/min   GFR calc Af Amer 34 (L) >60 mL/min    Comment: (NOTE) The eGFR has been calculated using the CKD EPI  equation. This calculation has not been validated in all clinical situations. eGFR's persistently <60 mL/min signify possible Chronic Kidney Disease.    Anion gap 6 5 - 15  Magnesium     Status: Abnormal   Collection Time: 09/14/15  5:09 AM  Result Value Ref Range   Magnesium 1.4 (L) 1.7 - 2.4 mg/dL  Glucose, capillary     Status: Abnormal   Collection Time: 09/14/15  2:01 PM  Result Value Ref Range   Glucose-Capillary 103 (H) 65 - 99 mg/dL    Current Facility-Administered Medications  Medication Dose Route Frequency Provider Last Rate Last Dose  . acetaminophen (TYLENOL) tablet 650 mg  650 mg Oral Q6H PRN Vishal Mungal, MD   650 mg at 09/12/15 2248   Or  . acetaminophen (TYLENOL) suppository 650 mg  650 mg Rectal Q6H PRN Vishal Mungal, MD      . antiseptic oral rinse solution (CORINZ)  7 mL Mouth Rinse QID Wilhelmina Mcardle, MD   7 mL at 09/13/15 1433  . chlorhexidine gluconate (PERIDEX) 0.12 % solution 15 mL  15 mL Mouth Rinse BID Flora Lipps, MD   15 mL at 09/13/15 1958  . feeding supplement (ENSURE ENLIVE) (ENSURE ENLIVE) liquid 237 mL  237 mL Oral TID WC Harmeet Singh, MD   237 mL at 09/13/15 1737  . heparin injection 5,000 Units  5,000 Units Subcutaneous 3 times per day Epifanio Lesches, MD   5,000 Units at 09/14/15 0523  . hydrALAZINE (APRESOLINE) injection 10 mg  10 mg Intravenous Q4H PRN Lance Coon, MD   10 mg at 09/08/15 1710  . ipratropium-albuterol (DUONEB) 0.5-2.5 (3) MG/3ML nebulizer solution 3 mL  3 mL Nebulization Q4H PRN Wilhelmina Mcardle, MD      . ondansetron [facility]) injection 4 mg  4 mg Intravenous Q8H PRN Vilinda Boehringer, MD   4 mg at 08/28/15 1052    Musculoskeletal: Strength & Muscle Tone: decreased Gait & Station: normal Patient leans: N/A  Psychiatric Specialty Exam: Review of Systems  HENT: Negative.   Eyes: Negative.   Respiratory: Negative.   Cardiovascular: Negative.   Gastrointestinal: Negative.   Musculoskeletal: Negative.   Skin: Negative.    Neurological: Positive for weakness.  Psychiatric/Behavioral: Positive for memory loss and substance abuse. Negative for depression, suicidal ideas and hallucinations. The patient is nervous/anxious. The patient does not have insomnia.     Blood pressure 126/73, pulse 90, temperature 98.7 F (37.1 C), temperature source Oral, resp. rate 19, height 5' 11"  (1.803 m), weight 94.348 kg (208 lb), SpO2 100 %.Body mass index is 29.02 kg/(m^2).  General Appearance: Casual  Eye Contact::  Fair  Speech:  Slow  Volume:  Decreased  Mood:  Anxious  Affect:  Flat  Thought Process:  Linear and Tangential  Orientation:  Full (Time, Place, and Person)  Thought Content:  Paranoid Ideation  Suicidal Thoughts:  No  Homicidal Thoughts:  No  Memory:  Immediate;   Fair Recent;   Fair Remote;   Fair  Judgement:  Fair  Insight:  Fair  Psychomotor Activity:  Decreased  Concentration:  Fair  Recall:  [employer] of Knowledge:Fair  Language: Fair  Akathisia:  No  Handed:  Right  AIMS (if indicated):     Assets:  Desire for Improvement Financial Resources/Insurance Housing Resilience Social Support  ADL's:  Intact  Cognition: WNL  Sleep:      Treatment Plan Summary: Plan This is a 49 year old man who has a somewhat confusing past psychiatric history. The old chart documents diagnoses of both PTSD and schizophrenia. These are too extremely different diagnoses and not particularly likely to co-occur. The patient's history right now is rather vague. My overall sense is that he is still having some paranoia and confused thinking. I suggested to him that we consider antipsychotic medication but he couldn't give me any past history about medicines he has taken and overall declined to take any medicine right now. I absolutely strongly stated to him that he should follow-up with the [facility] as that is the best care that he could get if he is a regular client at the [facility]. Whenever mental health problems he has  I'm sure will become more evident with time. Supportive counseling done. Otherwise I don't really have time before he is discharged today to do much more treatment. If he were still in the hospital after today I would follow-up.  Disposition: Patient does not meet criteria for psychiatric inpatient admission. Supportive therapy provided about ongoing stressors. Discussed crisis plan, support from social network, calling 911, coming to the Emergency Department, and calling Suicide Hotline.  Nashika Coker 09/14/2015 2:45 PM

## 2015-09-14 NOTE — Plan of Care (Signed)
Problem: Education: Goal: Knowledge of  General Education information/materials will improve Outcome: Not Progressing Continue to reinforce general information  Problem: Safety: Goal: Ability to remain free from injury will improve Outcome: Progressing No injury this shift  Problem: Health Behavior/Discharge Planning: Goal: Ability to manage health-related needs will improve Outcome: Not Progressing Pt unable to manage daily needs  Problem: Pain Managment: Goal: General experience of comfort will improve Outcome: Progressing Denies pain  Problem: Skin Integrity: Goal: Risk for impaired skin integrity will decrease Outcome: Progressing Skin care provided  Problem: Tissue Perfusion: Goal: Risk factors for ineffective tissue perfusion will decrease Outcome: Progressing Heparin given  Problem: Nutrition: Goal: Adequate nutrition will be maintained Outcome: Progressing Tolerating diet  Problem: Bowel/Gastric: Goal: Will not experience complications related to bowel motility Outcome: Not Progressing Continues to have loose stools

## 2015-09-14 NOTE — Telephone Encounter (Signed)
-----   Message from Stephanie AcreVishal Mungal, MD sent at 09/11/2015 12:04 PM EST ----- Regarding: Hosp Follow up  Please schedule hospital follow up with Dr. Sung AmabileSimonds in 2-3 weeks.   Thank you  -VM

## 2015-09-14 NOTE — Plan of Care (Signed)
Pt is going to [facility].  Called report to [phone] and spoke to [place].  Pt will be leaving w/PICC line in place.  Called EMS for transport.

## 2015-09-14 NOTE — Care Management (Addendum)
Received text message from Sue Lushndrea at [facility]. Can offer a bed today.  Discussed going to [facility] with Mr. Ross [name] at the bedside. States that he would like to transfer to [facility]. Dr. Clelia CroftShaw updated. [facility] Will [facility].  Mr. Ross [name] indicated that he had notified his family members of the transfer to [facility].  Gwenette GreetBrenda S Nason Conradt RN MSN CCM Care Management [phone]

## 2015-09-14 NOTE — Telephone Encounter (Signed)
Called [facility] to schedule f/u appt for pt but pt was being d/c'd to [facility]. Called [facility] at [phone] ext. [phone] and spoke with Diane. appt scheduled for 09-28-15 @11am . Nothing further needed.

## 2015-09-14 NOTE — Progress Notes (Signed)
Subjective:  Creatinine currently down to 2.48. Patient appears to be in good spirits this a.m. Has been making slow steady progress.  Objective:  Vital signs in last 24 hours:  Temp:  [98.6 F (37 C)-99.6 F (37.6 C)] 98.7 F (37.1 C) (11/28 1241) Pulse Rate:  [73-90] 90 (11/28 1241) Resp:  [18-19] 19 (11/28 0350) BP: (122-147)/(73-86) 126/73 mmHg (11/28 1241) SpO2:  [99 %-100 %] 100 % (11/28 1241) Weight:  [94.348 kg (208 lb)] 94.348 kg (208 lb) (11/28 0350)  Weight change: -3.629 kg (-8 lb) Filed Weights   09/12/15 0445 09/13/15 0340 09/14/15 0350  Weight: 99.927 kg (220 lb 4.8 oz) 97.977 kg (216 lb) 94.348 kg (208 lb)    Intake/Output:    Intake/Output Summary (Last 24 hours) at 09/14/15 1339 Last data filed at 09/14/15 0035  Gross per 24 hour  Intake      0 ml  Output    500 ml  Net   -500 ml     Physical Exam: General: No acute distress  HEENT [place]/AT, EOMI OM moist  Neck supple  Pulm/lungs CTAB normal effort  CVS/Heart S1S2 no rubs  Abdomen:  soft, NTND BS present  Extremities: + b/l LE edema  Neurologic: Awake, alert, following commands, conversant  Skin: no acute rashes           Basic Metabolic Panel:   Recent Labs Lab 09/10/15 0430 09/11/15 0530 09/12/15 0445 09/13/15 0725 09/14/15 0509  NA 144 141 141 140 137  K 3.7 3.3* 3.6 3.4* 3.7  CL 115* 112* 113* 111 111  CO2 20* 21* 21* 20* 20*  GLUCOSE 105* 97 86 81 109*  BUN 47* 40* 37* 31* 25*  CREATININE 3.51* 3.13* 2.92* 2.66* 2.48*  CALCIUM 9.1 8.6* 8.6* 8.4* 7.7*  MG  --   --   --   --  1.4*     CBC:  Recent Labs Lab 09/10/15 0430 09/11/15 0530 09/12/15 0445 09/13/15 0725 09/14/15 0509  WBC 10.5 9.9 8.3 7.8 7.0  HGB 9.0* 9.0* 9.3* 9.0* 9.3*  HCT 27.5* 27.0* 27.9* 27.6* 28.5*  MCV 86.9 87.2 86.4 86.5 87.0  PLT 228 179 180 169 178      Microbiology:  Recent Results (from the past 720 hour(s))  Blood Culture (routine x 2)     Status: None   Collection Time: 08/21/15  12:47 PM  Result Value Ref Range Status   Specimen Description BLOOD UNKNOWN  Final   Special Requests BOTTLES DRAWN AEROBIC AND ANAEROBIC  1CC  Final   Culture NO GROWTH 10 DAYS  Final   Report Status 08/31/2015 FINAL  Final  Blood Culture (routine x 2)     Status: None   Collection Time: 08/21/15 12:47 PM  Result Value Ref Range Status   Specimen Description BLOOD LEFT AC  Final   Special Requests BOTTLES DRAWN AEROBIC AND ANAEROBIC  6CC  Final   Culture NO GROWTH 10 DAYS  Final   Report Status 08/31/2015 FINAL  Final  Urine culture     Status: None   Collection Time: 08/21/15 12:47 PM  Result Value Ref Range Status   Specimen Description URINE, CATHETERIZED  Final   Special Requests Normal  Final   Culture NO GROWTH 2 DAYS  Final   Report Status 08/23/2015 FINAL  Final  MRSA PCR Screening     Status: None   Collection Time: 08/21/15 11:47 PM  Result Value Ref Range Status   MRSA by PCR NEGATIVE  NEGATIVE Final    Comment:        The GeneXpert MRSA Assay (FDA approved for NASAL specimens only), is one component of a comprehensive MRSA colonization surveillance program. It is not intended to diagnose MRSA infection nor to guide or monitor treatment for MRSA infections.   C difficile quick scan w PCR reflex     Status: Abnormal   Collection Time: 08/22/15  2:31 PM  Result Value Ref Range Status   C Diff antigen POSITIVE (A) NEGATIVE Final   C Diff toxin POSITIVE (A) NEGATIVE Final   C Diff interpretation   Final    Positive for toxigenic C. difficile, active toxin production present.    Comment: CRITICAL RESULT CALLED TO, READ BACK BY AND VERIFIED WITH: CHARLIE FLEETWOOD,RN 08/22/2015 1544 BY J RAZZAKSUAREZ,MT   Culture, bal-quantitative     Status: None   Collection Time: 08/25/15  3:15 PM  Result Value Ref Range Status   Specimen Description BRONCHIAL ALVEOLAR LAVAGE  Final   Special Requests NONE  Final   Gram Stain   Final    GOOD SPECIMEN - 80-90% WBCS FEW WBC  SEEN NO ORGANISMS SEEN    Culture HOLDING FOR POSSIBLE PATHOGEN  Final   Report Status 08/27/2015 FINAL  Final  Culture, blood (routine x 2)     Status: None   Collection Time: 09/02/15 12:00 PM  Result Value Ref Range Status   Specimen Description BLOOD RIGHT GROIN  Final   Special Requests   Final    BOTTLES DRAWN AEROBIC AND ANAEROBIC  6CC AERO 10CC ANAERO   Culture NO GROWTH 7 DAYS  Final   Report Status 09/09/2015 FINAL  Final  Culture, blood (routine x 2)     Status: None   Collection Time: 09/02/15 12:15 PM  Result Value Ref Range Status   Specimen Description BLOOD RIGHT GROIN  Final   Special Requests BOTTLES DRAWN AEROBIC AND ANAEROBIC  10 CC  Final   Culture NO GROWTH 7 DAYS  Final   Report Status 09/09/2015 FINAL  Final  Culture, blood (routine x 2)     Status: None   Collection Time: 09/02/15  1:47 PM  Result Value Ref Range Status   Specimen Description BLOOD RIGHT HAND  Final   Special Requests BOTTLES DRAWN AEROBIC AND ANAEROBIC 4CC  Final   Culture NO GROWTH 7 DAYS  Final   Report Status 09/09/2015 FINAL  Final  Culture, blood (routine x 2)     Status: None   Collection Time: 09/02/15  2:06 PM  Result Value Ref Range Status   Specimen Description BLOOD LEFT HAND  Final   Special Requests BOTTLES DRAWN AEROBIC AND ANAEROBIC 1CC  Final   Culture NO GROWTH 7 DAYS  Final   Report Status 09/09/2015 FINAL  Final  MRSA PCR Screening     Status: None   Collection Time: 09/02/15 10:27 PM  Result Value Ref Range Status   MRSA by PCR NEGATIVE NEGATIVE Final    Comment:        The GeneXpert MRSA Assay (FDA approved for NASAL specimens only), is one component of a comprehensive MRSA colonization surveillance program. It is not intended to diagnose MRSA infection nor to guide or monitor treatment for MRSA infections.   Culture, expectorated sputum-assessment     Status: None   Collection Time: 09/03/15 12:18 AM  Result Value Ref Range Status   Specimen  Description SPUTUM  Final   Special Requests Normal  Final  Sputum evaluation THIS SPECIMEN IS ACCEPTABLE FOR SPUTUM CULTURE  Final   Report Status 09/03/2015 FINAL  Final  Culture, respiratory (NON-Expectorated)     Status: None   Collection Time: 09/03/15 12:18 AM  Result Value Ref Range Status   Specimen Description SPUTUM  Final   Special Requests Normal Reflexed from [id number]  Final   Gram Stain   Final    EXCELLENT SPECIMEN - 90-100% WBCS MANY WBC SEEN MANY GRAM NEGATIVE COCCOBACILLI    Culture MODERATE GROWTH CITROBACTER KOSERI  Final   Report Status 09/05/2015 FINAL  Final   Organism ID, Bacteria CITROBACTER KOSERI  Final      Susceptibility   Citrobacter koseri - MIC*    CEFAZOLIN <=4 SENSITIVE Sensitive     CEFTRIAXONE <=1 SENSITIVE Sensitive     CIPROFLOXACIN <=0.25 SENSITIVE Sensitive     GENTAMICIN <=1 SENSITIVE Sensitive     IMIPENEM <=0.25 SENSITIVE Sensitive     TRIMETH/SULFA <=20 SENSITIVE Sensitive     PIP/TAZO Value in next row Sensitive      SENSITIVE<=4    LEVOFLOXACIN Value in next row Sensitive      SENSITIVE<=0.12    * MODERATE GROWTH CITROBACTER KOSERI  Body fluid culture     Status: None   Collection Time: 09/04/15 10:30 AM  Result Value Ref Range Status   Specimen Description CYTO PLEU  Final   Special Requests NONE  Final   Gram Stain   Final    MODERATE WBC SEEN TOO NUMEROUS TO COUNT RED BLOOD CELLS NO ORGANISMS SEEN    Culture No growth aerobically or anaerobically.  Final   Report Status 09/08/2015 FINAL  Final  Body fluid culture     Status: None   Collection Time: 09/07/15 10:55 AM  Result Value Ref Range Status   Specimen Description CYTO PLEU  Final   Special Requests NONE  Final   Gram Stain FEW WBC SEEN NO ORGANISMS SEEN   Final   Culture No growth aerobically or anaerobically.  Final   Report Status 09/11/2015 FINAL  Final    Coagulation Studies: No results for input(s): LABPROT, INR in the last 72 hours.  Urinalysis: No  results for input(s): COLORURINE, LABSPEC, PHURINE, GLUCOSEU, HGBUR, BILIRUBINUR, KETONESUR, PROTEINUR, UROBILINOGEN, NITRITE, LEUKOCYTESUR in the last 72 hours.  Invalid input(s): APPERANCEUR    Imaging: No results found.   Medications:     . antiseptic oral rinse  7 mL Mouth Rinse QID  . chlorhexidine gluconate  15 mL Mouth Rinse BID  . feeding supplement (ENSURE ENLIVE)  237 mL Oral TID WC  . heparin subcutaneous  5,000 Units Subcutaneous 3 times per day   acetaminophen **OR** acetaminophen, hydrALAZINE, ipratropium-albuterol, ondansetron (ZOFRAN) IV  Assessment/ Plan:  49 y.o. male With alcohol abuse, schizophrenia, seizure disorder, coronary disease/MI 2012 was admitted to [facility] on November 4 after being found unresponsive in his apartment. Urine toxicology screen is positive for benzodiazepines and cannabinoids. Stool positive for C. Difficile  1. Acute renal failure, likely secondary to ATN from concurrent illness, volume depletion, C. difficile, rhabdomyolysis and sepsis.   - creatinine continues to decline.  Currently down to 2.48.  No indication for dialysis at this time.  Continue to monitor renal function trend.  2. Acute respiratory failure due to bacterial pneumonia/Left pleural effusion s/p thoracentesis 11/18. - chest tube 11/22 - doing wellpost extubation.    3.  Hypernatremia. Resolved at present.  Serum sodium 137 at present  4.  Hypokalemia. K improved to  3.7 today.  5. Infective Colitis A09: c. Diff positive.  - treated with PO vancomycin previously.  6.  Acute rhabdomyolysis M62.82: Contributed to acute renal failure, resolved with conservative management.   LOS: 24 Phylliss Strege 11/28/20161:39 PM

## 2015-09-14 NOTE — Discharge Instructions (Signed)

## 2015-09-14 NOTE — Progress Notes (Signed)
I paged Dr. Sherryll BurgerShah to discuss a possible inpt rehab consult order for me to do an onsite assessment for an inpatient Acute rehabilitation admission. I contacted Steward DroneBrenda, RN CM, by phone. She states pt plans to d/c to [facility]. I will hold onsite assessment at this time. Please call me if d/c dispo changes. [phone]

## 2015-09-14 NOTE — Clinical Social Work Note (Signed)
Pt will be discharging to [facility] today. RNCM is following for discharge planning needs. CSW is signing off as no further needs identified.   Dede QuerySarah Martha Ellerby, MSW, LCSW Clinical Social Worker  [phone]

## 2015-09-22 LAB — PH, BODY FLUID
PH, BODY FLUID: 7.7
PH, BODY FLUID: 7.8

## 2015-09-28 ENCOUNTER — Encounter: Payer: Self-pay | Admitting: *Deleted

## 2015-09-28 ENCOUNTER — Inpatient Hospital Stay: Payer: Self-pay | Admitting: Pulmonary Disease

## 2016-08-07 ENCOUNTER — Emergency Department
Admission: EM | Admit: 2016-08-07 | Discharge: 2016-08-07 | Disposition: A | Discharge status: Home / Self Care | Payer: Non-veteran care | Attending: Emergency Medicine | Admitting: Emergency Medicine

## 2016-08-07 ENCOUNTER — Encounter: Payer: Self-pay | Admitting: Emergency Medicine

## 2016-08-07 DIAGNOSIS — Z5181 Encounter for therapeutic drug level monitoring: Secondary | ICD-10-CM | POA: Diagnosis not present

## 2016-08-07 DIAGNOSIS — F17210 Nicotine dependence, cigarettes, uncomplicated: Secondary | ICD-10-CM | POA: Insufficient documentation

## 2016-08-07 DIAGNOSIS — I252 Old myocardial infarction: Secondary | ICD-10-CM | POA: Diagnosis not present

## 2016-08-07 DIAGNOSIS — R569 Unspecified convulsions: Secondary | ICD-10-CM

## 2016-08-07 DIAGNOSIS — I10 Essential (primary) hypertension: Secondary | ICD-10-CM | POA: Diagnosis not present

## 2016-08-07 DIAGNOSIS — G40909 Epilepsy, unspecified, not intractable, without status epilepticus: Secondary | ICD-10-CM | POA: Insufficient documentation

## 2016-08-07 HISTORY — DX: Post-traumatic stress disorder, unspecified: F43.10

## 2016-08-07 LAB — BASIC METABOLIC PANEL
ANION GAP: 13 (ref 5–15)
BUN: 10 mg/dL (ref 6–20)
CHLORIDE: 105 mmol/L (ref 101–111)
CO2: 20 mmol/L — AB (ref 22–32)
Calcium: 9.7 mg/dL (ref 8.9–10.3)
Creatinine, Ser: 1.28 mg/dL — ABNORMAL HIGH (ref 0.61–1.24)
GFR calc Af Amer: >60 mL/min (ref >60)
GFR calc non Af Amer: >60 mL/min (ref >60)
GLUCOSE: 128 mg/dL — AB (ref 65–99)
Potassium: 4.5 mmol/L (ref 3.5–5.1)
Sodium: 138 mmol/L (ref 135–145)

## 2016-08-07 LAB — CBC WITH DIFFERENTIAL/PLATELET
BASOS ABS: 0 10*3/uL (ref 0–0.1)
Basophils Relative: 1 %
EOS PCT: 1 %
Eosinophils Absolute: 0 10*3/uL (ref 0–0.7)
HEMATOCRIT: 49.2 % (ref 40.0–52.0)
Hemoglobin: 16.6 g/dL (ref 13.0–18.0)
LYMPHS ABS: 0.9 10*3/uL — AB (ref 1.0–3.6)
LYMPHS PCT: 23 %
MCH: 28.6 pg (ref 26.0–34.0)
MCHC: 33.7 g/dL (ref 32.0–36.0)
MCV: 84.8 fL (ref 80.0–100.0)
MONO ABS: 0.3 10*3/uL (ref 0.2–1.0)
Monocytes Relative: 8 %
NEUTROS ABS: 2.7 10*3/uL (ref 1.4–6.5)
Neutrophils Relative %: 67 %
PLATELETS: 116 10*3/uL — AB (ref 150–440)
RBC: 5.81 MIL/uL (ref 4.40–5.90)
RDW: 17.3 % — AB (ref 11.5–14.5)
WBC: 3.9 10*3/uL (ref 3.8–10.6)

## 2016-08-07 LAB — URINE DRUG SCREEN, QUALITATIVE (ARMC ONLY)
AMPHETAMINES, UR SCREEN: NOT DETECTED
Barbiturates, Ur Screen: NOT DETECTED
Benzodiazepine, Ur Scrn: NOT DETECTED
CANNABINOID 50 NG, UR ~~LOC~~: POSITIVE — AB
Cocaine Metabolite,Ur ~~LOC~~: NOT DETECTED
MDMA (ECSTASY) UR SCREEN: NOT DETECTED
Methadone Scn, Ur: NOT DETECTED
OPIATE, UR SCREEN: NOT DETECTED
PHENCYCLIDINE (PCP) UR S: NOT DETECTED
Tricyclic, Ur Screen: NOT DETECTED

## 2016-08-07 LAB — URINALYSIS COMPLETE WITH MICROSCOPIC (ARMC ONLY)
Bilirubin Urine: NEGATIVE
Glucose, UA: NEGATIVE mg/dL
Ketones, ur: NEGATIVE mg/dL
Leukocytes, UA: NEGATIVE
Nitrite: NEGATIVE
PH: 5 (ref 5.0–8.0)
PROTEIN: 100 mg/dL — AB
Specific Gravity, Urine: 1.017 (ref 1.005–1.030)

## 2016-08-07 MED ORDER — LEVETIRACETAM 500 MG PO TABS
500.0000 mg | ORAL_TABLET | Freq: Once | ORAL | Status: AC
  Administered 2016-08-07: 500 mg via ORAL
  Filled 2016-08-07: qty 1

## 2016-08-07 MED ORDER — LEVETIRACETAM 500 MG PO TABS: 500.0000 mg | ORAL_TABLET | Freq: Two times a day (BID) | ORAL | 0 refills | Status: DC

## 2016-08-07 MED ORDER — LORAZEPAM 2 MG/ML IJ SOLN
1.0000 mg | Freq: Once | INTRAMUSCULAR | Status: AC
  Administered 2016-08-07: 1 mg via INTRAVENOUS
  Filled 2016-08-07: qty 1

## 2016-08-07 NOTE — ED Notes (Signed)
See triage note  Family states she heard a noise and saw him roll from bed   States he has had a seizure about 1 year ago but not placed on any meds for same

## 2016-08-07 NOTE — ED Provider Notes (Signed)
[facility] Emergency Department Provider Note ____________________________________________   I have reviewed the triage vital signs and the triage nursing note.  HISTORY   Chief Complaint Seizures   Historian Patient and significant other  HPI Ross [name] is a 50 y.o. male with a medical history by chart of PTSD, paranoid schizophrenia, and anxiety with a history of alcohol abuse which she reports as 224 ounce beers and several shots of liquor daily, presents today after a seizure. Significant other noted seizure activity. He's had one seizure previously which she was not with him during that episode which was it sounded a few years ago. That point in time he had stopped drinking and several days later had a seizure, and it was felt to be alcohol withdrawal towards the patient. He states he did not have any additional workup and was not placed on medications. [facility], and he is not interested in quitting alcohol at this point time.  The template seizure lasted less than 5 minutes, extending list on its own. Patient was reportedly agitated and postictal when EMS picked him up, but is calm now.   Past Medical History:  Diagnosis Date  . Acute renal failure (HCC) 08/23/2015  . Anxiety   . Cardiac arrest (HCC) 08/23/2011  . Hypertension   . Mental disorder    PTSD  . Myocardial infarction   . Pancreatitis   . Pneumonia 09/04/2011  . PTSD (post-traumatic stress disorder)   . Rhabdomyolysis 08/23/2015  . Schizophrenia (HCC)   . Seizures (HCC)    currently dx with seizures  . Sleep apnea    had surgery to correct it  . SOB (shortness of breath) 09/11/2011    Patient Active Problem List   Diagnosis Date Noted  . Paranoid schizophrenia (HCC) 09/14/2015  . Alcohol abuse   . Pleural effusion   . Acute respiratory failure with hypoxia (HCC)   . Altered mental status   . Acute respiratory failure (HCC) 08/23/2015  .  Enteritis due to Clostridium difficile 08/23/2015  . Rhabdomyolysis 08/23/2015  . Acute renal failure (HCC) 08/23/2015  . Sepsis (HCC) 08/21/2015  . Psychoses   . Psychosis 03/28/2015  . HTN (hypertension) 09/12/2011  . Aspiration pneumonia (HCC) 09/11/2011  . RUQ abdominal pain 09/11/2011  . SOB (shortness of breath) 09/11/2011  . Pneumonia 09/04/2011  . Fever 09/04/2011  . Acute pancreatitis 09/03/2011  . Abdominal pain 09/03/2011  . Acute encephalopathy 08/28/2011  . Hypernatremia 08/27/2011  . Poisoning by anticholinergic drug 08/23/2011  . Eczema 08/23/2011  . Papular rash, generalized 08/23/2011  . HTN (hypertension), malignant 08/23/2011  . Hyperglycemia 08/23/2011  . PTSD (post-traumatic stress disorder) 08/23/2011  . Cardiac arrest (HCC) 08/23/2011  . QT prolongation 08/23/2011    Past Surgical History:  Procedure Laterality Date  . INTUBATION  08/26/2015      . LEFT HEART CATHETERIZATION WITH CORONARY ANGIOGRAM N/A 08/24/2011   Procedure: LEFT HEART CATHETERIZATION WITH CORONARY ANGIOGRAM;  Surgeon: Corky Crafts, MD;  Location[facility] CATH LAB;  Service: Cardiovascular;  Laterality: N/A;  . TONSILLECTOMY    . UVULOPALATOPHARYNGOPLASTY, TONSILLECTOMY AND SEPTOPLASTY  06/16/2000    Prior to Admission medications   Medication Sig Start Date End Date Taking? Authorizing Provider  levETIRAcetam (KEPPRA) 500 MG tablet Take 1 tablet (500 mg total) by mouth 2 (two) times daily. 08/07/16   Governor Rooks, MD    Allergies  Allergen Reactions  . Antihistamines, Diphenhydramine-Type Anaphylaxis  and Rash  . Benadryl [Diphenhydramine Hcl] Anaphylaxis and Rash  . Hydroxyzine Anaphylaxis and Rash    Family History  Problem Relation Age of Onset  . Heart attack Mother     Social History Social History  Substance Use Topics  . Smoking status: Current Every Day Smoker    Types: Cigarettes  . Smokeless tobacco: Never Used  . Alcohol use Yes     Comment: heavy, has a  "drinking problem" per mother    Review of Systems  Constitutional: Negative for fever Or recent illness. Eyes: Negative for visual changes. ENT: Negative for sore throat. Cardiovascular: Negative for chest pain. Respiratory: Negative for shortness of breath. Gastrointestinal: Negative for abdominal pain, vomiting and diarrhea. Genitourinary: Negative for dysuria. Musculoskeletal: Negative for back pain. Skin: Negative for rash. Neurological: Negative for headache. 10 point Review of Systems otherwise negative ____________________________________________   PHYSICAL EXAM:  VITAL SIGNS: ED Triage Vitals [08/07/16 0753]  Enc Vitals Group     BP 130/86     Pulse Rate (!) 112     Resp 20     Temp 98 F (36.7 C)     Temp Source Oral     SpO2 97 %     Weight 220 lb (99.8 kg)     Height 5\' 11"  (1.803 m)     Head Circumference      Peak Flow      Pain Score      Pain Loc      Pain Edu?      Excl. in GC?      Constitutional: Alert and oriented. Well appearing and in no distress. HEENT   Head: Normocephalic and atraumatic.      Eyes: Conjunctivae are normal. PERRL. Normal extraocular movements.      Ears:         Nose: No congestion/rhinnorhea.   Mouth/Throat: Mucous membranes are moist.   Neck: No stridor. Cardiovascular/Chest: Normal rate, regular rhythm.  No murmurs, rubs, or gallops. Respiratory: Normal respiratory effort without tachypnea nor retractions. Breath sounds are clear and equal bilaterally. No wheezes/rales/rhonchi. Gastrointestinal: Soft. No distention, no guarding, no rebound. Nontender.   Genitourinary/rectal:Deferred Musculoskeletal: Nontender with normal range of motion in all extremities. No joint effusions.  No lower extremity tenderness.  No edema. Neurologic:  Normal speech and language. No gross or focal neurologic deficits are appreciated. Skin:  Skin is warm, dry and intact. No rash noted. Psychiatric: Mood and affect are normal.  Speech and behavior are normal. Patient exhibits appropriate insight and judgment.   ____________________________________________  LABS (pertinent positives/negatives)  Labs Reviewed  BASIC METABOLIC PANEL - Abnormal; Notable for the following:       Result Value   CO2 20 (*)    Glucose, Bld 128 (*)    Creatinine, Ser 1.28 (*)    All other components within normal limits  CBC WITH DIFFERENTIAL/PLATELET - Abnormal; Notable for the following:    RDW 17.3 (*)    Platelets 116 (*)    Lymphs Abs 0.9 (*)    All other components within normal limits  URINE DRUG SCREEN, QUALITATIVE ([facility] ONLY) - Abnormal; Notable for the following:    Cannabinoid 50 Ng, Ur [place] POSITIVE (*)    All other components within normal limits  URINALYSIS COMPLETEWITH MICROSCOPIC ([facility] ONLY) - Abnormal; Notable for the following:    Color, Urine YELLOW (*)    APPearance CLEAR (*)    Hgb urine dipstick 2+ (*)    Protein, ur  100 (*)    Bacteria, UA RARE (*)    Squamous Epithelial / LPF 0-5 (*)    All other components within normal limits    ____________________________________________    EKG I, Governor Rooksebecca Kalib Bhagat, MD, the attending physician have personally viewed and interpreted all ECGs.  None ____________________________________________  RADIOLOGY All Xrays were viewed by me. Imaging interpreted by Radiologist.  None __________________________________________  PROCEDURES  Procedure(s) performed: None  Critical Care performed: None  ____________________________________________   ED COURSE / ASSESSMENT AND PLAN  Pertinent labs & imaging results that were available during my care of the patient were reviewed by me and considered in my medical decision making (see chart for details).  Mr. Coralee RudWitherspoon's a chronically poor historian regarding his past medical history. Initially is under the impression that he may have had a seizure today due to alcohol withdrawal, but it sounds as if he was not  trying to quit alcohol, and his last drink was yesterday and so I think less likely this is alcohol withdrawal. He reports a history of seizure in the past and initially felt like that to him was consistent with alcohol withdrawal, but when I read the very long hospitalization report from last year, it appears that he was admitted due to sepsis with multiple complications due to that from multiorgan failure, and there was a question about seizure activity, but EEG and head CT had been negative and neurology consultations had not recommended antiepileptics.  In any case, it does show seizure listed on his past medical record, but as far as I can tell this is perhaps the only seizure that he is talking about and its unclear to me whether or not there was actually seizure at all.  I did go back and speak with the significant other who is actually a nurse, and she described the seizure activity as stiffness of the extremities, not alert, and deep heavy breathing followed by stillness which sounded like a postictal state. So by description it does sound like he did have a seizure today.  Electrolytes are reassuring. No focal neurologic deficits I don't think he needs a head CT today.  I spoke with on-call neurologist, Dr.Zeylickman, and followed recommendations to start Keppra and have patient follow up as an outpatient. [facility] and will call his primary doctor for an evaluation this week.   CONSULTATIONS:  Dr. Madalyn RobZeylickman, neurology by phone. We discussed the clinical history and the patient is back to baseline, and reviewed his laboratory studies. He does not recommend additional brain imaging at this point time. He does recommend starting on Keppra 500 mg twice daily and outpatient neurology evaluation.  Patient / Family / Caregiver informed of clinical course, medical decision-making process, and agree with plan.   I discussed return precautions, follow-up instructions, and discharge  instructions with patient and/or family.   ___________________________________________   FINAL CLINICAL IMPRESSION(S) / ED DIAGNOSES   Final diagnoses:  Seizure [facility](HCC)              Note: This dictation was prepared with Dragon dictation. Any transcriptional errors that result from this process are unintentional    Governor Rooksebecca Rakeya Glab, MD 08/07/16 1225

## 2016-08-07 NOTE — ED Notes (Signed)
Resting quietly at present   Family at bedside. No seizure activity noted since arrival

## 2016-08-07 NOTE — Discharge Instructions (Signed)
From Dr. Shaune PollackLord:  You were evaluated after seizure, and your exam and evaluation are reassuring in the emergency department today.  You're being started on antiseizure medication called Keppra. You do need to follow up with your doctor as soon as possible, this week, and need to be referred to see a neurologist. May need some additional studies including possibly an MRI of the brain and/or EEG testing.  Make sure getting plenty of sleep. Do not drive until you are evaluated and released with either your primary doctor or a neurologist. Do not use drugs as they can make it more likely for you to have a seizure.

## 2016-08-07 NOTE — ED Triage Notes (Signed)
Brought in via ems s/p seizure  Per ems wife found at home with sz like activity which last about 5 mins   EMS found pt post ictal  On arrival to ed alert   Denies any pain

## 2016-11-10 ENCOUNTER — Emergency Department: Payer: Non-veteran care

## 2016-11-10 ENCOUNTER — Encounter: Payer: Self-pay | Admitting: Emergency Medicine

## 2016-11-10 ENCOUNTER — Emergency Department
Admission: EM | Admit: 2016-11-10 | Discharge: 2016-11-10 | Disposition: A | Discharge status: Home / Self Care | Payer: Non-veteran care | Attending: Emergency Medicine | Admitting: Emergency Medicine

## 2016-11-10 DIAGNOSIS — I10 Essential (primary) hypertension: Secondary | ICD-10-CM | POA: Diagnosis not present

## 2016-11-10 DIAGNOSIS — F1010 Alcohol abuse, uncomplicated: Secondary | ICD-10-CM | POA: Diagnosis not present

## 2016-11-10 DIAGNOSIS — F17290 Nicotine dependence, other tobacco product, uncomplicated: Secondary | ICD-10-CM | POA: Diagnosis not present

## 2016-11-10 DIAGNOSIS — IMO0002 Reserved for concepts with insufficient information to code with codable children: Secondary | ICD-10-CM

## 2016-11-10 DIAGNOSIS — I252 Old myocardial infarction: Secondary | ICD-10-CM | POA: Diagnosis not present

## 2016-11-10 DIAGNOSIS — R1010 Upper abdominal pain, unspecified: Secondary | ICD-10-CM | POA: Diagnosis present

## 2016-11-10 DIAGNOSIS — R112 Nausea with vomiting, unspecified: Secondary | ICD-10-CM

## 2016-11-10 LAB — URINALYSIS, COMPLETE (UACMP) WITH MICROSCOPIC
Bacteria, UA: NONE SEEN
Bilirubin Urine: NEGATIVE
GLUCOSE, UA: NEGATIVE mg/dL
KETONES UR: 20 mg/dL — AB
Nitrite: NEGATIVE
Protein, ur: >=300 mg/dL — AB
Specific Gravity, Urine: 1.026 (ref 1.005–1.030)
pH: 5 (ref 5.0–8.0)

## 2016-11-10 LAB — CBC WITH DIFFERENTIAL/PLATELET
Basophils Absolute: 0 10*3/uL (ref 0–0.1)
Basophils Relative: 1 %
EOS ABS: 0 10*3/uL (ref 0–0.7)
Eosinophils Relative: 0 %
HEMATOCRIT: 41.5 % (ref 40.0–52.0)
HEMOGLOBIN: 13.8 g/dL (ref 13.0–18.0)
Lymphocytes Relative: 35 %
Lymphs Abs: 1.2 10*3/uL (ref 1.0–3.6)
MCH: 29.6 pg (ref 26.0–34.0)
MCHC: 33.2 g/dL (ref 32.0–36.0)
MCV: 89 fL (ref 80.0–100.0)
MONOS PCT: 11 %
Monocytes Absolute: 0.4 10*3/uL (ref 0.2–1.0)
NEUTROS PCT: 53 %
Neutro Abs: 1.8 10*3/uL (ref 1.4–6.5)
Platelets: 106 10*3/uL — ABNORMAL LOW (ref 150–440)
RBC: 4.66 MIL/uL (ref 4.40–5.90)
RDW: 15.4 % — ABNORMAL HIGH (ref 11.5–14.5)
WBC: 3.3 10*3/uL — AB (ref 3.8–10.6)

## 2016-11-10 LAB — COMPREHENSIVE METABOLIC PANEL
ALK PHOS: 85 U/L (ref 38–126)
ALT: 66 U/L — AB (ref 17–63)
ANION GAP: 15 (ref 5–15)
AST: 112 U/L — ABNORMAL HIGH (ref 15–41)
Albumin: 4.2 g/dL (ref 3.5–5.0)
BILIRUBIN TOTAL: 1.1 mg/dL (ref 0.3–1.2)
BUN: 8 mg/dL (ref 6–20)
CALCIUM: 9.3 mg/dL (ref 8.9–10.3)
CO2: 24 mmol/L (ref 22–32)
CREATININE: 1.12 mg/dL (ref 0.61–1.24)
Chloride: 100 mmol/L — ABNORMAL LOW (ref 101–111)
Glucose, Bld: 84 mg/dL (ref 65–99)
Potassium: 3.9 mmol/L (ref 3.5–5.1)
SODIUM: 139 mmol/L (ref 135–145)
TOTAL PROTEIN: 7.9 g/dL (ref 6.5–8.1)

## 2016-11-10 LAB — ETHANOL: ALCOHOL ETHYL (B): 74 mg/dL — AB (ref <5)

## 2016-11-10 LAB — LIPASE, BLOOD: LIPASE: 22 U/L (ref 11–51)

## 2016-11-10 MED ORDER — LORAZEPAM 2 MG/ML IJ SOLN
1.0000 mg | Freq: Once | INTRAMUSCULAR | Status: AC
  Administered 2016-11-10: 1 mg via INTRAVENOUS
  Filled 2016-11-10: qty 1

## 2016-11-10 MED ORDER — IOPAMIDOL (ISOVUE-300) INJECTION 61%
100.0000 mL | Freq: Once | INTRAVENOUS | Status: AC | PRN
  Administered 2016-11-10: 100 mL via INTRAVENOUS

## 2016-11-10 MED ORDER — FAMOTIDINE 20 MG PO TABS: 20.0000 mg | ORAL_TABLET | Freq: Two times a day (BID) | ORAL | 1 refills | Status: DC

## 2016-11-10 MED ORDER — SODIUM CHLORIDE 0.9 % IV SOLN
Freq: Once | INTRAVENOUS | Status: AC
  Administered 2016-11-10: 10:00:00 via INTRAVENOUS

## 2016-11-10 MED ORDER — ONDANSETRON HCL 4 MG/2ML IJ SOLN
4.0000 mg | Freq: Once | INTRAMUSCULAR | Status: AC
  Administered 2016-11-10: 4 mg via INTRAVENOUS

## 2016-11-10 MED ORDER — ONDANSETRON HCL 4 MG/2ML IJ SOLN
4.0000 mg | Freq: Once | INTRAMUSCULAR | Status: AC
  Administered 2016-11-10: 4 mg via INTRAVENOUS
  Filled 2016-11-10: qty 2

## 2016-11-10 MED ORDER — ONDANSETRON HCL 4 MG/2ML IJ SOLN
INTRAMUSCULAR | Status: AC
  Administered 2016-11-10: 4 mg via INTRAVENOUS
  Filled 2016-11-10: qty 2

## 2016-11-10 MED ORDER — SODIUM CHLORIDE 0.9 % IV SOLN
Freq: Once | INTRAVENOUS | Status: AC
  Administered 2016-11-10: 09:00:00 via INTRAVENOUS

## 2016-11-10 MED ORDER — MORPHINE SULFATE (PF) 4 MG/ML IV SOLN
4.0000 mg | Freq: Once | INTRAVENOUS | Status: AC
  Administered 2016-11-10: 4 mg via INTRAVENOUS
  Filled 2016-11-10: qty 1

## 2016-11-10 MED ORDER — DIATRIZOATE MEGLUMINE & SODIUM 66-10 % PO SOLN: 30.0000 mL | Freq: Once | ORAL | Status: DC

## 2016-11-10 MED ORDER — LORAZEPAM 2 MG PO TABS
2.0000 mg | ORAL_TABLET | Freq: Once | ORAL | Status: AC
  Administered 2016-11-10: 2 mg via ORAL
  Filled 2016-11-10: qty 1

## 2016-11-10 MED ORDER — ONDANSETRON 4 MG PO TBDP: 4.0000 mg | ORAL_TABLET | Freq: Three times a day (TID) | ORAL | 0 refills | Status: DC | PRN

## 2016-11-10 NOTE — ED Provider Notes (Addendum)
[facility] Emergency Department Provider Note        Time seen: ----------------------------------------- 8:13 AM on 11/10/2016 -----------------------------------------    I have reviewed the triage vital signs and the nursing notes.   HISTORY  Chief Complaint Emesis    HPI Ross [name] is a 51 y.o. male presents to the ER for intermittent vomiting. Patient has had vomiting that's been bilious according to family. Patient reports he drinks 30 ounces of alcohol a day. He does report drinking some this morning, has upper abdominal pain associated with the vomiting. This also happened about a month ago. Patient states she's been to detox before. He denies fevers, chills or other complaints.   Past Medical History:  Diagnosis Date  . Acute renal failure (HCC) 08/23/2015  . Anxiety   . Cardiac arrest (HCC) 08/23/2011  . Hypertension   . Mental disorder    PTSD  . Myocardial infarction   . Pancreatitis   . Pneumonia 09/04/2011  . PTSD (post-traumatic stress disorder)   . Rhabdomyolysis 08/23/2015  . Schizophrenia (HCC)   . Seizures (HCC)    currently dx with seizures  . Sleep apnea    had surgery to correct it  . SOB (shortness of breath) 09/11/2011    Patient Active Problem List   Diagnosis Date Noted  . Paranoid schizophrenia (HCC) 09/14/2015  . Alcohol abuse   . Pleural effusion   . Acute respiratory failure with hypoxia (HCC)   . Altered mental status   . Acute respiratory failure (HCC) 08/23/2015  . Enteritis due to Clostridium difficile 08/23/2015  . Rhabdomyolysis 08/23/2015  . Acute renal failure (HCC) 08/23/2015  . Sepsis (HCC) 08/21/2015  . Psychoses   . Psychosis 03/28/2015  . HTN (hypertension) 09/12/2011  . Aspiration pneumonia (HCC) 09/11/2011  . RUQ abdominal pain 09/11/2011  . SOB (shortness of breath) 09/11/2011  . Pneumonia 09/04/2011  . Fever 09/04/2011  . Acute pancreatitis 09/03/2011  . Abdominal pain  09/03/2011  . Acute encephalopathy 08/28/2011  . Hypernatremia 08/27/2011  . Poisoning by anticholinergic drug 08/23/2011  . Eczema 08/23/2011  . Papular rash, generalized 08/23/2011  . HTN (hypertension), malignant 08/23/2011  . Hyperglycemia 08/23/2011  . PTSD (post-traumatic stress disorder) 08/23/2011  . Cardiac arrest (HCC) 08/23/2011  . QT prolongation 08/23/2011    Past Surgical History:  Procedure Laterality Date  . INTUBATION  08/26/2015      . LEFT HEART CATHETERIZATION WITH CORONARY ANGIOGRAM N/A 08/24/2011   Procedure: LEFT HEART CATHETERIZATION WITH CORONARY ANGIOGRAM;  Surgeon: Corky CraftsJayadeep S Varanasi, MD;  [facility];  Service: Cardiovascular;  Laterality: N/A;  . TONSILLECTOMY    . UVULOPALATOPHARYNGOPLASTY, TONSILLECTOMY AND SEPTOPLASTY  06/16/2000    Allergies Antihistamines, diphenhydramine-type; Benadryl [diphenhydramine hcl]; and Hydroxyzine  Social History Social History  Substance Use Topics  . Smoking status: Current Every Day Smoker    Types: Cigars  . Smokeless tobacco: Never Used  . Alcohol use Yes     Comment: 70 oz a week    Review of Systems Constitutional: Negative for fever. Cardiovascular: Negative for chest pain. Respiratory: Negative for shortness of breath. Gastrointestinal: Positive for abdominal pain, vomiting Genitourinary: Negative for dysuria. Musculoskeletal: Negative for back pain. Skin: Negative for rash. Neurological: Negative for headaches, focal weakness or numbness.  10-point ROS otherwise negative.  ____________________________________________   PHYSICAL EXAM:  VITAL SIGNS: ED Triage Vitals  Enc Vitals Group     BP 11/10/16 0736 (!) 139/93     Pulse Rate 11/10/16  0736 73     Resp 11/10/16 0736 17     Temp 11/10/16 0736 97.6 F (36.4 C)     Temp Source 11/10/16 0736 Oral     SpO2 11/10/16 0736 98 %     Weight 11/10/16 0737 218 lb (98.9 kg)     Height 11/10/16 0737 5\' 10"  (1.778 m)     Head  Circumference --      Peak Flow --      Pain Score --      Pain Loc --      Pain Edu? --      Excl. in GC? --     Constitutional: Alert and oriented. Mild distress Eyes: Conjunctivae are normal. PERRL. Normal extraocular movements. ENT   Head: Normocephalic and atraumatic.   Nose: No congestion/rhinnorhea.   Mouth/Throat: Mucous membranes are moist.   Neck: No stridor. Cardiovascular: Normal rate, regular rhythm. No murmurs, rubs, or gallops. Respiratory: Normal respiratory effort without tachypnea nor retractions. Breath sounds are clear and equal bilaterally. No wheezes/rales/rhonchi. Gastrointestinal: Upper abdominal tenderness, no rebound or guarding. Normal bowel sounds. Musculoskeletal: Nontender with normal range of motion in all extremities. No lower extremity tenderness nor edema. Neurologic:  Normal speech and language. No gross focal neurologic deficits are appreciated.  Skin:  Skin is warm, dry and intact. No rash noted. Psychiatric: Mood and affect are normal. Speech and behavior are normal.  ___________________________________________  ED COURSE:  Pertinent labs & imaging results that were available during my care of the patient were reviewed by me and considered in my medical decision making (see chart for details). Patient presents to the ER with vomiting likely secondary to alcohol use disorder. He'll receive IV fluids, will check basic labs and give antiemetics.   Procedures ____________________________________________   LABS (pertinent positives/negatives)  Labs Reviewed  CBC WITH DIFFERENTIAL/PLATELET - Abnormal; Notable for the following:       Result Value   WBC 3.3 (*)    RDW 15.4 (*)    Platelets 106 (*)    All other components within normal limits  COMPREHENSIVE METABOLIC PANEL - Abnormal; Notable for the following:    Chloride 100 (*)    AST 112 (*)    ALT 66 (*)    All other components within normal limits  URINALYSIS, COMPLETE  (UACMP) WITH MICROSCOPIC - Abnormal; Notable for the following:    Color, Urine YELLOW (*)    APPearance CLEAR (*)    Specific Gravity, Urine >1.046 (*)    Squamous Epithelial / LPF 0-5 (*)    All other components within normal limits  ETHANOL - Abnormal; Notable for the following:    Alcohol, Ethyl (B) 74 (*)    All other components within normal limits  URINALYSIS, COMPLETE (UACMP) WITH MICROSCOPIC - Abnormal; Notable for the following:    Color, Urine AMBER (*)    APPearance CLEAR (*)    Hgb urine dipstick MODERATE (*)    Ketones, ur 20 (*)    Protein, ur >=300 (*)    Leukocytes, UA TRACE (*)    Squamous Epithelial / LPF 0-5 (*)    All other components within normal limits  LIPASE, BLOOD  CT of the abdomen and pelvis with contrast IMPRESSION: Chronic changes without acute abnormality.  ____________________________________________  FINAL ASSESSMENT AND PLAN  Vomiting, alcohol use disorder  Plan: Patient with labs and imaging as dictated above. Patient has declined alcohol detox at this time. Unclear etiology for his emesis. He does appear dehydrated  and he was given IV fluids. He has consistently denied alcohol detox and states he is not ready to quit drinking. I did give him a dose of Ativan here as he began to be tremulous. I will prescribe antiemetics and advise close outpatient follow-up.   Emily Filbert, MD   Note: This note was generated in part or whole with voice recognition software. Voice recognition is usually quite accurate but there are transcription errors that can and very often do occur. I apologize for any typographical errors that were not detected and corrected.     Emily Filbert, MD 11/10/16 1105    Emily Filbert, MD 11/10/16 [phone]

## 2016-11-10 NOTE — ED Notes (Signed)
Pt reports large amount of green emesis. edp notified.

## 2016-11-10 NOTE — ED Triage Notes (Signed)
Patient presents to ED via POV from home with c/o emesis on and off x 1 month. Patient denies any GI history. Ambulatory to room. Patient denies abdominal pain. Patient c/o chills.

## 2016-11-10 NOTE — ED Notes (Signed)
Pt aware that we need to obtain a urine specimen for further eval. NAD.

## 2016-11-10 NOTE — ED Notes (Signed)
Patient denies pain and is resting comfortably.  

## 2017-10-26 IMAGING — CR DG CHEST 1V
1 series · 1 of 1 positions shown · non-contrast
Comparison: 09/06/2015.

CLINICAL DATA: Intubation.

EXAM:
CHEST 1 VIEW

[portable]
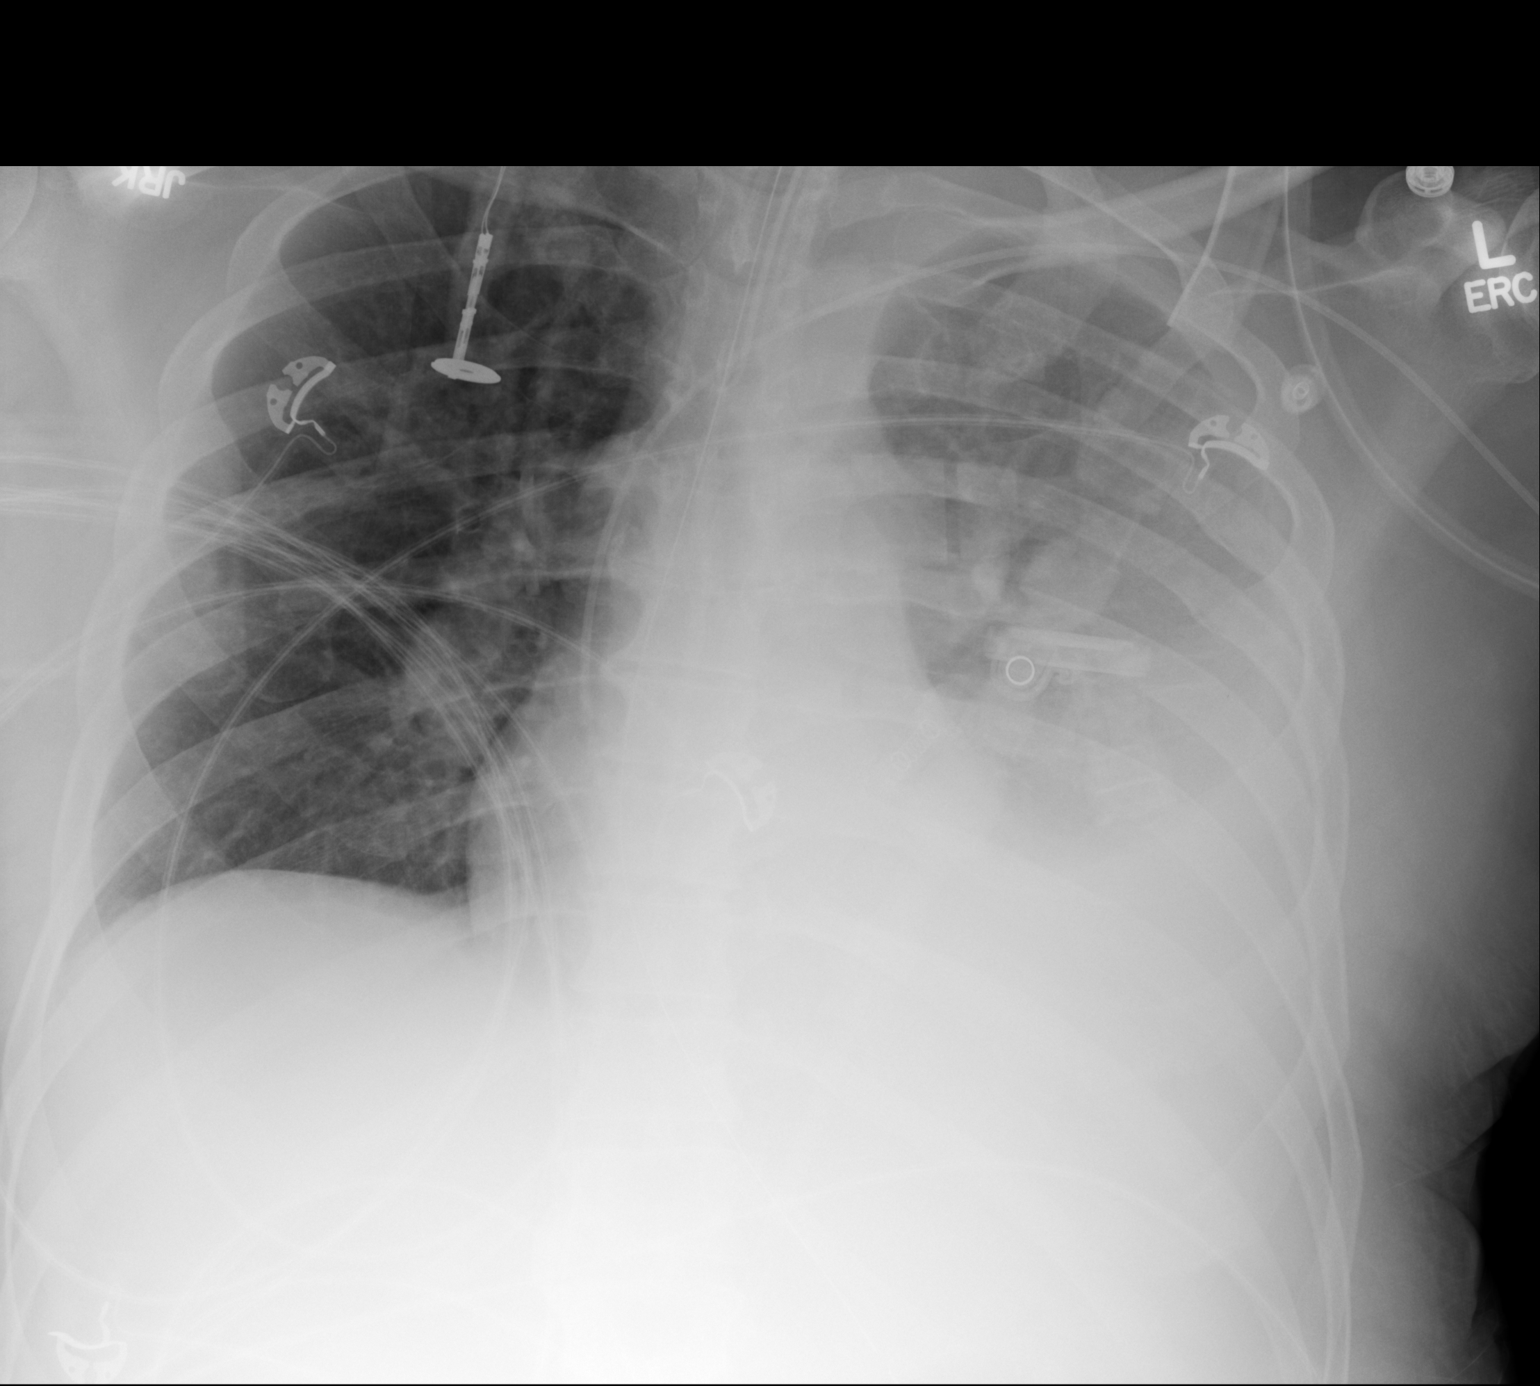

[1 of 1 positions shown; findings below may reference images not displayed]

FINDINGS: Endotracheal tube, NG tube, left PICC line in stable position. Heart
size stable. Persistent consolidation of the left lung consistent
with atelectasis and/or pneumonia. Persistent left pleural effusion.
No interim change.
IMPRESSION: 1. Lines and tubes in stable position.
2. Persistent consolidation of the left lung consistent atelectasis
and/or pneumonia. Persistent left pleural effusion. No interim
change .

## 2017-10-26 IMAGING — CR DG CHEST 1V PORT
1 series · 1 of 1 positions shown · non-contrast
Comparison: Same day.

CLINICAL DATA: Chest tube placement.

EXAM:
PORTABLE CHEST 1 VIEW

[ap]
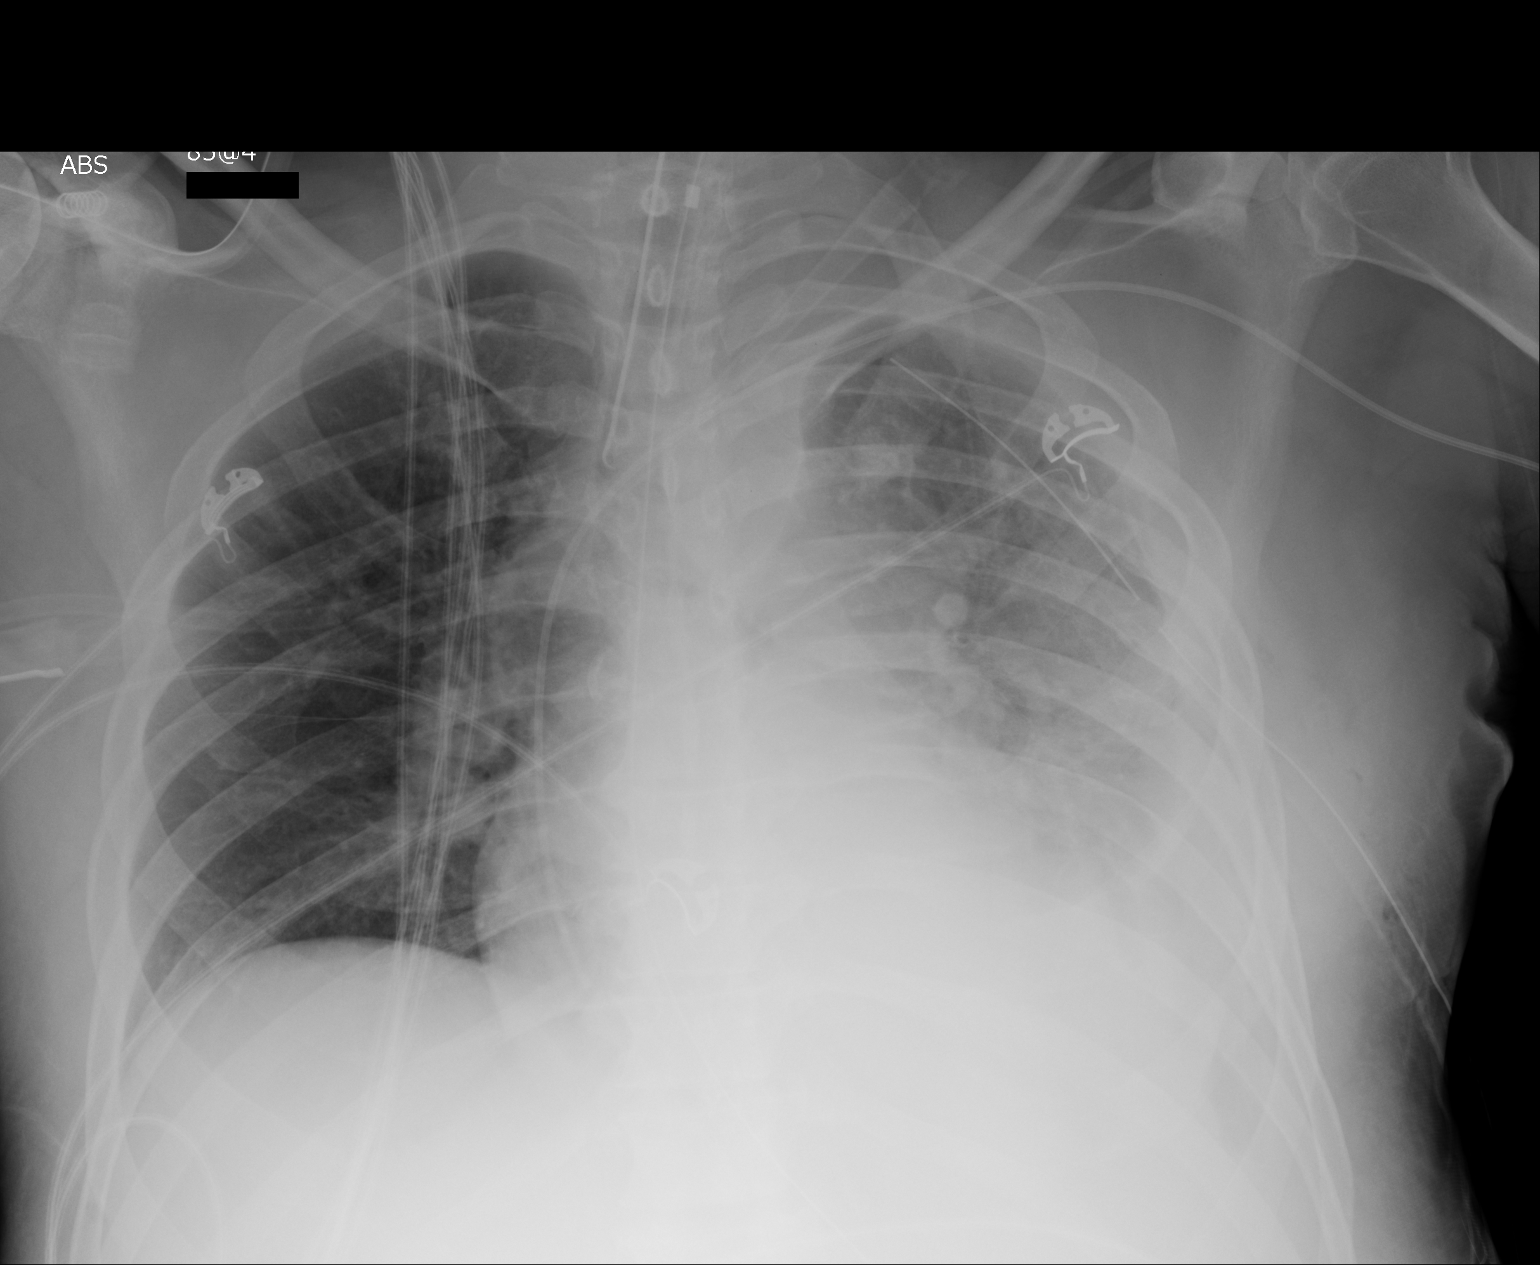

[1 of 1 positions shown; findings below may reference images not displayed]

FINDINGS: Stable cardiomediastinal silhouette. Endotracheal and nasogastric
tubes are unchanged in position. Left-sided PICC line is unchanged
in position with distal tip in expected position of right atrium. No
pneumothorax is noted. Right lung is clear. Interval placement of
left-sided chest tube with tip in left lung apex. Stable moderate
left pleural effusion is noted with probable left lower lobe
atelectasis. Bony thorax is unremarkable.
IMPRESSION: Interval placement of left-sided chest tube with continued presence
of moderate left pleural effusion with probable left lower lobe
atelectasis. Stable support apparatus. Left-sided PICC line tip is
again noted in right atrium which is unchanged compared to prior
exam ; withdrawal by 3-4 cm is recommended. These results will be
called to the ordering clinician or representative by the
Radiologist Assistant, and communication documented in the PACS or
[employer] Dashboard.

## 2017-10-26 IMAGING — CT CT CHEST W/O CM
2 of 3 series · 17 of 46 positions shown, 19 images · non-contrast
Comparison: None.

CLINICAL DATA: Pleural effusion

EXAM:
CT CHEST WITHOUT CONTRAST
TECHNIQUE: Multidetector CT imaging of the chest was performed following the
standard protocol without IV contrast.

[Series 2: routine chest wo · axial · 0.80mm/px · z∈[-972,-702]mm · 14 of 63 slices shown, 16 images]
[im 5/63  soft-tissue]
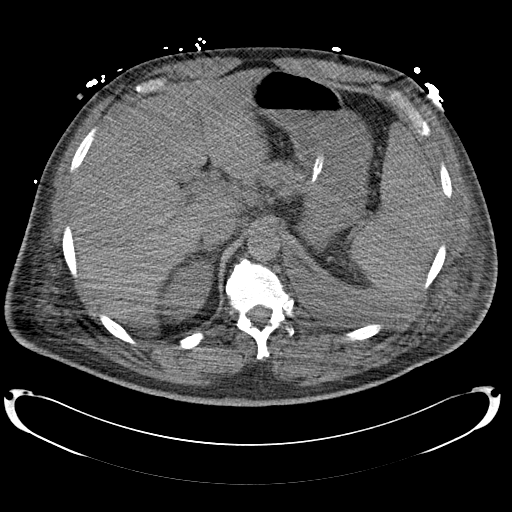
[im 5/63  bone]
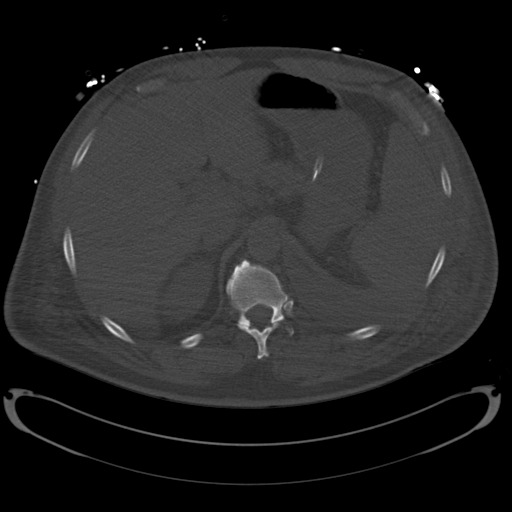
[im 9/63  soft-tissue]
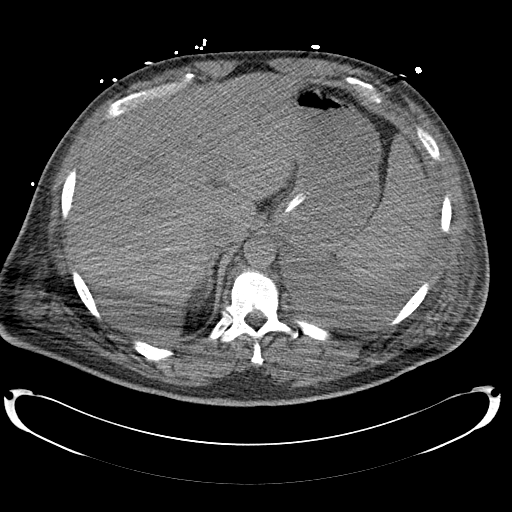
[im 13/63  soft-tissue]
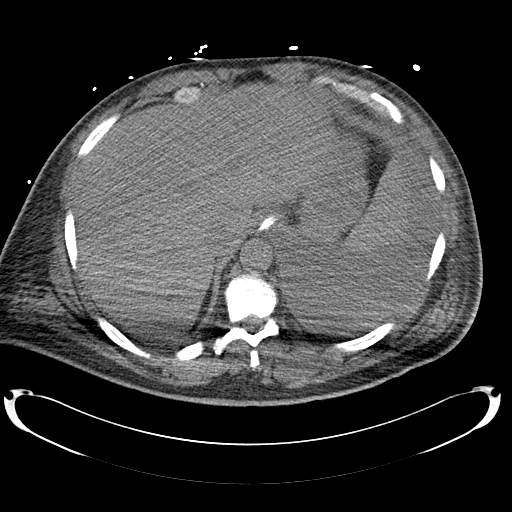
[im 17/63  soft-tissue]
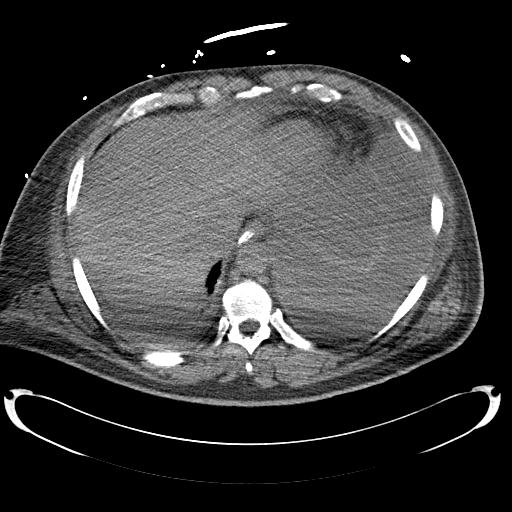
[im 21/63  soft-tissue]
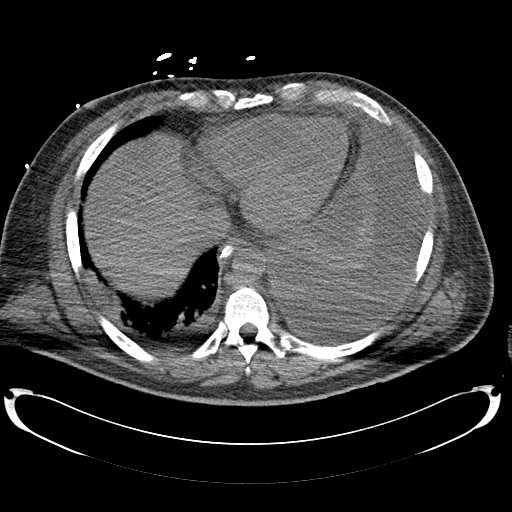
[im 25/63  soft-tissue]
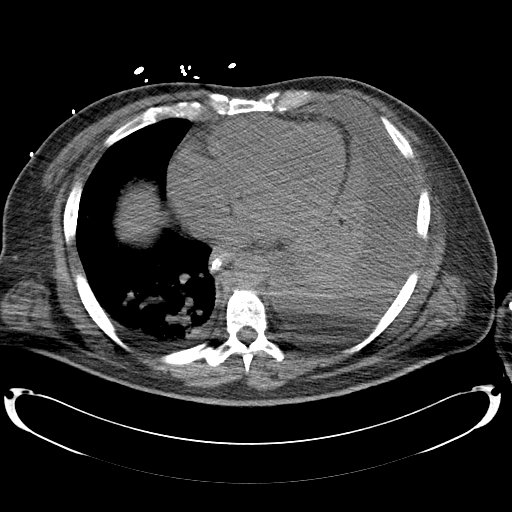
[im 29/63  soft-tissue]
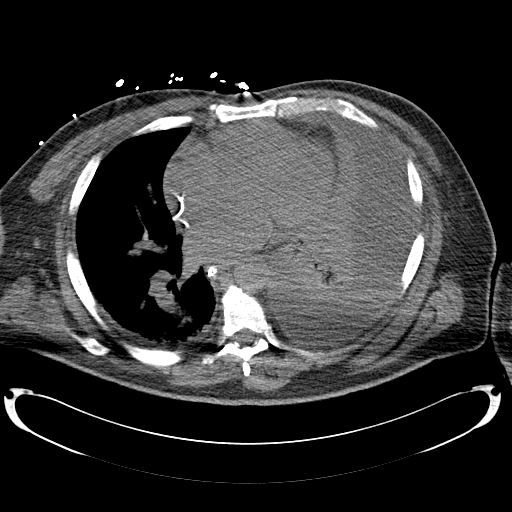
[im 35/63  soft-tissue]
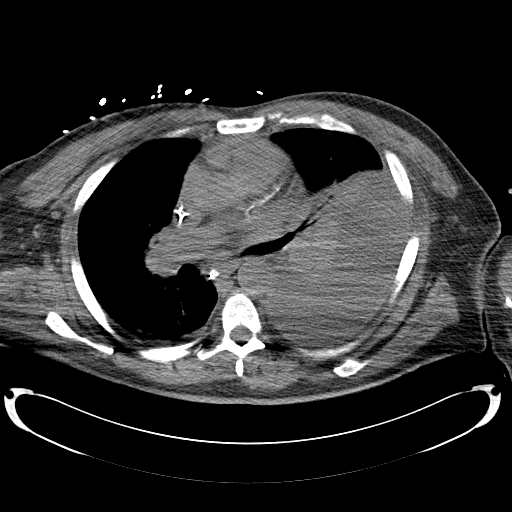
[im 39/63  soft-tissue]
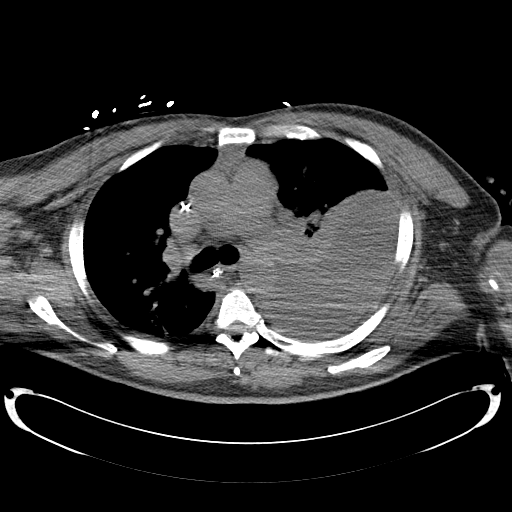
[im 39/63  bone]
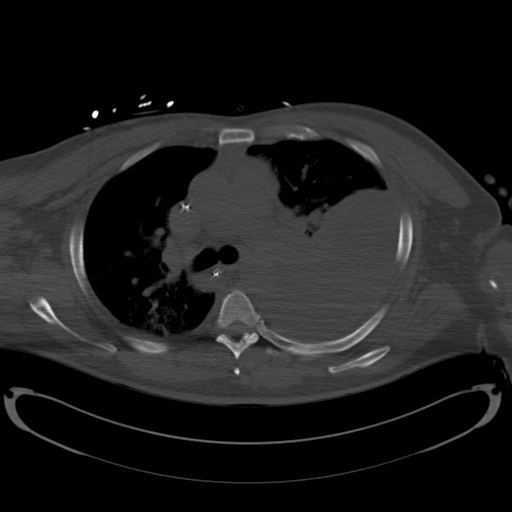
[im 43/63  soft-tissue]
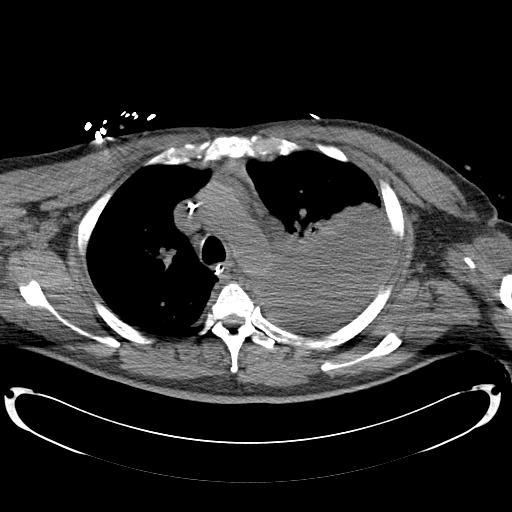
[im 47/63  soft-tissue]
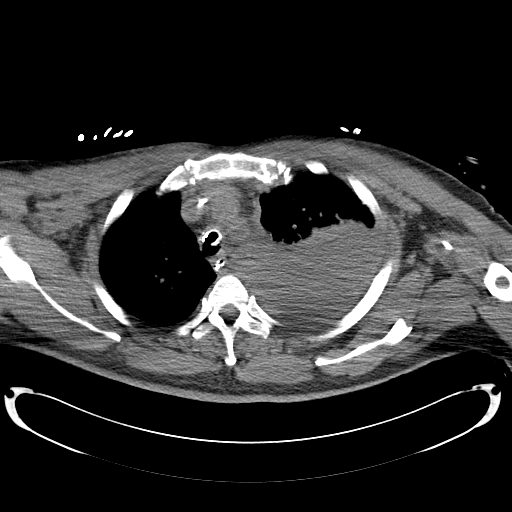
[im 51/63  soft-tissue]
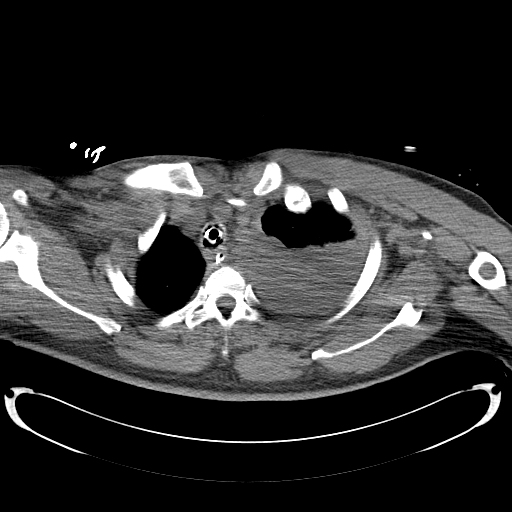
[im 55/63  soft-tissue]
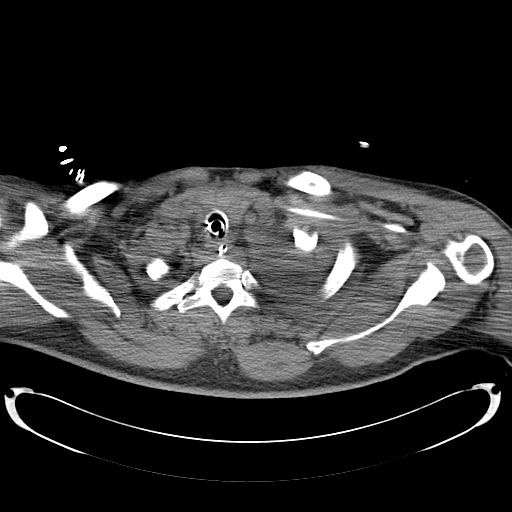
[im 59/63  soft-tissue]
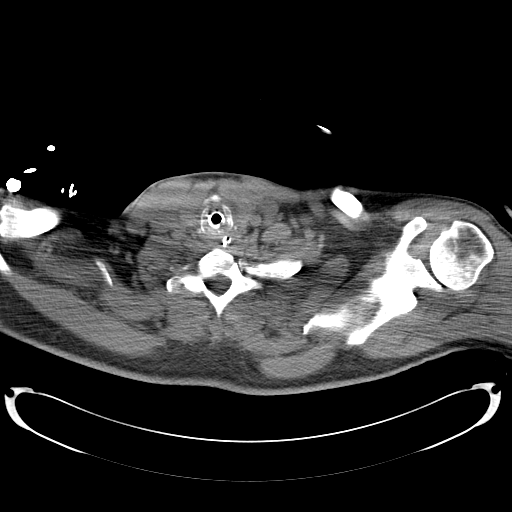

[Series 5: routine chest wo cor · coronal · 0.62mm/px · 3 of 138 slices shown]
[im 46/138  soft-tissue]
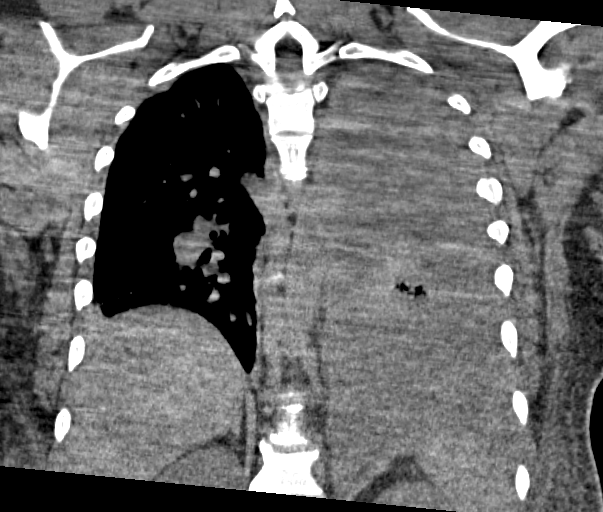
[im 61/138  soft-tissue]
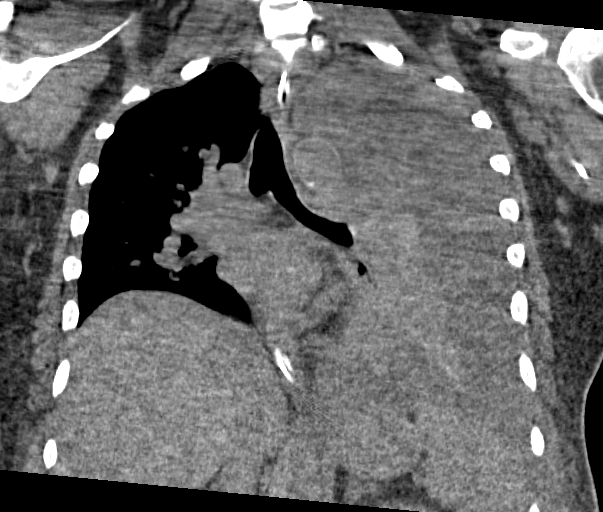
[im 77/138  soft-tissue]
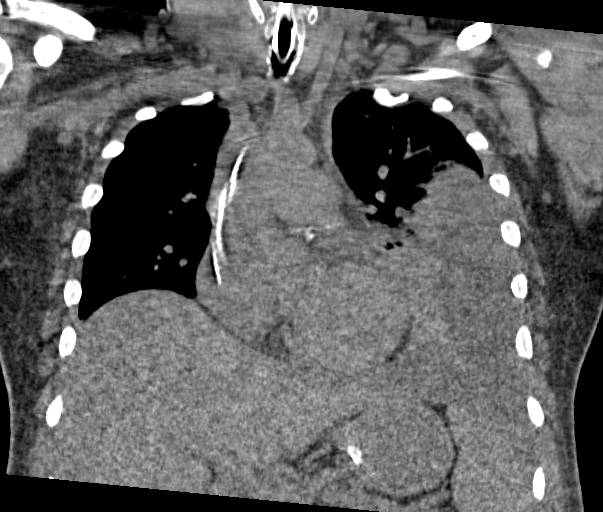

[17 of 46 positions shown; findings below may reference images not displayed]

FINDINGS: Large left pleural effusion is associated with compressive
atelectasis of a large portion of the left lung. The left lower lobe
mainstem bronchus has an abnormal appearance with narrowing and
possible compression. Endobronchial lesion is not excluded.

Tiny right pleural effusion and minimal right basilar atelectasis.

Endotracheal tube and NG tube are in place.

Left upper extremity PICC in place with its tip at the cavoatrial
junction.

Minimal coronary artery calcifications in the LAD territory.

No abnormal mediastinal adenopathy.  No pericardial effusion.
IMPRESSION: Large left pleural effusion is associated with compressive
atelectasis of much of the left lung. Endobronchial lesion in the
left lower lobe bronchus is not excluded. Bronchoscopy may be
helpful.

Tiny right pleural effusion and minimal right basilar atelectasis.

## 2017-10-31 ENCOUNTER — Encounter: Payer: No Typology Code available for payment source | Admitting: Podiatry

## 2017-11-07 NOTE — Progress Notes (Signed)
This encounter was created in error - please disregard.

## 2017-11-25 ENCOUNTER — Emergency Department: Payer: Federal, State, Local not specified - PPO

## 2017-11-25 ENCOUNTER — Other Ambulatory Visit: Payer: Self-pay

## 2017-11-25 ENCOUNTER — Emergency Department
Admission: EM | Admit: 2017-11-25 | Discharge: 2017-11-25 | Disposition: A | Discharge status: Other Acute Inpatient Hospital | Payer: Federal, State, Local not specified - PPO | Attending: Emergency Medicine | Admitting: Emergency Medicine

## 2017-11-25 ENCOUNTER — Encounter: Payer: Self-pay | Admitting: Emergency Medicine

## 2017-11-25 DIAGNOSIS — F17290 Nicotine dependence, other tobacco product, uncomplicated: Secondary | ICD-10-CM | POA: Insufficient documentation

## 2017-11-25 DIAGNOSIS — I10 Essential (primary) hypertension: Secondary | ICD-10-CM | POA: Insufficient documentation

## 2017-11-25 DIAGNOSIS — R4182 Altered mental status, unspecified: Secondary | ICD-10-CM | POA: Diagnosis not present

## 2017-11-25 DIAGNOSIS — G40901 Epilepsy, unspecified, not intractable, with status epilepticus: Secondary | ICD-10-CM | POA: Insufficient documentation

## 2017-11-25 DIAGNOSIS — I252 Old myocardial infarction: Secondary | ICD-10-CM | POA: Diagnosis not present

## 2017-11-25 DIAGNOSIS — F4310 Post-traumatic stress disorder, unspecified: Secondary | ICD-10-CM | POA: Insufficient documentation

## 2017-11-25 DIAGNOSIS — R7989 Other specified abnormal findings of blood chemistry: Secondary | ICD-10-CM | POA: Insufficient documentation

## 2017-11-25 DIAGNOSIS — Z79899 Other long term (current) drug therapy: Secondary | ICD-10-CM | POA: Insufficient documentation

## 2017-11-25 DIAGNOSIS — R569 Unspecified convulsions: Secondary | ICD-10-CM | POA: Diagnosis present

## 2017-11-25 LAB — CBC WITH DIFFERENTIAL/PLATELET
BASOS PCT: 1 %
Basophils Absolute: 0.1 10*3/uL (ref 0–0.1)
EOS ABS: 0.1 10*3/uL (ref 0–0.7)
EOS PCT: 1 %
HCT: 40.1 % (ref 40.0–52.0)
Hemoglobin: 13.1 g/dL (ref 13.0–18.0)
LYMPHS ABS: 1.9 10*3/uL (ref 1.0–3.6)
Lymphocytes Relative: 32 %
MCH: 25.5 pg — AB (ref 26.0–34.0)
MCHC: 32.6 g/dL (ref 32.0–36.0)
MCV: 78.2 fL — ABNORMAL LOW (ref 80.0–100.0)
Monocytes Absolute: 0.6 10*3/uL (ref 0.2–1.0)
Monocytes Relative: 11 %
NEUTROS PCT: 55 %
Neutro Abs: 3.3 10*3/uL (ref 1.4–6.5)
PLATELETS: 120 10*3/uL — AB (ref 150–440)
RBC: 5.12 MIL/uL (ref 4.40–5.90)
RDW: 16.4 % — ABNORMAL HIGH (ref 11.5–14.5)
WBC: 5.9 10*3/uL (ref 3.8–10.6)

## 2017-11-25 LAB — URINE DRUG SCREEN, QUALITATIVE (ARMC ONLY)
AMPHETAMINES, UR SCREEN: NOT DETECTED
BENZODIAZEPINE, UR SCRN: POSITIVE — AB
Barbiturates, Ur Screen: NOT DETECTED
Cannabinoid 50 Ng, Ur ~~LOC~~: POSITIVE — AB
Cocaine Metabolite,Ur ~~LOC~~: NOT DETECTED
MDMA (ECSTASY) UR SCREEN: NOT DETECTED
METHADONE SCREEN, URINE: NOT DETECTED
Opiate, Ur Screen: NOT DETECTED
PHENCYCLIDINE (PCP) UR S: NOT DETECTED
TRICYCLIC, UR SCREEN: NOT DETECTED

## 2017-11-25 LAB — URINALYSIS, ROUTINE W REFLEX MICROSCOPIC
Bilirubin Urine: NEGATIVE
GLUCOSE, UA: NEGATIVE mg/dL
HGB URINE DIPSTICK: NEGATIVE
Ketones, ur: NEGATIVE mg/dL
Leukocytes, UA: NEGATIVE
NITRITE: NEGATIVE
PH: 5 (ref 5.0–8.0)
Protein, ur: 30 mg/dL — AB
Specific Gravity, Urine: 1.021 (ref 1.005–1.030)

## 2017-11-25 LAB — LACTIC ACID, PLASMA
LACTIC ACID, VENOUS: 7.3 mmol/L — AB (ref 0.5–1.9)
Lactic Acid, Venous: 1.2 mmol/L (ref 0.5–1.9)

## 2017-11-25 LAB — COMPREHENSIVE METABOLIC PANEL
ALT: 13 U/L — ABNORMAL LOW (ref 17–63)
AST: 49 U/L — AB (ref 15–41)
Albumin: 4.1 g/dL (ref 3.5–5.0)
Alkaline Phosphatase: 70 U/L (ref 38–126)
Anion gap: 17 — ABNORMAL HIGH (ref 5–15)
BILIRUBIN TOTAL: 0.5 mg/dL (ref 0.3–1.2)
BUN: 16 mg/dL (ref 6–20)
CO2: 16 mmol/L — ABNORMAL LOW (ref 22–32)
CREATININE: 1.22 mg/dL (ref 0.61–1.24)
Calcium: 9 mg/dL (ref 8.9–10.3)
Chloride: 105 mmol/L (ref 101–111)
GFR calc Af Amer: >60 mL/min (ref >60)
Glucose, Bld: 107 mg/dL — ABNORMAL HIGH (ref 65–99)
Potassium: 3.3 mmol/L — ABNORMAL LOW (ref 3.5–5.1)
Sodium: 138 mmol/L (ref 135–145)
Total Protein: 7.4 g/dL (ref 6.5–8.1)

## 2017-11-25 LAB — ACETAMINOPHEN LEVEL: Acetaminophen (Tylenol), Serum: <10 ug/mL — ABNORMAL LOW (ref 10–30)

## 2017-11-25 LAB — MAGNESIUM: Magnesium: 2 mg/dL (ref 1.7–2.4)

## 2017-11-25 LAB — BLOOD GAS, ARTERIAL
ACID-BASE DEFICIT: 3.3 mmol/L — AB (ref 0.0–2.0)
BICARBONATE: 22.1 mmol/L (ref 20.0–28.0)
FIO2: 40
MECHVT: 500 mL
Mechanical Rate: 18
O2 SAT: 99.4 %
PCO2 ART: 40 mmHg (ref 32.0–48.0)
PEEP: 5 cmH2O
PO2 ART: 168 mmHg — AB (ref 83.0–108.0)
Patient temperature: 37
pH, Arterial: 7.35 (ref 7.350–7.450)

## 2017-11-25 LAB — TROPONIN I: Troponin I: <0.03 ng/mL (ref <0.03)

## 2017-11-25 LAB — GLUCOSE, CAPILLARY: Glucose-Capillary: 83 mg/dL (ref 65–99)

## 2017-11-25 LAB — ETHANOL: Alcohol, Ethyl (B): <10 mg/dL (ref <10)

## 2017-11-25 LAB — LIPASE, BLOOD: LIPASE: 25 U/L (ref 11–51)

## 2017-11-25 LAB — SALICYLATE LEVEL

## 2017-11-25 MED ORDER — THIAMINE HCL 100 MG/ML IJ SOLN
100.0000 mg | Freq: Once | INTRAMUSCULAR | Status: AC
  Administered 2017-11-25: 100 mg via INTRAVENOUS
  Filled 2017-11-25: qty 2

## 2017-11-25 MED ORDER — SODIUM CHLORIDE 0.9 % IV SOLN
1000.0000 mg | Freq: Once | INTRAVENOUS | Status: AC
  Administered 2017-11-25: 1000 mg via INTRAVENOUS
  Filled 2017-11-25: qty 10

## 2017-11-25 MED ORDER — ROCURONIUM BROMIDE 50 MG/5ML IV SOLN
100.0000 mg | Freq: Once | INTRAVENOUS | Status: AC
  Administered 2017-11-25: 100 mg via INTRAVENOUS

## 2017-11-25 MED ORDER — MIDAZOLAM HCL 5 MG/5ML IJ SOLN
2.0000 mg | Freq: Once | INTRAMUSCULAR | Status: AC
Start: 2017-11-25 — End: 2017-11-25
  Administered 2017-11-25: 2 mg via INTRAVENOUS

## 2017-11-25 MED ORDER — MIDAZOLAM HCL 5 MG/5ML IJ SOLN
4.0000 mg | INTRAMUSCULAR | Status: DC
  Administered 2017-11-25: 4 mg via INTRAVENOUS

## 2017-11-25 MED ORDER — FENTANYL CITRATE (PF) 100 MCG/2ML IJ SOLN
100.0000 ug | Freq: Once | INTRAMUSCULAR | Status: AC
  Administered 2017-11-25: 100 ug via INTRAVENOUS

## 2017-11-25 MED ORDER — SODIUM CHLORIDE 0.9 % IV SOLN: 150.0000 ug/h | INTRAVENOUS | Status: DC

## 2017-11-25 MED ORDER — SODIUM CHLORIDE 0.9 % IV SOLN
20.0000 mg/kg | Freq: Once | INTRAVENOUS | Status: AC
  Administered 2017-11-25: 1590 mg via INTRAVENOUS
  Filled 2017-11-25: qty 31.8

## 2017-11-25 MED ORDER — ETOMIDATE 2 MG/ML IV SOLN
20.0000 mg | Freq: Once | INTRAVENOUS | Status: AC
  Administered 2017-11-25: 20 mg via INTRAVENOUS

## 2017-11-25 MED ORDER — SODIUM CHLORIDE 0.9 % IV BOLUS (SEPSIS)
1000.0000 mL | INTRAVENOUS | Status: AC
  Administered 2017-11-25: 1000 mL via INTRAVENOUS

## 2017-11-25 MED ORDER — KETAMINE HCL 10 MG/ML IJ SOLN
200.0000 mg | Freq: Once | INTRAMUSCULAR | Status: AC
  Administered 2017-11-25: 200 mg via INTRAVENOUS

## 2017-11-25 MED ORDER — FENTANYL 2500MCG IN NS 250ML (10MCG/ML) PREMIX INFUSION
150.0000 ug/h | INTRAVENOUS | Status: DC
  Administered 2017-11-25: 150 ug/h via INTRAVENOUS
  Filled 2017-11-25: qty 250

## 2017-11-25 MED ORDER — LORAZEPAM 2 MG/ML IJ SOLN
2.0000 mg | Freq: Once | INTRAMUSCULAR | Status: AC
  Administered 2017-11-25: 2 mg via INTRAVENOUS

## 2017-11-25 MED ORDER — SODIUM CHLORIDE 0.9 % IV BOLUS (SEPSIS)
1000.0000 mL | INTRAVENOUS | Status: AC
Start: 2017-11-25 — End: 2017-11-25
  Administered 2017-11-25: 1000 mL via INTRAVENOUS

## 2017-11-25 MED ORDER — MIDAZOLAM HCL 5 MG/5ML IJ SOLN
INTRAMUSCULAR | Status: AC
  Administered 2017-11-25: 2 mg via INTRAVENOUS
  Filled 2017-11-25: qty 5

## 2017-11-25 MED ORDER — SUCCINYLCHOLINE CHLORIDE 20 MG/ML IJ SOLN
120.0000 mg | Freq: Once | INTRAMUSCULAR | Status: AC
  Administered 2017-11-25: 120 mg via INTRAVENOUS

## 2017-11-25 MED ORDER — FENTANYL CITRATE (PF) 100 MCG/2ML IJ SOLN
150.0000 ug | INTRAMUSCULAR | Status: DC
Start: 2017-11-25 — End: 2017-11-25
  Filled 2017-11-25: qty 4

## 2017-11-25 MED ORDER — LORAZEPAM 2 MG/ML IJ SOLN
INTRAMUSCULAR | Status: AC
  Administered 2017-11-25: 2 mg via INTRAVENOUS
  Filled 2017-11-25: qty 1

## 2017-11-25 MED ORDER — SODIUM CHLORIDE 0.9 % IV SOLN
4.0000 mg/h | INTRAVENOUS | Status: DC
  Administered 2017-11-25: 4 mg/h via INTRAVENOUS
  Filled 2017-11-25: qty 10

## 2017-11-25 MED ORDER — MAGNESIUM SULFATE 2 GM/50ML IV SOLN
2.0000 g | Freq: Once | INTRAVENOUS | Status: DC
Start: 2017-11-25 — End: 2017-11-25

## 2017-11-25 NOTE — ED Notes (Signed)
[facility] leaving with pt att

## 2017-11-25 NOTE — ED Provider Notes (Signed)
[facility] Emergency Department Provider Note  ____________________________________________   First MD Initiated Contact with Patient 11/25/17 [phone]     (approximate)  I have reviewed the triage vital signs and the nursing notes.   HISTORY  Chief Complaint Seizures  Level 5 caveat:  history/ROS limited by acute/critical illness  HPI Ross [name] is a 52 y.o. male with known seizure disorder who takes Keppra once and who has a history of alcohol abuse but who reportedly has not used alcohol for more than 6 months and who is been exercising recently and trying to get more healthy.  He presents by EMS after having at least 4 seizures at home and 2 witnessed seizure by EMS during transport.  He was in his normal state of health yesterday and then at about 30 in the morning he had a generalized seizure witnessed by his family.  It resolved on its own but before he returned to baseline he had multiple additional seizures.  He received Versed by EMS but they witnessed 2 additional short seizures while in route to the hospital.  These various seizure-like episodes included both generalized tonic-clonic activity and what sounds like partial seizures with gaze deviation and only one arm shaking.  Upon his arrival in the emergency department he appears postictal, obtunded and responsive to loud voice and painful stimuli but not answering questions.  He is protecting his airway but cannot provide any history.  He reportedly has not had any recent illnesses, has not been drinking alcohol, no new drugs, no new medications.  After his family arrived later I spoke with his wife and adult son and they said that he has been trying and organic liquid diet recently but it is unknown if this drink would have any interaction with Keppra.   Past Medical History:  Diagnosis Date  . Acute renal failure (HCC) 08/23/2015  . Anxiety   . Cardiac arrest (HCC) 08/23/2011  . Hypertension    . Mental disorder    PTSD  . Myocardial infarction (HCC)   . Pancreatitis   . Pneumonia 09/04/2011  . PTSD (post-traumatic stress disorder)   . Rhabdomyolysis 08/23/2015  . Schizophrenia (HCC)   . Seizures (HCC)    currently dx with seizures  . Sleep apnea    had surgery to correct it  . SOB (shortness of breath) 09/11/2011    Patient Active Problem List   Diagnosis Date Noted  . Paranoid schizophrenia (HCC) 09/14/2015  . Alcohol abuse   . Pleural effusion   . Acute respiratory failure with hypoxia (HCC)   . Altered mental status   . Acute respiratory failure (HCC) 08/23/2015  . Enteritis due to Clostridium difficile 08/23/2015  . Rhabdomyolysis 08/23/2015  . Acute renal failure (HCC) 08/23/2015  . Sepsis (HCC) 08/21/2015  . Psychoses (HCC)   . Psychosis (HCC) 03/28/2015  . Alcohol use disorder, severe, dependence (HCC) 02/21/2015  . Cardiomyopathy (HCC) 11/20/2013  . Hypomagnesemia 11/20/2013  . Alcohol withdrawal (HCC) 11/19/2013  . HTN (hypertension) 09/12/2011  . Aspiration pneumonia (HCC) 09/11/2011  . RUQ abdominal pain 09/11/2011  . SOB (shortness of breath) 09/11/2011  . Pneumonia 09/04/2011  . Fever 09/04/2011  . Acute pancreatitis 09/03/2011  . Abdominal pain 09/03/2011  . Acute encephalopathy 08/28/2011  . Hypernatremia 08/27/2011  . Poisoning by anticholinergic drug 08/23/2011  . Eczema 08/23/2011  . Papular rash, generalized 08/23/2011  . HTN (hypertension), malignant 08/23/2011  . Hyperglycemia 08/23/2011  . PTSD (post-traumatic stress disorder) 08/23/2011  .  Cardiac arrest (HCC) 08/23/2011  . QT prolongation 08/23/2011    Past Surgical History:  Procedure Laterality Date  . INTUBATION  08/26/2015      . LEFT HEART CATHETERIZATION WITH CORONARY ANGIOGRAM N/A 08/24/2011   Procedure: LEFT HEART CATHETERIZATION WITH CORONARY ANGIOGRAM;  Surgeon: Corky Crafts, MD;  Location: [facility] CATH LAB;  Service: Cardiovascular;  Laterality: N/A;  .  TONSILLECTOMY    . UVULOPALATOPHARYNGOPLASTY, TONSILLECTOMY AND SEPTOPLASTY  06/16/2000    Prior to Admission medications   Medication Sig Start Date End Date Taking? Authorizing Provider  levETIRAcetam (KEPPRA) 750 MG tablet Take 750 mg by mouth 2 (two) times daily.  10/24/17  Yes [provider]  famotidine (PEPCID) 20 MG tablet Take 1 tablet (20 mg total) by mouth 2 (two) times daily. 11/10/16   Emily Filbert, MD  ondansetron (ZOFRAN ODT) 4 MG disintegrating tablet Take 1 tablet (4 mg total) by mouth every 8 (eight) hours as needed for nausea or vomiting. 11/10/16   Emily Filbert, MD  trolamine salicylate (ASPERCREME) 10 % cream Apply topically. 11/22/13   [provider]    Allergies Antihistamines, diphenhydramine-type; Benadryl [diphenhydramine hcl]; and Hydroxyzine  Family History  Problem Relation Age of Onset  . Heart attack Mother     Social History Social History   Tobacco Use  . Smoking status: Current Every Day Smoker    Types: Cigars  . Smokeless tobacco: Never Used  Substance Use Topics  . Alcohol use: Yes    Comment: 70 oz a week  . Drug use: Yes    Types: Marijuana    Comment: Daily    Review of Systems  Level 5 caveat:  history/ROS limited by acute/critical illness  Multiple seizures without returning to baseline.  No recent illnesses reported. ____________________________________________   PHYSICAL EXAM:  VITAL SIGNS: ED Triage Vitals  Enc Vitals Group     BP 11/25/17 0234 103/76     Pulse Rate 11/25/17 0234 (!) 114     Resp 11/25/17 0234 (!) 28     Temp 11/25/17 0234 98.7 F (37.1 C)     Temp Source 11/25/17 0234 Oral     SpO2 11/25/17 0234 100 %     Weight 11/25/17 0240 79.4 kg (175 lb)     Height 11/25/17 0240 1.803 m (5\' 11" )     Head Circumference --      Peak Flow --      Pain Score --      Pain Loc --      Pain Edu? --      Excl. in GC? --     Constitutional: Obtunded/post-ictal.  No acute distress but  obvious off-baseline Eyes: Conjunctivae are normal. EOMI, PERRL Head: Atraumatic. Nose: No congestion/rhinnorhea. Mouth/Throat: Mucous membranes are moist. Neck: No stridor.  No meningeal signs.   Cardiovascular: Sinus tachycardia, regular rhythm. Good peripheral circulation. Grossly normal heart sounds. Respiratory: Normal respiratory effort.  No retractions. Lungs CTAB. Gastrointestinal: Soft and nontender. No distention.  Musculoskeletal: No lower extremity tenderness nor edema. No gross deformities of extremities. No evidence of injuries Neurologic:  GCS 11 upon arrival (eyes open to speech, inappropriate words, localizes to pain) Skin:  Skin is warm, dry and intact. No rash noted.   ____________________________________________   LABS (all labs ordered are listed, but only abnormal results are displayed)  Labs Reviewed  COMPREHENSIVE METABOLIC PANEL - Abnormal; Notable for the following components:      Result Value   Potassium 3.3 (*)  CO2 16 (*)    Glucose, Bld 107 (*)    AST 49 (*)    ALT 13 (*)    Anion gap 17 (*)    All other components within normal limits  ACETAMINOPHEN LEVEL - Abnormal; Notable for the following components:   Acetaminophen (Tylenol), Serum <10 (*)    All other components within normal limits  URINE DRUG SCREEN, QUALITATIVE ([facility] ONLY) - Abnormal; Notable for the following components:   Cannabinoid 50 Ng, Ur [place] POSITIVE (*)    Benzodiazepine, Ur Scrn POSITIVE (*)    All other components within normal limits  LACTIC ACID, PLASMA - Abnormal; Notable for the following components:   Lactic Acid, Venous 7.3 (*)    All other components within normal limits  CBC WITH DIFFERENTIAL/PLATELET - Abnormal; Notable for the following components:   MCV 78.2 (*)    MCH 25.5 (*)    RDW 16.4 (*)    Platelets 120 (*)    All other components within normal limits  URINALYSIS, ROUTINE W REFLEX MICROSCOPIC - Abnormal; Notable for the following components:   Color,  Urine YELLOW (*)    APPearance HAZY (*)    Protein, ur 30 (*)    Bacteria, UA RARE (*)    Squamous Epithelial / LPF 0-5 (*)    All other components within normal limits  BLOOD GAS, ARTERIAL - Abnormal; Notable for the following components:   pO2, Arterial 168 (*)    Acid-base deficit 3.3 (*)    All other components within normal limits  MAGNESIUM  SALICYLATE LEVEL  ETHANOL  LIPASE, BLOOD  LACTIC ACID, PLASMA  TROPONIN I  CBC WITH DIFFERENTIAL/PLATELET  CBG MONITORING, ED   ____________________________________________  EKG  ED ECG REPORT I, Loleta Rose, the attending physician, personally viewed and interpreted this ECG.  Date: 11/25/2017 EKG Time: 02:36 Rate: 128 Rhythm: sinus tachycardia QRS Axis: normal Intervals: normal ST/T Wave abnormalities: Non-specific ST segment / T-wave changes, but no evidence of acute ischemia. Narrative Interpretation: no evidence of acute ischemia   ____________________________________________  RADIOLOGY I, Loleta Rose, personally viewed and evaluated these images (plain radiographs) as part of my medical decision making, as well as reviewing the written report by the radiologist.  ED MD interpretation: Acute abnormalities on CT head.  First portable chest x-ray shows no acute abnormalities.  Second portable chest x-ray shows appropriately placed endotracheal tube.  Official radiology report(s): Ct Head Wo Contrast  Result Date: 11/25/2017 CLINICAL DATA:  Acute onset of seizures. Altered level of consciousness. EXAM: CT HEAD WITHOUT CONTRAST TECHNIQUE: Contiguous axial images were obtained from the base of the skull through the vertex without intravenous contrast. COMPARISON:  CT of the head performed 08/21/2015, MRI of the brain performed 08/26/2011 FINDINGS: Brain: No evidence of acute infarction, hemorrhage, hydrocephalus, extra-axial collection or mass lesion/mass effect. Mild periventricular white matter change likely reflects small  vessel ischemic microangiopathy. Cerebellar atrophy is noted. The posterior fossa, including the cerebellum, brainstem and fourth ventricle, is within normal limits. The third and lateral ventricles, and basal ganglia are unremarkable in appearance. The cerebral hemispheres are symmetric in appearance, with normal gray-white differentiation. No mass effect or midline shift is seen. Vascular: No hyperdense vessel or unexpected calcification. Skull: There is no evidence of fracture; visualized osseous structures are unremarkable in appearance. Sinuses/Orbits: The visualized portions of the orbits are within normal limits. The paranasal sinuses and mastoid air cells are well-aerated. Other: No significant soft tissue abnormalities are seen. IMPRESSION: 1. No acute intracranial  pathology seen on CT. 2. Mild small vessel ischemic microangiopathy. 3. Cerebellar atrophy noted. Electronically Signed   By: Roanna Raider M.D.   On: 11/25/2017 03:45   Dg Chest Portable 1 View  Result Date: 11/25/2017 CLINICAL DATA:  Endotracheal tube placement. EXAM: PORTABLE CHEST 1 VIEW COMPARISON:  Chest radiograph performed earlier today at 3:43 a.m. FINDINGS: The patient's endotracheal tube is seen ending 4 cm above the carina. An enteric tube is noted extending below the diaphragm. The lungs are well-aerated and clear. There is no evidence of focal opacification, pleural effusion or pneumothorax. The cardiomediastinal silhouette is within normal limits. No acute osseous abnormalities are seen. IMPRESSION: 1. Endotracheal tube seen ending 4 cm above the carina. 2. No acute cardiopulmonary process seen. Electronically Signed   By: Roanna Raider M.D.   On: 11/25/2017 05:19   Dg Chest Portable 1 View  Result Date: 11/25/2017 CLINICAL DATA:  Recurrent seizure EXAM: PORTABLE CHEST 1 VIEW COMPARISON:  09/10/2015 FINDINGS: The heart size and mediastinal contours are within normal limits. Both lungs are clear. The visualized skeletal  structures are unremarkable. IMPRESSION: No active disease. Electronically Signed   By: Jasmine Pang M.D.   On: 11/25/2017 03:54    ____________________________________________   PROCEDURES  Critical Care performed: Yes, see critical care procedure note(s)   Procedure(s) performed:   .Critical Care Performed by: Loleta Rose, MD Authorized by: Loleta Rose, MD   Critical care provider statement:    Critical care time (minutes):  60   Critical care time was exclusive of:  Separately billable procedures and treating other patients   Critical care was necessary to treat or prevent imminent or life-threatening deterioration of the following conditions:  CNS failure or compromise and respiratory failure   Critical care was time spent personally by me on the following activities:  Development of treatment plan with patient or surrogate, discussions with consultants, evaluation of patient's response to treatment, examination of patient, obtaining history from patient or surrogate, ordering and performing treatments and interventions, ordering and review of laboratory studies, ordering and review of radiographic studies, pulse oximetry, re-evaluation of patient's condition and review of old charts Procedure Name: Intubation Date/Time: 11/25/2017 5:11 AM Performed by: Loleta Rose, MD Pre-anesthesia Checklist: Patient identified, Patient being monitored, Emergency Drugs available, Timeout performed and Suction available Oxygen Delivery Method: Non-rebreather mask Preoxygenation: Pre-oxygenation with 100% oxygen Induction Type: Rapid sequence Ventilation: Mask ventilation without difficulty Laryngoscope Size: Glidescope and 4 Tube size: 7.5 mm Number of attempts: 1 Placement Confirmation: ETT inserted through vocal cords under direct vision,  CO2 detector and Breath sounds checked- equal and bilateral Secured at: 24 cm Tube secured with: ETT holder Dental Injury: Teeth and Oropharynx as  per pre-operative assessment         ____________________________________________   INITIAL IMPRESSION / ASSESSMENT AND PLAN / ED COURSE  As part of my medical decision making, I reviewed the following data within the electronic MEDICAL RECORD NUMBER History obtained from family, Nursing notes reviewed and incorporated, Labs reviewed , EKG interpreted , Old chart reviewed and Discussed with admitting physician     Patient's presentation is consistent with status epilepticus.  He has not returned to baseline but he is protecting his airway.  Broadly including head CT and extensive lab work to look for any possible cause but the differential diagnosis includes medication noncompliance, acute infection of any given source, alcohol withdrawal if he started drinking again unbeknownst to his family, other drugs, medication side effect or side effect  of his organic liquid diet, etc. he may need to be intubated if he continues to seize in order to protect his airway but at this time I will start with a gram of Keppra IV and we will keep Ativan nearby.  He had one more seizure while I was in the room after his arrival but he stopped quickly.  After his family arrived I discussed with his wife and pointed out that he is a [place] patient.  [facility] but [facility] said that he has been to [facility] before and she wants him to go back to [facility] (he needs a higher level of care than we can provide at [facility] because he needs continuous EEG monitoring).  She said that she does not want me to contact the [place] and that she will sign a form stating that is her preference.  Clinical Course as of Nov 25 628  Sat Nov 25, 2017  1610 The patient is noted to have a lactate>4. With the current information available to me, I don't think the patient is in septic shock. The lactate>4, is related to seizure activity  Lactic Acid, Venous: (!!) 7.3 [CF]  0341 Alcohol, Ethyl (B): <10 [CF]  0352 No acute abnormalities  on CT scan CT Head Wo Contrast [CF]  0401 I reassessed the patient and updated the family.  [CF]  [phone] Patient seized again during a time I was with another patient and received Ativan 2 mg IV by nurse based on a prior verbal order I had given.  Then, shortly after my last note about reassessing the patient, the patient had another seizure.  It was brief, but he continues to be obtunded and I am worried about airway protection.  I intubated the patient without any complications and he is currently stable.  I called and spoke by phone with Dr. Ala Dach with the [facility] neuro ICU and she accepted the patient.  We are now working on transportation.  [CF]  [phone] The patient came out of the paralytic RSI sedation very quickly and became combative.  He was given additional Versed 2 mg IV and did not respond.  Infusions of Versed and fentanyl were not yet available and he was given fentanyl 150 mcg and continued to fight the ventilator and to be combative with his arms and ran the risk of dislodging his tube or losing his IV.  I authorized the administration of ketamine 200 mg IV, after which time the patient calmed considerably but continued to reach out with his hands.  This does not appear to be seizure-like activity but whether withdrawal from painful stimuli.  Although I do not wish to keep him continually paralyzed given his history of status epilepticus, I authorized rocuronium 100 mg IV to protect him while we are arranging transport and starting the fentanyl and Versed infusions  [CF]  8119 Patient hemodynamically stable at this time, awaiting radiograph results.  ABG reassuring  [CF]  0538 Patient still sedated and/or paralyzed, vital signs stable, no tachycardia, SPO2 100%.  [facility] in route  [CF]  0541 No evidence of UTI  [CF]  0629 Reassessed patient and personally gave bedside report to [facility] crew.  [CF]    Clinical Course User Index [CF] Loleta Rose, MD     ____________________________________________  FINAL CLINICAL IMPRESSION(S) / ED DIAGNOSES  Final diagnoses:  Status epilepticus (HCC)  Elevated lactic acid level     MEDICATIONS GIVEN DURING THIS VISIT:  Medications  magnesium sulfate IVPB 2 g 50 mL (not administered)  midazolam (VERSED) 5 MG/5ML injection 4 mg (0 mg Intravenous Stopped 11/25/17 0438)  midazolam (VERSED) 50 mg in sodium chloride 0.9 % 50 mL (1 mg/mL) infusion (4 mg/hr Intravenous Transfusing/Transfer 11/25/17 0622)  fentaNYL 2500mcg in NS 250mL (8310mcg/ml) infusion-PREMIX (150 mcg/hr Intravenous New Bag/Given 11/25/17 0452)  levETIRAcetam (KEPPRA) 1,000 mg in sodium chloride 0.9 % 100 mL IVPB (0 mg Intravenous Stopped 11/25/17 0336)  thiamine (B-1) injection 100 mg (100 mg Intravenous Given 11/25/17 0247)  LORazepam (ATIVAN) injection 2 mg (2 mg Intravenous Given 11/25/17 0247)  sodium chloride 0.9 % bolus 1,000 mL (0 mLs Intravenous Stopped 11/25/17 0555)  sodium chloride 0.9 % bolus 1,000 mL (1,000 mLs Intravenous New Bag/Given 11/25/17 0414)  phenytoin (DILANTIN) 1,590 mg in sodium chloride 0.9 % 250 mL IVPB (0 mg/kg  79.4 kg Intravenous Stopped 11/25/17 0556)  midazolam (VERSED) 5 MG/5ML injection 2 mg (2 mg Intravenous Given 11/25/17 0408)  etomidate (AMIDATE) injection 20 mg (20 mg Intravenous Given 11/25/17 0426)  succinylcholine (ANECTINE) injection 120 mg (120 mg Intravenous Given 11/25/17 0426)  fentaNYL (SUBLIMAZE) injection 100 mcg (100 mcg Intravenous Given 11/25/17 0432)  rocuronium (ZEMURON) injection 100 mg (100 mg Intravenous Given 11/25/17 0448)  ketamine (KETALAR) injection 200 mg (200 mg Intravenous Given 11/25/17 0443)  fentaNYL (SUBLIMAZE) injection 100 mcg (100 mcg Intravenous Given 11/25/17 0435)     ED Discharge Orders    None       Note:  This document was prepared using Dragon voice recognition software and may include unintentional dictation errors.    Loleta RoseForbach, [name], MD 11/25/17 0630

## 2017-11-25 NOTE — ED Notes (Signed)
EMTALA and Medical Necessity documentation reviewed at this time and complete per policy. 

## 2017-11-25 NOTE — ED Triage Notes (Signed)
Patient presents to Emergency Department via [facility] with complaints of witnessed seizures.  Per EMS family saw 4 seizures, rescue saw 2 and EMS saw 2-3 clonic/tonic "full body"  Hx of ETOH abuse (last 6 month ago) and seizures.   EMS gave 2 mg versed IV

## 2017-11-25 NOTE — ED Notes (Signed)
[facility] arrived for pt

## 2017-11-25 NOTE — ED Notes (Signed)
Per wife reports pt recently started all liquid energy diet drink approx 2 days ago, wife sets out meds and pt is responsible to take them  Pt is prescribed Keppra 750 BID for seizures

## 2017-11-25 NOTE — ED Notes (Signed)
Patient transported to CT 

## 2017-11-25 NOTE — ED Notes (Signed)
CRITICAL LAB: [facility] is 7.3 , [facility], Dr. York CeriseForbach notified, orders received

## 2017-11-28 MED ORDER — GENERIC EXTERNAL MEDICATION
Status: DC
Start: ? — End: 2017-11-28

## 2017-11-28 MED ORDER — GENERIC EXTERNAL MEDICATION
1500.00 | Status: DC
Start: 2017-11-28 — End: 2017-11-28

## 2017-11-28 MED ORDER — DEXTROSE 50 % IV SOLN
12.50 | INTRAVENOUS | Status: DC
Start: ? — End: 2017-11-28

## 2017-11-28 MED ORDER — PHENYTOIN SODIUM 50 MG/ML IJ SOLN
100.00 | INTRAMUSCULAR | Status: DC
Start: 2017-11-28 — End: 2017-11-28

## 2017-11-28 MED ORDER — NOREPINEPHRINE BITARTRATE IV
0.01 | INTRAVENOUS | Status: DC
Start: ? — End: 2017-11-28

## 2017-11-28 MED ORDER — MIDAZOLAM HCL 10 MG/2ML IJ SOLN
12.00 | INTRAMUSCULAR | Status: DC
Start: ? — End: 2017-11-28

## 2017-11-28 MED ORDER — MAGNESIUM SULFATE 2 GM/50ML IV SOLN
2.00 | INTRAVENOUS | Status: DC
Start: ? — End: 2017-11-28

## 2017-11-28 MED ORDER — GLUCAGON HCL RDNA (DIAGNOSTIC) 1 MG IJ SOLR
1.00 | INTRAMUSCULAR | Status: DC
Start: ? — End: 2017-11-28

## 2017-11-28 MED ORDER — FOLIC ACID 1 MG PO TABS
1.00 | ORAL_TABLET | ORAL | Status: DC
Start: 2017-11-29 — End: 2017-11-28

## 2017-11-28 MED ORDER — ONDANSETRON HCL 4 MG/2ML IJ SOLN
4.00 | INTRAMUSCULAR | Status: DC
Start: ? — End: 2017-11-28

## 2017-11-28 MED ORDER — ENOXAPARIN SODIUM 40 MG/0.4ML ~~LOC~~ SOLN
40.00 | SUBCUTANEOUS | Status: DC
Start: 2017-11-29 — End: 2017-11-28

## 2017-11-28 MED ORDER — ACETAMINOPHEN 325 MG PO TABS
650.00 | ORAL_TABLET | ORAL | Status: DC
Start: ? — End: 2017-11-28

## 2017-11-28 MED ORDER — DEXTROSE 10 % IV SOLN
50.00 | INTRAVENOUS | Status: DC
Start: ? — End: 2017-11-28

## 2017-11-28 MED ORDER — GENERIC EXTERNAL MEDICATION
0.60 | Status: DC
Start: ? — End: 2017-11-28

## 2017-11-28 MED ORDER — THIAMINE HCL 100 MG PO TABS
100.00 | ORAL_TABLET | ORAL | Status: DC
Start: 2017-11-28 — End: 2017-11-28

## 2017-11-28 MED ORDER — SENNOSIDES-DOCUSATE SODIUM 8.6-50 MG PO TABS
2.00 | ORAL_TABLET | ORAL | Status: DC
Start: 2017-11-28 — End: 2017-11-28

## 2017-11-28 MED ORDER — DEXMEDETOMIDINE HCL IN NACL 400 MCG/100ML IV SOLN
.20 | INTRAVENOUS | Status: DC
Start: ? — End: 2017-11-28

## 2017-11-28 MED ORDER — CHLORHEXIDINE GLUCONATE 0.12 % MT SOLN
5.00 | OROMUCOSAL | Status: DC
Start: 2017-11-28 — End: 2017-11-28

## 2017-11-28 MED ORDER — GENERIC EXTERNAL MEDICATION
200.00 | Status: DC
Start: 2017-11-28 — End: 2017-11-28

## 2022-01-23 ENCOUNTER — Other Ambulatory Visit: Payer: Self-pay

## 2022-01-23 ENCOUNTER — Emergency Department (HOSPITAL_COMMUNITY)
Admission: EM | Admit: 2022-01-23 | Discharge: 2022-01-23 | Disposition: A | Discharge status: Home / Self Care | Payer: No Typology Code available for payment source | Attending: Emergency Medicine | Admitting: Emergency Medicine

## 2022-01-23 ENCOUNTER — Encounter (HOSPITAL_COMMUNITY): Payer: Self-pay | Admitting: Emergency Medicine

## 2022-01-23 DIAGNOSIS — Z760 Encounter for issue of repeat prescription: Secondary | ICD-10-CM | POA: Insufficient documentation

## 2022-01-23 MED ORDER — LACOSAMIDE 200 MG PO TABS: 200.0000 mg | ORAL_TABLET | Freq: Two times a day (BID) | ORAL | 0 refills | Status: AC

## 2022-01-23 MED ORDER — LACOSAMIDE 50 MG PO TABS
200.0000 mg | ORAL_TABLET | Freq: Once | ORAL | Status: AC
  Administered 2022-01-23: 200 mg via ORAL
  Filled 2022-01-23: qty 4

## 2022-01-23 NOTE — Discharge Instructions (Signed)
At this time there does not appear to be the presence of an emergent medical condition, however there is always the potential for conditions to change. Please read and follow the below instructions. ? ?Please return to the Emergency Department immediately for any new or worsening symptoms. ?Please be sure to follow up with your Primary Care Provider within one week regarding your visit today; please call their office to schedule an appointment even if you are feeling better for a follow-up visit. ?Please follow-up closely with the [facility] for recheck and future medication refills. ?Do not drive, ride a bicycle, climb a ladder, go swimming or perform any other potentially dangerous activities until you are cleared to do so by your neurologist. ? ?Go to the nearest Emergency Department immediately if: ?You have fever or chills ?You have any of these problems: ?A seizure ?Many seizures in a row and you do not feel better between seizures. ?A seizure that makes it harder to breathe. ?A seizure and you can no longer speak or use part of your body. ?You do not wake up right after a seizure. ?You get hurt during a seizure. ?You feel confused or have pain right after a seizure. ?You have any new/concerning or worsening of symptoms. ? ? ?Please read the additional information packets attached to your discharge summary. ? ?Do not take your medicine if  develop an itchy rash, swelling in your mouth or lips, or difficulty breathing; call 911 and seek immediate emergency medical attention if this occurs. ? ?You may review your lab tests and imaging results in their entirety on your MyChart account.  Please discuss all results of fully with your primary care provider and other specialist at your follow-up visit. ? ?Note: Portions of this text may have been transcribed using voice recognition software. Every effort was made to ensure accuracy; however, inadvertent computerized transcription errors may still be present. ? ?

## 2022-01-23 NOTE — ED Provider Notes (Addendum)
?[facility] EMERGENCY DEPARTMENT ?Provider Note ? ? ?CSN: [id number] ?Arrival date & time: 01/23/22  0907 ? ?  ? ?History ? ?Chief Complaint  ?Patient presents with  ? Medication Refill  ? ? ?Ross [name] is a 56 y.o. male reports history of seizures presented to this ER today for a medication refill.  Patient reports that he is treated by the [facility] in [facility] for his seizures, he is currently taking lacosamide 200 mg twice daily for seizure control.  Patient reports that he has been on this medication for several years but was briefly taken off of it and was started on Keppra and Depakote for a few months however he was changed back to his lacosamide a few weeks ago by the [place].  Patient reports that he ran out of his lacosamide yesterday, he is currently in [place] visiting his mother for Easter.  Patient reports that while at home yesterday he "felt a seizure coming on" he was able to lay down on the ground, he reports he had a seizure but did not have any injuries.  He reports this is normal for him, he was able to continue his day yesterday without difficulty.  He denies recurrence of seizure since yesterday.  He denies recent illness, fever, chills, headache, trauma, nausea, vomiting or any additional concerns. ? ?HPI ? ?  ? ?Home Medications ?Prior to Admission medications   ?Medication Sig Start Date End Date Taking? Authorizing Provider  ?divalproex (DEPAKOTE) 500 MG DR tablet Take 500 mg by mouth 2 (two) times daily. 09/01/21 11/26/22 Yes [provider]  ?gabapentin (NEURONTIN) 300 MG capsule Take 100 mg by mouth in the morning, at noon, and at bedtime.   Yes [provider]  ?memantine (NAMENDA) 10 MG tablet Take 10 mg by mouth 2 (two) times daily.   Yes [provider]  ?traZODone (DESYREL) 100 MG tablet Take 100 mg by mouth at bedtime.   Yes [provider]  ?trolamine salicylate (ASPERCREME) 10 % cream Apply 1 application. topically 2 (two) times  daily as needed for muscle pain. 11/22/13  Yes [provider]  ?lacosamide (VIMPAT) 200 MG TABS tablet Take 1 tablet (200 mg total) by mouth 2 (two) times daily. 01/23/22   Bill Salinas, PA-C  ?   ? ?Allergies    ?Antihistamines, diphenhydramine-type; Benadryl [diphenhydramine hcl]; Hydroxyzine; and Peanut-containing drug products   ? ?Review of Systems   ?Review of Systems  ?Constitutional: Negative.  Negative for chills and fever.  ?Gastrointestinal: Negative.  Negative for nausea and vomiting.  ?Musculoskeletal:  Negative for arthralgias and myalgias.  ?Neurological:  Positive for seizures (One yesterday). Negative for headaches.  ? ?Physical Exam ?Updated Vital Signs ?BP 126/80   Pulse (!) 57   Temp 98.1 ?F (36.7 ?C) (Oral)   Resp 16   SpO2 100%  ?Physical Exam ?Constitutional:   ?   General: He is not in acute distress. ?   Appearance: Normal appearance. He is well-developed. He is not ill-appearing or diaphoretic.  ?HENT:  ?   Head: Normocephalic and atraumatic.  ?Eyes:  ?   General: Vision grossly intact. Gaze aligned appropriately.  ?   Pupils: Pupils are equal, round, and reactive to light.  ?Neck:  ?   Trachea: Trachea and phonation normal.  ?Pulmonary:  ?   Effort: Pulmonary effort is normal. No respiratory distress.  ?Abdominal:  ?   General: There is no distension.  ?   Palpations: Abdomen is soft.  ?  Tenderness: There is no abdominal tenderness. There is no guarding or rebound.  ?Musculoskeletal:     ?   General: Normal range of motion.  ?   Cervical back: Normal range of motion.  ?Skin: ?   General: Skin is warm and dry.  ?Neurological:  ?   Mental Status: He is alert.  ?   GCS: GCS eye subscore is 4. GCS verbal subscore is 5. GCS motor subscore is 6.  ?   Comments: Speech is clear and goal oriented, follows commands ?Major Cranial nerves without deficit, no facial droop ?Moves extremities without ataxia, coordination intact  ?Psychiatric:     ?   Behavior: Behavior normal.  ? ? ?ED  Results / Procedures / Treatments   ?Labs ?(all labs ordered are listed, but only abnormal results are displayed) ?Labs Reviewed - No data to display ? ?EKG ?None ? ?Radiology ?No results found. ? ?Procedures ?Procedures  ? ? ?Medications Ordered in ED ?Medications  ?lacosamide (VIMPAT) tablet 200 mg (200 mg Oral Given 01/23/22 1044)  ? ? ?ED Course/ Medical Decision Making/ A&P ?  ?                        ?Medical Decision Making ?Amount and/or Complexity of Data Reviewed ?External Data Reviewed: notes. ?   Details: I reviewed external records, patient had 2 visits in [facility] of this year [facility] for seizure/medication refill.  It appears at that time he was on Depakote/Keppra, patient clarified with me that he has been instead restarted on lacosamide by his neurologist at the [facility] and they have discontinued Depakote and Keppra.  I am unable to review records from the [facility]. ? ?Patient has brought his lacosamide bottle with him today, this was filled on 11/26/2021 prescribed by [facility] [place].  Lacosamide 200 mg twice daily. ? ?Risk ?Prescription drug management. ? ? ?56 year old male presented for medication refill of lacosamide 200 mg twice daily for seizure control.  Patient reports he had 1 seizure yesterday while at home but he was able to "feel this coming on" and lay down on the ground.  He denies any injury following his seizure, no recurrence since yesterday.  He has no complaints or concerns at this time he is well-appearing and in no acute distress.  I discussed the case with Dr. Dalene SeltzerSchlossman, plan to refill patient's lacosamide and [facility]; no indication for further eval at this time.  Of note patient is well-appearing no acute distress fully alert and oriented here, additionally patient does not appear to be in withdrawal. ?---- ?Patient was reassessed after he was given his first dose of lacosamide here.  He is well-appearing, no complaints, denies any  neurologic symptoms.  He has just finished eating a sandwich.  We discussed seizure precautions today.  ? ?At this time there does not appear to be any evidence of an acute emergency medical condition and the patient appears stable for discharge with appropriate outpatient follow up. Diagnosis was discussed with patient who verbalizes understanding of care plan and is agreeable to discharge. I have discussed return precautions with patient who verbalizes understanding. Patient encouraged to follow-up with their PCP. All questions answered. ? ? ?Note: Portions of this report may have been transcribed using voice recognition software. Every effort was made to ensure accuracy; however, inadvertent computerized transcription errors may still be present. ? ? ? ? ? ? ? ? ?Final Clinical  Impression(s) / ED Diagnoses ?Final diagnoses:  ?Medication refill  ? ? ?Rx / DC Orders ?ED Discharge Orders   ? ?      Ordered  ?  Medication reconciliation pending pharmacist review       ?Provider:  (Not yet assigned)  ? 01/23/22 1004  ?  lacosamide (VIMPAT) 200 MG TABS tablet  2 times daily       ? 01/23/22 1058  ? ?  ?  ? ?  ? ? ?  ?Bill Salinas, PA-C ?01/23/22 1104 ? ?  ?Bill Salinas, PA-C ?01/23/22 1113 ? ?  ?Alvira Monday, MD ?01/24/22 0732 ? ?

## 2022-01-23 NOTE — ED Triage Notes (Signed)
Request med refill.  States out of seizure medication since yesterday.  States he had a seizure yesterday. ?

## 2022-12-01 ENCOUNTER — Ambulatory Visit: Payer: No Typology Code available for payment source | Admitting: Emergency Medicine

## 2022-12-11 ENCOUNTER — Emergency Department (HOSPITAL_COMMUNITY)
Admission: EM | Admit: 2022-12-11 | Discharge: 2022-12-11 | Disposition: A | Discharge status: Home / Self Care | Payer: No Typology Code available for payment source | Attending: Emergency Medicine | Admitting: Emergency Medicine

## 2022-12-11 ENCOUNTER — Encounter (HOSPITAL_COMMUNITY): Payer: Self-pay

## 2022-12-11 ENCOUNTER — Other Ambulatory Visit: Payer: Self-pay

## 2022-12-11 DIAGNOSIS — G40909 Epilepsy, unspecified, not intractable, without status epilepticus: Secondary | ICD-10-CM | POA: Insufficient documentation

## 2022-12-11 DIAGNOSIS — Z91010 Allergy to peanuts: Secondary | ICD-10-CM | POA: Diagnosis not present

## 2022-12-11 DIAGNOSIS — Z79899 Other long term (current) drug therapy: Secondary | ICD-10-CM | POA: Insufficient documentation

## 2022-12-11 LAB — CBC WITH DIFFERENTIAL/PLATELET
Abs Immature Granulocytes: 0.01 10*3/uL (ref 0.00–0.07)
Basophils Absolute: 0 10*3/uL (ref 0.0–0.1)
Basophils Relative: 0 %
Eosinophils Absolute: 0 10*3/uL (ref 0.0–0.5)
Eosinophils Relative: 0 %
HCT: 44.1 % (ref 39.0–52.0)
Hemoglobin: 14.4 g/dL (ref 13.0–17.0)
Immature Granulocytes: 0 %
Lymphocytes Relative: 59 %
Lymphs Abs: 3 10*3/uL (ref 0.7–4.0)
MCH: 28.7 pg (ref 26.0–34.0)
MCHC: 32.7 g/dL (ref 30.0–36.0)
MCV: 88 fL (ref 80.0–100.0)
Monocytes Absolute: 0.4 10*3/uL (ref 0.1–1.0)
Monocytes Relative: 7 %
Neutro Abs: 1.8 10*3/uL (ref 1.7–7.7)
Neutrophils Relative %: 34 %
Platelets: 124 10*3/uL — ABNORMAL LOW (ref 150–400)
RBC: 5.01 MIL/uL (ref 4.22–5.81)
RDW: 13.6 % (ref 11.5–15.5)
WBC: 5.1 10*3/uL (ref 4.0–10.5)
nRBC: 0 % (ref 0.0–0.2)

## 2022-12-11 LAB — BASIC METABOLIC PANEL
Anion gap: 10 (ref 5–15)
BUN: 12 mg/dL (ref 6–20)
CO2: 24 mmol/L (ref 22–32)
Calcium: 9.3 mg/dL (ref 8.9–10.3)
Chloride: 103 mmol/L (ref 98–111)
Creatinine, Ser: 1.31 mg/dL — ABNORMAL HIGH (ref 0.61–1.24)
GFR, Estimated: >60 mL/min (ref >60)
Glucose, Bld: 87 mg/dL (ref 70–99)
Potassium: 3.8 mmol/L (ref 3.5–5.1)
Sodium: 137 mmol/L (ref 135–145)

## 2022-12-11 LAB — RAPID URINE DRUG SCREEN, HOSP PERFORMED
Amphetamines: NOT DETECTED
Barbiturates: NOT DETECTED
Benzodiazepines: NOT DETECTED
Cocaine: NOT DETECTED
Opiates: NOT DETECTED
Tetrahydrocannabinol: POSITIVE — AB

## 2022-12-11 LAB — ETHANOL: Alcohol, Ethyl (B): <10 mg/dL (ref <10)

## 2022-12-11 LAB — CBG MONITORING, ED: Glucose-Capillary: 85 mg/dL (ref 70–99)

## 2022-12-11 MED ORDER — LACOSAMIDE 200 MG PO TABS: 200.0000 mg | ORAL_TABLET | Freq: Two times a day (BID) | ORAL | 0 refills | Status: AC

## 2022-12-11 MED ORDER — LACOSAMIDE 200 MG PO TABS
200.0000 mg | ORAL_TABLET | Freq: Two times a day (BID) | ORAL | 0 refills | Status: AC
Start: 2022-12-11 — End: ?
  Filled 2022-12-11: qty 60, 30d supply, fill #0

## 2022-12-11 MED ORDER — LACOSAMIDE 50 MG PO TABS
200.0000 mg | ORAL_TABLET | Freq: Once | ORAL | Status: AC
  Administered 2022-12-11: 200 mg via ORAL
  Filled 2022-12-11: qty 4

## 2022-12-11 NOTE — ED Provider Notes (Signed)
[facility] Provider Note   CSN: [id number] Arrival date & time: 12/11/22  1629     History  Chief Complaint  Patient presents with   Seizures    Ross [name] is a 57 y.o. male.  57 year old male with prior medical history as detailed below presents for evaluation.  Patient arrives with EMS transport from the hotel where he has been staying.  Patient reports that he had 2 seizures and then called EMS.  Patient reports that he has been on Vimpat -200 mg taken twice daily - for quite some time.  He reports he has been out of his Vimpat for the last 2 to 3 months.  On arrival he is alert and cooperative.  He is otherwise without complaint.  He denies injury related to his reported seizure activity.  Seizure activity was not witnessed by other persons.  Patient is requesting a sandwich or something to eat.    The history is provided by the patient and medical records.       Home Medications Prior to Admission medications   Medication Sig Start Date End Date Taking? Authorizing Provider  divalproex (DEPAKOTE) 500 MG DR tablet Take 500 mg by mouth 2 (two) times daily. 09/01/21 11/26/22  [provider]  famotidine (PEPCID) 20 MG tablet Take 20 mg by mouth 2 (two) times daily.    [provider]  gabapentin (NEURONTIN) 300 MG capsule Take 100 mg by mouth in the morning, at noon, and at bedtime.    [provider]  lacosamide (VIMPAT) 200 MG TABS tablet Take 1 tablet (200 mg total) by mouth 2 (two) times daily. 01/23/22   Nuala Alpha A, PA-C  memantine (NAMENDA) 10 MG tablet Take 10 mg by mouth 2 (two) times daily.    [provider]  Sertraline HCl 150 MG CAPS Take 150 mg by mouth daily.    [provider]  traZODone (DESYREL) 100 MG tablet Take 100 mg by mouth at bedtime.    [provider]  trolamine salicylate (ASPERCREME) 10 % cream Apply 1 application. topically 2 (two)  times daily as needed for muscle pain. 11/22/13   [provider]      Allergies    Antihistamines, diphenhydramine-type; Benadryl [diphenhydramine hcl]; Hydroxyzine; and Peanut-containing drug products    Review of Systems   Review of Systems  All other systems reviewed and are negative.   Physical Exam Updated Vital Signs BP (!) 131/92   Pulse 67   Temp 98.3 F (36.8 C)   Resp 15   Ht '5\' 10"'$  (1.778 m)   Wt 63.5 kg   SpO2 99%   BMI 20.09 kg/m  Physical Exam Vitals and nursing note reviewed.  Constitutional:      General: He is not in acute distress.    Appearance: Normal appearance. He is well-developed.  HENT:     Head: Normocephalic and atraumatic.  Eyes:     Conjunctiva/sclera: Conjunctivae normal.     Pupils: Pupils are equal, round, and reactive to light.  Cardiovascular:     Rate and Rhythm: Normal rate and regular rhythm.     Heart sounds: Normal heart sounds.  Pulmonary:     Effort: Pulmonary effort is normal. No respiratory distress.     Breath sounds: Normal breath sounds.  Abdominal:     General: There is no distension.     Palpations: Abdomen is soft.     Tenderness: There is no  abdominal tenderness.  Musculoskeletal:        General: No deformity. Normal range of motion.     Cervical back: Normal range of motion and neck supple.  Skin:    General: Skin is warm and dry.  Neurological:     General: No focal deficit present.     Mental Status: He is alert and oriented to person, place, and time. Mental status is at baseline.     Cranial Nerves: No cranial nerve deficit.     Sensory: No sensory deficit.     ED Results / Procedures / Treatments   Labs (all labs ordered are listed, but only abnormal results are displayed) Labs Reviewed  CBC WITH DIFFERENTIAL/PLATELET - Abnormal; Notable for the following components:      Result Value   Platelets 124 (*)    All other components within normal limits  BASIC METABOLIC PANEL - Abnormal; Notable  for the following components:   Creatinine, Ser 1.31 (*)    All other components within normal limits  RAPID URINE DRUG SCREEN, HOSP PERFORMED - Abnormal; Notable for the following components:   Tetrahydrocannabinol POSITIVE (*)    All other components within normal limits  ETHANOL  CBG MONITORING, ED    EKG EKG Interpretation  Date/Time:  Sunday December 11 2022 16:38:49 EST Ventricular Rate:  59 PR Interval:  217 QRS Duration: 96 QT Interval:  416 QTC Calculation: 413 R Axis:   49 Text Interpretation: Sinus rhythm Prolonged PR interval Confirmed by Dene Gentry [phone]) on 12/11/2022 4:44:54 PM  Radiology No results found.  Procedures Procedures    Medications Ordered in ED Medications  lacosamide (VIMPAT) tablet 200 mg (has no administration in time range)    ED Course/ Medical Decision Making/ A&P                             Medical Decision Making Amount and/or Complexity of Data Reviewed Labs: ordered.  Risk Prescription drug management.    Medical Screen Complete  This patient presented to the ED with complaint of seizure.  This complaint involves an extensive number of treatment options. The initial differential diagnosis includes, but is not limited to, case noncompliance, metabolic abnormality, etc.  This presentation is: Acute, Chronic, Self-Limited, Previously Undiagnosed, Uncertain Prognosis, Complicated, Systemic Symptoms, and Threat to Life/Bodily Function  Patient with known history of seizure disorder.  Patient reports seizure that occurred earlier today.  This was unwitnessed.  Patient reports that he has been out of his Vimpat for quite some time.  Patient otherwise without acute complaint.  Patient given evening dose of Vimpat.  Screening labs obtained are without significant abnormality.  Patient without issue during observation in the ED.    Patient desires discharge.  He understands need for close outpatient follow-up.  He  understands need to take his Vimpat as previously prescribed.  Patient receives most of his medications from [place].  Patient provided with Vimpat prescription since he reports that this would help him be compliant.    Additional history obtained:  External records from outside sources obtained and reviewed including prior ED visits and prior Inpatient records.    Lab Tests:  I ordered and personally interpreted labs.  The pertinent results include: CBC, BMP, urine tox, EtOH  Cardiac Monitoring:  The patient was maintained on a cardiac monitor.  I personally viewed and interpreted the cardiac monitor which showed an underlying rhythm of: NSR   Medicines ordered:  I ordered medication including vimpat  for seizure prophylaxis Reevaluation of the patient after these medicines showed that the patient: improved   Problem List / ED Course:  Reported breakthrough seizure, medication noncompliance   Reevaluation:  After the interventions noted above, I reevaluated the patient and found that they have: improved  Disposition:  After consideration of the diagnostic results and the patients response to treatment, I feel that the patent would benefit from close outpatient followup.          Final Clinical Impression(s) / ED Diagnoses Final diagnoses:  Seizure disorder [facility])    Rx / DC Orders ED Discharge Orders          Ordered    lacosamide (VIMPAT) 200 MG TABS tablet  2 times daily        12/11/22 1855    lacosamide (VIMPAT) 200 MG TABS tablet  2 times daily        12/11/22 1856              Valarie Merino, MD 12/11/22 1901

## 2022-12-11 NOTE — ED Triage Notes (Addendum)
Pt coming from hotel via EMS with c/o 2 seizures today. No witnessed seizures. Patient states that he has been out of his Vimpat x3 months. Pt currently A&Ox4.

## 2022-12-11 NOTE — Discharge Instructions (Signed)
Return for any problem.   Take Vimpat as previously prescribed.

## 2022-12-12 ENCOUNTER — Other Ambulatory Visit (HOSPITAL_COMMUNITY): Payer: Self-pay

## 2022-12-12 ENCOUNTER — Other Ambulatory Visit: Payer: Self-pay

## 2022-12-19 ENCOUNTER — Other Ambulatory Visit (HOSPITAL_COMMUNITY): Payer: Self-pay

## 2022-12-20 ENCOUNTER — Other Ambulatory Visit (HOSPITAL_COMMUNITY): Payer: Self-pay

## 2022-12-20 NOTE — ED Notes (Signed)
Pt called to clarify medications he received during his hospital visit.

## 2022-12-21 ENCOUNTER — Other Ambulatory Visit (HOSPITAL_COMMUNITY): Payer: Self-pay
